# Patient Record
Sex: Male | Born: 1955
Health system: Southern US, Community
[De-identification: ages and names within clinical notes are randomized; demographics above are authoritative.]

## PROBLEM LIST (undated history)

## (undated) DIAGNOSIS — G4730 Sleep apnea, unspecified: Secondary | ICD-10-CM

## (undated) DIAGNOSIS — I517 Cardiomegaly: Secondary | ICD-10-CM

## (undated) DIAGNOSIS — K5730 Diverticulosis of large intestine without perforation or abscess without bleeding: Secondary | ICD-10-CM

## (undated) DIAGNOSIS — M109 Gout, unspecified: Secondary | ICD-10-CM

## (undated) DIAGNOSIS — I739 Peripheral vascular disease, unspecified: Secondary | ICD-10-CM

## (undated) DIAGNOSIS — I779 Disorder of arteries and arterioles, unspecified: Secondary | ICD-10-CM

## (undated) DIAGNOSIS — I10 Essential (primary) hypertension: Secondary | ICD-10-CM

## (undated) DIAGNOSIS — T7840XA Allergy, unspecified, initial encounter: Secondary | ICD-10-CM

## (undated) DIAGNOSIS — J449 Chronic obstructive pulmonary disease, unspecified: Secondary | ICD-10-CM

## (undated) DIAGNOSIS — R9430 Abnormal result of cardiovascular function study, unspecified: Secondary | ICD-10-CM

## (undated) DIAGNOSIS — I77810 Thoracic aortic ectasia: Secondary | ICD-10-CM

## (undated) DIAGNOSIS — I2510 Atherosclerotic heart disease of native coronary artery without angina pectoris: Secondary | ICD-10-CM

## (undated) DIAGNOSIS — M1990 Unspecified osteoarthritis, unspecified site: Secondary | ICD-10-CM

## (undated) DIAGNOSIS — N419 Inflammatory disease of prostate, unspecified: Secondary | ICD-10-CM

## (undated) DIAGNOSIS — E785 Hyperlipidemia, unspecified: Secondary | ICD-10-CM

## (undated) DIAGNOSIS — K219 Gastro-esophageal reflux disease without esophagitis: Secondary | ICD-10-CM

## (undated) DIAGNOSIS — J439 Emphysema, unspecified: Secondary | ICD-10-CM

## (undated) DIAGNOSIS — I5189 Other ill-defined heart diseases: Secondary | ICD-10-CM

## (undated) DIAGNOSIS — I77819 Aortic ectasia, unspecified site: Secondary | ICD-10-CM

## (undated) DIAGNOSIS — F419 Anxiety disorder, unspecified: Secondary | ICD-10-CM

## (undated) HISTORY — DX: Unspecified osteoarthritis, unspecified site: M19.90

## (undated) HISTORY — DX: Emphysema, unspecified: J43.9

## (undated) HISTORY — DX: Allergy, unspecified, initial encounter: T78.40XA

## (undated) HISTORY — PX: JOINT REPLACEMENT: SHX530

## (undated) HISTORY — DX: Hyperlipidemia, unspecified: E78.5

## (undated) HISTORY — DX: Gastro-esophageal reflux disease without esophagitis: K21.9

## (undated) HISTORY — DX: Other ill-defined heart diseases: I51.89

## (undated) HISTORY — PX: KNEE ARTHROSCOPY: SUR90

## (undated) HISTORY — DX: Inflammatory disease of prostate, unspecified: N41.9

## (undated) HISTORY — DX: Sleep apnea, unspecified: G47.30

## (undated) HISTORY — DX: Atherosclerotic heart disease of native coronary artery without angina pectoris: I25.10

## (undated) HISTORY — DX: Essential (primary) hypertension: I10

## (undated) HISTORY — DX: Diverticulosis of large intestine without perforation or abscess without bleeding: K57.30

## (undated) HISTORY — DX: Anxiety disorder, unspecified: F41.9

## (undated) HISTORY — PX: OTHER SURGICAL HISTORY: SHX169

## (undated) HISTORY — DX: Aortic ectasia, unspecified site: I77.819

## (undated) HISTORY — DX: Peripheral vascular disease, unspecified: I73.9

## (undated) HISTORY — DX: Cardiomegaly: I51.7

## (undated) HISTORY — DX: Disorder of arteries and arterioles, unspecified: I77.9

## (undated) HISTORY — DX: Chronic obstructive pulmonary disease, unspecified: J44.9

## (undated) HISTORY — DX: Abnormal result of cardiovascular function study, unspecified: R94.30

## (undated) HISTORY — DX: Thoracic aortic ectasia: I77.810

## (undated) HISTORY — PX: WISDOM TOOTH EXTRACTION: SHX21

---

## 2001-05-02 DIAGNOSIS — E785 Hyperlipidemia, unspecified: Secondary | ICD-10-CM | POA: Insufficient documentation

## 2001-05-02 DIAGNOSIS — I10 Essential (primary) hypertension: Secondary | ICD-10-CM | POA: Insufficient documentation

## 2001-05-29 ENCOUNTER — Ambulatory Visit (HOSPITAL_COMMUNITY): Admission: RE | Admit: 2001-05-29 | Discharge: 2001-05-29 | Payer: Self-pay | Admitting: Cardiology

## 2003-12-31 DIAGNOSIS — K449 Diaphragmatic hernia without obstruction or gangrene: Secondary | ICD-10-CM | POA: Insufficient documentation

## 2003-12-31 DIAGNOSIS — K219 Gastro-esophageal reflux disease without esophagitis: Secondary | ICD-10-CM | POA: Insufficient documentation

## 2003-12-31 DIAGNOSIS — D126 Benign neoplasm of colon, unspecified: Secondary | ICD-10-CM | POA: Insufficient documentation

## 2003-12-31 DIAGNOSIS — K5730 Diverticulosis of large intestine without perforation or abscess without bleeding: Secondary | ICD-10-CM | POA: Insufficient documentation

## 2003-12-31 HISTORY — PX: COLONOSCOPY: SHX174

## 2004-03-09 ENCOUNTER — Ambulatory Visit: Payer: Self-pay | Admitting: Internal Medicine

## 2004-03-20 ENCOUNTER — Ambulatory Visit: Payer: Self-pay | Admitting: Family Medicine

## 2004-07-05 ENCOUNTER — Ambulatory Visit: Payer: Self-pay | Admitting: Cardiology

## 2005-03-28 ENCOUNTER — Ambulatory Visit: Payer: Self-pay | Admitting: Family Medicine

## 2005-05-01 ENCOUNTER — Ambulatory Visit: Payer: Self-pay | Admitting: Internal Medicine

## 2005-05-01 LAB — CONVERTED CEMR LAB: PSA: 0.75 ng/mL

## 2005-07-02 ENCOUNTER — Ambulatory Visit: Payer: Self-pay | Admitting: Family Medicine

## 2005-07-15 ENCOUNTER — Ambulatory Visit: Payer: Self-pay | Admitting: Cardiology

## 2005-08-06 ENCOUNTER — Ambulatory Visit: Payer: Self-pay

## 2005-12-16 ENCOUNTER — Ambulatory Visit: Payer: Self-pay | Admitting: Family Medicine

## 2006-03-07 ENCOUNTER — Ambulatory Visit: Payer: Self-pay | Admitting: Family Medicine

## 2006-05-12 ENCOUNTER — Ambulatory Visit: Payer: Self-pay | Admitting: Family Medicine

## 2006-05-12 LAB — CONVERTED CEMR LAB
Cholesterol: 144 mg/dL (ref 0–200)
LDL Cholesterol: 79 mg/dL (ref 0–99)
Total CHOL/HDL Ratio: 4.2

## 2006-07-15 ENCOUNTER — Ambulatory Visit: Payer: Self-pay | Admitting: Cardiology

## 2006-08-15 ENCOUNTER — Encounter (INDEPENDENT_AMBULATORY_CARE_PROVIDER_SITE_OTHER): Payer: Self-pay | Admitting: Internal Medicine

## 2006-08-15 ENCOUNTER — Encounter: Admission: RE | Admit: 2006-08-15 | Discharge: 2006-08-15 | Payer: Self-pay | Admitting: Allergy and Immunology

## 2007-01-23 ENCOUNTER — Encounter (INDEPENDENT_AMBULATORY_CARE_PROVIDER_SITE_OTHER): Payer: Self-pay | Admitting: *Deleted

## 2007-01-26 ENCOUNTER — Ambulatory Visit: Payer: Self-pay | Admitting: Family Medicine

## 2007-01-26 DIAGNOSIS — T887XXA Unspecified adverse effect of drug or medicament, initial encounter: Secondary | ICD-10-CM | POA: Insufficient documentation

## 2007-02-17 ENCOUNTER — Telehealth (INDEPENDENT_AMBULATORY_CARE_PROVIDER_SITE_OTHER): Payer: Self-pay | Admitting: Internal Medicine

## 2007-03-12 ENCOUNTER — Encounter (INDEPENDENT_AMBULATORY_CARE_PROVIDER_SITE_OTHER): Payer: Self-pay | Admitting: Internal Medicine

## 2007-03-24 ENCOUNTER — Telehealth (INDEPENDENT_AMBULATORY_CARE_PROVIDER_SITE_OTHER): Payer: Self-pay | Admitting: Internal Medicine

## 2007-05-15 ENCOUNTER — Ambulatory Visit: Payer: Self-pay | Admitting: Family Medicine

## 2007-05-15 DIAGNOSIS — J441 Chronic obstructive pulmonary disease with (acute) exacerbation: Secondary | ICD-10-CM | POA: Insufficient documentation

## 2007-05-22 ENCOUNTER — Encounter (INDEPENDENT_AMBULATORY_CARE_PROVIDER_SITE_OTHER): Payer: Self-pay | Admitting: Internal Medicine

## 2007-05-22 DIAGNOSIS — R011 Cardiac murmur, unspecified: Secondary | ICD-10-CM | POA: Insufficient documentation

## 2007-05-22 DIAGNOSIS — Z87448 Personal history of other diseases of urinary system: Secondary | ICD-10-CM | POA: Insufficient documentation

## 2007-05-22 DIAGNOSIS — R1310 Dysphagia, unspecified: Secondary | ICD-10-CM | POA: Insufficient documentation

## 2007-08-06 ENCOUNTER — Encounter (INDEPENDENT_AMBULATORY_CARE_PROVIDER_SITE_OTHER): Payer: Self-pay | Admitting: Internal Medicine

## 2007-08-06 ENCOUNTER — Ambulatory Visit: Payer: Self-pay | Admitting: Family Medicine

## 2007-08-07 LAB — CONVERTED CEMR LAB
ALT: 33 units/L (ref 0–53)
AST: 26 units/L (ref 0–37)
CO2: 30 meq/L (ref 19–32)
Chloride: 106 meq/L (ref 96–112)
Cholesterol: 217 mg/dL (ref 0–200)
Creatinine, Ser: 1 mg/dL (ref 0.4–1.5)
Direct LDL: 139.1 mg/dL
PSA: 0.81 ng/mL (ref 0.10–4.00)
TSH: 1.91 microintl units/mL (ref 0.35–5.50)
Total CHOL/HDL Ratio: 5.8

## 2007-08-18 ENCOUNTER — Ambulatory Visit: Payer: Self-pay | Admitting: Emergency Medicine

## 2007-08-18 DIAGNOSIS — T783XXA Angioneurotic edema, initial encounter: Secondary | ICD-10-CM | POA: Insufficient documentation

## 2007-09-17 ENCOUNTER — Ambulatory Visit: Payer: Self-pay | Admitting: Emergency Medicine

## 2007-10-01 ENCOUNTER — Ambulatory Visit: Payer: Self-pay | Admitting: Cardiology

## 2007-11-03 ENCOUNTER — Ambulatory Visit: Payer: Self-pay | Admitting: Family Medicine

## 2007-11-03 ENCOUNTER — Encounter (INDEPENDENT_AMBULATORY_CARE_PROVIDER_SITE_OTHER): Payer: Self-pay | Admitting: Internal Medicine

## 2007-11-09 LAB — CONVERTED CEMR LAB
AST: 24 units/L (ref 0–37)
Alkaline Phosphatase: 44 units/L (ref 39–117)
Bilirubin, Direct: 0.1 mg/dL (ref 0.0–0.3)
Total Bilirubin: 0.9 mg/dL (ref 0.3–1.2)
Total CHOL/HDL Ratio: 3.7
Triglycerides: 183 mg/dL — ABNORMAL HIGH (ref 0–149)

## 2007-11-16 ENCOUNTER — Encounter: Payer: Self-pay | Admitting: Emergency Medicine

## 2007-11-16 ENCOUNTER — Ambulatory Visit: Payer: Self-pay

## 2007-11-26 ENCOUNTER — Ambulatory Visit: Payer: Self-pay | Admitting: Cardiology

## 2007-12-09 ENCOUNTER — Encounter (INDEPENDENT_AMBULATORY_CARE_PROVIDER_SITE_OTHER): Payer: Self-pay | Admitting: Internal Medicine

## 2008-01-28 ENCOUNTER — Ambulatory Visit: Payer: Self-pay | Admitting: Family Medicine

## 2008-02-15 ENCOUNTER — Ambulatory Visit: Payer: Self-pay | Admitting: Family Medicine

## 2008-03-11 ENCOUNTER — Telehealth (INDEPENDENT_AMBULATORY_CARE_PROVIDER_SITE_OTHER): Payer: Self-pay | Admitting: Internal Medicine

## 2008-11-10 ENCOUNTER — Ambulatory Visit: Payer: Self-pay | Admitting: Family Medicine

## 2008-11-10 DIAGNOSIS — F411 Generalized anxiety disorder: Secondary | ICD-10-CM | POA: Insufficient documentation

## 2008-11-11 LAB — CONVERTED CEMR LAB
AST: 22 units/L (ref 0–37)
BUN: 10 mg/dL (ref 6–23)
CO2: 30 meq/L (ref 19–32)
Calcium: 9.3 mg/dL (ref 8.4–10.5)
Creatinine, Ser: 1 mg/dL (ref 0.4–1.5)
Glucose, Bld: 86 mg/dL (ref 70–99)
LDL Cholesterol: 81 mg/dL (ref 0–99)
PSA: 0.82 ng/mL (ref 0.10–4.00)
Total CHOL/HDL Ratio: 4
VLDL: 36.8 mg/dL (ref 0.0–40.0)

## 2009-01-12 ENCOUNTER — Ambulatory Visit: Payer: Self-pay | Admitting: Family Medicine

## 2009-03-30 ENCOUNTER — Telehealth (INDEPENDENT_AMBULATORY_CARE_PROVIDER_SITE_OTHER): Payer: Self-pay | Admitting: Internal Medicine

## 2009-05-24 ENCOUNTER — Encounter (INDEPENDENT_AMBULATORY_CARE_PROVIDER_SITE_OTHER): Payer: Self-pay | Admitting: *Deleted

## 2009-07-19 ENCOUNTER — Ambulatory Visit: Payer: Self-pay | Admitting: Family Medicine

## 2010-04-17 ENCOUNTER — Encounter: Payer: Self-pay | Admitting: Cardiology

## 2010-04-18 ENCOUNTER — Encounter: Payer: Self-pay | Admitting: Cardiology

## 2010-04-18 ENCOUNTER — Ambulatory Visit
Admission: RE | Admit: 2010-04-18 | Discharge: 2010-04-18 | Payer: Self-pay | Source: Home / Self Care | Attending: Cardiology | Admitting: Cardiology

## 2010-05-01 NOTE — Assessment & Plan Note (Signed)
Summary: 30 MIN/BILLIE'S PT/COUGH X 3 WKS/CLE   Vital Signs:  Patient profile:   55 year old male Height:      68.75 inches Weight:      208.50 pounds BMI:     31.13 Temp:     99.3 degrees F oral Pulse rate:   80 / minute Pulse rhythm:   regular BP sitting:   140 / 72  (left arm) Cuff size:   regular  Vitals Entered By: Linde Gillis CMA Duncan Dull) (July 19, 2009 3:17 PM) CC: cough for three weeks   History of Present Illness: 55 yo with h/o COPD with cough x 3 weeks. Started with runny nose, chills, subjective fever, dry cough. Now has productive, hacking cough, wheezing. Using his albuterol inhaler multiple times a day over last week. Ran out of Spiriva.  No CP, SOB. No sinus pressure or ear pain.  Current Medications (verified): 1)  Metoprolol Tartrate 50 Mg Tabs (Metoprolol Tartrate) .... Take One Tablet By Mouth Twice A Day 2)  Zoloft 50 Mg  Tabs (Sertraline Hcl) .... Take One By Mouth Once A Day 3)  Nitroglycerin 0.4 Mg  Subl (Nitroglycerin) .... As Needed 4)  Zyrtec Allergy 10 Mg  Tabs (Cetirizine Hcl) .[name].. 1 At Bedtime 5)  Spiriva Handihaler 18 Mcg  Caps (Tiotropium Bromide Monohydrate) .[name].. 1 Puff Once Daily 6)  Ventolin Hfa 108 (90 Base) Mcg/act  Aers (Albuterol Sulfate) .... Inhale 2 Puffs Every 4 Hours As Needed 7)  Zocor 40 Mg  Tabs (Simvastatin) .... Take 1 Tablet By Mouth Once A Day 8)  Aspirin 81 Mg  Tabs (Aspirin) .... One Daily 9)  Azithromycin 250 Mg  Tabs (Azithromycin) .... 2 By  Mouth Today and Then 1 Daily For 4 Days 10)  Cheratussin Ac 100-10 Mg/74ml Syrp (Guaifenesin-Codeine) .... 5 Ml At Bedtime As Needed Cough  Allergies: 1)  ! Aspirin 2)  ! Septra (Sulfamethoxazole-Trimethoprim)  Review of Systems      See HPI General:  Complains of chills and fever. CV:  Denies chest pain or discomfort. Resp:  Complains of cough, sputum productive, and wheezing; denies shortness of breath.  Physical Exam  General:  alert, well-developed, well-nourished,  well-hydrated, and overweight-appearing.  NAD Head:  normocephalic and atraumatic Eyes:  pupils equal, pupils round, and no injection.   Ears:  R ear normal and L ear normal.   Nose:  boggy turbinates Mouth:  pharyngeal erythema.   Lungs:  normal respiratory effort, no intercostal retractions, no accessory muscle use,  moist harsh cough with scattered exp wheezes Heart:  normal rate, regular rhythm, and no murmur.   Extremities:  no edema either lower legs Psych:  normally interactive and good eye contact.     Impression & Recommendations:  Problem # 1:  COPD (ICD-496) Assessment Deteriorated Given change in cough, more productive sputum with wheezing and h/o COPD, will treat with Zpack. Refilled Spriva and Albuterol. I would like for him to follow up if no improvement of symptoms in next 5 days sooner if he spikes a temp. Discussed given his history, may need to go to something like Avelox.  His updated medication list for this problem includes:    Spiriva Handihaler 18 Mcg Caps (Tiotropium bromide monohydrate) .[name]... 1 puff once daily    Ventolin Hfa 108 (90 Base) Mcg/act Aers (Albuterol sulfate) ..... Inhale 2 puffs every 4 hours as needed  Complete Medication List: 1)  Metoprolol Tartrate 50 Mg Tabs (Metoprolol tartrate) .... Take one tablet by  mouth twice a day 2)  Zoloft 50 Mg Tabs (Sertraline hcl) .... Take one by mouth once a day 3)  Nitroglycerin 0.4 Mg Subl (Nitroglycerin) .... As needed 4)  Zyrtec Allergy 10 Mg Tabs (Cetirizine hcl) .[name].. 1 at bedtime 5)  Spiriva Handihaler 18 Mcg Caps (Tiotropium bromide monohydrate) .[name].. 1 puff once daily 6)  Ventolin Hfa 108 (90 Base) Mcg/act Aers (Albuterol sulfate) .... Inhale 2 puffs every 4 hours as needed 7)  Zocor 40 Mg Tabs (Simvastatin) .... Take 1 tablet by mouth once a day 8)  Aspirin 81 Mg Tabs (Aspirin) .... One daily 9)  Azithromycin 250 Mg Tabs (Azithromycin) .... 2 by  mouth today and then 1 daily for 4 days 10)   Cheratussin Ac 100-10 Mg/12ml Syrp (Guaifenesin-codeine) .... 5 ml at bedtime as needed cough Prescriptions: CHERATUSSIN AC 100-10 MG/5ML SYRP (GUAIFENESIN-CODEINE) 5 ml at bedtime as needed cough  #4 ounces x 0   Entered and Authorized by:   Ruthe Mannan MD   Signed by:   Ruthe Mannan MD on 07/19/2009   Method used:   Print then Give to Patient   RxID:   [id number] AZITHROMYCIN 250 MG  TABS (AZITHROMYCIN) 2 by  mouth today and then 1 daily for 4 days  #6 x 0   Entered and Authorized by:   Ruthe Mannan MD   Signed by:   Ruthe Mannan MD on 07/19/2009   Method used:   Electronically to        [facility]* (retail)       [address]       [place], [place]  [address]       Ph: [phone]       Fax: [phone]   RxID:   [id number] VENTOLIN HFA 108 (90 BASE) MCG/ACT  AERS (ALBUTEROL SULFATE) inhale 2 puffs every 4 hours as needed  #1 x 11   Entered and Authorized by:   Ruthe Mannan MD   Signed by:   Ruthe Mannan MD on 07/19/2009   Method used:   Electronically to        [facility]* (retail)       [address]       [place], [place]  [address]       Ph: [phone]       Fax: [phone]   RxID:   [id number] SPIRIVA HANDIHALER 18 MCG  CAPS (TIOTROPIUM BROMIDE MONOHYDRATE) 1 puff once daily  #30 x 11   Entered and Authorized by:   Ruthe Mannan MD   Signed by:   Ruthe Mannan MD on 07/19/2009   Method used:   Electronically to        [facility]* (retail)       [address]       [place], [place]  [address]       Ph: [phone]       Fax: [phone]   RxID:   [id number]  ] Current Allergies (reviewed today): ! ASPIRIN ! SEPTRA (SULFAMETHOXAZOLE-TRIMETHOPRIM)  Last TD:  Td (04/21/2001 8:33:02 AM) TD Result Date:  06/21/2009 TD Result:  historical TD Next Due:  10 yr Flex Sig Next Due:  Not Indicated Hemoccult Next Due:  Not Indicated

## 2010-05-01 NOTE — Letter (Signed)
Summary: Appointment - Reminder 2  [facility], Main Office  [address]   [place], [place] [address]   Phone: [phone]  Fax: [phone]     May 24, 2009 MRN: [id number]   Hayden Deleon [address] [place], [place]  [address]   Dear [name],  Our records indicate that it is time to schedule a follow-up appointment with Dr. Myrtis Ser. It is very important that we reach you to schedule this appointment. We look forward to participating in your health care needs. Please contact us at the number listed above at your earliest convenience to schedule your appointment.  If you are unable to make an appointment at this time, give [place] a call so we can update our records.     Sincerely,   Glass blower/designer

## 2010-05-03 ENCOUNTER — Ambulatory Visit (INDEPENDENT_AMBULATORY_CARE_PROVIDER_SITE_OTHER): Payer: BC Managed Care – PPO | Admitting: Family Medicine

## 2010-05-03 ENCOUNTER — Other Ambulatory Visit (INDEPENDENT_AMBULATORY_CARE_PROVIDER_SITE_OTHER): Payer: BC Managed Care – PPO

## 2010-05-03 ENCOUNTER — Encounter (INDEPENDENT_AMBULATORY_CARE_PROVIDER_SITE_OTHER): Payer: BC Managed Care – PPO

## 2010-05-03 ENCOUNTER — Encounter: Payer: Self-pay | Admitting: Family Medicine

## 2010-05-03 ENCOUNTER — Ambulatory Visit: Admit: 2010-05-03 | Payer: Self-pay

## 2010-05-03 ENCOUNTER — Encounter: Payer: Self-pay | Admitting: Cardiology

## 2010-05-03 ENCOUNTER — Other Ambulatory Visit: Payer: Self-pay | Admitting: Cardiology

## 2010-05-03 DIAGNOSIS — H8110 Benign paroxysmal vertigo, unspecified ear: Secondary | ICD-10-CM

## 2010-05-03 DIAGNOSIS — Z0000 Encounter for general adult medical examination without abnormal findings: Secondary | ICD-10-CM

## 2010-05-03 DIAGNOSIS — E782 Mixed hyperlipidemia: Secondary | ICD-10-CM | POA: Insufficient documentation

## 2010-05-03 DIAGNOSIS — R0989 Other specified symptoms and signs involving the circulatory and respiratory systems: Secondary | ICD-10-CM

## 2010-05-03 DIAGNOSIS — I6529 Occlusion and stenosis of unspecified carotid artery: Secondary | ICD-10-CM

## 2010-05-03 LAB — LIPID PANEL
Cholesterol: 170 mg/dL (ref 0–200)
HDL: 41.6 mg/dL
LDL Cholesterol: 98 mg/dL (ref 0–99)
Total CHOL/HDL Ratio: 4
Triglycerides: 154 mg/dL — ABNORMAL HIGH (ref 0.0–149.0)
VLDL: 30.8 mg/dL (ref 0.0–40.0)

## 2010-05-03 LAB — PSA: PSA: 1.06 ng/mL (ref 0.10–4.00)

## 2010-05-03 NOTE — Miscellaneous (Signed)
  Clinical Lists Changes  Observations: Added new observation of PAST MED HX: GERD (05/30/2000) Hyperlipidemia (05/02/2001) Hypertension (05/02/2001) COPD  significant.... Dr. Delton Coombes  Diverticulosis, colon (12/31/2003) Prostatitis CAD   occlusion OM1, good collaterals.. catheterization... 2003  /   nuclear... August, 2009... low risk EF 60%   multiple nuclear scans (04/17/2010 17:38) Added new observation of PRIMARY MD: Hetty Ely (04/17/2010 17:38)       Past History:  Past Medical History: GERD (05/30/2000) Hyperlipidemia (05/02/2001) Hypertension (05/02/2001) COPD  significant.... Dr. Delton Coombes  Diverticulosis, colon (12/31/2003) Prostatitis CAD   occlusion OM1, good collaterals.. catheterization... 2003  /   nuclear... August, 2009... low risk EF 60%   multiple nuclear scans

## 2010-05-03 NOTE — Assessment & Plan Note (Signed)
Summary: follow up/mt  Medications Added METOPROLOL TARTRATE 50 MG TABS (METOPROLOL TARTRATE) Take one tablet by mouth once daily MULTIVITAMINS   TABS (MULTIPLE VITAMIN) once daily VITAMIN C 500 MG  TABS (ASCORBIC ACID) once daily      Allergies Added:   Visit Type:  Follow-up Primary Provider:  Hetty Ely  CC:  CAD.  History of Present Illness: The patient is seen for followup of coronary artery disease.  I saw him last in August, 2009.  He had occlusion of OM one with good collaterals in the catheterization in 2003.  He had an exercise test in 2009 was low risk.  His ejection fraction has been normal during his studies over time.  He has pulmonary disease with chronic shortness of breath.  This has been stable.  He is on cholesterol medications.  His labs have not been checked recently.  He is not having any significant chest pain.  Current Medications (verified): 1)  Metoprolol Tartrate 50 Mg Tabs (Metoprolol Tartrate) .... Take One Tablet By Mouth Once Daily 2)  Zoloft 50 Mg  Tabs (Sertraline Hcl) .... Take One By Mouth Once A Day 3)  Zyrtec Allergy 10 Mg  Tabs (Cetirizine Hcl) .[name].. 1 At Bedtime 4)  Spiriva Handihaler 18 Mcg  Caps (Tiotropium Bromide Monohydrate) .[name].. 1 Puff Once Daily 5)  Zocor 40 Mg  Tabs (Simvastatin) .... Take 1 Tablet By Mouth Once A Day 6)  Aspirin 81 Mg  Tabs (Aspirin) .... One Daily 7)  Multivitamins   Tabs (Multiple Vitamin) .... Once Daily 8)  Vitamin C 500 Mg  Tabs (Ascorbic Acid) .... Once Daily  Allergies (verified): 1)  ! Aspirin 2)  ! Septra  Past History:  Past Medical History: GERD (05/30/2000) Hyperlipidemia (05/02/2001) Hypertension (05/02/2001) COPD  significant.... Dr. Delton Coombes  Diverticulosis, colon (12/31/2003) Prostatitis CAD   occlusion OM1, good collaterals.. catheterization... 2003  /   nuclear... August, 2009... low risk EF 60%   multiple nuclear scans Question soft right carotid bruit  Review of Systems       Patient denies  fever, chills, headache, sweats, rash, change in vision, change in hearing, chest pain, cough, nausea vomiting, urinary symptoms.  All other systems are reviewed and are negative.  Vital Signs:  Patient profile:   55 year old male Height:      68.75 inches Weight:      230 pounds BMI:     34.34 Pulse rate:   71 / minute BP sitting:   126 / 68  (left arm) Cuff size:   regular  Vitals Entered By: Hardin Negus, RMA (April 18, 2010 4:26 PM)  Physical Exam  General:  patient is stable today. Head:  head is atraumatic. Eyes:  no xanthelasma. Neck:  no jugular venous distention.  Question soft right carotid bruit. Chest Wall:  no chest wall tenderness. Lungs:  lungs are clear.  Respiratory effort is nonlabored. Heart:  cardiac exam reveals S1-S2.  There is a 2/6 systolic murmur. Abdomen:  abdomen is soft. Msk:  no musculoskeletal deformities. Extremities:  no peripheral edema. Skin:  no skin rashes. Psych:  patient is oriented to person time and place.  Affect is normal.   Impression & Recommendations:  Problem # 1:  * QUESTION SOFT RIGHT CAROTID BRUIT There is question of a soft right carotid bruit.  The patient has known vascular disease.  He is never had a carotid Doppler.  This will be scheduled.  Problem # 2:  CAD (ICD-414.00)  The following  medications were removed from the medication list:    Nitroglycerin 0.4 Mg Subl (Nitroglycerin) .[name]... As needed His updated medication list for this problem includes:    Metoprolol Tartrate 50 Mg Tabs (Metoprolol tartrate) .[name]... Take one tablet by mouth once daily    Aspirin 81 Mg Tabs (Aspirin) ..... One daily  Orders: EKG w/ Interpretation (93000) Coronary disease appears to be stable at this time.  EKG is done today and reviewed by me.  It is normal.  His last exercise study was done in 2009.  He does not need a followup study at this time.  Problem # 3:  SYSTOLIC MURMUR (ZYS-063.2)  The following medications were removed  from the medication list:    Nitroglycerin 0.4 Mg Subl (Nitroglycerin) .[name]... As needed His updated medication list for this problem includes:    Metoprolol Tartrate 50 Mg Tabs (Metoprolol tartrate) .[name]... Take one tablet by mouth once daily There is a soft systolic murmur.  He does not need an echo.  Problem # 4:  HYPERTENSION (ICD-401.9)  His updated medication list for this problem includes:    Metoprolol Tartrate 50 Mg Tabs (Metoprolol tartrate) .[name]... Take one tablet by mouth once daily    Aspirin 81 Mg Tabs (Aspirin) ..... One daily Blood pressure is controlled.  No change in therapy.  Problem # 5:  HYPERLIPIDEMIA (ICD-272.4)  His updated medication list for this problem includes:    Zocor 40 Mg Tabs (Simvastatin) .[name]... Take 1 tablet by mouth once a day The patient needs a fasting lipid profile.  This will be arranged.  Was seen back in 2 years for cardiology followup will be in touch with him about his lipids and his carotid Doppler.  Other Orders: Carotid Duplex (Carotid Duplex)  Patient Instructions: 1)  Your physician recommends that you return for a FASTING lipid profile: 272.2 2)  Your physician has requested that you have a carotid duplex. This test is an ultrasound of the carotid arteries in your neck. It looks at blood flow through these arteries that supply the brain with blood. Allow one hour for this exam. There are no restrictions or special instructions. 3)  Your physician wants you to follow-up in: 2 years.  You will receive a reminder letter in the mail two months in advance. If you don't receive a letter, please call our office to schedule the follow-up appointment. Prescriptions: ZOCOR 40 MG  TABS (SIMVASTATIN) Take 1 tablet by mouth once a day  #30 x 12   Entered by:   Meredith Staggers, RN   Authorized by:   Talitha Givens, MD, [facility]   Signed by:   Meredith Staggers, RN on 04/18/2010   Method used:   Electronically to        [facility]* (retail)       [address]       [place], [place]  [address]       Ph: [phone]       Fax: [phone]   RxID:   [id number]

## 2010-05-09 NOTE — Assessment & Plan Note (Signed)
Summary: DIZZINESS / LFW   Vital Signs:  Patient profile:   55 year old male Height:      68.75 inches Weight:      227.50 pounds BMI:     33.96 Temp:     98.8 degrees F oral Pulse rate:   71 / minute Pulse rhythm:   regular BP sitting:   130 / 80  (right arm) Cuff size:   regular  Vitals Entered By: Linde Gillis CMA Duncan Dull) (May 03, 2010 12:22 PM) CC: dizziness   History of Present Illness: 55 yo here for several days of dizziness.  Only dizzy when he changes positions.  Particually bad when he is laying in bed and turns his head to one side. He can alleviate the dizziness if he looks forward and closes his eyes, usually resolves within minutes.  Never had anything like this before.  No nausea or vomiting. NO headaches, no CP, no SOB.    Had some URI  congestion last week.  Current Medications (verified): 1)  Metoprolol Tartrate 50 Mg Tabs (Metoprolol Tartrate) .... Take One Tablet By Mouth Once Daily 2)  Zoloft 50 Mg  Tabs (Sertraline Hcl) .... Take One By Mouth Once A Day 3)  Zyrtec Allergy 10 Mg  Tabs (Cetirizine Hcl) .[name].. 1 At Bedtime 4)  Spiriva Handihaler 18 Mcg  Caps (Tiotropium Bromide Monohydrate) .[name].. 1 Puff Once Daily 5)  Zocor 40 Mg  Tabs (Simvastatin) .... Take 1 Tablet By Mouth Once A Day 6)  Aspirin 81 Mg  Tabs (Aspirin) .... One Daily 7)  Multivitamins   Tabs (Multiple Vitamin) .... Once Daily 8)  Vitamin C 500 Mg  Tabs (Ascorbic Acid) .... Once Daily 9)  Meclizine Hcl 25 Mg Tabs (Meclizine Hcl) .[name].. 1 By Mouth Two Times A Day As Needed Vertigo  Allergies: 1)  ! Aspirin 2)  ! Septra  Past History:  Past Medical History: Last updated: 04/18/2010 GERD (05/30/2000) Hyperlipidemia (05/02/2001) Hypertension (05/02/2001) COPD  significant.... Dr. Delton Coombes  Diverticulosis, colon (12/31/2003) Prostatitis CAD   occlusion OM1, good collaterals.. catheterization... 2003  /   nuclear... August, 2009... low risk EF 60%   multiple nuclear scans Question  soft right carotid bruit  Past Surgical History: Last updated: 12/09/2007 persantine cardiolite wnl EF 63% 06/11/2001 CATH-- occlusion OM I good collaterals EF 63%-- 05/29/2001 Adenosine cardiolite --neg ischemia-- EF 68% -- 06/14/2002 Stress test --neg exercise ---08/06/2005 EGD 12/2003 Colonoscopy 10/05 Adenosine nuclear stresstest--11/16/2007---EF 68%--low risk  Family History: Last updated: 08/18/2007 mother-angina  Social History: Last updated: 08/18/2007 Patient states former smoker. Pt quit smoking in 2002.  Smoked x 32 years upto 2ppd. Pt is married, no children. Pt is a Production designer, theatre/television/film at [employer].  Risk Factors: Caffeine Use: 2 (05/15/2007) Exercise: no (05/15/2007)  Risk Factors: Smoking Status: quit (08/18/2007) Passive Smoke Exposure: no (05/15/2007)  Review of Systems      See HPI Eyes:  Denies blurring. GI:  Denies nausea and vomiting.  Physical Exam  General:  alert, well-developed, well-nourished, well-hydrated, and overweight-appearing.  NAD Eyes:  pos dix hallpike Ears:  External ear exam shows no significant lesions or deformities.  Otoscopic examination reveals clear canals, tympanic membranes are intact bilaterally without bulging, retraction, inflammation or discharge. Hearing is grossly normal bilaterally. Lungs:  normal respiratory effort, no intercostal retractions, no accessory muscle use,  moist harsh cough with scattered exp wheezes Heart:  normal rate, regular rhythm, and no murmur.   Neurologic:  No cranial nerve deficits noted. Station  and gait are normal. Plantar reflexes are down-going bilaterally. DTRs are symmetrical throughout. Sensory, motor and coordinative functions appear intact. Psych:  normally interactive and good eye contact.     Impression & Recommendations:  Problem # 1:  BENIGN POSITIONAL VERTIGO (ICD-386.11) Assessment New Discussed causes of vertigo and tx options with pt. Try meclizine, if no improvement  will need referral to ENT in order to rule out more serious causea of vertigo and for possible maneuvers. Pt to call next week with an update. His updated medication list for this problem includes:    Zyrtec Allergy 10 Mg Tabs (Cetirizine hcl) .[name]... 1 at bedtime    Meclizine Hcl 25 Mg Tabs (Meclizine hcl) .[name]... 1 by mouth two times a day as needed vertigo  Complete Medication List: 1)  Metoprolol Tartrate 50 Mg Tabs (Metoprolol tartrate) .... Take one tablet by mouth once daily 2)  Zoloft 50 Mg Tabs (Sertraline hcl) .... Take one by mouth once a day 3)  Zyrtec Allergy 10 Mg Tabs (Cetirizine hcl) .[name].. 1 at bedtime 4)  Spiriva Handihaler 18 Mcg Caps (Tiotropium bromide monohydrate) .[name].. 1 puff once daily 5)  Zocor 40 Mg Tabs (Simvastatin) .... Take 1 tablet by mouth once a day 6)  Aspirin 81 Mg Tabs (Aspirin) .... One daily 7)  Multivitamins Tabs (Multiple vitamin) .... Once daily 8)  Vitamin C 500 Mg Tabs (Ascorbic acid) .... Once daily 9)  Meclizine Hcl 25 Mg Tabs (Meclizine hcl) .[name].. 1 by mouth two times a day as needed vertigo Prescriptions: MECLIZINE HCL 25 MG TABS (MECLIZINE HCL) 1 by mouth two times a day as needed vertigo  #60 x 0   Entered and Authorized by:   Ruthe Mannan MD   Signed by:   Ruthe Mannan MD on 05/03/2010   Method used:   Electronically to        [facility]* (retail)       [address]       [place], [place]  [address]       Ph: [phone]       Fax: [phone]   RxID:   [id number]    Orders Added: 1)  Est. Patient Level IV [02725]    Current Allergies (reviewed today): ! ASPIRIN ! SEPTRA

## 2010-05-10 ENCOUNTER — Telehealth: Payer: Self-pay | Admitting: Cardiology

## 2010-05-17 NOTE — Progress Notes (Signed)
Summary: test results     Medications Added ATORVASTATIN CALCIUM 40 MG TABS (ATORVASTATIN CALCIUM) Take one by mouth daily       Phone Note Outgoing Call   Call placed by: Meredith Staggers, RN,  May 10, 2010 5:19 PM Summary of Call: attempted to call pt w/lab and carotid u/s results, home #busy Left message to call back on work VM  Follow-up for Phone Call        PT RETURNING CALL FOR TEST RESULTS. [phone] Hayden Deleon  May 11, 2010 9:05 AM Left message to call back Dossie Arbour, RN, BSN  May 11, 2010 9:22 AM Pt. given results of carotid.  Pt also given results of lab work. He is willing to start generic Lipitor 40 mg by mouth daily. He is aware to stop Simvastatin and to return week of June 25, 2010 for fasting lab work Follow-up by: Dossie Arbour, RN, BSN,  May 11, 2010 11:07 AM    New/Updated Medications: ATORVASTATIN CALCIUM 40 MG TABS (ATORVASTATIN CALCIUM) Take one by mouth daily Prescriptions: ATORVASTATIN CALCIUM 40 MG TABS (ATORVASTATIN CALCIUM) Take one by mouth daily  #30 x 6   Entered by:   Dossie Arbour, RN, BSN   Authorized by:   Talitha Givens, MD, [facility]   Signed by:   Dossie Arbour, RN, BSN on 05/11/2010   Method used:   Electronically to        [facility]* (retail)       [address]       [place], [place]  [address]       Ph: [phone]       Fax: [phone]   RxID:   [id number]

## 2010-06-25 ENCOUNTER — Other Ambulatory Visit: Payer: BC Managed Care – PPO | Admitting: *Deleted

## 2010-06-25 ENCOUNTER — Other Ambulatory Visit (INDEPENDENT_AMBULATORY_CARE_PROVIDER_SITE_OTHER): Payer: BC Managed Care – PPO

## 2010-06-25 DIAGNOSIS — E7800 Pure hypercholesterolemia, unspecified: Secondary | ICD-10-CM

## 2010-06-25 LAB — HEPATIC FUNCTION PANEL
ALT: 25 U/L (ref 0–53)
AST: 20 U/L (ref 0–37)
Albumin: 4 g/dL (ref 3.5–5.2)
Alkaline Phosphatase: 50 U/L (ref 39–117)
Bilirubin, Direct: 0.1 mg/dL (ref 0.0–0.3)
Total Bilirubin: 0.8 mg/dL (ref 0.3–1.2)
Total Protein: 6.4 g/dL (ref 6.0–8.3)

## 2010-06-25 LAB — LIPID PANEL
Cholesterol: 142 mg/dL (ref 0–200)
HDL: 41.1 mg/dL (ref >39.00)
VLDL: 23.6 mg/dL (ref 0.0–40.0)

## 2010-06-27 ENCOUNTER — Encounter: Payer: Self-pay | Admitting: *Deleted

## 2010-07-12 ENCOUNTER — Ambulatory Visit (INDEPENDENT_AMBULATORY_CARE_PROVIDER_SITE_OTHER): Payer: BC Managed Care – PPO | Admitting: Family Medicine

## 2010-07-12 ENCOUNTER — Encounter: Payer: Self-pay | Admitting: Family Medicine

## 2010-07-12 DIAGNOSIS — J40 Bronchitis, not specified as acute or chronic: Secondary | ICD-10-CM

## 2010-07-12 LAB — HM SIGMOIDOSCOPY

## 2010-07-12 LAB — HM COLONOSCOPY

## 2010-07-12 MED ORDER — MOXIFLOXACIN HCL 400 MG PO TABS: 400.0000 mg | ORAL_TABLET | Freq: Every day | ORAL | Status: AC

## 2010-07-12 NOTE — Progress Notes (Signed)
SUBJECTIVE:  Hayden Deleon is a 54 y.o. male who complains of congestion, sneezing, sore throat, swollen glands, nasal blockage, post nasal drip, night sweats, myalgias, fever and wheezing for 3 days. He denies a history of chest pain and shortness of breath and has a history of asthma. Patient denies smoke cigarettes.   OBJECTIVE: BP 150/80  Pulse 98  Temp(Src) 99.5 F (37.5 C) (Oral)  Ht 5\' 10"  (1.778 m)  Wt 232 lb 1.9 oz (105.289 kg)  BMI 33.31 kg/m2  SpO2 95%  He appears well, vital signs are as noted. Ears normal.  Throat and pharynx normal.  Neck supple. No adenopathy in the neck. Nose is congested. Sinuses non tender. Diffuse exp wheezes, no crackles- pt declines nebs in office.     The PMH, PSH, Social History, Family History, Medications, and allergies have been reviewed in [facility], and have been updated if relevant.  ASSESSMENT:  viral upper respiratory illness and bronchitis  PLAN: Symptomatic therapy suggested: push fluids, rest and return office visit prn if symptoms persist or worsen. Given h/o COPD, will treat with Avelox, albuterol sample given. Call or return to clinic prn if these symptoms worsen or fail to improve as anticipated.

## 2010-07-13 ENCOUNTER — Telehealth: Payer: Self-pay | Admitting: *Deleted

## 2010-07-13 MED ORDER — DOXYCYCLINE HYCLATE 100 MG PO CAPS: 100.0000 mg | ORAL_CAPSULE | Freq: Two times a day (BID) | ORAL | Status: AC

## 2010-07-13 NOTE — Telephone Encounter (Signed)
Doxy x 7 days sent in to [facility]  Any trouble breathing or rash or swelling of throat?  Benadryl for swelling of face, if worsening may need to seek urgent care over weekend.

## 2010-07-13 NOTE — Telephone Encounter (Signed)
Pt has been taking avelox for 2 days.  He states one side of his face has been swollen today.  Per Dr. Sharen Hones advised pt to stop taking avelox and doxycycline would be called in for him.  [facility].

## 2010-07-24 ENCOUNTER — Other Ambulatory Visit: Payer: Self-pay | Admitting: *Deleted

## 2010-07-24 NOTE — Telephone Encounter (Signed)
Patient saw you on 07/12/10 and was given Avelox which he had a reaction to  and it was changed to Doxycycline. Patient finished the antibiotic Sunday and is now feeling that he is getting sick again. Patient complains of chest congestion, chills, but no fever. Patient wants to know if you will send in another rx for the Doxycycline? [facility]  Patient is aware that Dr. Dayton Martes is out of the office this afternoon.

## 2010-07-25 MED ORDER — DOXYCYCLINE HYCLATE 100 MG PO CAPS: 100.0000 mg | ORAL_CAPSULE | Freq: Two times a day (BID) | ORAL | Status: AC

## 2010-07-25 NOTE — Telephone Encounter (Signed)
Patient advised as instructed via telephone. 

## 2010-07-25 NOTE — Telephone Encounter (Signed)
Will send in 5 more days of doxy.

## 2010-08-13 ENCOUNTER — Other Ambulatory Visit: Payer: Self-pay | Admitting: *Deleted

## 2010-08-13 MED ORDER — TIOTROPIUM BROMIDE MONOHYDRATE 18 MCG IN CAPS: 18.0000 ug | ORAL_CAPSULE | Freq: Every day | RESPIRATORY_TRACT | Status: DC

## 2010-08-14 NOTE — Assessment & Plan Note (Signed)
[facility]                            CARDIOLOGY OFFICE NOTE   Hayden Deleon, Hayden Deleon                    MRN:          [id number]  DATE:10/01/2007                            DOB:          October 05, 1955    [name] is here for cardiology followup.  He does note that when  climbing stairs after approximately two flights, he does get some  shortness of breath and he may have chest tightness.  He is not having  symptoms at any other time.  He has documented coronary disease.  He has  left-to-left collaterals with disease of first OM and 60%-70% right  coronary lesion.  His Myoview scan in 2007 showed good ejection fraction  and no ischemia.  When we tried to walk him on the treadmill in the  past, he did not get a good heart rate response and therefore adenosine  will be used again.   The patient has COPD and he is being followed by Dr. Levy Pupa.   Also, the patient has had swelling including tongue swelling and  ultimately he saw allergy.  There has been no documentation of any type  of definite allergen causing the problems.  Zyrtec seems to help him.  We had stopped his cholesterol med at that time.  He can be restarted on  Zocor.  He had labs recently through [employer] and we will obtain  these.   ALLERGIES:  1. The patient had upset stomach from ASPIRIN in the past, but he      tolerates low-dose aspirin.  2. Question of SULFA.   MEDICATIONS:  1. Lopressor 50.  2. Aspirin 81.  3. Zoloft 50.  4. Vistaril as needed.  5. Zyrtec at nighttime.   OTHER MEDICAL PROBLEMS:  See the list below.   REVIEW OF SYSTEMS:  He is feeling well and doing well.  As mentioned,  per the HPI, he has some shortness of breath.  Otherwise, his review of  systems is negative.   PHYSICAL EXAMINATION:  VITAL SIGNS:  Weight is 224 pounds.  This is up a  few pounds.  He knows that he needs to lose weight and we talked about  this.  Blood pressure is 104/64 with  a pulse of 54.  GENERAL:  The patient is oriented to person, time, and place.  Affect is  normal.  HEENT:  Reveals no xanthelasma.  He has normal extraocular motion.  NECK:  There are no carotid bruits.  There is no jugular venous  distention.  LUNGS:  Clear.  Respiratory effort is not labored.  CARDIAC:  Exam reveals S1 and S2.  There are no clicks or significant  murmurs.  ABDOMEN:  Obese but soft.  EXTREMITIES:  He has no peripheral edema.  MUSCULOSKELETAL:  There are no musculoskeletal deformities.   EKG reveals no significant change.   PROBLEMS:  1. Hypertension.  Blood pressure is good today.  2. History of prostatitis.  3. Gastroesophageal reflux disease.  4. Question of SULFA allergy.  5. Hyperlipidemia.  We definitely need to get him back on simvastatin,  which will be done and then he will have followup labs.  6. Question of a soft systolic murmur in the past.  I do not hear      significant murmurs today.  7. Coronary disease.  The patient is having some exertional shortness      of breath.  This may be anginal.  We need to proceed with an      adenosine Myoview and then see him back to discuss the findings      further.  We will not have him walk on the treadmill because he did      not increase his heart rate adequately by history.  I am not      changing his cardiac medications as of today.  8. History of significant chronic obstructive pulmonary disease      followed by Dr. Levy Pupa.  9. History of some type of allergic reaction with swelling in his      tongue.  Etiology is not clear.  There had been no proven antigens.      Some Zyrtec at night seems to help him.  I feel it is safe for [place]      to put him back on Zocor.     Luis Abed, MD, [facility]  Electronically Signed    JDK/MedQ  DD: 10/01/2007  DT: 10/02/2007  Job #: [id number]   cc:   Billie D. Bean, FNP  Leslye Peer, MD

## 2010-08-14 NOTE — Assessment & Plan Note (Signed)
[facility]                            CARDIOLOGY OFFICE NOTE   Hayden Deleon, Hayden Deleon                    MRN:          [id number]  DATE:11/26/2007                            DOB:          13-Dec-1955    Mr. Hayden Deleon is seen for followup.  See my note of October 01, 2007.  At that  time, he was having some exertional shortness of breath.  We decided to  proceed with a Myoview scan since he does have history of coronary  artery disease.  He had an adenosine Myoview scan.  It showed question  of an old scar at the base the inferior wall, but no definite ischemia.  This is stable for him.  I think it is most likely that his shortness is  pulmonary.  He is overweight and he knows it and as of today, he does  seem motivated to try to start bringing it down.   PAST MEDICAL HISTORY:   ALLERGIES:  He has had stomach upset from ASPIRIN in the past, but he  tolerates low-dose aspirin.  There is question of a SULFA allergy.   MEDICATIONS:  1. Simvastatin 40.  2. Lopressor 50.  3. Aspirin 81.  4. Zoloft 50.  5. Zyrtec 10.   OTHER MEDICAL PROBLEMS:  See the complete list on my note of October 01, 2007.   REVIEW OF SYSTEMS:  He feels fine today and his review of systems is  negative.   PHYSICAL EXAMINATION:  VITAL SIGNS:  Weight is 229 pounds.  Blood  pressure 114/62 with a pulse of 66.  GENERAL:  The patient is oriented to person, time, and place.  Affect is  normal.  HEENT:  No xanthelasma.  He has normal extraocular motion.  There are no  carotid bruits.  There is no jugular venous distention.  LUNGS:  Clear.  Respiratory effort is not labored.  CARDIAC:  S1 and S2.  There is no clicks or significant murmurs.  ABDOMEN:  Soft.  There is no peripheral edema.   No other labs were done today.  As outlined, he had a Myoview scan since  his last visit.   PROBLEMS:  Listed on my note of October 01, 2007:  #7.  Coronary disease.  This is stable.  His exertional  shortness of  breath is probably not ischemic in origin.  I will see him back in 1  year.    Luis Abed, MD, [facility]  Electronically Signed   JDK/MedQ  DD: 11/26/2007  DT: 11/27/2007  Job #: [id number]

## 2010-08-17 NOTE — Cardiovascular Report (Signed)
This transcription did not belong to this patient.  Please disregard.

## 2010-08-17 NOTE — Cardiovascular Report (Signed)
[place]  Patient:    Hayden Deleon, Hayden Deleon Visit Number: [id number] MRN: [id number]          Service Type: CAT Location: [facility] 2853 [id number] Attending Physician:  Rollene Rotunda Dictated by:   Rollene Rotunda, M.D. [facility] Proc. Date: 05/29/01 Admit Date:  05/29/2001   CC:         Laurita Quint, M.D. [facility]  Luis Abed, M.D. [facility]   Cardiac Catheterization  DATE OF BIRTH:  03-13-1956  PROCEDURE:  Left heart catheterization/coronary arteriography.  CARDIOLOGIST:  Rollene Rotunda, M.D. [facility]  INDICATION:  Evaluate patient with exertional angina.  PROCEDURAL NOTE:  Left heart catheterization was performed via the right femoral artery.  The artery was cannulated using anterior wall puncture.  A #6 French arterial sheath was inserted via the modified Seldinger technique. Preformed Judkins and a pigtail catheter were utilized.  The patient tolerated the procedure well and left the lab in stable condition.  RESULTS:  HEMODYNAMICS:  LV: 90/21, AO: 87/53.  CORONARIES:  The left main was normal.  Left anterior descending artery:  The LAD had a long proximal 25% stenosis involving a second diagonal.  The second diagonal was a moderate size vessel with ostial 40% stenosis.  The circumflex in the A-V groove had scattered 25% lesions and a 40% at the takeoff of a first marginal.  The first marginal was occluded and seen to fill via left-to-left collaterals.  The second marginal was large with luminal irregularities.  The right coronary artery was a large dominant vessel.  There was a 60 to 70% stenosis in the proximal segment followed by a 60% stenosis before the RV branch and long 40% stenosis after the RV branch.  There was long 60% stenosis before the PDA.  This vessel was looked at in multiple views before and after nitrates.  LEFT VENTRICULOGRAM:  Left ventriculogram was obtained in the RAO projection. The EF was 65% with normal wall  motion.  CONCLUSION: 1. Severe single-vessel coronary artery disease with apparent nonobstructive    disease elsewhere. 2. Normal left ventriculogram.  PLAN:  The patient will be managed aggressively with secondary risk factor modification.  He will need a perfusion study to further evaluate the hemodynamic significance of the RCA. Dictated by:   Rollene Rotunda, M.D. [facility] Attending Physician:  Rollene Rotunda DD:  05/29/01 TD:  05/29/01 Job: [id number]

## 2010-08-17 NOTE — Assessment & Plan Note (Signed)
[facility]                            CARDIOLOGY OFFICE NOTE   TILMAN, MCCLAREN                    MRN:          [id number]  DATE:07/15/2006                            DOB:          [birth year]    Hayden Deleon is seen for cardiology followup. He is not having any  significant chest pain. He is not having any significant shortness of  breath. It is of note, however, that he is having some type of allergic  reaction that is very concerning. At first, he started having areas of  swelling with itching on various places on his feet. More recently, he  has had several episodes of tongue swelling, both his entire tongue and  half of his tongue. He was given Vistaril by a primary physician. He has  had some shortness of breath with the tongue swelling.   He is on Altace and has been on Altace since 2007. However, he could  still be allergic to this and it is being stopped immediately.   PAST MEDICAL HISTORY:   ALLERGIES:  QUESTION OF ALLERGY TO SULFA AND NEW ALLERGIES THAT ARE  UNEXPLAINED.   MEDICATIONS:  1. Lopressor 50.  2. Aspirin 81.  3. Zoloft 50 (to be stopped today).  4. Zocor (to be stopped today).  5. P.r.n. Vistaril.   OTHER MEDICAL PROBLEMS:  See the list below.   REVIEW OF SYSTEMS:  He really is not having any other significant  symptoms. His review of systems otherwise is negative.   PHYSICAL EXAMINATION:  The patient weighs 216 pounds. Blood pressure is  105/76. His pulse is 58. The patient is oriented to person, time and  place. Affect is normal.  HEENT: Reveals no xanthelasma. He has normal extraocular motion. He has  no carotid bruits. There is no jugular venous distention.  LUNGS:  Are clear. Respiratory effort is not labored.  CARDIAC: Reveals an S1, with an S2. There are no clicks or significant  murmurs.  ABDOMEN: Soft. He has  no masses or bruits.  He has no significant peripheral edema.   EKG is normal with some  mild sinus bradycardia.   PROBLEMS:  1. Hypertension. His blood pressure is actually on the low side today.      I am very comfortable stopping his Altace and following him for a      period of time.  2. History of prostatitis.  3. Gastroesophageal reflux disease.  4. SULFA ALLERGY.  5. Hyperlipidemia.  6. History of a soft systolic murmur in the past.  7. Coronary artery disease. The patient has left-to-left collaterals      with disease of the first obtuse marginal. He has a 60% to 70%      right coronary lesion. He has a Myoview scan done in 2007, that      showed no significant abnormalities and a  good ejection fraction.  8. History of some lung disease and he is treated with Spiriva.  9. Recent rash in the feet and tongue swelling. This is very      concerning and I am pushing to get  him Allergy evaluation very soon      paralleling our stopping his Altace and Zocor.   I will see him back for followup.     Luis Abed, MD, [facility]  Electronically Signed    JDK/MedQ  DD: 07/15/2006  DT: 07/15/2006  Job #: [id number]   cc:   Willaim Sheng D. Bean, FNP  Clinton D. Maple Hudson, MD, FCCP, FACP

## 2010-10-05 ENCOUNTER — Encounter: Payer: Self-pay | Admitting: Cardiology

## 2010-10-17 ENCOUNTER — Encounter (INDEPENDENT_AMBULATORY_CARE_PROVIDER_SITE_OTHER): Payer: Self-pay | Admitting: General Surgery

## 2010-10-17 ENCOUNTER — Encounter (INDEPENDENT_AMBULATORY_CARE_PROVIDER_SITE_OTHER): Payer: Self-pay | Admitting: Surgery

## 2010-10-17 ENCOUNTER — Other Ambulatory Visit: Payer: Self-pay | Admitting: *Deleted

## 2010-10-17 ENCOUNTER — Ambulatory Visit (INDEPENDENT_AMBULATORY_CARE_PROVIDER_SITE_OTHER): Payer: BC Managed Care – PPO | Admitting: Surgery

## 2010-10-17 VITALS — BP 156/92 | HR 68 | Temp 98.0°F | Ht 70.0 in | Wt 235.0 lb

## 2010-10-17 DIAGNOSIS — K4090 Unilateral inguinal hernia, without obstruction or gangrene, not specified as recurrent: Secondary | ICD-10-CM

## 2010-10-17 MED ORDER — TIOTROPIUM BROMIDE MONOHYDRATE 18 MCG IN CAPS: 18.0000 ug | ORAL_CAPSULE | Freq: Every day | RESPIRATORY_TRACT | Status: DC

## 2010-10-17 NOTE — Progress Notes (Signed)
The patient comes in today because of a right inguinal hernia. He's had this for some time and he denies any symptoms. Occasionally will get a twinge of pain. He denies any symptoms of obstruction. He does have COPD and coughs quite a bit. He does not travel internationally.  On physical exam he has a prominent bulge in a moderately overweight lower abdominal pannus. This is easily reducible. There is no left inguinal hernia.  I gave him a hernia book and told him that if he decides to have this repaired and I would do an open right inguinal hernia repair with mesh. I gave him a booklet on this and showed him how we would do it. He will decide if he wants to have this done. He may want to schedule her for late this year. I told him to call the office and schedule this. I would schedule him as an outpatient [facility] surgery under general anesthesia.

## 2011-02-27 ENCOUNTER — Other Ambulatory Visit: Payer: Self-pay | Admitting: Cardiology

## 2011-02-27 MED ORDER — METOPROLOL TARTRATE 50 MG PO TABS: 50.0000 mg | ORAL_TABLET | Freq: Two times a day (BID) | ORAL | Status: DC

## 2011-05-06 ENCOUNTER — Other Ambulatory Visit: Payer: Self-pay | Admitting: *Deleted

## 2011-05-06 MED ORDER — SERTRALINE HCL 50 MG PO TABS: 50.0000 mg | ORAL_TABLET | Freq: Every day | ORAL | Status: DC

## 2011-06-14 ENCOUNTER — Ambulatory Visit (INDEPENDENT_AMBULATORY_CARE_PROVIDER_SITE_OTHER): Payer: BC Managed Care – PPO | Admitting: Family Medicine

## 2011-06-14 ENCOUNTER — Encounter: Payer: Self-pay | Admitting: Family Medicine

## 2011-06-14 VITALS — BP 140/80 | HR 87 | Temp 98.1°F | Ht 70.0 in | Wt 236.0 lb

## 2011-06-14 DIAGNOSIS — J449 Chronic obstructive pulmonary disease, unspecified: Secondary | ICD-10-CM

## 2011-06-14 MED ORDER — ALBUTEROL SULFATE HFA 108 (90 BASE) MCG/ACT IN AERS: 2.0000 | INHALATION_SPRAY | Freq: Four times a day (QID) | RESPIRATORY_TRACT | Status: DC | PRN

## 2011-06-14 MED ORDER — DOXYCYCLINE HYCLATE 100 MG PO TABS: 100.0000 mg | ORAL_TABLET | Freq: Two times a day (BID) | ORAL | Status: AC

## 2011-06-14 NOTE — Progress Notes (Signed)
SUBJECTIVE:  Hayden Deleon is a 56 y.o. male who complains of congestion, sneezing, sore throat, swollen glands, nasal blockage, post nasal drip, night sweats, myalgias, fever and wheezing for 3 days. He denies a history of chest pain and shortness of breath and has a history of asthma. Patient denies smoke cigarettes.  Had an allergic rx to avelox last time- facial swelling.   Patient Active Problem List  Diagnoses  . COLONIC POLYPS  . HYPERLIPIDEMIA  . ANXIETY  . HYPERTENSION  . CAD  . COPD  . GASTROESOPHAGEAL REFLUX DISEASE, CHRONIC  . HIATAL HERNIA  . DIVERTICULOSIS, COLON  . SYSTOLIC MURMUR  . DYSPHAGIA UNSPECIFIED  . ANGIOEDEMA  . ADVEF, DRUG/MEDICINAL/BIOLOGICAL SUBST NOS  . PROSTATITIS, HX OF  . MIXED HYPERLIPIDEMIA  . BENIGN POSITIONAL VERTIGO   Past Medical History  Diagnosis Date  . Hyperlipidemia   . Hypertension   . COPD (chronic obstructive pulmonary disease)   . Diverticulosis of colon   . Prostatitis   . Blockage of coronary artery of heart     70%   Past Surgical History  Procedure Date  . Knee arthroscopy     right   History  Substance Use Topics  . Smoking status: Former Games developer  . Smokeless tobacco: Former Neurosurgeon  . Alcohol Use: No   Family History  Problem Relation Age of Onset  . Angina Mother    Allergies  Allergen Reactions  . Aspirin     REACTION: Nausea; fine with 81mg   . Sulfamethoxazole W/Trimethoprim     REACTION: sore mouth   Current Outpatient Prescriptions on File Prior to Visit  Medication Sig Dispense Refill  . Ascorbic Acid (VITAMIN C) 500 MG tablet Take 500 mg by mouth daily.        [name] aspirin 81 MG tablet Take 81 mg by mouth daily.        [name] atorvastatin (LIPITOR) 40 MG tablet Take 40 mg by mouth daily.        . cetirizine (ZYRTEC) 10 MG tablet Take 10 mg by mouth at bedtime.        . metoprolol (LOPRESSOR) 50 MG tablet Take 1 tablet (50 mg total) by mouth 2 (two) times daily.  60 tablet  6  . Multiple Vitamin  (MULTIVITAMIN) capsule Take 1 capsule by mouth daily.        . sertraline (ZOLOFT) 50 MG tablet Take 1 tablet (50 mg total) by mouth daily.  30 tablet  6  . tiotropium (SPIRIVA) 18 MCG inhalation capsule Place 1 capsule (18 mcg total) into inhaler and inhale daily.  30 capsule  6  . meclizine (ANTIVERT) 25 MG tablet Take 25 mg by mouth 2 (two) times daily as needed.         The PMH, PSH, Social History, Family History, Medications, and allergies have been reviewed in [facility], and have been updated if relevant.  OBJECTIVE: BP 140/80  Pulse 87  Temp(Src) 98.1 F (36.7 C) (Oral)  Ht 5\' 10"  (1.778 m)  Wt 236 lb (107.049 kg)  BMI 33.86 kg/m2  SpO2 93%  He appears well, vital signs are as noted. Ears normal.  Throat and pharynx normal.  Neck supple. No adenopathy in the neck. Nose is congested. Sinuses non tender. Diffuse exp wheezes, no crackles-left>right,  pt declines nebs in office.     The PMH, PSH, Social History, Family History, Medications, and allergies have been reviewed in [facility], and have been updated if relevant.  ASSESSMENT:  viral  upper respiratory illness and bronchitis  PLAN: Symptomatic therapy suggested: push fluids, rest and return office visit prn if symptoms persist or worsen. Given h/o COPD, Doxycycline x 10 days, albuterol refilled. Call or return to clinic prn if these symptoms worsen or fail to improve as anticipated.

## 2011-08-05 ENCOUNTER — Other Ambulatory Visit: Payer: Self-pay | Admitting: *Deleted

## 2011-08-05 MED ORDER — ATORVASTATIN CALCIUM 40 MG PO TABS: 40.0000 mg | ORAL_TABLET | Freq: Every day | ORAL | Status: DC

## 2011-08-05 NOTE — Telephone Encounter (Signed)
Refilled atorvastatin

## 2011-09-12 ENCOUNTER — Other Ambulatory Visit: Payer: Self-pay | Admitting: Cardiology

## 2011-09-12 DIAGNOSIS — I6529 Occlusion and stenosis of unspecified carotid artery: Secondary | ICD-10-CM

## 2011-09-13 ENCOUNTER — Encounter: Payer: Self-pay | Admitting: Cardiology

## 2011-09-13 DIAGNOSIS — J449 Chronic obstructive pulmonary disease, unspecified: Secondary | ICD-10-CM | POA: Insufficient documentation

## 2011-09-13 DIAGNOSIS — K219 Gastro-esophageal reflux disease without esophagitis: Secondary | ICD-10-CM | POA: Insufficient documentation

## 2011-09-13 DIAGNOSIS — I779 Disorder of arteries and arterioles, unspecified: Secondary | ICD-10-CM | POA: Insufficient documentation

## 2011-09-13 DIAGNOSIS — I739 Peripheral vascular disease, unspecified: Secondary | ICD-10-CM

## 2011-09-13 DIAGNOSIS — IMO0002 Reserved for concepts with insufficient information to code with codable children: Secondary | ICD-10-CM | POA: Insufficient documentation

## 2011-09-13 DIAGNOSIS — R9430 Abnormal result of cardiovascular function study, unspecified: Secondary | ICD-10-CM | POA: Insufficient documentation

## 2011-09-13 DIAGNOSIS — I2510 Atherosclerotic heart disease of native coronary artery without angina pectoris: Secondary | ICD-10-CM | POA: Insufficient documentation

## 2011-09-16 ENCOUNTER — Ambulatory Visit (INDEPENDENT_AMBULATORY_CARE_PROVIDER_SITE_OTHER): Payer: BC Managed Care – PPO | Admitting: Cardiology

## 2011-09-16 ENCOUNTER — Encounter (INDEPENDENT_AMBULATORY_CARE_PROVIDER_SITE_OTHER): Payer: BC Managed Care – PPO

## 2011-09-16 ENCOUNTER — Encounter: Payer: Self-pay | Admitting: Cardiology

## 2011-09-16 VITALS — BP 142/70 | HR 72 | Resp 12 | Ht 70.0 in | Wt 235.4 lb

## 2011-09-16 DIAGNOSIS — I6529 Occlusion and stenosis of unspecified carotid artery: Secondary | ICD-10-CM

## 2011-09-16 DIAGNOSIS — I2510 Atherosclerotic heart disease of native coronary artery without angina pectoris: Secondary | ICD-10-CM

## 2011-09-16 DIAGNOSIS — R0989 Other specified symptoms and signs involving the circulatory and respiratory systems: Secondary | ICD-10-CM

## 2011-09-16 DIAGNOSIS — I10 Essential (primary) hypertension: Secondary | ICD-10-CM

## 2011-09-16 DIAGNOSIS — E785 Hyperlipidemia, unspecified: Secondary | ICD-10-CM

## 2011-09-16 DIAGNOSIS — I779 Disorder of arteries and arterioles, unspecified: Secondary | ICD-10-CM

## 2011-09-16 MED ORDER — METOPROLOL TARTRATE 50 MG PO TABS: ORAL_TABLET | ORAL | Status: DC

## 2011-09-16 NOTE — Assessment & Plan Note (Signed)
The patient has had his lipids checked at work but not in our office within the year. He will be sending [place] the labs from work. He says that his lipids were excellent. He is on Lipitor 40 daily. This is new since last year. At that time he was on simvastatin 40 and his LDL was 72. No change in therapy.

## 2011-09-16 NOTE — Assessment & Plan Note (Signed)
Blood pressure is controlled. No change in therapy. 

## 2011-09-16 NOTE — Assessment & Plan Note (Signed)
Carotid Dopplers were done today. There has been some progression since the past. Followup is planned. We are pushing harder on his secondary prevention. However the only remaining issue is that of his weight. He does not smoke. He is nondiabetic. We are treating his cholesterol. His blood pressures controlled. I plan to see him back in 6 months.

## 2011-09-16 NOTE — Progress Notes (Signed)
HPI   The patient is seen for cardiology followup. I saw him last January, 2012. As part of today's evaluation I have reviewed all of his past cardiac history. I have updated the new electronic medical record. The patient has known coronary disease. Catheterization in 2003 revealed occlusion of OM 1 with good collaterals. Nuclear scan in August, 2009 was low risk. Ejection fraction was 60%.  The patient is active. He does have some generalized fatigue. He does that with rapid walking upstairs he can get some chest tightness. He stops and this is gone. This has been stable. There has been no change in the pattern. He has no rest pain. He is taking his medications including his lipid meds.    Allergies  Allergen Reactions  . Aspirin     REACTION: Nausea; fine with 81mg   . Avelox (Moxifloxacin Hcl In Nacl)     Facial swelling  . Sulfamethoxazole W-Trimethoprim     REACTION: sore mouth    Current Outpatient Prescriptions  Medication Sig Dispense Refill  . albuterol (PROVENTIL HFA;VENTOLIN HFA) 108 (90 BASE) MCG/ACT inhaler Inhale 2 puffs into the lungs every 6 (six) hours as needed for wheezing.  1 Inhaler  0  . aspirin 81 MG tablet Take 81 mg by mouth daily.        [name] atorvastatin (LIPITOR) 40 MG tablet Take 1 tablet (40 mg total) by mouth daily.  30 tablet  1  . cetirizine (ZYRTEC) 10 MG tablet Take 10 mg by mouth at bedtime.        . metoprolol (LOPRESSOR) 50 MG tablet Take 1 tablet (50 mg total) by mouth 2 (two) times daily.  60 tablet  6  . Multiple Vitamin (MULTIVITAMIN) capsule Take 1 capsule by mouth daily.        . sertraline (ZOLOFT) 50 MG tablet Take 1 tablet (50 mg total) by mouth daily.  30 tablet  6  . tiotropium (SPIRIVA) 18 MCG inhalation capsule Place 1 capsule (18 mcg total) into inhaler and inhale daily.  30 capsule  6  . Ascorbic Acid (VITAMIN C) 500 MG tablet Take 500 mg by mouth daily.        . meclizine (ANTIVERT) 25 MG tablet Take 25 mg by mouth 2 (two) times daily as  needed.          History   Social History  . Marital Status: Married    Spouse Name: N/A    Number of Children: 0  . Years of Education: N/A   Occupational History  . 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 Smoking status: Former Games developer  . Smokeless tobacco: Former Neurosurgeon  . Alcohol Use: No  . Drug Use: No  . Sexually Active:    Other Topics Concern  . Not on file   Social History Narrative  . No narrative on file    Family History  Problem Relation Age of Onset  . Angina Mother     Past Medical History  Diagnosis Date  . Hyperlipidemia   . Hypertension   . COPD (chronic obstructive pulmonary disease)     Significant Dr. Delton Coombes  . Diverticulosis of colon   . Prostatitis   . CAD (coronary artery disease)     Catheterization, 2003, occluded OM1, good collaterals  //   nuclear, August, 2009, low risk, EF 60%  . GERD (gastroesophageal reflux disease)   . Carotid artery disease     Doppler, February, 2012,  0-39% R. ICA, 40-59% LICA,  plan followup 1 year  . Ejection fraction     EF 60%, nuclear, 2009    Past Surgical History  Procedure Date  . Knee arthroscopy     right    ROS  Patient denies fever, chills, headache, sweats, rash, change in vision, change in hearing, cough, nausea vomiting, urinary symptoms. All the systems are reviewed and are negative.  PHYSICAL EXAM Patient is overweight. He stable. He is oriented to person time and place. Affect is normal. There is no jugular venous distention. Lungs are clear. Respiratory effort is nonlabored. Cardiac exam reveals S1 and S2. There no clicks or significant murmurs. The abdomen is soft. There's no peripheral edema. There no musculoskeletal deformities. There are no skin rashes.  Filed Vitals:   09/16/11 1415  BP: 142/70  Pulse: 72  Resp: 12  Height: 5\' 10"  (1.778 m)  Weight: 235 lb 6.4 oz (106.777 kg)   EKG is done today and reviewed by me. There is normal sinus rhythm.  There is no significant abnormality in the EKG.  ASSESSMENT & PLAN

## 2011-09-16 NOTE — Assessment & Plan Note (Signed)
The patient's last nuclear scan was August, 2009. There was no marked ischemia. He does describe some exertional angina at this time. It is reproducible at a certain level going up stairs. It has not changed over time. I recommended to him that we increase his beta blocker dose in the morning. He will see how he feels with this. If this helps we'll keep the dose at a higher level.

## 2011-09-16 NOTE — Patient Instructions (Addendum)
Your physician has recommended you make the following change in your medication: increase your lopressor to 75mg  every morning and 50mg  every pm.  Your physician wants you to follow-up in: 6 months.  You will receive a reminder letter in the mail two months in advance. If you don't receive a letter, please call our office to schedule the follow-up appointment.

## 2011-10-22 ENCOUNTER — Other Ambulatory Visit: Payer: Self-pay | Admitting: *Deleted

## 2011-10-22 MED ORDER — TIOTROPIUM BROMIDE MONOHYDRATE 18 MCG IN CAPS: 18.0000 ug | ORAL_CAPSULE | Freq: Every day | RESPIRATORY_TRACT | Status: DC

## 2011-12-20 ENCOUNTER — Other Ambulatory Visit: Payer: Self-pay | Admitting: *Deleted

## 2011-12-20 MED ORDER — ATORVASTATIN CALCIUM 40 MG PO TABS: 40.0000 mg | ORAL_TABLET | Freq: Every day | ORAL | Status: DC

## 2012-03-09 ENCOUNTER — Encounter: Payer: Self-pay | Admitting: *Deleted

## 2012-03-09 ENCOUNTER — Encounter: Payer: Self-pay | Admitting: Family Medicine

## 2012-03-09 ENCOUNTER — Ambulatory Visit (INDEPENDENT_AMBULATORY_CARE_PROVIDER_SITE_OTHER): Payer: BC Managed Care – PPO | Admitting: Family Medicine

## 2012-03-09 VITALS — BP 144/80 | HR 60 | Temp 98.1°F | Wt 233.0 lb

## 2012-03-09 DIAGNOSIS — Z125 Encounter for screening for malignant neoplasm of prostate: Secondary | ICD-10-CM

## 2012-03-09 DIAGNOSIS — Z23 Encounter for immunization: Secondary | ICD-10-CM

## 2012-03-09 DIAGNOSIS — E785 Hyperlipidemia, unspecified: Secondary | ICD-10-CM

## 2012-03-09 DIAGNOSIS — R0609 Other forms of dyspnea: Secondary | ICD-10-CM

## 2012-03-09 DIAGNOSIS — I2510 Atherosclerotic heart disease of native coronary artery without angina pectoris: Secondary | ICD-10-CM

## 2012-03-09 DIAGNOSIS — R0683 Snoring: Secondary | ICD-10-CM

## 2012-03-09 LAB — LIPID PANEL
HDL: 36.6 mg/dL — ABNORMAL LOW (ref >39.00)
Total CHOL/HDL Ratio: 3
VLDL: 22.8 mg/dL (ref 0.0–40.0)

## 2012-03-09 LAB — COMPREHENSIVE METABOLIC PANEL
AST: 18 U/L (ref 0–37)
Albumin: 3.9 g/dL (ref 3.5–5.2)
Alkaline Phosphatase: 52 U/L (ref 39–117)
BUN: 10 mg/dL (ref 6–23)
Creatinine, Ser: 1 mg/dL (ref 0.4–1.5)
Potassium: 4.5 mEq/L (ref 3.5–5.1)

## 2012-03-09 NOTE — Progress Notes (Signed)
Very pleasant 56 yo male with h/o CAD (followed by Dr. Myrtis Ser), HLD on atorvastatin here to discuss:  HLD-  Lab Results  Component Value Date   CHOL 142 06/25/2010   HDL 41.10 06/25/2010   LDLCALC 77 06/25/2010   LDLDIRECT 139.1 08/06/2007   TRIG 118.0 06/25/2010   CHOLHDL 3 06/25/2010   CAD- followed by Dr. Myrtis Ser who he last saw this summer.  Non smoker, non diabetic and he is active.  Cholesterol has been treated aggressively.  ? Check PSA-  Having no symptoms of difficulty starting or stopping his stream.  No family h/o prostate CA.  Has not been checked in a awhile.  Lab Results  Component Value Date   PSA 1.06 05/03/2010   PSA 0.82 11/10/2008   PSA 0.81 08/06/2007   ? Sleep apnea- wife says he snores and sometimes stops breathing in his sleep.  Does have h/o COPD- on spiriva and as needed albuterol. No CP or worsening SOB.     Allergies  Allergen Reactions  . Aspirin     REACTION: Nausea; fine with 81mg   . Avelox (Moxifloxacin Hcl In Nacl)     Facial swelling  . Sulfamethoxazole W-Trimethoprim     REACTION: sore mouth    Current Outpatient Prescriptions  Medication Sig Dispense Refill  . albuterol (PROVENTIL HFA;VENTOLIN HFA) 108 (90 BASE) MCG/ACT inhaler Inhale 2 puffs into the lungs every 6 (six) hours as needed for wheezing.  1 Inhaler  0  . Ascorbic Acid (VITAMIN C) 500 MG tablet Take 500 mg by mouth daily.        [name] aspirin 81 MG tablet Take 81 mg by mouth daily.        [name] atorvastatin (LIPITOR) 40 MG tablet Take 1 tablet (40 mg total) by mouth daily.  90 tablet  2  . cetirizine (ZYRTEC) 10 MG tablet Take 10 mg by mouth at bedtime.        . meclizine (ANTIVERT) 25 MG tablet Take 25 mg by mouth 2 (two) times daily as needed.        . metoprolol (LOPRESSOR) 50 MG tablet Take 75mg  (1 1/2 tabs) in the am and 50mg  (1 tab) in the pm  75 tablet  6  . Multiple Vitamin (MULTIVITAMIN) capsule Take 1 capsule by mouth daily.        . sertraline (ZOLOFT) 50 MG tablet Take 1 tablet (50 mg  total) by mouth daily.  30 tablet  6  . tiotropium (SPIRIVA) 18 MCG inhalation capsule Place 1 capsule (18 mcg total) into inhaler and inhale daily.  30 capsule  6    History   Social History  . Marital Status: Married    Spouse Name: N/A    Number of Children: 0  . Years of Education: N/A   Occupational History  . 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 Smoking status: Former Games developer  . Smokeless tobacco: Former Neurosurgeon  . Alcohol Use: No  . Drug Use: No  . Sexually Active:    Other Topics Concern  . Not on file   Social History Narrative  . No narrative on file    Family History  Problem Relation Age of Onset  . Angina Mother     Past Medical History  Diagnosis Date  . Hyperlipidemia   . Hypertension   . COPD (chronic obstructive pulmonary disease)     Significant Dr. Delton Coombes  . Diverticulosis of colon   .  Prostatitis   . CAD (coronary artery disease)     Catheterization, 2003, occluded OM1, good collaterals  //   nuclear, August, 2009, low risk, EF 60%  . GERD (gastroesophageal reflux disease)   . Carotid artery disease     Doppler, February, 2012, 0-39% R. ICA, 40-59% LICA,  plan followup 1 year  . Ejection fraction     EF 60%, nuclear, 2009    Past Surgical History  Procedure Date  . Knee arthroscopy     right    ROS  Patient denies fever, chills, headache, sweats, rash, change in vision, change in hearing, cough, nausea vomiting, urinary symptoms. All the systems are reviewed and are negative.  PHYSICAL EXAM BP 144/80  Pulse 60  Temp 98.1 F (36.7 C)  Wt 233 lb (105.688 kg) Wt Readings from Last 3 Encounters:  03/09/12 233 lb (105.688 kg)  09/16/11 235 lb 6.4 oz (106.777 kg)  06/14/11 236 lb (107.049 kg)      Patient is overweight. He stable. He is oriented to person time and place. Affect is normal. There is no jugular venous distention. Lungs are clear. Respiratory effort is nonlabored. Cardiac exam reveals S1  and S2. There no clicks or significant murmurs. The abdomen is soft. There's no peripheral edema. There no musculoskeletal deformities. There are no skin rashes.   ASSESSMENT & PLAN 1. HYPERLIPIDEMIA  On Lipitor 40 mg daily. Recheck lipids today and CMET.  Lipid Panel, Comprehensive metabolic panel  2. CAD (coronary artery disease)  Quiet, followed by cards. On ASA, lipitor.    3. Screening for prostate cancer  PSA  4. Snoring  ?sleep apnea. Will refer to pulmonary for sleep study/evaluation. Ambulatory referral to Pulmonology  5. Need for prophylactic vaccination and inoculation against influenza  Flu vaccine greater than or equal to 3yo preservative free IM

## 2012-03-09 NOTE — Patient Instructions (Addendum)
Good to see you. Have a wonderful Christmas. Please stop by to see Hayden Deleon after you go to the lab to set up your pulmonologist referral.

## 2012-03-26 ENCOUNTER — Ambulatory Visit: Payer: BC Managed Care – PPO | Admitting: Cardiology

## 2012-04-02 ENCOUNTER — Institutional Professional Consult (permissible substitution): Payer: BC Managed Care – PPO | Admitting: Pulmonary Disease

## 2012-04-23 ENCOUNTER — Ambulatory Visit: Payer: BC Managed Care – PPO | Admitting: Cardiology

## 2012-05-01 ENCOUNTER — Ambulatory Visit (INDEPENDENT_AMBULATORY_CARE_PROVIDER_SITE_OTHER): Payer: BC Managed Care – PPO | Admitting: Pulmonary Disease

## 2012-05-01 ENCOUNTER — Encounter: Payer: Self-pay | Admitting: Pulmonary Disease

## 2012-05-01 VITALS — BP 142/68 | HR 66 | Temp 98.0°F | Ht 71.0 in | Wt 238.6 lb

## 2012-05-01 DIAGNOSIS — G4733 Obstructive sleep apnea (adult) (pediatric): Secondary | ICD-10-CM

## 2012-05-01 DIAGNOSIS — J449 Chronic obstructive pulmonary disease, unspecified: Secondary | ICD-10-CM

## 2012-05-01 NOTE — Assessment & Plan Note (Signed)
Obtain spirometry in future to quantitate lung function. Takes Spiriva in a.m. to avoid dryness of mouth.

## 2012-05-01 NOTE — Patient Instructions (Addendum)
Schedule sleep study

## 2012-05-01 NOTE — Progress Notes (Signed)
Subjective:    Patient ID: Hayden Deleon, male    DOB: 06-07-1955, 58 y.o.   MRN: [id number]  HPI 57 year old ex-smoker presents for evaluation of obstructive sleep apnea. He reports loud snoring which can be heard by his wife at the other end of the house. He reports occasional gasping episodes that wake him up from sleep. Epworth sleepiness score is 6/24. He sleeps in a recliner for the last 5 years at a 30 angle. Bedtime is 11 PM, sleep latency is 5 minutes, he is several awakenings but denies bathroom visits. He is out of bed by 6:30 AM feeling tired with occasional dryness of mouth but denies morning headaches. He reports excessive daytime fatigue during the day and around 3 PM sitting in front of his computer feels like he's going to crash. He drinks to 20 ounce soda daily and occasionally has hot green tea. He is maintained on Spiriva. He reports congestion at night and therefore started taking Spiriva at night. Allergy testing in the past has led him to avoid daily products after 4 PM.  There is no history suggestive of cataplexy, sleep paralysis or parasomnias   Past Medical History  Diagnosis Date  . Hyperlipidemia   . Hypertension   . COPD (chronic obstructive pulmonary disease)     Significant Dr. Delton Coombes  . Diverticulosis of colon   . Prostatitis   . CAD (coronary artery disease)     Catheterization, 2003, occluded OM1, good collaterals  //   nuclear, August, 2009, low risk, EF 60%  . GERD (gastroesophageal reflux disease)   . Carotid artery disease     Doppler, February, 2012, 0-39% R. ICA, 40-59% LICA,  plan followup 1 year  . Ejection fraction     EF 60%, nuclear, 2009    Past Surgical History  Procedure Date  . Knee arthroscopy     right    Allergies  Allergen Reactions  . Aspirin     REACTION: Nausea; fine with 81mg   . Avelox (Moxifloxacin Hcl In Nacl)     Facial swelling  . Sulfamethoxazole W-Trimethoprim     REACTION: sore mouth    History    Social History  . Marital Status: Married    Spouse Name: N/A    Number of Children: 0  . Years of Education: N/A   Occupational History  . 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 Smoking status: Former Smoker -- 2.0 packs/day for 32 years    Types: Cigarettes    Quit date: 08/31/1999  . Smokeless tobacco: Former Neurosurgeon  . Alcohol Use: No  . Drug Use: No  . Sexually Active: Not on file   Other Topics Concern  . Not on file   Social History Narrative  . No narrative on file    Review of Systems  Constitutional: Negative for appetite change and unexpected weight change.  HENT: Positive for trouble swallowing. Negative for ear pain, congestion, sore throat, sneezing and dental problem.   Respiratory: Positive for cough and shortness of breath.   Cardiovascular: Negative for chest pain, palpitations and leg swelling.  Gastrointestinal: Negative for abdominal pain.  Musculoskeletal: Positive for arthralgias. Negative for joint swelling.  Skin: Negative for rash.  Neurological: Negative for headaches.  Psychiatric/Behavioral: Negative for dysphoric mood. The patient is not nervous/anxious.        Objective:   Physical Exam  Gen. Pleasant, obese, in no distress, normal affect ENT - no lesions, no  post nasal drip, class 3 airway Neck: No JVD, no thyromegaly, no carotid bruits Lungs: no use of accessory muscles, no dullness to percussion, decreased without rales or rhonchi  Cardiovascular: Rhythm regular, heart sounds  normal, no murmurs or gallops, no peripheral edema Abdomen: soft and non-tender, no hepatosplenomegaly, BS normal. Musculoskeletal: No deformities, no cyanosis or clubbing Neuro:  alert, non focal, no tremors       Assessment & Plan:

## 2012-05-01 NOTE — Assessment & Plan Note (Signed)
Given excessive daytime somnolence, narrow pharyngeal exam, witnessed apneas & loud snoring, obstructive sleep apnea is very likely & an overnight polysomnogram will be scheduled as a split study. The pathophysiology of obstructive sleep apnea , it's cardiovascular consequences & modes of treatment including CPAP were discused with the patient in detail & they evidenced understanding.  

## 2012-05-18 ENCOUNTER — Ambulatory Visit: Payer: BC Managed Care – PPO | Admitting: Cardiology

## 2012-05-20 ENCOUNTER — Ambulatory Visit (HOSPITAL_BASED_OUTPATIENT_CLINIC_OR_DEPARTMENT_OTHER): Payer: BC Managed Care – PPO | Attending: Pulmonary Disease | Admitting: Radiology

## 2012-05-20 VITALS — Ht 71.0 in | Wt 241.0 lb

## 2012-05-20 DIAGNOSIS — G4733 Obstructive sleep apnea (adult) (pediatric): Secondary | ICD-10-CM | POA: Insufficient documentation

## 2012-05-22 ENCOUNTER — Ambulatory Visit (INDEPENDENT_AMBULATORY_CARE_PROVIDER_SITE_OTHER): Payer: BC Managed Care – PPO | Admitting: Cardiology

## 2012-05-22 ENCOUNTER — Encounter: Payer: Self-pay | Admitting: Cardiology

## 2012-05-22 VITALS — BP 120/70 | HR 53 | Ht 71.0 in | Wt 236.4 lb

## 2012-05-22 DIAGNOSIS — F411 Generalized anxiety disorder: Secondary | ICD-10-CM

## 2012-05-22 DIAGNOSIS — I2510 Atherosclerotic heart disease of native coronary artery without angina pectoris: Secondary | ICD-10-CM

## 2012-05-22 DIAGNOSIS — J449 Chronic obstructive pulmonary disease, unspecified: Secondary | ICD-10-CM

## 2012-05-22 DIAGNOSIS — G4733 Obstructive sleep apnea (adult) (pediatric): Secondary | ICD-10-CM

## 2012-05-22 DIAGNOSIS — I10 Essential (primary) hypertension: Secondary | ICD-10-CM

## 2012-05-22 DIAGNOSIS — E785 Hyperlipidemia, unspecified: Secondary | ICD-10-CM

## 2012-05-22 DIAGNOSIS — J4489 Other specified chronic obstructive pulmonary disease: Secondary | ICD-10-CM

## 2012-05-22 DIAGNOSIS — I779 Disorder of arteries and arterioles, unspecified: Secondary | ICD-10-CM

## 2012-05-22 MED ORDER — ATORVASTATIN CALCIUM 40 MG PO TABS: 40.0000 mg | ORAL_TABLET | Freq: Every day | ORAL | Status: DC

## 2012-05-22 MED ORDER — METOPROLOL TARTRATE 50 MG PO TABS: ORAL_TABLET | ORAL | Status: DC

## 2012-05-22 NOTE — Progress Notes (Signed)
Patient ID: Hayden Deleon, male   DOB: 03/16/1956, 57 y.o.   MRN: [id number]   HPI  Patient is seen today to followup coronary disease. I saw him last in June, 2013. He has known coronary disease. Catheterization in 2003 revealed occlusion of OM 1 with good collaterals. Nuclear scan August, 2009 revealed no significant ischemia. When I talk to him last in June, he thought that he was having some tightness when walking. At that time we decided to increase his beta blocker.  More recently the patient has been seen by Dr. Vassie Loll for assessment of the possibility of sleep apnea. The results of his studies are pending. In addition the patient has had some congestion and feels that he has some shortness of breath from his lung disease.  The patient does note that he's had some chest tightness. In particular this may happen when he is exercising after he has eaten.  The patient also mentions that he has had some anxiety. In the past he took some Zoloft that did help.  Allergies  Allergen Reactions  . Aspirin     REACTION: Nausea; fine with 81mg   . Avelox (Moxifloxacin Hcl In Nacl)     Facial swelling  . Sulfamethoxazole W-Trimethoprim     REACTION: sore mouth    Current Outpatient Prescriptions  Medication Sig Dispense Refill  . albuterol (PROVENTIL HFA;VENTOLIN HFA) 108 (90 BASE) MCG/ACT inhaler Inhale 2 puffs into the lungs every 6 (six) hours as needed for wheezing.  1 Inhaler  0  . Ascorbic Acid (VITAMIN C) 500 MG tablet Take 500 mg by mouth daily.        [name] aspirin 81 MG tablet Take 81 mg by mouth daily.        [name] atorvastatin (LIPITOR) 40 MG tablet Take 1 tablet (40 mg total) by mouth daily.  90 tablet  2  . cetirizine (ZYRTEC) 10 MG tablet Take 10 mg by mouth at bedtime.        . metoprolol (LOPRESSOR) 50 MG tablet Take 75mg  (1 1/2 tabs) in the am and 50mg  (1 tab) in the pm  75 tablet  6  . Multiple Vitamin (MULTIVITAMIN) capsule Take 1 capsule by mouth daily.        [name] tiotropium  (SPIRIVA) 18 MCG inhalation capsule Place 18 mcg into inhaler and inhale daily.       No current facility-administered medications for this visit.    History   Social History  . Marital Status: Married    Spouse Name: N/A    Number of Children: 0  . Years of Education: N/A   Occupational History  . 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 Smoking status: Former Smoker -- 2.00 packs/day for 32 years    Types: Cigarettes    Quit date: 08/31/1999  . Smokeless tobacco: Former Neurosurgeon  . Alcohol Use: No  . Drug Use: No  . Sexually Active: Not on file   Other Topics Concern  . Not on file   Social History Narrative  . No narrative on file    Family History  Problem Relation Age of Onset  . Angina Mother     Past Medical History  Diagnosis Date  . Hyperlipidemia   . Hypertension   . COPD (chronic obstructive pulmonary disease)     Significant Dr. Delton Coombes  . Diverticulosis of colon   . Prostatitis   . CAD (coronary artery disease)  Catheterization, 2003, occluded OM1, good collaterals  //   nuclear, August, 2009, low risk, EF 60%  . GERD (gastroesophageal reflux disease)   . Carotid artery disease     Doppler, February, 2012, 0-39% R. ICA, 40-59% LICA,  plan followup 1 year  . Ejection fraction     EF 60%, nuclear, 2009    Past Surgical History  Procedure Laterality Date  . Knee arthroscopy      right    Patient Active Problem List  Diagnosis  . COLONIC POLYPS  . HYPERLIPIDEMIA  . ANXIETY  . HYPERTENSION  . COPD  . GASTROESOPHAGEAL REFLUX DISEASE, CHRONIC  . HIATAL HERNIA  . DIVERTICULOSIS, COLON  . SYSTOLIC MURMUR  . DYSPHAGIA UNSPECIFIED  . ANGIOEDEMA  . ADVEF, DRUG/MEDICINAL/BIOLOGICAL SUBST NOS  . PROSTATITIS, HX OF  . MIXED HYPERLIPIDEMIA  . BENIGN POSITIONAL VERTIGO  . CAD (coronary artery disease)  . Carotid artery disease  . COPD (chronic obstructive pulmonary disease)  . GERD (gastroesophageal reflux disease)    . Ejection fraction  . OSA (obstructive sleep apnea)    ROS   Patient denies fever, chills, headache, sweats, rash, change in vision, change in hearing, nausea vomiting, urinary symptoms. All other systems are reviewed and are negative other than the history of present illness.  PHYSICAL EXAM   Patient is oriented to person time and place. Affect is normal. He is overweight. There is no jugulovenous distention. Lung exam does reveal scattered rhonchi with some decreased breath sounds. There is no respiratory distress. Cardiac exam reveals S1 and S2. There no clicks or significant murmurs. The abdomen is soft. There is no peripheral edema. There no musculoskeletal deformities. There no skin rashes.  Filed Vitals:   05/22/12 1022  BP: 120/70  Pulse: 53  Height: 5\' 11"  (1.803 m)  Weight: 236 lb 6.4 oz (107.23 kg)  SpO2: 93%   EKG is done today and reviewed by me. There is normal sinus rhythm. EKG is normal. There is no change from the past.  ASSESSMENT & PLAN

## 2012-05-22 NOTE — Assessment & Plan Note (Signed)
There is known coronary disease. His last stress test was 2009. It is been 5 years. In addition I am concerned that some of his symptoms now may be angina, specifically how he feels when he tries to exercise after eating. We will proceed with a Lexiscan Myoview. We know that he does not get adequate heart rate increase in the past when walking. I will be in touch with him with the information.

## 2012-05-22 NOTE — Assessment & Plan Note (Signed)
This is being fully evaluated at this time.  As part of today's evaluation I spent greater than 25 minutes with his overall care. More than half of this was spent with direct counseling with him. This included his cardiac symptoms and his shortness of breath and the issue of some anxiety.

## 2012-05-22 NOTE — Assessment & Plan Note (Signed)
The patient is having some problems with some anxiety. He mentioned that he receive Zoloft in the past and that this helped him. I've encouraged and to check with his primary physician about the possible resumption of his Zoloft. I do not think that anxiety is the basis of some of his shortness of breath and chest discomfort.

## 2012-05-22 NOTE — Assessment & Plan Note (Signed)
Blood pressure control today. No change in therapy.

## 2012-05-22 NOTE — Assessment & Plan Note (Signed)
The patient has known moderate carotid disease. He will be due for a followup Doppler in June, 2014.

## 2012-05-22 NOTE — Assessment & Plan Note (Signed)
The patient is receiving appropriate treatment for his lipids.

## 2012-05-22 NOTE — Assessment & Plan Note (Signed)
The patient does have COPD. I believe that this does play a role with some of his shortness of breath.

## 2012-05-22 NOTE — Patient Instructions (Addendum)
Your physician wants you to follow-up in: 6 months.   You will receive a reminder letter in the mail two months in advance. If you don't receive a letter, please call our office to schedule the follow-up appointment.  Your physician has requested that you have a lexiscan myoview. For further information please visit [email or web address] Please follow instruction sheet, as given.  Your physician recommends that you continue on your current medications as directed. Please refer to the Current Medication list given to you today.  Talk to your primary physician about resuming your zoloft

## 2012-05-29 ENCOUNTER — Other Ambulatory Visit: Payer: Self-pay

## 2012-05-29 ENCOUNTER — Other Ambulatory Visit: Payer: Self-pay | Admitting: *Deleted

## 2012-05-29 DIAGNOSIS — G4730 Sleep apnea, unspecified: Secondary | ICD-10-CM

## 2012-05-29 DIAGNOSIS — G4710 Hypersomnia, unspecified: Secondary | ICD-10-CM

## 2012-05-29 MED ORDER — SERTRALINE HCL 50 MG PO TABS: 50.0000 mg | ORAL_TABLET | Freq: Every day | ORAL | Status: DC

## 2012-05-29 NOTE — Telephone Encounter (Signed)
Pt request refill to [facility] for sertraline; pt said [facility] had requested x 3 electronically. Spoke with Rob gave verbal refill and Rob will change pt's name to [facility] instead of [facility] incase that did not let electronic request come thru.pt notified.

## 2012-05-30 NOTE — Procedures (Signed)
NAME:  Hayden Deleon, Hayden Deleon NO.:  [id number]  MEDICAL RECORD NO.:  [id number]          PATIENT TYPE:  OUT  LOCATION:  SLEEP CENTER                 FACILITY:  [facility]  PHYSICIAN:  Oretha Milch, MD      DATE OF BIRTH:  July 13, 1955  DATE OF STUDY:  05/20/2012                           NOCTURNAL POLYSOMNOGRAM  REFERRING PHYSICIAN:  Oretha Milch, MD  INDICATION FOR STUDY:  Excessive daytime somnolence and loud snoring in this 57 year old gentleman with hypertension.  At the time of this study, he weighed 241 pounds with a height of 5 feet and 11 inches, BMI of 34, neck size of 19 inches.  EPWORTH SLEEPINESS SCORE:  4.  MEDICATIONS:  None.  This nocturnal polysomnogram was performed with sleep technologist in attendance.  EEG, EOG, EMG, EKG, and respiratory parameters were recorded.  Sleep stages, arousals, limb movements, and respiratory data were scored according to criteria laid out by the American Academy of Sleep Medicine.  SLEEP ARCHITECTURE:  Lights out was at 10:10 p.m., lights on was at 5:49 a.m.  CPAP was initiated at 2 a.m.  During the diagnostic portion, total sleep time was 120 minutes with a sleep period time of 196 minutes and the sleep efficiency of 52%.  Sleep latency was 34 minutes, latency to REM sleep was 122 minutes, and wake after sleep onset was 76 minutes. Sleep stages of the percentage of total sleep time was N1 33%, N2 53%, N3 0%, and REM 14% (17 minutes).  Supine sleep accounted for 59 minute. During the titration portion, only 1.5 minutes of sleep was noted. Longest period of REM sleep was around 1 a.m.  RESPIRATORY DATA:  During the diagnostic portion, there were 7 obstructive apneas, 0 central apnea, 0 mixed apnea, and 50 hypopneas with an apnea-hypopnea index of 28 events per hour and a lowest desaturation of 84%.  Due to this degree of respiratory disturbance, CPAP was initiated at 5 cm and titrated to 7 cm.  However, not much sleep was  noted on either of these settings.  The patient was unable to go back to sleep.  AROUSAL DATA:  The arousal index was 47 events per hour.  Of the 94 arousals, 61 were spontaneous and the rest were associated with respiratory events.  OXYGEN DATA:  The lowest desaturation was 84%.  The desaturation index was 24 events per hour.  He spent 2.4 minutes with a saturation less than 88%.  CARDIAC DATA:  The low heart rate was 43 beats per minute.  The high heart rate was 82 beats per minute.  No arrhythmias were noted.  MOVEMENT-PARASOMNIA:  No significant limb movements were noted.  DISCUSSION:  He was desensitized with a large fullface mask.  The titration could not be performed since he was unable to go back to sleep.  Moderate snoring was noted.  IMPRESSIONS-RECOMMENDATIONS: 1. Moderate obstructive sleep apnea with predominant hypopneas causing     sleep fragmentation and oxygen desaturation. 2. No evidence of cardiac arrhythmias, limb movements, or behavioral     disturbance during sleep.  RECOMMEND: 1. The treatment options for this degree of sleep-disordered breathing     include weight  loss and CPAP therapy. 2. Auto CPAP can be trialed with a large fullface mask and humidity. 3. He should be asked to avoid medications and sedative side effects.     He should to be cautioned against driving when sleepy.     Oretha Milch, MD    RVA/MEDQ  D:  05/29/2012 12:04:03  T:  05/30/2012 02:06:10  Job:  [id number]

## 2012-05-31 ENCOUNTER — Telehealth: Payer: Self-pay | Admitting: Pulmonary Disease

## 2012-05-31 DIAGNOSIS — G4733 Obstructive sleep apnea (adult) (pediatric): Secondary | ICD-10-CM

## 2012-05-31 NOTE — Telephone Encounter (Signed)
He had quite a few events, stopped breathing 28 times/h He did not sleep much on CPAP - Is he willing to try this at home ? If so, start CPAP 7cm , pillows, humidity, download in 2 weeks

## 2012-06-01 NOTE — Telephone Encounter (Signed)
lmomtcb x1 

## 2012-06-01 NOTE — Telephone Encounter (Signed)
I spoke with patient about results and he verbalized understanding and had no questions. Pt would to be set up on CPAP. i have sent order.

## 2012-06-02 ENCOUNTER — Ambulatory Visit: Payer: BC Managed Care – PPO | Admitting: Pulmonary Disease

## 2012-06-02 ENCOUNTER — Other Ambulatory Visit: Payer: Self-pay | Admitting: *Deleted

## 2012-06-02 MED ORDER — SERTRALINE HCL 50 MG PO TABS: 50.0000 mg | ORAL_TABLET | Freq: Every day | ORAL | Status: DC

## 2012-06-04 ENCOUNTER — Telehealth: Payer: Self-pay | Admitting: Pulmonary Disease

## 2012-06-04 DIAGNOSIS — G4733 Obstructive sleep apnea (adult) (pediatric): Secondary | ICD-10-CM

## 2012-06-04 NOTE — Telephone Encounter (Signed)
Duplicate message. I have already sent in order.

## 2012-06-04 NOTE — Telephone Encounter (Signed)
Order has been sent to [facility].  

## 2012-06-09 ENCOUNTER — Telehealth: Payer: Self-pay | Admitting: Pulmonary Disease

## 2012-06-09 NOTE — Telephone Encounter (Signed)
Order refaxed to [facility] for heated humidity [employer] [employer]

## 2012-06-09 NOTE — Telephone Encounter (Signed)
We sent order 06/04/12 for this. I called millie and she stated they never received this. Pt states he will be holding until after April when he gets his taxes d/t his high deductible. Will forward to [facility] to refax this and RA so he is aware.

## 2012-06-17 ENCOUNTER — Ambulatory Visit (HOSPITAL_COMMUNITY): Payer: BC Managed Care – PPO | Attending: Cardiology | Admitting: Radiology

## 2012-06-17 VITALS — BP 134/73 | HR 43 | Ht 71.0 in | Wt 230.0 lb

## 2012-06-17 DIAGNOSIS — R0989 Other specified symptoms and signs involving the circulatory and respiratory systems: Secondary | ICD-10-CM | POA: Insufficient documentation

## 2012-06-17 DIAGNOSIS — R0602 Shortness of breath: Secondary | ICD-10-CM

## 2012-06-17 DIAGNOSIS — I2510 Atherosclerotic heart disease of native coronary artery without angina pectoris: Secondary | ICD-10-CM

## 2012-06-17 DIAGNOSIS — R079 Chest pain, unspecified: Secondary | ICD-10-CM

## 2012-06-17 DIAGNOSIS — R0609 Other forms of dyspnea: Secondary | ICD-10-CM | POA: Insufficient documentation

## 2012-06-17 DIAGNOSIS — I10 Essential (primary) hypertension: Secondary | ICD-10-CM | POA: Insufficient documentation

## 2012-06-17 DIAGNOSIS — R0789 Other chest pain: Secondary | ICD-10-CM | POA: Insufficient documentation

## 2012-06-17 MED ORDER — TECHNETIUM TC 99M SESTAMIBI GENERIC - CARDIOLITE
11.0000 | Freq: Once | INTRAVENOUS | Status: AC | PRN
  Administered 2012-06-17: 11 via INTRAVENOUS

## 2012-06-17 MED ORDER — REGADENOSON 0.4 MG/5ML IV SOLN
0.4000 mg | Freq: Once | INTRAVENOUS | Status: AC
  Administered 2012-06-17: 0.4 mg via INTRAVENOUS

## 2012-06-17 MED ORDER — TECHNETIUM TC 99M SESTAMIBI GENERIC - CARDIOLITE
33.0000 | Freq: Once | INTRAVENOUS | Status: AC | PRN
  Administered 2012-06-17: 33 via INTRAVENOUS

## 2012-06-17 NOTE — Progress Notes (Signed)
[facility] SITE 3 NUCLEAR MED [address] [place], [place] [address] [phone]    Cardiology Nuclear Med Study  Cap Massi Rehfeld is a 57 y.o. male     MRN : [id number]     DOB: [birth year]  Procedure Date: 06/17/2012  Nuclear Med Background Indication for Stress Test:  Evaluation for Ischemia History:  '03 Cath:occluded OM-1 with collaterals; '09 QQV:ZDGLOV, EF=60% Cardiac Risk Factors: Carotid Disease, History of Smoking, Hypertension, Lipids and Obesity  Symptoms:  Chest Tightness with Exertion (last episode of chest discomfort was Monday, 06/15/12.) and DOE   Nuclear Pre-Procedure Caffeine/Decaff Intake:  None > 12 hrs NPO After: 7:00pm   Lungs:  Clear. O2 Sat: 95% on room air. IV 0.9% NS with Angio Cath:  22g  IV Site: R Antecubital x 1, tolerated well IV Started by:  Irean Hong, RN  Chest Size (in):  48 Cup Size: n/a  Height: 5\' 11"  (1.803 m)  Weight:  230 lb (104.327 kg)  BMI:  Body mass index is 32.09 kg/(m^2). Tech Comments:  Lopressor held x 24 hours    Nuclear Med Study 1 or 2 day study: 1 day  Stress Test Type:  Treadmill/Lexiscan  Reading MD: Willa Rough, MD  Order Authorizing Provider:  Willa Rough, MD  Resting Radionuclide: Technetium 8m Sestamibi  Resting Radionuclide Dose: 11.0 mCi   Stress Radionuclide:  Technetium 11m Sestamibi  Stress Radionuclide Dose: 33.0 mCi           Stress Protocol Rest HR: 43 Stress HR: 73  Rest BP: 134/73 Stress BP: 139/62  Exercise Time (min): 2:00 METS: n/a   Predicted Max HR: 164 bpm % Max HR: 44.51 bpm Rate Pressure Product: 56433   Dose of Adenosine (mg):  n/a Dose of Lexiscan: 0.4 mg  Dose of Atropine (mg): n/a Dose of Dobutamine: n/a mcg/kg/min (at max HR)  Stress Test Technologist: Smiley Houseman, CMA-N  Nuclear Technologist:  Domenic Polite, CNMT     Rest Procedure:  Myocardial perfusion imaging was performed at rest 45 minutes following the intravenous administration of Technetium 49m  Sestamibi.  Rest ECG: Sinus bradycardia  Stress Procedure:  The patient received IV Lexiscan 0.4 mg over 15-seconds with concurrent low level exercise and then Technetium 76m Sestamibi was injected at 30-seconds while the patient continued walking one more minute.  Quantitative spect images were obtained after a 45-minute delay.  Stress ECG: No significant ST segment change suggestive of ischemia.  QPS Raw Data Images:  Patient motion noted; appropriate software correction applied. Stress Images:  There is a small area of moderate decrease photon activity in the base inferior and mid inferior segments. Rest Images:  There is a very small area of mild decreased photon activity in the mid inferior segment Subtraction (SDS):  There is mild reversibility in the areas mentioned Transient Ischemic Dilatation (Normal <1.22):  1.14 Lung/Heart Ratio (Normal <0.45):  0.30  Quantitative Gated Spect Images QGS EDV:  99 ml QGS ESV:  37 ml  Impression Exercise Capacity:  Lexiscan with low level exercise. BP Response:  Normal blood pressure response. Clinical Symptoms:  cough ECG Impression:  No significant ST segment change suggestive of ischemia. Comparison with Prior Nuclear Study: No images to compare  Overall Impression:  There is a small area of scar with mild peri-infarct ischemia at the base-inferior and mid-inferior segments. Wall motion analysis does show a mild wall motion abnormality in that area. This is a low risk scan.  LV Ejection Fraction: 63%.  LV Wall Motion:     Very mild hypokinesis at the base of the inferior wall.  Willa Rough, MD

## 2012-06-19 ENCOUNTER — Encounter: Payer: Self-pay | Admitting: Cardiology

## 2012-06-19 ENCOUNTER — Telehealth: Payer: Self-pay | Admitting: Cardiology

## 2012-06-19 NOTE — Telephone Encounter (Signed)
Myocardial perfusion scan results given to pt.

## 2012-06-19 NOTE — Telephone Encounter (Signed)
Patient returning nurse call, he can be reached at [phone]

## 2012-07-31 DIAGNOSIS — K4090 Unilateral inguinal hernia, without obstruction or gangrene, not specified as recurrent: Secondary | ICD-10-CM | POA: Insufficient documentation

## 2012-08-20 ENCOUNTER — Other Ambulatory Visit: Payer: Self-pay | Admitting: Family Medicine

## 2012-10-29 ENCOUNTER — Other Ambulatory Visit: Payer: Self-pay | Admitting: *Deleted

## 2012-10-29 MED ORDER — SERTRALINE HCL 50 MG PO TABS: 50.0000 mg | ORAL_TABLET | Freq: Every day | ORAL | Status: DC

## 2012-11-07 ENCOUNTER — Telehealth: Payer: Self-pay | Admitting: Pulmonary Disease

## 2012-11-07 NOTE — Telephone Encounter (Signed)
He needs post CPAP fu OV Download 10/28/12 on 7 cm  - AHI 5/h, good usage, no leak

## 2012-11-09 NOTE — Telephone Encounter (Signed)
I spoke with patient about results and he verbalized understanding and had no questions appt scheduled 

## 2012-11-09 NOTE — Telephone Encounter (Signed)
LMTCB x 1 

## 2012-12-28 ENCOUNTER — Ambulatory Visit (INDEPENDENT_AMBULATORY_CARE_PROVIDER_SITE_OTHER): Payer: BC Managed Care – PPO | Admitting: Pulmonary Disease

## 2012-12-28 ENCOUNTER — Encounter: Payer: Self-pay | Admitting: Pulmonary Disease

## 2012-12-28 VITALS — BP 122/56 | HR 64 | Ht 70.0 in | Wt 236.8 lb

## 2012-12-28 DIAGNOSIS — J4489 Other specified chronic obstructive pulmonary disease: Secondary | ICD-10-CM

## 2012-12-28 DIAGNOSIS — G4733 Obstructive sleep apnea (adult) (pediatric): Secondary | ICD-10-CM

## 2012-12-28 DIAGNOSIS — J449 Chronic obstructive pulmonary disease, unspecified: Secondary | ICD-10-CM

## 2012-12-28 MED ORDER — ALBUTEROL SULFATE HFA 108 (90 BASE) MCG/ACT IN AERS: 2.0000 | INHALATION_SPRAY | Freq: Four times a day (QID) | RESPIRATORY_TRACT | Status: DC | PRN

## 2012-12-28 NOTE — Assessment & Plan Note (Signed)
Your CPAP is set at 7 cm  Weight loss encouraged, compliance with goal of at least 4-6 hrs every night is the expectation. Advised against medications with sedative side effects Cautioned against driving when sleepy - understanding that sleepiness will vary on a day to day basis

## 2012-12-28 NOTE — Assessment & Plan Note (Signed)
Treat as acute bronchitis Breathing test next visit Refill on albuterol Take mucinex -DM for cough Call for antibiotic if phlegm changes color Call if wheezing or no better by end of week

## 2012-12-28 NOTE — Patient Instructions (Signed)
Your CPAP is set at 7 cm Breathing test next visit Refill on albuterol Take mucinex -DM for cough Call for antibiotic if phlegm changes color Call if wheezing or no better by end of week

## 2012-12-28 NOTE — Progress Notes (Signed)
  Subjective:    Patient ID: Hayden Deleon, male    DOB: [birth year], 57 y.o.   MRN: [id number]  HPI   57 year old ex-smoker presents for FU of obstructive sleep apnea & COPD.   He is maintained on Spiriva. He reports congestion at night and therefore started taking Spiriva at night. Allergy testing in the past has led him to avoid daily products after 4 PM.  12/28/2012 PSG showed -AHI 28 times/h  He did not sleep much on CPAP - start CPAP 7cm , pillows, humidity,  Download 10/28/12 on 7 cm - AHI 5/h, good usage, no leak  Pt reports he is wearing his CPAP everynight x 6 hrs a night. Denies any problems with machine. He stated he has a cold and having hard time breathing but the CPAP has helped.   C/o dry cough, no wheeze, using albuterol MDI twice daily, takes spiriva in am  Review of Systems neg for any significant sore throat, dysphagia, itching, sneezing, nasal congestion or excess/ purulent secretions, fever, chills, sweats, unintended wt loss, pleuritic or exertional cp, hempoptysis, orthopnea pnd or change in chronic leg swelling. Also denies presyncope, palpitations, heartburn, abdominal pain, nausea, vomiting, diarrhea or change in bowel or urinary habits, dysuria,hematuria, rash, arthralgias, visual complaints, headache, numbness weakness or ataxia.     Objective:   Physical Exam  Gen. Pleasant, obese, in no distress ENT - no lesions, no post nasal drip Neck: No JVD, no thyromegaly, no carotid bruits Lungs: no use of accessory muscles, no dullness to percussion, decreased without rales or rhonchi  Cardiovascular: Rhythm regular, heart sounds  normal, no murmurs or gallops, no peripheral edema Musculoskeletal: No deformities, no cyanosis or clubbing , no tremors       Assessment & Plan:

## 2012-12-28 NOTE — Addendum Note (Signed)
Addended by: Tommie Sams on: 12/28/2012 05:24 PM   Modules accepted: Orders

## 2013-01-07 ENCOUNTER — Telehealth: Payer: Self-pay | Admitting: Pulmonary Disease

## 2013-01-07 MED ORDER — AZITHROMYCIN 250 MG PO TABS: ORAL_TABLET | ORAL | Status: DC

## 2013-01-07 MED ORDER — PREDNISONE 10 MG PO TABS: ORAL_TABLET | ORAL | Status: DC

## 2013-01-07 NOTE — Telephone Encounter (Signed)
zpak Prednisone 10 mg tabs  Take 2 tabs daily with food x 5ds, then 1 tab daily with food x 5ds then STOP  

## 2013-01-07 NOTE — Telephone Encounter (Signed)
Spoke with the pt  He was last seen here by RA on 12/28/12 He c/o still having tightness and chest, and has started to notice wheezing the past couple days with increased SOB Cough is the same- non prod  Taking SABA about bid and has tried mucinex DM Please advise, thanks! Allergies  Allergen Reactions  . Aspirin     REACTION: Nausea; fine with 81mg   . Avelox [Moxifloxacin Hcl In Nacl]     Facial swelling  . Sulfamethoxazole-Trimethoprim     REACTION: sore mouth   [facility]

## 2013-01-07 NOTE — Telephone Encounter (Signed)
Pt aware of recs. RX's have been sent.

## 2013-02-08 ENCOUNTER — Ambulatory Visit (HOSPITAL_COMMUNITY): Payer: BC Managed Care – PPO | Attending: Family Medicine

## 2013-02-08 ENCOUNTER — Encounter: Payer: Self-pay | Admitting: Cardiology

## 2013-02-08 DIAGNOSIS — I658 Occlusion and stenosis of other precerebral arteries: Secondary | ICD-10-CM | POA: Insufficient documentation

## 2013-02-08 DIAGNOSIS — I10 Essential (primary) hypertension: Secondary | ICD-10-CM | POA: Insufficient documentation

## 2013-02-08 DIAGNOSIS — E785 Hyperlipidemia, unspecified: Secondary | ICD-10-CM | POA: Insufficient documentation

## 2013-02-08 DIAGNOSIS — J4489 Other specified chronic obstructive pulmonary disease: Secondary | ICD-10-CM | POA: Insufficient documentation

## 2013-02-08 DIAGNOSIS — J449 Chronic obstructive pulmonary disease, unspecified: Secondary | ICD-10-CM | POA: Insufficient documentation

## 2013-02-08 DIAGNOSIS — Z87891 Personal history of nicotine dependence: Secondary | ICD-10-CM | POA: Insufficient documentation

## 2013-02-08 DIAGNOSIS — I2510 Atherosclerotic heart disease of native coronary artery without angina pectoris: Secondary | ICD-10-CM | POA: Insufficient documentation

## 2013-02-08 DIAGNOSIS — I6529 Occlusion and stenosis of unspecified carotid artery: Secondary | ICD-10-CM

## 2013-02-08 DIAGNOSIS — I739 Peripheral vascular disease, unspecified: Secondary | ICD-10-CM | POA: Insufficient documentation

## 2013-02-10 ENCOUNTER — Encounter: Payer: Self-pay | Admitting: *Deleted

## 2013-02-18 ENCOUNTER — Other Ambulatory Visit: Payer: Self-pay | Admitting: Family Medicine

## 2013-02-18 MED ORDER — SERTRALINE HCL 50 MG PO TABS: 50.0000 mg | ORAL_TABLET | Freq: Every day | ORAL | Status: DC

## 2013-04-20 ENCOUNTER — Ambulatory Visit: Payer: BC Managed Care – PPO | Admitting: Family Medicine

## 2013-04-22 ENCOUNTER — Ambulatory Visit: Payer: BC Managed Care – PPO | Admitting: Family Medicine

## 2013-04-26 ENCOUNTER — Encounter: Payer: Self-pay | Admitting: Family Medicine

## 2013-04-26 ENCOUNTER — Ambulatory Visit (INDEPENDENT_AMBULATORY_CARE_PROVIDER_SITE_OTHER): Payer: BC Managed Care – PPO | Admitting: Family Medicine

## 2013-04-26 VITALS — BP 124/64 | HR 65 | Temp 98.2°F | Ht 69.0 in | Wt 224.0 lb

## 2013-04-26 DIAGNOSIS — F411 Generalized anxiety disorder: Secondary | ICD-10-CM

## 2013-04-26 DIAGNOSIS — I10 Essential (primary) hypertension: Secondary | ICD-10-CM

## 2013-04-26 DIAGNOSIS — Z23 Encounter for immunization: Secondary | ICD-10-CM

## 2013-04-26 DIAGNOSIS — Z125 Encounter for screening for malignant neoplasm of prostate: Secondary | ICD-10-CM

## 2013-04-26 DIAGNOSIS — E785 Hyperlipidemia, unspecified: Secondary | ICD-10-CM

## 2013-04-26 LAB — COMPREHENSIVE METABOLIC PANEL
ALK PHOS: 65 U/L (ref 39–117)
ALT: 21 U/L (ref 0–53)
AST: 21 U/L (ref 0–37)
Albumin: 4.1 g/dL (ref 3.5–5.2)
BUN: 11 mg/dL (ref 6–23)
CO2: 29 mEq/L (ref 19–32)
CREATININE: 0.9 mg/dL (ref 0.4–1.5)
Calcium: 9.6 mg/dL (ref 8.4–10.5)
Chloride: 105 mEq/L (ref 96–112)
GFR: 94.74 mL/min (ref >60.00)
Glucose, Bld: 94 mg/dL (ref 70–99)
Potassium: 4.4 mEq/L (ref 3.5–5.1)
Sodium: 142 mEq/L (ref 135–145)
Total Bilirubin: 0.9 mg/dL (ref 0.3–1.2)
Total Protein: 6.9 g/dL (ref 6.0–8.3)

## 2013-04-26 LAB — LIPID PANEL
CHOLESTEROL: 145 mg/dL (ref 0–200)
HDL: 42.8 mg/dL (ref >39.00)
LDL CALC: 78 mg/dL (ref 0–99)
TRIGLYCERIDES: 121 mg/dL (ref 0.0–149.0)
Total CHOL/HDL Ratio: 3
VLDL: 24.2 mg/dL (ref 0.0–40.0)

## 2013-04-26 LAB — PSA: PSA: 1.03 ng/mL (ref 0.10–4.00)

## 2013-04-26 NOTE — Assessment & Plan Note (Signed)
Stable on zoloft 50 mg daily. No changes.

## 2013-04-26 NOTE — Assessment & Plan Note (Signed)
No changes.  Well controlled on current rx.

## 2013-04-26 NOTE — Progress Notes (Signed)
Very pleasant 58 yo male with h/o CAD (followed by Dr. Ron Parker), HLD on atorvastatin here for rx refills/follow up.  HLD-  Lab Results  Component Value Date   CHOL 126 03/09/2012   HDL 36.60* 03/09/2012   LDLCALC 67 03/09/2012   LDLDIRECT 139.1 08/06/2007   TRIG 114.0 03/09/2012   CHOLHDL 3 03/09/2012   CAD- followed by Dr. Ron Parker.  Non smoker, non diabetic and he is active.  Cholesterol has been treated aggressively.  ? Check PSA-  Having no symptoms of difficulty starting or stopping his stream.  No family h/o prostate CA.  He is asking for this be checked.  Lab Results  Component Value Date   PSA 0.91 03/09/2012   PSA 1.06 05/03/2010   PSA 0.82 11/10/2008   OSA- has CPAP, followed by Dr. Elsworth Soho. No CP or worsening SOB.    Anxiety- symptoms well controlled on Zoloft.  He enjoys traveling with his job. Allergies  Allergen Reactions  . Aspirin     REACTION: Nausea; fine with 81mg   . Avelox [Moxifloxacin Hcl In Nacl]     Facial swelling  . Sulfamethoxazole-Trimethoprim     REACTION: sore mouth    Current Outpatient Prescriptions  Medication Sig Dispense Refill  . albuterol (VENTOLIN HFA) 108 (90 BASE) MCG/ACT inhaler Inhale 2 puffs into the lungs every 6 (six) hours as needed for wheezing.  1 Inhaler  0  . aspirin 81 MG tablet Take 81 mg by mouth daily.        [name] atorvastatin (LIPITOR) 40 MG tablet Take 1 tablet (40 mg total) by mouth daily.  90 tablet  3  . cetirizine (ZYRTEC) 10 MG tablet Take 10 mg by mouth at bedtime.        . metoprolol (LOPRESSOR) 50 MG tablet Take 75mg  (1 1/2 tabs) in the am and 50mg  (1 tab) in the pm  75 tablet  11  . Multiple Vitamin (MULTIVITAMIN) capsule Take 1 capsule by mouth daily.        . sertraline (ZOLOFT) 50 MG tablet Take 1 tablet (50 mg total) by mouth daily.  30 tablet  2  . SPIRIVA HANDIHALER 18 MCG inhalation capsule PLACE 1 CAPSULE INTO INHALER AND INHALE ONCE DAILY AS DIRECTED  30 capsule  3  . albuterol (PROVENTIL HFA;VENTOLIN HFA) 108 (90 BASE)  MCG/ACT inhaler Inhale 2 puffs into the lungs every 6 (six) hours as needed for wheezing.  1 Inhaler  0   No current facility-administered medications for this visit.    History   Social History  . Marital Status: Married    Spouse Name: N/A    Number of Children: 0  . 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 Smoking status: Former Smoker -- 2.00 packs/day for 32 years    Types: Cigarettes    Quit date: 08/31/1999  . Smokeless tobacco: Former Systems developer  . Alcohol Use: No  . Drug Use: No  . Sexual Activity: Not on file   Other Topics Concern  . Not on file   Social History Narrative  . No narrative on file    Family History  Problem Relation Age of Onset  . Angina Mother     Past Medical History  Diagnosis Date  . Hyperlipidemia   . Hypertension   . COPD (chronic obstructive pulmonary disease)     Significant Dr. Lamonte Sakai  . Diverticulosis of colon   .  Prostatitis   . CAD (coronary artery disease)     Catheterization, 2003, occluded OM1, good collaterals  //   nuclear, August, 2009, low risk, EF 60%  . GERD (gastroesophageal reflux disease)   . Carotid artery disease     Doppler, February, 2012, 5-57% R. ICA, 32-20% LICA,  plan followup 1 year  . Ejection fraction     EF 60%, nuclear, 2009    Past Surgical History  Procedure Laterality Date  . Knee arthroscopy      right    ROS  Patient denies fever, chills, headache, sweats, rash, change in vision, change in hearing, cough, nausea vomiting, urinary symptoms. All the systems are reviewed and are negative.  PHYSICAL EXAM BP 124/64  Pulse 65  Temp(Src) 98.2 F (36.8 C) (Oral)  Ht 5\' 9"  (1.753 m)  Wt 224 lb (101.606 kg)  BMI 33.06 kg/m2  SpO2 91% Wt Readings from Last 3 Encounters:  04/26/13 224 lb (101.606 kg)  12/28/12 236 lb 12.8 oz (107.412 kg)  06/17/12 230 lb (104.327 kg)   Gen:  Alert, pleasant, NAD Resp:  CTA  bilaterally CVS:  RRR Ext:  No edema  ASSESSMENT & PLAN

## 2013-04-26 NOTE — Patient Instructions (Signed)
Great to see you. I will call you with your lab results.   

## 2013-04-26 NOTE — Assessment & Plan Note (Signed)
Due for labs.  Recheck today. No changes to rx. On lipitor.

## 2013-04-26 NOTE — Progress Notes (Signed)
Pre-visit discussion using our clinic review tool. No additional management support is needed unless otherwise documented below in the visit note.  

## 2013-04-27 ENCOUNTER — Telehealth: Payer: Self-pay | Admitting: Family Medicine

## 2013-04-27 NOTE — Telephone Encounter (Signed)
Relevant patient education assigned to patient using Emmi. ° °

## 2013-04-28 ENCOUNTER — Encounter: Payer: Self-pay | Admitting: *Deleted

## 2013-05-05 ENCOUNTER — Other Ambulatory Visit: Payer: Self-pay | Admitting: Family Medicine

## 2013-06-17 ENCOUNTER — Other Ambulatory Visit: Payer: Self-pay

## 2013-06-17 ENCOUNTER — Other Ambulatory Visit: Payer: Self-pay | Admitting: Internal Medicine

## 2013-06-17 MED ORDER — METOPROLOL TARTRATE 50 MG PO TABS: ORAL_TABLET | ORAL | Status: DC

## 2013-06-17 NOTE — Telephone Encounter (Signed)
Last OV 04/26/2013--Last filled 02/18/2013 with 2 refills--please advise

## 2013-08-04 ENCOUNTER — Encounter: Payer: Self-pay | Admitting: Internal Medicine

## 2013-08-04 ENCOUNTER — Ambulatory Visit (INDEPENDENT_AMBULATORY_CARE_PROVIDER_SITE_OTHER): Payer: BC Managed Care – PPO | Admitting: Internal Medicine

## 2013-08-04 VITALS — BP 118/62 | HR 81 | Temp 98.4°F | Wt 222.5 lb

## 2013-08-04 DIAGNOSIS — J441 Chronic obstructive pulmonary disease with (acute) exacerbation: Secondary | ICD-10-CM

## 2013-08-04 MED ORDER — AZITHROMYCIN 250 MG PO TABS: ORAL_TABLET | ORAL | Status: DC

## 2013-08-04 MED ORDER — HYDROCODONE-HOMATROPINE 5-1.5 MG/5ML PO SYRP: 5.0000 mL | ORAL_SOLUTION | Freq: Three times a day (TID) | ORAL | Status: DC | PRN

## 2013-08-04 NOTE — Progress Notes (Signed)
Pre visit review using our clinic review tool, if applicable. No additional management support is needed unless otherwise documented below in the visit note. 

## 2013-08-04 NOTE — Progress Notes (Signed)
HPI  Pt presents to the clinic today with c/o cough, chest congestion and shortness of breath. He reports this started 2 weeks ago. The cough is productive of thick yellow mucous. He denies fever, chills or body aches. He has tried OTC nyquil without relief. He does have a history of COPD. He is on spriva and albuterol. He is not a current smoker. H has not had sick contacts that he is aware of.  Review of Systems      Past Medical History  Diagnosis Date  . Hyperlipidemia   . Hypertension   . COPD (chronic obstructive pulmonary disease)     Significant Dr. Lamonte Sakai  . Diverticulosis of colon   . Prostatitis   . CAD (coronary artery disease)     Catheterization, 2003, occluded OM1, good collaterals  //   nuclear, August, 2009, low risk, EF 60%  . GERD (gastroesophageal reflux disease)   . Carotid artery disease     Doppler, February, 2012, 7-12% R. ICA, 45-80% LICA,  plan followup 1 year  . Ejection fraction     EF 60%, nuclear, 2009    Family History  Problem Relation Age of Onset  . Angina Mother     History   Social History  . Marital Status: Married    Spouse Name: N/A    Number of Children: 0  . 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 Smoking status: Former Smoker -- 2.00 packs/day for 32 years    Types: Cigarettes    Quit date: 08/31/1999  . Smokeless tobacco: Former Systems developer  . Alcohol Use: No  . Drug Use: No  . Sexual Activity: Not on file   Other Topics Concern  . Not on file   Social History Narrative  . No narrative on file    Allergies  Allergen Reactions  . Aspirin     REACTION: Nausea; fine with 81mg   . Avelox [Moxifloxacin Hcl In Nacl]     Facial swelling  . Sulfamethoxazole-Trimethoprim     REACTION: sore mouth     Constitutional:  Denies headache, fatigue, fever or abrupt weight changes.  HEENT:  Positive sore throat. Denies eye redness, eye pain, pressure  behind the eyes, facial pain, nasal congestion, ear pain, ringing in the ears, wax buildup, runny nose or bloody nose. Respiratory: Positive cough. Denies difficulty breathing or shortness of breath.  Cardiovascular: Denies chest pain, chest tightness, palpitations or swelling in the hands or feet.   No other specific complaints in a complete review of systems (except as listed in HPI above).  Objective:   BP 118/62  Pulse 81  Temp(Src) 98.4 F (36.9 C) (Oral)  Wt 222 lb 8 oz (100.925 kg)  SpO2 93% Wt Readings from Last 3 Encounters:  08/04/13 222 lb 8 oz (100.925 kg)  04/26/13 224 lb (101.606 kg)  12/28/12 236 lb 12.8 oz (107.412 kg)     General: Appears his stated age, obese but well developed, well nourished in NAD. HEENT: Head: normal shape and size; Eyes: sclera white, no icterus, conjunctiva pink, PERRLA and EOMs intact; Ears: Tm's gray and intact, normal light reflex; Nose: mucosa pink and moist, septum midline; Throat/Mouth: + PND. Teeth present, mucosa erythematous and moist, no exudate noted, no lesions or ulcerations noted.  Neck: Mild cervical lymphadenopathy. Neck supple, trachea midline. No massses, lumps or thyromegaly present.  Cardiovascular: Normal rate and rhythm. S1,S2 noted.  Murmur noted. No rubs or gallops noted. No JVD or BLE edema. No carotid bruits noted. Pulmonary/Chest: Normal effort and scattered rhonchi and bilateral expiratory wheeze noted. No respiratory distress.      Assessment & Plan:   COPD exacerbation:  Get some rest and drink plenty of water Do salt water gargles for the sore throat eRx for Azithromax x 5 days Continue spiriva and albuterol RX for hycodan for cough  RTC as needed or if symptoms persist.

## 2013-08-04 NOTE — Patient Instructions (Addendum)
Chronic Obstructive Pulmonary Disease  Chronic obstructive pulmonary disease (COPD) is a common lung condition in which airflow from the lungs is limited. COPD is a general term that can be used to describe many different lung problems that limit airflow, including both chronic bronchitis and emphysema.  If you have COPD, your lung function will probably never return to normal, but there are measures you can take to improve lung function and make yourself feel better.   CAUSES   · Smoking (common).    · Exposure to secondhand smoke.    · Genetic problems.  · Chronic inflammatory lung diseases or recurrent infections.  SYMPTOMS   · Shortness of breath, especially with physical activity.    · Deep, persistent (chronic) cough with a large amount of thick mucus.    · Wheezing.    · Rapid breaths (tachypnea).    · Gray or bluish discoloration (cyanosis) of the skin, especially in fingers, toes, or lips.    · Fatigue.    · Weight loss.    · Frequent infections or episodes when breathing symptoms become much worse (exacerbations).    · Chest tightness.  DIAGNOSIS   Your healthcare provider will take a medical history and perform a physical examination to make the initial diagnosis.  Additional tests for COPD may include:   · Lung (pulmonary) function tests.  · Chest X-ray.  · CT scan.  · Blood tests.  TREATMENT   Treatment available to help you feel better when you have COPD include:   · Inhaler and nebulizer medicines. These help manage the symptoms of COPD and make your breathing more comfortable  · Supplemental oxygen. Supplemental oxygen is only helpful if you have a low oxygen level in your blood.    · Exercise and physical activity. These are beneficial for nearly all people with COPD. Some people may also benefit from a pulmonary rehabilitation program.  HOME CARE INSTRUCTIONS   · Take all medicines (inhaled or pills) as directed by your health care provider.  · Only take over-the-counter or prescription medicines  for pain, fever, or discomfort as directed by your health care provider.    · Avoid over-the-counter medicines or cough syrups that dry up your airway (such as antihistamines) and slow down the elimination of secretions unless instructed otherwise by your healthcare provider.    · If you are a smoker, the most important thing that you can do is stop smoking. Continuing to smoke will cause further lung damage and breathing trouble. Ask your health care provider for help with quitting smoking. He or she can direct you to community resources or hospitals that provide support.  · Avoid exposure to irritants such as smoke, chemicals, and fumes that aggravate your breathing.  · Use oxygen therapy and pulmonary rehabilitation if directed by your health care provider. If you require home oxygen therapy, ask your healthcare provider whether you should purchase a pulse oximeter to measure your oxygen level at home.    · Avoid contact with individuals who have a contagious illness.  · Avoid extreme temperature and humidity changes.  · Eat healthy foods. Eating smaller, more frequent meals and resting before meals may help you maintain your strength.  · Stay active, but balance activity with periods of rest. Exercise and physical activity will help you maintain your ability to do things you want to do.  · Preventing infection and hospitalization is very important when you have COPD. Make sure to receive all the vaccines your health care provider recommends, especially the pneumococcal and influenza vaccines. Ask your healthcare provider whether you   need a pneumonia vaccine.  · Learn and use relaxation techniques to manage stress.  · Learn and use controlled breathing techniques as directed by your health care provider. Controlled breathing techniques include:    · Pursed lip breathing. Start by breathing in (inhaling) through your nose for 1 second. Then, purse your lips as if you were going to whistle and breathe out (exhale)  through the pursed lips for 2 seconds.    · Diaphragmatic breathing. Start by putting one hand on your abdomen just above your waist. Inhale slowly through your nose. The hand on your abdomen should move out. Then purse your lips and exhale slowly. You should be able to feel the hand on your abdomen moving in as you exhale.    · Learn and use controlled coughing to clear mucus from your lungs. Controlled coughing is a series of short, progressive coughs. The steps of controlled coughing are:    1. Lean your head slightly forward.    2. Breathe in deeply using diaphragmatic breathing.    3. Try to hold your breath for 3 seconds.    4. Keep your mouth slightly open while coughing twice.    5. Spit any mucus out into a tissue.    6. Rest and repeat the steps once or twice as needed.  SEEK MEDICAL CARE IF:   · You are coughing up more mucus than usual.    · There is a change in the color or thickness of your mucus.    · Your breathing is more labored than usual.    · Your breathing is faster than usual.    SEEK IMMEDIATE MEDICAL CARE IF:   · You have shortness of breath while you are resting.    · You have shortness of breath that prevents you from:  · Being able to talk.    · Performing your usual physical activities.    · You have chest pain lasting longer than 5 minutes.    · Your skin color is more cyanotic than usual.  · You measure low oxygen saturations for longer than 5 minutes with a pulse oximeter.  MAKE SURE YOU:   · Understand these instructions.  · Will watch your condition.  · Will get help right away if you are not doing well or get worse.  Document Released: 12/26/2004 Document Revised: 01/06/2013 Document Reviewed: 11/12/2012  [employer]® Patient Information ©2014 [employer].

## 2013-08-10 ENCOUNTER — Other Ambulatory Visit: Payer: Self-pay

## 2013-08-10 MED ORDER — ATORVASTATIN CALCIUM 40 MG PO TABS: 40.0000 mg | ORAL_TABLET | Freq: Every day | ORAL | Status: DC

## 2013-11-15 ENCOUNTER — Other Ambulatory Visit: Payer: Self-pay | Admitting: Cardiology

## 2013-11-15 ENCOUNTER — Other Ambulatory Visit: Payer: Self-pay | Admitting: Family Medicine

## 2013-11-24 ENCOUNTER — Other Ambulatory Visit: Payer: Self-pay | Admitting: *Deleted

## 2013-11-24 MED ORDER — ATORVASTATIN CALCIUM 40 MG PO TABS: 40.0000 mg | ORAL_TABLET | Freq: Every day | ORAL | Status: DC

## 2013-12-21 ENCOUNTER — Ambulatory Visit (INDEPENDENT_AMBULATORY_CARE_PROVIDER_SITE_OTHER): Payer: BC Managed Care – PPO | Admitting: Family Medicine

## 2013-12-21 ENCOUNTER — Encounter: Payer: Self-pay | Admitting: Family Medicine

## 2013-12-21 VITALS — BP 116/66 | HR 73 | Temp 98.5°F | Wt 231.5 lb

## 2013-12-21 DIAGNOSIS — R5381 Other malaise: Secondary | ICD-10-CM

## 2013-12-21 DIAGNOSIS — R5383 Other fatigue: Secondary | ICD-10-CM

## 2013-12-21 DIAGNOSIS — J4489 Other specified chronic obstructive pulmonary disease: Secondary | ICD-10-CM

## 2013-12-21 DIAGNOSIS — J449 Chronic obstructive pulmonary disease, unspecified: Secondary | ICD-10-CM

## 2013-12-21 DIAGNOSIS — I10 Essential (primary) hypertension: Secondary | ICD-10-CM

## 2013-12-21 DIAGNOSIS — Z23 Encounter for immunization: Secondary | ICD-10-CM

## 2013-12-21 DIAGNOSIS — E782 Mixed hyperlipidemia: Secondary | ICD-10-CM

## 2013-12-21 LAB — CBC WITH DIFFERENTIAL/PLATELET
BASOS ABS: 0 10*3/uL (ref 0.0–0.1)
Basophils Relative: 0.2 % (ref 0.0–3.0)
EOS PCT: 4.3 % (ref 0.0–5.0)
Eosinophils Absolute: 0.3 10*3/uL (ref 0.0–0.7)
HCT: 45 % (ref 39.0–52.0)
Hemoglobin: 15.4 g/dL (ref 13.0–17.0)
Lymphocytes Relative: 30.1 % (ref 12.0–46.0)
Lymphs Abs: 2.1 10*3/uL (ref 0.7–4.0)
MCHC: 34.2 g/dL (ref 30.0–36.0)
MCV: 95.2 fl (ref 78.0–100.0)
MONO ABS: 0.7 10*3/uL (ref 0.1–1.0)
Monocytes Relative: 9.8 % (ref 3.0–12.0)
NEUTROS PCT: 55.6 % (ref 43.0–77.0)
Neutro Abs: 3.8 10*3/uL (ref 1.4–7.7)
PLATELETS: 164 10*3/uL (ref 150.0–400.0)
RBC: 4.73 Mil/uL (ref 4.22–5.81)
RDW: 13.4 % (ref 11.5–15.5)
WBC: 6.9 10*3/uL (ref 4.0–10.5)

## 2013-12-21 LAB — COMPREHENSIVE METABOLIC PANEL
ALBUMIN: 4.1 g/dL (ref 3.5–5.2)
ALT: 24 U/L (ref 0–53)
AST: 23 U/L (ref 0–37)
Alkaline Phosphatase: 55 U/L (ref 39–117)
BUN: 10 mg/dL (ref 6–23)
CO2: 26 mEq/L (ref 19–32)
Calcium: 9.1 mg/dL (ref 8.4–10.5)
Chloride: 106 mEq/L (ref 96–112)
Creatinine, Ser: 1 mg/dL (ref 0.4–1.5)
GFR: 77.95 mL/min (ref >60.00)
Glucose, Bld: 103 mg/dL — ABNORMAL HIGH (ref 70–99)
Potassium: 4 mEq/L (ref 3.5–5.1)
SODIUM: 139 meq/L (ref 135–145)
TOTAL PROTEIN: 7.1 g/dL (ref 6.0–8.3)
Total Bilirubin: 0.8 mg/dL (ref 0.2–1.2)

## 2013-12-21 LAB — LIPID PANEL
CHOL/HDL RATIO: 4
Cholesterol: 154 mg/dL (ref 0–200)
HDL: 39.2 mg/dL (ref >39.00)
LDL Cholesterol: 89 mg/dL (ref 0–99)
NONHDL: 114.8
Triglycerides: 127 mg/dL (ref 0.0–149.0)
VLDL: 25.4 mg/dL (ref 0.0–40.0)

## 2013-12-21 LAB — TESTOSTERONE: TESTOSTERONE: 216.02 ng/dL — AB (ref 300.00–890.00)

## 2013-12-21 LAB — VITAMIN D 25 HYDROXY (VIT D DEFICIENCY, FRACTURES): VITD: 38.41 ng/mL (ref 30.00–100.00)

## 2013-12-21 LAB — VITAMIN B12: Vitamin B-12: 290 pg/mL (ref 211–911)

## 2013-12-21 MED ORDER — ALBUTEROL SULFATE HFA 108 (90 BASE) MCG/ACT IN AERS
2.0000 | INHALATION_SPRAY | Freq: Four times a day (QID) | RESPIRATORY_TRACT | Status: DC | PRN
Start: 2013-12-21 — End: 2015-03-16

## 2013-12-21 NOTE — Assessment & Plan Note (Signed)
Followed by pulm. Continue current rxs.

## 2013-12-21 NOTE — Patient Instructions (Signed)
Great to see you.  We will call you with your lab results. 

## 2013-12-21 NOTE — Assessment & Plan Note (Signed)
With h/o CAD. Has been aggressively treated. Check cholesterol again today.

## 2013-12-21 NOTE — Progress Notes (Signed)
Pre visit review using our clinic review tool, if applicable. No additional management support is needed unless otherwise documented below in the visit note. 

## 2013-12-21 NOTE — Addendum Note (Signed)
Addended by: Modena Nunnery on: 12/21/2013 08:15 AM   Modules accepted: Orders

## 2013-12-21 NOTE — Assessment & Plan Note (Signed)
Well controlled on current rx. No changes made. 

## 2013-12-21 NOTE — Assessment & Plan Note (Signed)
New- upon questioning, he endorses these symptoms for over a year.  Likely multifactorial- traveling a lot and has baseline chronic medical conditions. Will check labs today to rule out other contributing factors. Orders Placed This Encounter  Procedures  . CBC with Differential  . Comprehensive metabolic panel  . Lipid panel  . Vitamin B12  . Testosterone  . Vitamin D, 25-hydroxy

## 2013-12-21 NOTE — Progress Notes (Signed)
Very pleasant 58 yo male with h/o CAD (followed by Dr. Ron Parker), HLD on atorvastatin here for rx refills/follow up.  Overall doing well.  Still traveling a lot for work. He has been more fatigued lately.  Sleeps well- wears his CPAP every night.   Always has some degree of CP or DOE- this is his baseline.  HLD-  Lab Results  Component Value Date   CHOL 145 04/26/2013   HDL 42.80 04/26/2013   LDLCALC 78 04/26/2013   LDLDIRECT 139.1 08/06/2007   TRIG 121.0 04/26/2013   CHOLHDL 3 04/26/2013   CAD- followed by Dr. Ron Parker.  Non smoker, non diabetic and he is active.  Cholesterol has been treated aggressively.  Has not seen Dr. Ron Parker since 05/22/12- note reviewed.   Lab Results  Component Value Date   PSA 1.03 04/26/2013   PSA 0.91 03/09/2012   PSA 1.06 05/03/2010   OSA- has CPAP, followed by Dr. Elsworth Soho. No CP or worsening SOB.    Last saw him on 12/28/12.  Note reviewed.  Treated with prednisone and zpack for COPD exacerbation.  Also saw Webb Silversmith in 07/2013 for COPD exacerbation.  Anxiety- symptoms well controlled on Zoloft.  He enjoys traveling with his job. Allergies  Allergen Reactions  . Aspirin     REACTION: Nausea; fine with 81mg   . Avelox [Moxifloxacin Hcl In Nacl]     Facial swelling  . Sulfamethoxazole-Trimethoprim     REACTION: sore mouth    Current Outpatient Prescriptions  Medication Sig Dispense Refill  . albuterol (VENTOLIN HFA) 108 (90 BASE) MCG/ACT inhaler Inhale 2 puffs into the lungs every 6 (six) hours as needed for wheezing.  1 Inhaler  0  . aspirin 81 MG tablet Take 81 mg by mouth daily.        [name] atorvastatin (LIPITOR) 40 MG tablet Take 1 tablet (40 mg total) by mouth daily.  90 tablet  1  . cetirizine (ZYRTEC) 10 MG tablet Take 10 mg by mouth at bedtime.        . metoprolol (LOPRESSOR) 50 MG tablet Take 75mg  (1 1/2 tabs) in the am and 50mg  (1 tab) in the pm  45 tablet  3  . Multiple Vitamin (MULTIVITAMIN) capsule Take 1 capsule by mouth daily.        . sertraline  (ZOLOFT) 50 MG tablet TAKE 1 TABLET BY MOUTH DAILY  30 tablet  0  . SPIRIVA HANDIHALER 18 MCG inhalation capsule PLACE 1 CAPSULE INTO INHALER AND INHALE ONCE DAILY AS DIRECTED  30 capsule  5   No current facility-administered medications for this visit.    History   Social History  . Marital Status: Married    Spouse Name: N/A    Number of Children: 0  . 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 Smoking status: Former Smoker -- 2.00 packs/day for 32 years    Types: Cigarettes    Quit date: 08/31/1999  . Smokeless tobacco: Former Systems developer  . Alcohol Use: No  . Drug Use: No  . Sexual Activity: Not on file   Other Topics Concern  . Not on file   Social History Narrative  . No narrative on file    Family History  Problem Relation Age of Onset  . Angina Mother     Past Medical History  Diagnosis Date  . Hyperlipidemia   . Hypertension   . COPD (chronic  obstructive pulmonary disease)     Significant Dr. Lamonte Sakai  . Diverticulosis of colon   . Prostatitis   . CAD (coronary artery disease)     Catheterization, 2003, occluded OM1, good collaterals  //   nuclear, August, 2009, low risk, EF 60%  . GERD (gastroesophageal reflux disease)   . Carotid artery disease     Doppler, February, 2012, 8-11% R. ICA, 57-26% LICA,  plan followup 1 year  . Ejection fraction     EF 60%, nuclear, 2009    Past Surgical History  Procedure Laterality Date  . Knee arthroscopy      right    ROS  Patient denies fever, chills, headache, sweats, rash, change in vision, change in hearing, cough, nausea vomiting, urinary symptoms. All the systems are reviewed and are negative.  PHYSICAL EXAM BP 116/66  Pulse 73  Temp(Src) 98.5 F (36.9 C) (Oral)  Wt 231 lb 8 oz (105.008 kg)  SpO2 92% Wt Readings from Last 3 Encounters:  12/21/13 231 lb 8 oz (105.008 kg)  08/04/13 222 lb 8 oz (100.925 kg)  04/26/13 224 lb  (101.606 kg)   Gen:  Alert, pleasant, NAD Resp:  CTA bilaterally CVS:  RRR Ext:  No edema Psych:  Good eye contact, not anxious or depressed appearing

## 2014-01-12 ENCOUNTER — Other Ambulatory Visit: Payer: Self-pay | Admitting: Family Medicine

## 2014-01-12 ENCOUNTER — Other Ambulatory Visit: Payer: Self-pay | Admitting: Cardiology

## 2014-03-16 ENCOUNTER — Other Ambulatory Visit: Payer: Self-pay | Admitting: Cardiology

## 2014-03-16 ENCOUNTER — Other Ambulatory Visit: Payer: Self-pay | Admitting: Family Medicine

## 2014-04-05 ENCOUNTER — Encounter: Payer: Self-pay | Admitting: Family Medicine

## 2014-04-05 ENCOUNTER — Ambulatory Visit (INDEPENDENT_AMBULATORY_CARE_PROVIDER_SITE_OTHER): Payer: BLUE CROSS/BLUE SHIELD | Admitting: Family Medicine

## 2014-04-05 VITALS — BP 136/72 | HR 86 | Temp 100.4°F | Wt 231.8 lb

## 2014-04-05 DIAGNOSIS — J441 Chronic obstructive pulmonary disease with (acute) exacerbation: Secondary | ICD-10-CM

## 2014-04-05 MED ORDER — HYDROCOD POLST-CHLORPHEN POLST 10-8 MG/5ML PO LQCR: 5.0000 mL | Freq: Every evening | ORAL | Status: DC | PRN

## 2014-04-05 MED ORDER — AZITHROMYCIN 250 MG PO TABS: ORAL_TABLET | ORAL | Status: DC

## 2014-04-05 NOTE — Progress Notes (Signed)
HPI  Pleasant 59 yo male with h/o COPD here for productive cough, chest congestion and shortness of breath x 4 days. H The cough is productive of thick yellow mucous. He has had a fever, chills and body aches. He has been taking his spriva and albuterol. He is not a current smoker. "everyone" at his place of employment has been sick.  Current Outpatient Prescriptions on File Prior to Visit  Medication Sig Dispense Refill  . albuterol (VENTOLIN HFA) 108 (90 BASE) MCG/ACT inhaler Inhale 2 puffs into the lungs every 6 (six) hours as needed for wheezing. 1 Inhaler 6  . aspirin 81 MG tablet Take 81 mg by mouth daily.      [name] atorvastatin (LIPITOR) 40 MG tablet Take 1 tablet (40 mg total) by mouth daily. 90 tablet 1  . cetirizine (ZYRTEC) 10 MG tablet Take 10 mg by mouth at bedtime.      . metoprolol (LOPRESSOR) 50 MG tablet TAKE 1 & 1/2 TABLETS EACH MORNING AND 1 TABLET EACH EVENING 75 tablet 0  . Multiple Vitamin (MULTIVITAMIN) capsule Take 1 capsule by mouth daily.      . sertraline (ZOLOFT) 50 MG tablet Take 1 tablet (50 mg total) by mouth daily. 30 tablet 1  . SPIRIVA HANDIHALER 18 MCG inhalation capsule PLACE 1 CAPSULE INTO INHALER AND INHALE ONCE DAILY AS DIRECTED 30 capsule 3   No current facility-administered medications on file prior to visit.    Allergies  Allergen Reactions  . Aspirin     REACTION: Nausea; fine with 81mg   . Avelox [Moxifloxacin Hcl In Nacl]     Facial swelling  . Sulfamethoxazole-Trimethoprim     REACTION: sore mouth    Past Medical History  Diagnosis Date  . Hyperlipidemia   . Hypertension   . COPD (chronic obstructive pulmonary disease)     Significant Dr. Lamonte Sakai  . Diverticulosis of colon   . Prostatitis   . CAD (coronary artery disease)     Catheterization, 2003, occluded OM1, good collaterals  //   nuclear, August, 2009, low risk, EF 60%  . GERD (gastroesophageal reflux disease)   . Carotid artery disease     Doppler, February, 2012, 0-39% R. ICA,  21-30% LICA,  plan followup 1 year  . Ejection fraction     EF 60%, nuclear, 2009    Past Surgical History  Procedure Laterality Date  . Knee arthroscopy      right    Family History  Problem Relation Age of Onset  . Angina Mother     History   Social History  . Marital Status: Married    Spouse Name: N/A    Number of Children: 0  . 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 Smoking status: Former Smoker -- 2.00 packs/day for 32 years    Types: Cigarettes    Quit date: 08/31/1999  . Smokeless tobacco: Former Systems developer  . Alcohol Use: No  . Drug Use: No  . Sexual Activity: Not on file   Other Topics Concern  . Not on file   Social History Narrative   The PMH, PSH, Social History, Family History, Medications, and allergies have been reviewed in [facility], and have been updated if relevant.   Review of System     Constitutional:  Denies headache, fatigue, fever or abrupt weight changes.  HEENT:  Positive sore throat. Denies eye redness, eye pain, pressure  behind the eyes, facial pain, nasal congestion, ear pain, ringing in the ears, wax buildup, runny nose or bloody nose. Respiratory: Positive cough. Denies difficulty breathing or shortness of breath.  Cardiovascular: Denies chest pain, chest tightness, palpitations or swelling in the hands or feet.   No other specific complaints in a complete review of systems (except as listed in HPI above).  Objective:   BP 136/72 mmHg  Pulse 86  Temp(Src) 100.4 F (38 C) (Oral)  Wt 231 lb 12 oz (105.121 kg)  SpO2 82% Wt Readings from Last 3 Encounters:  04/05/14 231 lb 12 oz (105.121 kg)  12/21/13 231 lb 8 oz (105.008 kg)  08/04/13 222 lb 8 oz (100.925 kg)     General: Appears his stated age, obese but well developed, well nourished in NAD. HEENT: Head: normal shape and size; Eyes: sclera white, no icterus, conjunctiva pink, PERRLA and EOMs intact;  Ears: Tm's gray and intact, normal light reflex; Nose: mucosa pink and moist, septum midline; Throat/Mouth: + PND. Teeth present, mucosa erythematous and moist, no exudate noted, no lesions or ulcerations noted.  Neck: Mild cervical lymphadenopathy. Neck supple, trachea midline. No massses, lumps or thyromegaly present.  Cardiovascular: Normal rate and rhythm. S1,S2 noted.  Murmur noted. No rubs or gallops noted. No JVD or BLE edema. No carotid bruits noted. Pulmonary/Chest: Normal effort and scattered rhonchi, left > right  and bilateral faint expiratory wheeze noted.  Psych:   Good eye contact.  Not anxious or depressed appearing      Patient ID: Hayden Deleon, male   DOB: 1955/11/11, 59 y.o.   MRN: [id number]

## 2014-04-05 NOTE — Assessment & Plan Note (Signed)
New- good air movement. Oral steroids not indicated at this time. Will treat with abx- zpack as directed, fluids and prn cough suppressant. eRx sent for zpack, rx given to pt for tussionex- he is aware of sedation potential.

## 2014-04-05 NOTE — Patient Instructions (Addendum)
Great to see you. Take Zpack as directed.  Try to get some rest, drink plenty of fluids.  Call me with an update later this week.

## 2014-04-05 NOTE — Progress Notes (Signed)
Pre visit review using our clinic review tool, if applicable. No additional management support is needed unless otherwise documented below in the visit note. 

## 2014-04-18 ENCOUNTER — Ambulatory Visit (INDEPENDENT_AMBULATORY_CARE_PROVIDER_SITE_OTHER): Payer: BLUE CROSS/BLUE SHIELD | Admitting: Family Medicine

## 2014-04-18 ENCOUNTER — Encounter: Payer: Self-pay | Admitting: Family Medicine

## 2014-04-18 VITALS — BP 118/70 | HR 56 | Temp 98.1°F | Wt 225.2 lb

## 2014-04-18 DIAGNOSIS — I10 Essential (primary) hypertension: Secondary | ICD-10-CM

## 2014-04-18 DIAGNOSIS — J441 Chronic obstructive pulmonary disease with (acute) exacerbation: Secondary | ICD-10-CM

## 2014-04-18 DIAGNOSIS — E785 Hyperlipidemia, unspecified: Secondary | ICD-10-CM

## 2014-04-18 MED ORDER — AZITHROMYCIN 250 MG PO TABS: ORAL_TABLET | ORAL | Status: DC

## 2014-04-18 NOTE — Progress Notes (Signed)
Very pleasant 59 yo male with h/o CAD (followed by Dr. Ron Parker), HLD on atorvastatin here for follow up.  Still traveling a lot for work. He has been more fatigued lately.  Sleeps well- wears his CPAP every night.   Always has some degree of CP or DOE- this is his baseline.  I saw him on 04/05/2014 for cough consistent with COPD exacerbation- treated with zpack, given rx for tussionex as well. Still not sleeping well.  Violent coughing spells improved.  Still productive.  Restarted cpap last night--was worsening his cough originally.  Appetite decreased. Wt Readings from Last 3 Encounters:  04/18/14 225 lb 4 oz (102.173 kg)  04/05/14 231 lb 12 oz (105.121 kg)  12/21/13 231 lb 8 oz (105.008 kg)     HLD-  Lab Results  Component Value Date   CHOL 154 12/21/2013   HDL 39.20 12/21/2013   LDLCALC 89 12/21/2013   LDLDIRECT 139.1 08/06/2007   TRIG 127.0 12/21/2013   CHOLHDL 4 12/21/2013   CAD- followed by Dr. Ron Parker.  Non smoker, non diabetic and he is active.  Cholesterol has been treated aggressively.  Has not seen Dr. Ron Parker since 05/22/12- note reviewed.   Lab Results  Component Value Date   PSA 1.03 04/26/2013   PSA 0.91 03/09/2012   PSA 1.06 05/03/2010   OSA- has CPAP, followed by Dr. Elsworth Soho. No CP or worsening SOB.    Last saw him on 12/28/12.  Note reviewed.  Treated with prednisone and zpack for COPD exacerbation.  Also saw Webb Silversmith in 07/2013 for COPD exacerbation.  Anxiety- symptoms well controlled on Zoloft.  He enjoys traveling with his job. Allergies  Allergen Reactions  . Aspirin     REACTION: Nausea; fine with 81mg   . Avelox [Moxifloxacin Hcl In Nacl]     Facial swelling  . Sulfamethoxazole-Trimethoprim     REACTION: sore mouth    Current Outpatient Prescriptions  Medication Sig Dispense Refill  . albuterol (VENTOLIN HFA) 108 (90 BASE) MCG/ACT inhaler Inhale 2 puffs into the lungs every 6 (six) hours as needed for wheezing. 1 Inhaler 6  . aspirin 81 MG tablet Take  81 mg by mouth daily.      [name] atorvastatin (LIPITOR) 40 MG tablet Take 1 tablet (40 mg total) by mouth daily. 90 tablet 1  . cetirizine (ZYRTEC) 10 MG tablet Take 10 mg by mouth at bedtime.      . metoprolol (LOPRESSOR) 50 MG tablet TAKE 1 & 1/2 TABLETS EACH MORNING AND 1 TABLET EACH EVENING 75 tablet 0  . Multiple Vitamin (MULTIVITAMIN) capsule Take 1 capsule by mouth daily.      . sertraline (ZOLOFT) 50 MG tablet Take 1 tablet (50 mg total) by mouth daily. 30 tablet 1  . SPIRIVA HANDIHALER 18 MCG inhalation capsule PLACE 1 CAPSULE INTO INHALER AND INHALE ONCE DAILY AS DIRECTED 30 capsule 3  . chlorpheniramine-HYDROcodone (TUSSIONEX PENNKINETIC ER) 10-8 MG/5ML LQCR Take 5 mLs by mouth at bedtime as needed. (Patient not taking: Reported on 04/18/2014) 140 mL 0   No current facility-administered medications for this visit.    History   Social History  . Marital Status: Married    Spouse Name: N/A    Number of Children: 0  . 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 Smoking status: Former Smoker -- 2.00 packs/day for 32 years    Types: Cigarettes  Quit date: 08/31/1999  . Smokeless tobacco: Former Systems developer  . Alcohol Use: No  . Drug Use: No  . Sexual Activity: Not on file   Other Topics Concern  . Not on file   Social History Narrative    Family History  Problem Relation Age of Onset  . Angina Mother     Past Medical History  Diagnosis Date  . Hyperlipidemia   . Hypertension   . COPD (chronic obstructive pulmonary disease)     Significant Dr. Lamonte Sakai  . Diverticulosis of colon   . Prostatitis   . CAD (coronary artery disease)     Catheterization, 2003, occluded OM1, good collaterals  //   nuclear, August, 2009, low risk, EF 60%  . GERD (gastroesophageal reflux disease)   . Carotid artery disease     Doppler, February, 2012, 4-69% R. ICA, 62-95% LICA,  plan followup 1 year  . Ejection fraction      EF 60%, nuclear, 2009    Past Surgical History  Procedure Laterality Date  . Knee arthroscopy      right    ROS  No fever +productive cough No CP + fatigue + decreased appetite +DOE No LE edema No rashes No HA   PHYSICAL EXAM BP 118/70 mmHg  Pulse 56  Temp(Src) 98.1 F (36.7 C) (Oral)  Wt 225 lb 4 oz (102.173 kg)  SpO2 93% Wt Readings from Last 3 Encounters:  04/18/14 225 lb 4 oz (102.173 kg)  04/05/14 231 lb 12 oz (105.121 kg)  12/21/13 231 lb 8 oz (105.008 kg)    General:  overweght male in NAD Eyes:  PERRL Ears:  External ear exam shows no significant lesions or deformities.  Otoscopic examination reveals clear canals, tympanic membranes are intact bilaterally without bulging, retraction, inflammation or discharge. Hearing is grossly normal bilaterally. Nose:  External nasal examination shows no deformity or inflammation. Nasal mucosa are pink and moist without lesions or exudates. Mouth:  Oral mucosa and oropharynx without lesions or exudates.  Teeth in good repair. Neck:  no carotid bruit or thyromegaly no cervical or supraclavicular lymphadenopathy  Lungs:  Normal respiratory effort, chest expands symmetrically. Lungs are clear to auscultation, no crackles or wheezes. Heart:  Normal rate  Abdomen:  Bowel sounds positive,abdomen soft and non-tender without masses, organomegaly or hernias noted. Pulses:  R and L posterior tibial pulses are full and equal bilaterally  Extremities:  no edema

## 2014-04-18 NOTE — Assessment & Plan Note (Signed)
Improved but still quite symptomatic. Allergic to quinolones.  Will treat with repeat zpack, continue supportive care. Call or return to clinic prn if these symptoms worsen or fail to improve as anticipated. The patient indicates understanding of these issues and agrees with the plan.

## 2014-04-18 NOTE — Progress Notes (Signed)
Pre visit review using our clinic review tool, if applicable. No additional management support is needed unless otherwise documented below in the visit note. 

## 2014-04-18 NOTE — Assessment & Plan Note (Signed)
Well controlled.  No changes made. 

## 2014-05-25 ENCOUNTER — Other Ambulatory Visit: Payer: Self-pay | Admitting: Family Medicine

## 2014-05-26 NOTE — Telephone Encounter (Signed)
Ok to refill? Zoloft 50 mg. Last refill on 03/17/14. Last seen on 04/18/14.

## 2014-05-26 NOTE — Telephone Encounter (Signed)
Called in rx to [facility].

## 2014-06-01 ENCOUNTER — Other Ambulatory Visit (HOSPITAL_COMMUNITY): Payer: Self-pay | Admitting: Cardiology

## 2014-06-01 DIAGNOSIS — I6523 Occlusion and stenosis of bilateral carotid arteries: Secondary | ICD-10-CM

## 2014-06-07 ENCOUNTER — Ambulatory Visit (HOSPITAL_COMMUNITY): Payer: BLUE CROSS/BLUE SHIELD | Attending: Cardiology | Admitting: Cardiology

## 2014-06-07 DIAGNOSIS — I6523 Occlusion and stenosis of bilateral carotid arteries: Secondary | ICD-10-CM

## 2014-06-07 NOTE — Progress Notes (Signed)
Carotid duplex performed 

## 2014-07-19 ENCOUNTER — Other Ambulatory Visit: Payer: Self-pay | Admitting: Family Medicine

## 2014-08-24 ENCOUNTER — Other Ambulatory Visit: Payer: Self-pay | Admitting: Cardiology

## 2014-08-25 NOTE — Telephone Encounter (Signed)
Please give the pt enough for 5 day supply and leave measage with pharmacy that the pt must contact this office for more refills. Thanks.

## 2014-08-25 NOTE — Telephone Encounter (Signed)
Patient has not had an ov with Dr Ron Parker in over two years and has had multiple warnings to call and schedule an appointment for further refills. Please advise. Thanks, MI

## 2014-10-07 ENCOUNTER — Encounter: Payer: Self-pay | Admitting: Cardiology

## 2014-10-07 ENCOUNTER — Ambulatory Visit (INDEPENDENT_AMBULATORY_CARE_PROVIDER_SITE_OTHER): Payer: BLUE CROSS/BLUE SHIELD | Admitting: Cardiology

## 2014-10-07 VITALS — BP 118/68 | HR 50 | Ht 70.0 in | Wt 239.0 lb

## 2014-10-07 DIAGNOSIS — I739 Peripheral vascular disease, unspecified: Secondary | ICD-10-CM

## 2014-10-07 DIAGNOSIS — I779 Disorder of arteries and arterioles, unspecified: Secondary | ICD-10-CM | POA: Diagnosis not present

## 2014-10-07 DIAGNOSIS — I2510 Atherosclerotic heart disease of native coronary artery without angina pectoris: Secondary | ICD-10-CM

## 2014-10-07 DIAGNOSIS — J449 Chronic obstructive pulmonary disease, unspecified: Secondary | ICD-10-CM

## 2014-10-07 DIAGNOSIS — J441 Chronic obstructive pulmonary disease with (acute) exacerbation: Secondary | ICD-10-CM | POA: Insufficient documentation

## 2014-10-07 MED ORDER — METOPROLOL TARTRATE 50 MG PO TABS: ORAL_TABLET | ORAL | Status: DC

## 2014-10-07 NOTE — Assessment & Plan Note (Signed)
Patient has significant COPD that is followed by pulmonary. No further workup.

## 2014-10-07 NOTE — Assessment & Plan Note (Addendum)
Catheterization in 2003 revealed occluded OM1 with good collaterals. Nuclear stress study in 2009 was low risk. Nuclear stress study in March, 2014 revealed evidence of a small scar with mild. Infarct ischemia. The EF was 63%. The study was low risk. He is not having any significant symptoms at this time. He is on appropriate medications. No further workup at this time.[name]

## 2014-10-07 NOTE — Progress Notes (Signed)
Cardiology Office Note   Date:  10/07/2014   ID:  Duriel, Deery [birth year], MRN [id number]  PCP:  Arnette Norris, MD  Cardiologist:  Dola Argyle, MD   Chief Complaint  Patient presents with  . Appointment    Follow-up coronary artery disease      History of Present Illness: Hayden Deleon is a 59 y.o. male who presents today to follow-up coronary artery disease. I saw him last February, 2014. After that time he had a nuclear stress study in March, 2014. There was no significant ischemia. He has good LV function. He has significant lung disease. During the day sometimes at rest he feels the question of palpitations immediately followed by cough. This does not sound like a significant arrhythmia. Overall he's stable. He has not had syncope or presyncope.    Past Medical History  Diagnosis Date  . Hyperlipidemia   . Hypertension   . COPD (chronic obstructive pulmonary disease)     Significant Dr. Lamonte Sakai  . Diverticulosis of colon   . Prostatitis   . CAD (coronary artery disease)     Catheterization, 2003, occluded OM1, good collaterals  //   nuclear, August, 2009, low risk, EF 60%  . GERD (gastroesophageal reflux disease)   . Carotid artery disease     Doppler, February, 2012, 1-74% R. ICA, 08-14% LICA,  plan followup 1 year  . Ejection fraction     EF 60%, nuclear, 2009    Past Surgical History  Procedure Laterality Date  . Knee arthroscopy      right    Patient Active Problem List   Diagnosis Date Noted  . Other malaise and fatigue 12/21/2013  . OSA (obstructive sleep apnea) 05/01/2012  . CAD (coronary artery disease)   . Carotid artery disease   . GERD (gastroesophageal reflux disease)   . Ejection fraction   . MIXED HYPERLIPIDEMIA 05/03/2010  . BENIGN POSITIONAL VERTIGO 05/03/2010  . ANXIETY 11/10/2008  . ANGIOEDEMA 08/18/2007  . SYSTOLIC MURMUR 48/18/5631  . DYSPHAGIA UNSPECIFIED 05/22/2007  . PROSTATITIS, HX OF 05/22/2007  . COPD exacerbation  05/15/2007  . ADVEF, DRUG/MEDICINAL/BIOLOGICAL SUBST NOS 01/26/2007  . COLONIC POLYPS 12/31/2003  . GASTROESOPHAGEAL REFLUX DISEASE, CHRONIC 12/31/2003  . HIATAL HERNIA 12/31/2003  . DIVERTICULOSIS, COLON 12/31/2003  . HLD (hyperlipidemia) 05/02/2001  . HTN (hypertension) 05/02/2001      Current Outpatient Prescriptions  Medication Sig Dispense Refill  . albuterol (VENTOLIN HFA) 108 (90 BASE) MCG/ACT inhaler Inhale 2 puffs into the lungs every 6 (six) hours as needed for wheezing. 1 Inhaler 6  . aspirin 81 MG tablet Take 81 mg by mouth daily.      [name] atorvastatin (LIPITOR) 40 MG tablet TAKE 1 TABLET BY MOUTH DAILY 30 tablet 0  . cetirizine (ZYRTEC) 10 MG tablet Take 10 mg by mouth at bedtime.      . chlorpheniramine-HYDROcodone (TUSSIONEX PENNKINETIC ER) 10-8 MG/5ML LQCR Take 5 mLs by mouth at bedtime as needed. 140 mL 0  . CLOMIPHENE CITRATE PO Take 30 mg by mouth daily.    . metoprolol (LOPRESSOR) 50 MG tablet TAKE 1 & 1/2 TABLETS BY MOUTH EACH MORNING AND 1 TABLET EACH EVENING 13 tablet 0  . Multiple Vitamin (MULTIVITAMIN) capsule Take 1 capsule by mouth daily.      . sertraline (ZOLOFT) 50 MG tablet TAKE 1 TABLET BY MOUTH DAILY 30 tablet 5  . SPIRIVA HANDIHALER 18 MCG inhalation capsule PLACE 1 CAPSULE INTO INHALER AND INHALE ONCE DAILY AS  DIRECTED 30 capsule 3   No current facility-administered medications for this visit.    Allergies:   Avelox; Sulfamethoxazole-trimethoprim; and Aspirin    Social History:  The patient  reports that he quit smoking about 15 years ago. His smoking use included Cigarettes. He has a 64 pack-year smoking history. He has quit using smokeless tobacco. He reports that he does not drink alcohol or use illicit drugs.   Family History:  The patient's family history includes Angina in his mother; Asthma in his father; Crohn's disease in his sister.    ROS:  Please see the history of present illness. Patient denies fever, chills, headache, sweats, rash,  change in vision, change in hearing, chest pain, nausea or vomiting, urinary symptoms. All other systems are reviewed and are negative.   PHYSICAL EXAM: VS:  BP 118/68 mmHg  Pulse 50  Ht 5\' 10"  (1.778 m)  Wt 239 lb (108.41 kg)  BMI 34.29 kg/m2 , The patient is oriented to person time and place. Affect is normal. He is overweight. Head is atraumatic. Sclera and conjunctiva are normal. There is no jugular venous distention. Lungs are clear. Respiratory effort is nonlabored. Cardiac exam reveals an S1 and S2. The abdomen is soft. There is no peripheral edema. There are no musculoskeletal deformities. There are no skin rashes. Neurologic is grossly intact.  EKG:   EKG is done today and reviewed by me. There is mild sinus bradycardia. There are no significant ST changes.  Recent Labs: 12/21/2013: ALT 24; BUN 10; Creatinine, Ser 1.0; Hemoglobin 15.4; Platelets 164.0; Potassium 4.0; Sodium 139    Lipid Panel    Component Value Date/Time   CHOL 154 12/21/2013 0805   TRIG 127.0 12/21/2013 0805   HDL 39.20 12/21/2013 0805   CHOLHDL 4 12/21/2013 0805   VLDL 25.4 12/21/2013 0805   LDLCALC 89 12/21/2013 0805   LDLDIRECT 139.1 08/06/2007 0928      Wt Readings from Last 3 Encounters:  10/07/14 239 lb (108.41 kg)  04/18/14 225 lb 4 oz (102.173 kg)  04/05/14 231 lb 12 oz (105.121 kg)      Current medicines are reviewed  The patient understands his medications.     ASSESSMENT AND PLAN:

## 2014-10-07 NOTE — Assessment & Plan Note (Signed)
Patient has carotid disease. Most recent Doppler in March, 2016 revealed stable disease. Follow-up is recommended in one year.

## 2014-10-07 NOTE — Patient Instructions (Signed)
**Note De-identified Rodolphe Edmonston Obfuscation** Medication Instructions:  Same-no change  Labwork: None  Testing/Procedures: None  Follow-Up: Your physician wants you to follow-up in: 1 year. You will receive a reminder letter in the mail two months in advance. If you don't receive a letter, please call our office to schedule the follow-up appointment.      

## 2014-12-09 ENCOUNTER — Other Ambulatory Visit: Payer: Self-pay | Admitting: Family Medicine

## 2015-02-20 ENCOUNTER — Other Ambulatory Visit: Payer: Self-pay | Admitting: Family Medicine

## 2015-02-22 NOTE — Telephone Encounter (Signed)
Last lipid 11/2013

## 2015-02-24 NOTE — Telephone Encounter (Signed)
Ok to refill one time only.  Needs labs for further refills. 

## 2015-03-13 ENCOUNTER — Ambulatory Visit: Payer: BLUE CROSS/BLUE SHIELD | Admitting: Family Medicine

## 2015-03-14 ENCOUNTER — Ambulatory Visit: Payer: BLUE CROSS/BLUE SHIELD | Admitting: Family Medicine

## 2015-03-16 ENCOUNTER — Ambulatory Visit (INDEPENDENT_AMBULATORY_CARE_PROVIDER_SITE_OTHER): Payer: BLUE CROSS/BLUE SHIELD | Admitting: Family Medicine

## 2015-03-16 ENCOUNTER — Encounter: Payer: Self-pay | Admitting: Family Medicine

## 2015-03-16 VITALS — BP 128/62 | HR 53 | Temp 98.2°F | Wt 230.5 lb

## 2015-03-16 DIAGNOSIS — E785 Hyperlipidemia, unspecified: Secondary | ICD-10-CM

## 2015-03-16 DIAGNOSIS — J441 Chronic obstructive pulmonary disease with (acute) exacerbation: Secondary | ICD-10-CM | POA: Diagnosis not present

## 2015-03-16 DIAGNOSIS — G4733 Obstructive sleep apnea (adult) (pediatric): Secondary | ICD-10-CM

## 2015-03-16 DIAGNOSIS — I10 Essential (primary) hypertension: Secondary | ICD-10-CM

## 2015-03-16 DIAGNOSIS — Z23 Encounter for immunization: Secondary | ICD-10-CM | POA: Diagnosis not present

## 2015-03-16 DIAGNOSIS — J449 Chronic obstructive pulmonary disease, unspecified: Secondary | ICD-10-CM

## 2015-03-16 DIAGNOSIS — F411 Generalized anxiety disorder: Secondary | ICD-10-CM

## 2015-03-16 DIAGNOSIS — Z125 Encounter for screening for malignant neoplasm of prostate: Secondary | ICD-10-CM | POA: Diagnosis not present

## 2015-03-16 LAB — COMPREHENSIVE METABOLIC PANEL
ALT: 18 U/L (ref 0–53)
AST: 17 U/L (ref 0–37)
Albumin: 4 g/dL (ref 3.5–5.2)
Alkaline Phosphatase: 39 U/L (ref 39–117)
BUN: 11 mg/dL (ref 6–23)
CO2: 32 mEq/L (ref 19–32)
Calcium: 9.1 mg/dL (ref 8.4–10.5)
Chloride: 106 mEq/L (ref 96–112)
Creatinine, Ser: 1.09 mg/dL (ref 0.40–1.50)
GFR: 73.52 mL/min (ref >60.00)
GLUCOSE: 89 mg/dL (ref 70–99)
POTASSIUM: 4.5 meq/L (ref 3.5–5.1)
Sodium: 144 mEq/L (ref 135–145)
Total Bilirubin: 0.6 mg/dL (ref 0.2–1.2)
Total Protein: 6.3 g/dL (ref 6.0–8.3)

## 2015-03-16 LAB — LIPID PANEL
Cholesterol: 119 mg/dL (ref 0–200)
HDL: 41.6 mg/dL (ref >39.00)
LDL Cholesterol: 57 mg/dL (ref 0–99)
NONHDL: 77.07
Total CHOL/HDL Ratio: 3
Triglycerides: 101 mg/dL (ref 0.0–149.0)
VLDL: 20.2 mg/dL (ref 0.0–40.0)

## 2015-03-16 LAB — PSA: PSA: 1.42 ng/mL (ref 0.10–4.00)

## 2015-03-16 MED ORDER — ALBUTEROL SULFATE HFA 108 (90 BASE) MCG/ACT IN AERS: 2.0000 | INHALATION_SPRAY | Freq: Four times a day (QID) | RESPIRATORY_TRACT | Status: DC | PRN

## 2015-03-16 MED ORDER — ATORVASTATIN CALCIUM 40 MG PO TABS: 40.0000 mg | ORAL_TABLET | Freq: Every day | ORAL | Status: DC

## 2015-03-16 NOTE — Assessment & Plan Note (Signed)
With no recent exacerbations. Pneumovax and influenza vaccines given today. No changes in rxs.

## 2015-03-16 NOTE — Assessment & Plan Note (Signed)
Well controlled.  No changes made. 

## 2015-03-16 NOTE — Addendum Note (Signed)
Addended by: Modena Nunnery on: 03/16/2015 08:40 AM   Modules accepted: Orders

## 2015-03-16 NOTE — Assessment & Plan Note (Signed)
Followed by pulmonary. Compliant with CPAP. 

## 2015-03-16 NOTE — Progress Notes (Signed)
Very pleasant 59 yo male with h/o CAD (followed by Dr. Ron Parker), HLD on atorvastatin here for rx refills/follow up.  Overall doing well.  Still traveling a lot for work. He has been more fatigued lately.  Sleeps well- wears his CPAP every night.   Always has some degree of CP or DOE- this is his baseline.  HLD-  Lab Results  Component Value Date   CHOL 154 12/21/2013   HDL 39.20 12/21/2013   LDLCALC 89 12/21/2013   LDLDIRECT 139.1 08/06/2007   TRIG 127.0 12/21/2013   CHOLHDL 4 12/21/2013   CAD- followed by Dr. Ron Parker.  Non smoker, non diabetic and he is active.    Lab Results  Component Value Date   PSA 1.03 04/26/2013   PSA 0.91 03/09/2012   PSA 1.06 05/03/2010   OSA and COPD- breathing worse this time of year, but no recent exacerbations.  Anxiety- symptoms well controlled on Zoloft.   Allergies  Allergen Reactions  . Avelox [Moxifloxacin Hcl In Nacl] Other (See Comments)    Facial swelling  . Sulfamethoxazole-Trimethoprim Other (See Comments)    REACTION: sore mouth  . Aspirin Other (See Comments)    REACTION: Nausea; fine with 81mg     Current Outpatient Prescriptions  Medication Sig Dispense Refill  . albuterol (VENTOLIN HFA) 108 (90 BASE) MCG/ACT inhaler Inhale 2 puffs into the lungs every 6 (six) hours as needed for wheezing. 1 Inhaler 6  . aspirin 81 MG tablet Take 81 mg by mouth daily.      [name] atorvastatin (LIPITOR) 40 MG tablet Take 1 tablet (40 mg total) by mouth daily. 30 tablet 5  . cetirizine (ZYRTEC) 10 MG tablet Take 10 mg by mouth at bedtime.      [name] CLOMIPHENE CITRATE PO Take 30 mg by mouth daily.    . metoprolol (LOPRESSOR) 50 MG tablet TAKE 1 & 1/2 TABLETS BY MOUTH EACH MORNING AND 1 TABLET EACH EVENING 75 tablet 11  . Multiple Vitamin (MULTIVITAMIN) capsule Take 1 capsule by mouth daily.      . sertraline (ZOLOFT) 50 MG tablet TAKE 1 TABLET BY MOUTH DAILY 30 tablet 5  . SPIRIVA HANDIHALER 18 MCG inhalation capsule PLACE 1 CAPSULE INTO INHALER AND INHALE  ONCE DAILY AS DIRECTED 30 capsule 11   No current facility-administered medications for this visit.    Social History   Social History  . Marital Status: Married    Spouse Name: N/A  . Number of Children: 0  . 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 Smoking status: Former Smoker -- 2.00 packs/day for 32 years    Types: Cigarettes    Quit date: 08/31/1999  . Smokeless tobacco: Former Systems developer  . Alcohol Use: No  . Drug Use: No  . Sexual Activity: Not on file   Other Topics Concern  . Not on file   Social History Narrative    Family History  Problem Relation Age of Onset  . Angina Mother     car accident  . Asthma Father   . Crohn's disease Sister     Past Medical History  Diagnosis Date  . Hyperlipidemia   . Hypertension   . COPD (chronic obstructive pulmonary disease) (HCC)     Significant Dr. Lamonte Sakai  . Diverticulosis of colon   . Prostatitis   . CAD (coronary artery disease)     Catheterization, 2003, occluded OM1, good collaterals  //  nuclear, August, 2009, low risk, EF 60%  . GERD (gastroesophageal reflux disease)   . Carotid artery disease ([place])     Doppler, February, 2012, [id number] R. ICA, [id number] LICA,  plan followup 1 year  . Ejection fraction     EF 60%, nuclear, 2009    Past Surgical History  Procedure Laterality Date  . Knee arthroscopy      right    Review of Systems  Constitutional: Negative.   HENT: Negative.   Respiratory: Positive for shortness of breath. Negative for choking, wheezing and stridor.   Gastrointestinal: Negative.   Musculoskeletal: Negative.   Skin: Negative.   Allergic/Immunologic: Negative.   Neurological: Negative.   Hematological: Negative.   Psychiatric/Behavioral: Negative.   All other systems reviewed and are negative.    PHYSICAL EXAM BP 128/62 mmHg  Pulse 53  Temp(Src) 98.2 F (36.8 C) (Oral)  Wt 230 lb 8 oz (104.554 kg)   SpO2 98% Wt Readings from Last 3 Encounters:  03/16/15 230 lb 8 oz (104.554 kg)  10/07/14 239 lb (108.41 kg)  04/18/14 225 lb 4 oz (102.173 kg)   Physical Exam  Constitutional: He is well-developed, well-nourished, and in no distress. No distress.  HENT:  Head: Normocephalic.  Eyes: Conjunctivae are normal.  Neck: Normal range of motion.  Cardiovascular: Normal rate and regular rhythm.   Pulmonary/Chest: Breath sounds normal.  Musculoskeletal: Normal range of motion.  Neurological: He is alert.  Skin: Skin is warm and dry. He is not diaphoretic.  Psychiatric: Mood, memory, affect and judgment normal.  Nursing note and vitals reviewed.

## 2015-03-16 NOTE — Patient Instructions (Signed)
Great to see you. Happy Holidays.  We will call you with your lab results. 

## 2015-03-16 NOTE — Assessment & Plan Note (Signed)
Due for lipid panel today. Lipitor rx refilled.

## 2015-03-16 NOTE — Progress Notes (Signed)
Pre visit review using our clinic review tool, if applicable. No additional management support is needed unless otherwise documented below in the visit note. 

## 2015-03-16 NOTE — Assessment & Plan Note (Signed)
Well controlled on current dose of zoloft. No changes made today. 

## 2015-03-17 ENCOUNTER — Encounter: Payer: Self-pay | Admitting: *Deleted

## 2015-04-19 ENCOUNTER — Encounter: Payer: Self-pay | Admitting: Family Medicine

## 2015-04-19 ENCOUNTER — Ambulatory Visit (INDEPENDENT_AMBULATORY_CARE_PROVIDER_SITE_OTHER): Payer: BLUE CROSS/BLUE SHIELD | Admitting: Family Medicine

## 2015-04-19 VITALS — BP 140/78 | HR 65 | Temp 99.3°F | Wt 227.5 lb

## 2015-04-19 DIAGNOSIS — J441 Chronic obstructive pulmonary disease with (acute) exacerbation: Secondary | ICD-10-CM | POA: Diagnosis not present

## 2015-04-19 MED ORDER — AZITHROMYCIN 250 MG PO TABS: ORAL_TABLET | ORAL | Status: DC

## 2015-04-19 MED ORDER — HYDROCOD POLST-CPM POLST ER 10-8 MG/5ML PO SUER: 5.0000 mL | Freq: Two times a day (BID) | ORAL | Status: DC | PRN

## 2015-04-19 NOTE — Progress Notes (Signed)
Pre visit review using our clinic review tool, if applicable. No additional management support is needed unless otherwise documented below in the visit note. 

## 2015-04-19 NOTE — Assessment & Plan Note (Signed)
Treat with zpack, continue inhalers as directed. tussionex give for severe cough at night. Call or return to clinic prn if these symptoms worsen or fail to improve as anticipated. The patient indicates understanding of these issues and agrees with the plan.

## 2015-04-19 NOTE — Progress Notes (Signed)
HPI  Pleasant 60 yo male with h/o COPD here for productive cough, chest congestion and shortness of breath x 8 days.  The cough is productive of thick yellow mucous. He has had a fever, chills and body aches. He has been taking his spriva and albuterol. He is not a current smoker.  Current Outpatient Prescriptions on File Prior to Visit  Medication Sig Dispense Refill  . albuterol (VENTOLIN HFA) 108 (90 BASE) MCG/ACT inhaler Inhale 2 puffs into the lungs every 6 (six) hours as needed for wheezing. 1 Inhaler 6  . aspirin 81 MG tablet Take 81 mg by mouth daily.      [name] atorvastatin (LIPITOR) 40 MG tablet Take 1 tablet (40 mg total) by mouth daily. 30 tablet 5  . cetirizine (ZYRTEC) 10 MG tablet Take 10 mg by mouth at bedtime.      [name] CLOMIPHENE CITRATE PO Take 30 mg by mouth daily.    . metoprolol (LOPRESSOR) 50 MG tablet TAKE 1 & 1/2 TABLETS BY MOUTH EACH MORNING AND 1 TABLET EACH EVENING 75 tablet 11  . Multiple Vitamin (MULTIVITAMIN) capsule Take 1 capsule by mouth daily.      . sertraline (ZOLOFT) 50 MG tablet TAKE 1 TABLET BY MOUTH DAILY 30 tablet 5  . SPIRIVA HANDIHALER 18 MCG inhalation capsule PLACE 1 CAPSULE INTO INHALER AND INHALE ONCE DAILY AS DIRECTED 30 capsule 11   No current facility-administered medications on file prior to visit.    Allergies  Allergen Reactions  . Avelox [Moxifloxacin Hcl In Nacl] Other (See Comments)    Facial swelling  . Sulfamethoxazole-Trimethoprim Other (See Comments)    REACTION: sore mouth  . Aspirin Other (See Comments)    REACTION: Nausea; fine with 81mg     Past Medical History  Diagnosis Date  . Hyperlipidemia   . Hypertension   . COPD (chronic obstructive pulmonary disease) (HCC)     Significant Dr. Lamonte Sakai  . Diverticulosis of colon   . Prostatitis   . CAD (coronary artery disease)     Catheterization, 2003, occluded OM1, good collaterals  //   nuclear, August, 2009, low risk, EF 60%  . GERD (gastroesophageal reflux disease)   . Carotid  artery disease ([place])     Doppler, February, 2012, [id number] R. ICA, [id number] LICA,  plan followup 1 year  . Ejection fraction     EF 60%, nuclear, 2009    Past Surgical History  Procedure Laterality Date  . Knee arthroscopy      right    Family History  Problem Relation Age of Onset  . Angina Mother     car accident  . Asthma Father   . Crohn's disease Sister     Social History   Social History  . Marital Status: Married    Spouse Name: N/A  . Number of Children: 0  . 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 Smoking status: Former Smoker -- 2.00 packs/day for 32 years    Types: Cigarettes    Quit date: 08/31/1999  . Smokeless tobacco: Former Systems developer  . Alcohol Use: No  . Drug Use: No  . Sexual Activity: Not on file   Other Topics Concern  . Not on file   Social History Narrative   The PMH, PSH, Social History, Family History, Medications, and allergies have been reviewed in [facility], and have been updated if relevant.   Review of  System     Constitutional:  Denies headache, fatigue, fever or abrupt weight changes.  HEENT:  Positive sore throat. Denies eye redness, eye pain, pressure behind the eyes, facial pain, nasal congestion, ear pain, ringing in the ears, wax buildup, runny nose or bloody nose. Respiratory: Positive cough. Denies difficulty breathing or shortness of breath.  Cardiovascular: Denies chest pain, chest tightness, palpitations or swelling in the hands or feet.   No other specific complaints in a complete review of systems (except as listed in HPI above).  Objective:   BP 140/78 mmHg  Pulse 65  Temp(Src) 99.3 F (37.4 C) (Oral)  Wt 227 lb 8 oz (103.193 kg)  SpO2 91% Wt Readings from Last 3 Encounters:  04/19/15 227 lb 8 oz (103.193 kg)  03/16/15 230 lb 8 oz (104.554 kg)  10/07/14 239 lb (108.41 kg)     General: Appears his stated age, obese but well developed, well  nourished in NAD. HEENT: Head: normal shape and size; Eyes: sclera white, no icterus, conjunctiva pink, PERRLA and EOMs intact; Ears: Tm's gray and intact, normal light reflex; Nose: mucosa pink and moist, septum midline; Throat/Mouth: + PND. Teeth present, mucosa erythematous and moist, no exudate noted, no lesions or ulcerations noted.  Neck: Mild cervical lymphadenopathy. Neck supple, trachea midline. No massses, lumps or thyromegaly present.  Cardiovascular: Normal rate and rhythm. S1,S2 noted.  Murmur noted. No rubs or gallops noted. No JVD or BLE edema. No carotid bruits noted. Pulmonary/Chest: Normal effort and scattered rhonchi, left > right  and bilateral faint expiratory wheeze noted.  Psych:   Good eye contact.  Not anxious or depressed appearing      Patient ID: Hayden Deleon, male   DOB: November 22, 1955, 60 y.o.   MRN: [id number]

## 2015-05-30 ENCOUNTER — Other Ambulatory Visit: Payer: Self-pay

## 2015-05-30 MED ORDER — PREDNISONE 10 MG PO TABS: ORAL_TABLET | ORAL | Status: DC

## 2015-05-30 NOTE — Telephone Encounter (Signed)
Noted.  eRx sent to pharmacy on file.  Please keep [place] updated.

## 2015-05-30 NOTE — Telephone Encounter (Signed)
Pt left v/m; pt was seen 04/19/15 and abx cleared up the head congestion but did not help with chest congestion. Pt was told if abx did not clear all symptoms would call in steroids; pt request steroids sent to [facility]. Pt request cb.

## 2015-06-13 NOTE — Telephone Encounter (Signed)
Pt left v/m; pt finished prednisone 06/09/15; prednisone worked well while taking med but now pt feels like lungs are getting worse again; if can be refilled pt request med to [facility]. Pt request cb.

## 2015-06-14 MED ORDER — PREDNISONE 10 MG PO TABS: ORAL_TABLET | ORAL | Status: DC

## 2015-07-11 DIAGNOSIS — H52223 Regular astigmatism, bilateral: Secondary | ICD-10-CM | POA: Diagnosis not present

## 2015-07-11 DIAGNOSIS — H0520 Unspecified exophthalmos: Secondary | ICD-10-CM | POA: Diagnosis not present

## 2015-07-11 DIAGNOSIS — H5203 Hypermetropia, bilateral: Secondary | ICD-10-CM | POA: Diagnosis not present

## 2015-07-13 DIAGNOSIS — M4806 Spinal stenosis, lumbar region: Secondary | ICD-10-CM | POA: Diagnosis not present

## 2015-07-13 DIAGNOSIS — M5416 Radiculopathy, lumbar region: Secondary | ICD-10-CM | POA: Diagnosis not present

## 2015-07-25 ENCOUNTER — Other Ambulatory Visit: Payer: Self-pay | Admitting: Orthopaedic Surgery

## 2015-07-25 DIAGNOSIS — M48061 Spinal stenosis, lumbar region without neurogenic claudication: Secondary | ICD-10-CM

## 2015-07-31 ENCOUNTER — Other Ambulatory Visit: Payer: Self-pay | Admitting: Orthopaedic Surgery

## 2015-07-31 ENCOUNTER — Ambulatory Visit
Admission: RE | Admit: 2015-07-31 | Discharge: 2015-07-31 | Disposition: A | Discharge status: Home / Self Care | Payer: BLUE CROSS/BLUE SHIELD | Source: Ambulatory Visit | Attending: Orthopaedic Surgery | Admitting: Orthopaedic Surgery

## 2015-07-31 DIAGNOSIS — M48061 Spinal stenosis, lumbar region without neurogenic claudication: Secondary | ICD-10-CM

## 2015-07-31 DIAGNOSIS — Z77018 Contact with and (suspected) exposure to other hazardous metals: Secondary | ICD-10-CM

## 2015-07-31 DIAGNOSIS — Z01818 Encounter for other preprocedural examination: Secondary | ICD-10-CM | POA: Diagnosis not present

## 2015-08-14 ENCOUNTER — Other Ambulatory Visit: Payer: Self-pay

## 2015-08-14 MED ORDER — SERTRALINE HCL 50 MG PO TABS: 50.0000 mg | ORAL_TABLET | Freq: Every day | ORAL | Status: DC

## 2015-08-14 NOTE — Telephone Encounter (Signed)
Pam with [facility] left v/m; [facility] received denial of [facility]raline to [facility] as pt not known to prescriber. Pt last seen 03/16/15;last refilled # 30 x 5 on 07/19/14. Sertraline given for anxiety state per 03/16/15 office note.Please advise.

## 2015-08-21 ENCOUNTER — Ambulatory Visit
Admission: RE | Admit: 2015-08-21 | Discharge: 2015-08-21 | Disposition: A | Discharge status: Home / Self Care | Payer: BLUE CROSS/BLUE SHIELD | Source: Ambulatory Visit | Attending: Orthopaedic Surgery | Admitting: Orthopaedic Surgery

## 2015-08-21 DIAGNOSIS — M4806 Spinal stenosis, lumbar region: Secondary | ICD-10-CM | POA: Diagnosis not present

## 2015-09-01 DIAGNOSIS — M5416 Radiculopathy, lumbar region: Secondary | ICD-10-CM | POA: Diagnosis not present

## 2015-09-01 DIAGNOSIS — M4806 Spinal stenosis, lumbar region: Secondary | ICD-10-CM | POA: Diagnosis not present

## 2015-09-27 DIAGNOSIS — M5416 Radiculopathy, lumbar region: Secondary | ICD-10-CM | POA: Diagnosis not present

## 2015-09-27 DIAGNOSIS — M47896 Other spondylosis, lumbar region: Secondary | ICD-10-CM | POA: Diagnosis not present

## 2015-10-13 ENCOUNTER — Other Ambulatory Visit: Payer: Self-pay | Admitting: *Deleted

## 2015-10-13 MED ORDER — METOPROLOL TARTRATE 50 MG PO TABS: ORAL_TABLET | ORAL | Status: DC

## 2015-10-18 DIAGNOSIS — M6281 Muscle weakness (generalized): Secondary | ICD-10-CM | POA: Diagnosis not present

## 2015-10-18 DIAGNOSIS — M545 Low back pain: Secondary | ICD-10-CM | POA: Diagnosis not present

## 2015-10-26 DIAGNOSIS — M4806 Spinal stenosis, lumbar region: Secondary | ICD-10-CM | POA: Diagnosis not present

## 2015-10-26 DIAGNOSIS — M5416 Radiculopathy, lumbar region: Secondary | ICD-10-CM | POA: Diagnosis not present

## 2015-11-01 DIAGNOSIS — M4806 Spinal stenosis, lumbar region: Secondary | ICD-10-CM | POA: Diagnosis not present

## 2015-11-02 ENCOUNTER — Telehealth: Payer: Self-pay

## 2015-11-02 NOTE — Telephone Encounter (Signed)
PLEASE NOTE: All timestamps contained within this report are represented as [place] Standard Time. CONFIDENTIALTY NOTICE: This fax transmission is intended only for the addressee. It contains information that is legally privileged, confidential or otherwise protected from use or disclosure. If you are not the intended recipient, you are strictly prohibited from reviewing, disclosing, copying using or disseminating any of this information or taking any action in reliance on or regarding this information. If you have received this fax in error, please notify us immediately by telephone so that we can arrange for its return to [place]. Phone: [phone], Toll-Free: [phone], Fax: [phone] Page: 1 of 1 Call Id: [id number] [facility] - Client Nonclinical Telephone Record [facility] - Client Client Site [place] - Day Physician Arnette Norris - MD Contact Type Call Who Is Calling Patient / Member / Family / Caregiver Caller Name Declined to provide Caller Phone Number Declined to provide Call Type Message Only Information Provided Reason for Call Request for General Office Information Initial Comment Caller states c/o intermittent chest pain. Caller declines triage and requests to make an appt only. Caller transferred. Additional Alwaleed Hodgen DOB [birth year] [phone] Call Closed By: Philis Kendall Transaction Date/Time: 11/02/2015 2:10:24 PM (ET)

## 2015-11-02 NOTE — Telephone Encounter (Signed)
I spoke with pt and he is not having pain now; pt had injection in back on 11/01/15 and thought that might have caused CP but pt spoke with doctor that did injection and was advised that would not cause CP. Pt had called office earlier to schedule appt with Dr Deborra Medina but Dr Deborra Medina not in office on 11/03/15 and pt could not schedule appt next week due to pts schedule. Pt said he only wants to see Dr Deborra Medina. Pt said he is not hurting now but if the pain in chest returns he will go to ED for eval.

## 2015-12-08 NOTE — Progress Notes (Deleted)
Cardiology Office Note   Date:  12/08/2015   ID:  Rayshaun, Alfieri 1956/03/22, MRN [id number]  PCP:  Arnette Norris, MD  Cardiologist:  Jenkins Rouge, MD   No chief complaint on file.     History of Present Illness: Hayden Deleon is a 60 y.o. male who presents today to follow-up coronary artery disease. Patient is new to me last seen by Dr Ron Parker in 2016  CAD cath in 2003 with occluded OM1 good collaterals  He had a nuclear stress study in March, 2014. There was no significant ischemia. He has good LV function. Previous dupex with 40-59% bilateral ICA stenosis 05/2014  . He has significant lung disease from previous smoking Sees Dr Lamonte Sakai . During the day sometimes at rest he feels the question of palpitations immediately followed by cough. This does not sound like a significant arrhythmia. Overall he's stable. He has not had syncope or presyncope.    Past Medical History:  Diagnosis Date  . CAD (coronary artery disease)    Catheterization, 2003, occluded OM1, good collaterals  //   nuclear, August, 2009, low risk, EF 60%  . Carotid artery disease ([place])    Doppler, February, 2012, [id number] R. ICA, [id number] LICA,  plan followup 1 year  . COPD (chronic obstructive pulmonary disease) (HCC)    Significant Dr. Lamonte Sakai  . Diverticulosis of colon   . Ejection fraction    EF 60%, nuclear, 2009  . GERD (gastroesophageal reflux disease)   . Hyperlipidemia   . Hypertension   . Prostatitis     Past Surgical History:  Procedure Laterality Date  . KNEE ARTHROSCOPY     right    Patient Active Problem List   Diagnosis Date Noted  . COPD (chronic obstructive pulmonary disease) ([place]) 10/07/2014  . OSA (obstructive sleep apnea) 05/01/2012  . CAD (coronary artery disease)   . Carotid artery disease ([place])   . GERD (gastroesophageal reflux disease)   . Ejection fraction   . MIXED HYPERLIPIDEMIA 05/03/2010  . BENIGN POSITIONAL VERTIGO 05/03/2010  . Anxiety state 11/10/2008  . ANGIOEDEMA  08/18/2007  . SYSTOLIC MURMUR 0000000  . DYSPHAGIA UNSPECIFIED 05/22/2007  . PROSTATITIS, HX OF 05/22/2007  . COPD exacerbation ([place]) 05/15/2007  . ADVEF, DRUG/MEDICINAL/BIOLOGICAL SUBST NOS 01/26/2007  . COLONIC POLYPS 12/31/2003  . GASTROESOPHAGEAL REFLUX DISEASE, CHRONIC 12/31/2003  . HIATAL HERNIA 12/31/2003  . DIVERTICULOSIS, COLON 12/31/2003  . HLD (hyperlipidemia) 05/02/2001  . HTN (hypertension) 05/02/2001      Current Outpatient Prescriptions  Medication Sig Dispense Refill  . albuterol (VENTOLIN HFA) 108 (90 BASE) MCG/ACT inhaler Inhale 2 puffs into the lungs every 6 (six) hours as needed for wheezing. 1 Inhaler 6  . aspirin 81 MG tablet Take 81 mg by mouth daily.      [name] atorvastatin (LIPITOR) 40 MG tablet Take 1 tablet (40 mg total) by mouth daily. 30 tablet 5  . azithromycin (ZITHROMAX) 250 MG tablet Take 2 tabs today, then 1 tab daily x 4 days 6 tablet 1  . cetirizine (ZYRTEC) 10 MG tablet Take 10 mg by mouth at bedtime.      . chlorpheniramine-HYDROcodone (TUSSIONEX PENNKINETIC ER) 10-8 MG/5ML SUER Take 5 mLs by mouth every 12 (twelve) hours as needed. 140 mL 0  . CLOMIPHENE CITRATE PO Take 30 mg by mouth daily.    . metoprolol (LOPRESSOR) 50 MG tablet TAKE 1 & 1/2 TABLETS BY MOUTH EACH MORNING AND 1 TABLET EACH EVENING 75 tablet 1  . Multiple  Vitamin (MULTIVITAMIN) capsule Take 1 capsule by mouth daily.      . predniSONE (DELTASONE) 10 MG tablet Take 2 tabs daily w/ food x 5 days, then 1 tab daily w/ food x 5 days and stop 15 tablet 0  . sertraline (ZOLOFT) 50 MG tablet Take 1 tablet (50 mg total) by mouth daily. 30 tablet 5  . SPIRIVA HANDIHALER 18 MCG inhalation capsule PLACE 1 CAPSULE INTO INHALER AND INHALE ONCE DAILY AS DIRECTED 30 capsule 11   No current facility-administered medications for this visit.     Allergies:   Avelox [moxifloxacin hcl in nacl]; Sulfamethoxazole-trimethoprim; and Aspirin    Social History:  The patient  reports that he quit smoking  about 16 years ago. His smoking use included Cigarettes. He has a 64.00 pack-year smoking history. He has quit using smokeless tobacco. He reports that he does not drink alcohol or use drugs.   Family History:  The patient's family history includes Angina in his mother; Asthma in his father; Crohn's disease in his sister.    ROS:  Please see the history of present illness. Patient denies fever, chills, headache, sweats, rash, change in vision, change in hearing, chest pain, nausea or vomiting, urinary symptoms. All other systems are reviewed and are negative.   PHYSICAL EXAM: VS:  There were no vitals taken for this visit. , The patient is oriented to person time and place. Affect is normal. He is overweight. Head is atraumatic. Sclera and conjunctiva are normal. There is no jugular venous distention. Lungs are clear. Respiratory effort is nonlabored. Cardiac exam reveals an S1 and S2. The abdomen is soft. There is no peripheral edema. There are no musculoskeletal deformities. There are no skin rashes. Neurologic is grossly intact.  EKG:   EKG is done today and reviewed by me. There is mild sinus bradycardia. There are no significant ST changes.  Recent Labs: 03/16/2015: ALT 18; BUN 11; Creatinine, Ser 1.09; Potassium 4.5; Sodium 144    Lipid Panel    Component Value Date/Time   CHOL 119 03/16/2015 0840   TRIG 101.0 03/16/2015 0840   HDL 41.60 03/16/2015 0840   CHOLHDL 3 03/16/2015 0840   VLDL 20.2 03/16/2015 0840   LDLCALC 57 03/16/2015 0840   LDLDIRECT 139.1 08/06/2007 0928      Wt Readings from Last 3 Encounters:  07/31/15 101.6 kg (224 lb)  04/19/15 103.2 kg (227 lb 8 oz)  03/16/15 104.6 kg (230 lb 8 oz)      Current medicines are reviewed  The patient understands his medications.     ASSESSMENT AND PLAN:  CAD: Distant cath with collateralized OM non ischemic myovue March 2014 Carotid f/u duplex known moderate bilateral ICA stenosis  Chol:  On statin  COPD:  F/u  [place] on inhalers stopped smoking

## 2015-12-11 ENCOUNTER — Ambulatory Visit: Payer: BLUE CROSS/BLUE SHIELD | Admitting: Cardiovascular Disease

## 2015-12-31 ENCOUNTER — Other Ambulatory Visit: Payer: Self-pay | Admitting: Family Medicine

## 2016-01-03 ENCOUNTER — Ambulatory Visit (INDEPENDENT_AMBULATORY_CARE_PROVIDER_SITE_OTHER): Payer: BLUE CROSS/BLUE SHIELD | Admitting: Family Medicine

## 2016-01-03 ENCOUNTER — Encounter: Payer: Self-pay | Admitting: Family Medicine

## 2016-01-03 VITALS — BP 136/70 | HR 58 | Temp 98.3°F | Wt 221.0 lb

## 2016-01-03 DIAGNOSIS — Z125 Encounter for screening for malignant neoplasm of prostate: Secondary | ICD-10-CM | POA: Diagnosis not present

## 2016-01-03 DIAGNOSIS — R42 Dizziness and giddiness: Secondary | ICD-10-CM | POA: Insufficient documentation

## 2016-01-03 DIAGNOSIS — R7989 Other specified abnormal findings of blood chemistry: Secondary | ICD-10-CM

## 2016-01-03 DIAGNOSIS — I2510 Atherosclerotic heart disease of native coronary artery without angina pectoris: Secondary | ICD-10-CM

## 2016-01-03 DIAGNOSIS — Z1211 Encounter for screening for malignant neoplasm of colon: Secondary | ICD-10-CM

## 2016-01-03 DIAGNOSIS — E782 Mixed hyperlipidemia: Secondary | ICD-10-CM

## 2016-01-03 LAB — CBC WITH DIFFERENTIAL/PLATELET
BASOS PCT: 0.4 % (ref 0.0–3.0)
Basophils Absolute: 0 10*3/uL (ref 0.0–0.1)
EOS ABS: 0.3 10*3/uL (ref 0.0–0.7)
Eosinophils Relative: 3.5 % (ref 0.0–5.0)
HEMATOCRIT: 45.7 % (ref 39.0–52.0)
Hemoglobin: 15.9 g/dL (ref 13.0–17.0)
LYMPHS ABS: 2.2 10*3/uL (ref 0.7–4.0)
LYMPHS PCT: 24.1 % (ref 12.0–46.0)
MCHC: 34.7 g/dL (ref 30.0–36.0)
MCV: 93 fl (ref 78.0–100.0)
MONOS PCT: 9.9 % (ref 3.0–12.0)
Monocytes Absolute: 0.9 10*3/uL (ref 0.1–1.0)
NEUTROS ABS: 5.6 10*3/uL (ref 1.4–7.7)
Neutrophils Relative %: 62.1 % (ref 43.0–77.0)
PLATELETS: 199 10*3/uL (ref 150.0–400.0)
RBC: 4.91 Mil/uL (ref 4.22–5.81)
RDW: 13.4 % (ref 11.5–15.5)
WBC: 9 10*3/uL (ref 4.0–10.5)

## 2016-01-03 LAB — LIPID PANEL
Cholesterol: 137 mg/dL (ref 0–200)
HDL: 45 mg/dL (ref >39.00)
NONHDL: 92.22
Total CHOL/HDL Ratio: 3
Triglycerides: 204 mg/dL — ABNORMAL HIGH (ref 0.0–149.0)
VLDL: 40.8 mg/dL — ABNORMAL HIGH (ref 0.0–40.0)

## 2016-01-03 LAB — COMPREHENSIVE METABOLIC PANEL
ALT: 12 U/L (ref 0–53)
AST: 14 U/L (ref 0–37)
Albumin: 4 g/dL (ref 3.5–5.2)
Alkaline Phosphatase: 56 U/L (ref 39–117)
BILIRUBIN TOTAL: 0.6 mg/dL (ref 0.2–1.2)
BUN: 11 mg/dL (ref 6–23)
CO2: 32 mEq/L (ref 19–32)
CREATININE: 0.96 mg/dL (ref 0.40–1.50)
Calcium: 9.3 mg/dL (ref 8.4–10.5)
Chloride: 103 mEq/L (ref 96–112)
GFR: 84.9 mL/min (ref >60.00)
GLUCOSE: 78 mg/dL (ref 70–99)
Potassium: 4.5 mEq/L (ref 3.5–5.1)
Sodium: 142 mEq/L (ref 135–145)
Total Protein: 6.6 g/dL (ref 6.0–8.3)

## 2016-01-03 LAB — LDL CHOLESTEROL, DIRECT: Direct LDL: 71 mg/dL

## 2016-01-03 LAB — TSH: TSH: 1.54 u[IU]/mL (ref 0.35–4.50)

## 2016-01-03 LAB — PSA: PSA: 1.27 ng/mL (ref 0.10–4.00)

## 2016-01-03 NOTE — Patient Instructions (Signed)
Great to see you. Our referrals coordinator will call you.

## 2016-01-03 NOTE — Assessment & Plan Note (Signed)
Check lipid panel today 

## 2016-01-03 NOTE — Progress Notes (Signed)
Subjective:   Patient ID: Hayden Deleon, male    DOB: 08-01-1955, 60 y.o.   MRN: [id number]  Nasri Casucci is a pleasant 60 y.o. year old male who presents to clinic today with Dizziness  on 01/03/2016  HPI:  Intermittent dizziness for months.  Does not seem positional.  Seems worse when he has been starting at the computer screen.  His cardiologist has retired, would like a referral to another cardiologist.  No CP.  Has persistent SOB with his COPD.  This is better since he restarted spiriva.   Current Outpatient Prescriptions on File Prior to Visit  Medication Sig Dispense Refill  . albuterol (VENTOLIN HFA) 108 (90 BASE) MCG/ACT inhaler Inhale 2 puffs into the lungs every 6 (six) hours as needed for wheezing. 1 Inhaler 6  . aspirin 81 MG tablet Take 81 mg by mouth daily.      [name] atorvastatin (LIPITOR) 40 MG tablet Take 1 tablet (40 mg total) by mouth daily. 30 tablet 5  . cetirizine (ZYRTEC) 10 MG tablet Take 10 mg by mouth at bedtime.      [name] CLOMIPHENE CITRATE PO Take 30 mg by mouth daily.    . metoprolol (LOPRESSOR) 50 MG tablet TAKE 1 & 1/2 TABLETS BY MOUTH EACH MORNING AND 1 TABLET EACH EVENING 75 tablet 1  . Multiple Vitamin (MULTIVITAMIN) capsule Take 1 capsule by mouth daily.      . sertraline (ZOLOFT) 50 MG tablet Take 1 tablet (50 mg total) by mouth daily. 30 tablet 5  . SPIRIVA HANDIHALER 18 MCG inhalation capsule PLACE 1 CAPSULE INTO INHALER AND INHALE ONCE DAILY AS DIRECTED 90 capsule 1   No current facility-administered medications on file prior to visit.     Allergies  Allergen Reactions  . Avelox [Moxifloxacin Hcl In Nacl] Other (See Comments)    Facial swelling  . Sulfamethoxazole-Trimethoprim Other (See Comments)    REACTION: sore mouth  . Aspirin Other (See Comments)    REACTION: Nausea; fine with 81mg     Past Medical History:  Diagnosis Date  . CAD (coronary artery disease)    Catheterization, 2003, occluded OM1, good collaterals  //    nuclear, August, 2009, low risk, EF 60%  . Carotid artery disease ([place])    Doppler, February, 2012, [id number] R. ICA, [id number] LICA,  plan followup 1 year  . COPD (chronic obstructive pulmonary disease) ([facility])    Significant Dr. Lamonte Sakai  . Diverticulosis of colon   . Ejection fraction    EF 60%, nuclear, 2009  . GERD (gastroesophageal reflux disease)   . Hyperlipidemia   . Hypertension   . Prostatitis     Past Surgical History:  Procedure Laterality Date  . KNEE ARTHROSCOPY     right    Family History  Problem Relation Age of Onset  . Angina Mother     car accident  . Asthma Father   . Crohn's disease Sister     Social History   Social History  . Marital status: Married    Spouse name: N/A  . Number of children: 0  . Years of education: N/A   Occupational History  . 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 Smoking status: Former Smoker    Packs/day: 2.00    Years: 32.00    Types: Cigarettes    Quit date: 08/31/1999  . Smokeless tobacco: Former Systems developer  . Alcohol use No  . Drug use: No  .  Sexual activity: Not on file   Other Topics Concern  . Not on file   Social History Narrative  . No narrative on file   The PMH, PSH, Social History, Family History, Medications, and allergies have been reviewed in [facility], and have been updated if relevant.   Review of Systems  Constitutional: Negative for fatigue and fever.  Respiratory: Negative.   Cardiovascular: Negative.   Gastrointestinal: Negative.   Genitourinary: Negative.   Musculoskeletal: Negative.   Neurological: Positive for dizziness. Negative for tremors, seizures, syncope, facial asymmetry, speech difficulty, weakness, light-headedness, numbness and headaches.  Hematological: Negative.   Psychiatric/Behavioral: Negative.   All other systems reviewed and are negative.      Objective:    BP 136/70   Pulse (!) 58   Temp 98.3 F (36.8 C) (Oral)   Wt 221 lb (100.2 kg)    SpO2 94%   BMI 31.71 kg/m    Physical Exam  Constitutional: He is oriented to person, place, and time. He appears well-developed and well-nourished. No distress.  HENT:  Head: Normocephalic.  Eyes: Conjunctivae are normal.  Cardiovascular: Normal rate and regular rhythm.   Pulmonary/Chest: Effort normal and breath sounds normal. No respiratory distress. He has no wheezes.  Musculoskeletal: Normal range of motion. He exhibits no edema.  Neurological: He is alert and oriented to person, place, and time. No cranial nerve deficit.  Skin: Skin is warm and dry. He is not diaphoretic.  Psychiatric: He has a normal mood and affect. His behavior is normal. Judgment and thought content normal.  Nursing note and vitals reviewed.         Assessment & Plan:   Coronary artery disease involving native coronary artery of native heart without angina pectoris - Plan: Ambulatory referral to Cardiology, Lipid panel, Comprehensive metabolic panel  Dizziness and giddiness - Plan: CBC with Differential/Platelet, TSH  Special screening for malignant neoplasms, colon - Plan: Ambulatory referral to Gastroenterology  Screening for malignant neoplasm of prostate - Plan: PSA No Follow-up on file.

## 2016-01-03 NOTE — Progress Notes (Signed)
Pre visit review using our clinic review tool, if applicable. No additional management support is needed unless otherwise documented below in the visit note. 

## 2016-01-03 NOTE — Assessment & Plan Note (Signed)
Refer to cardiology 

## 2016-01-03 NOTE — Assessment & Plan Note (Signed)
Exam reassuring. Overdue for eye exam. Advised eye exam, labs today. Call or return to clinic prn if these symptoms worsen or fail to improve as anticipated. The patient indicates understanding of these issues and agrees with the plan.

## 2016-01-04 ENCOUNTER — Encounter: Payer: Self-pay | Admitting: Gastroenterology

## 2016-01-12 ENCOUNTER — Ambulatory Visit: Payer: BLUE CROSS/BLUE SHIELD | Admitting: *Deleted

## 2016-01-12 VITALS — Ht 70.0 in | Wt 221.2 lb

## 2016-01-12 DIAGNOSIS — Z86010 Personal history of colonic polyps: Secondary | ICD-10-CM

## 2016-01-12 MED ORDER — SUPREP BOWEL PREP KIT 17.5-3.13-1.6 GM/177ML PO SOLN: 1.0000 | Freq: Once | ORAL | 0 refills | Status: AC

## 2016-01-12 NOTE — Progress Notes (Signed)
Patient denies any allergies to egg or soy products. Patient denies complications with anesthesia/sedation.  Patient denies oxygen use at home and denies diet medications. Emmi instructions for colonoscopy explained but patient denied.   Patient informed that he will need cardiac clearance prior to 10/30 colonoscopy procedure.  Patient has appt. With Dr Yvone Neu on 01/17/16.  Patient was given phone [phone] to call back with results after appointment with cardiologist.  Patient verbalized understanding and will call us with results.

## 2016-01-15 ENCOUNTER — Encounter: Payer: Self-pay | Admitting: Gastroenterology

## 2016-01-17 ENCOUNTER — Ambulatory Visit (INDEPENDENT_AMBULATORY_CARE_PROVIDER_SITE_OTHER): Payer: BLUE CROSS/BLUE SHIELD | Admitting: Cardiology

## 2016-01-17 ENCOUNTER — Encounter: Payer: Self-pay | Admitting: Cardiology

## 2016-01-17 VITALS — BP 130/62 | HR 58 | Ht 70.0 in | Wt 219.8 lb

## 2016-01-17 DIAGNOSIS — E785 Hyperlipidemia, unspecified: Secondary | ICD-10-CM

## 2016-01-17 DIAGNOSIS — I2510 Atherosclerotic heart disease of native coronary artery without angina pectoris: Secondary | ICD-10-CM | POA: Diagnosis not present

## 2016-01-17 DIAGNOSIS — R001 Bradycardia, unspecified: Secondary | ICD-10-CM | POA: Diagnosis not present

## 2016-01-17 DIAGNOSIS — I10 Essential (primary) hypertension: Secondary | ICD-10-CM

## 2016-01-17 DIAGNOSIS — I779 Disorder of arteries and arterioles, unspecified: Secondary | ICD-10-CM | POA: Diagnosis not present

## 2016-01-17 DIAGNOSIS — I739 Peripheral vascular disease, unspecified: Principal | ICD-10-CM

## 2016-01-17 MED ORDER — METOPROLOL TARTRATE 50 MG PO TABS: 50.0000 mg | ORAL_TABLET | Freq: Two times a day (BID) | ORAL | 6 refills | Status: DC

## 2016-01-17 NOTE — Patient Instructions (Signed)
Medication Instructions:  Your physician has recommended you make the following change in your medication:  1. START Metoprolol 50 mg twice daily   Testing/Procedures: Your physician has requested that you have an echocardiogram. Echocardiography is a painless test that uses sound waves to create images of your heart. It provides your doctor with information about the size and shape of your heart and how well your heart's chambers and valves are working. This procedure takes approximately one hour. There are no restrictions for this procedure.  Your physician has requested that you have a carotid duplex. This test is an ultrasound of the carotid arteries in your neck. It looks at blood flow through these arteries that supply the brain with blood. Allow one hour for this exam. There are no restrictions or special instructions.  [place]  Your caregiver has ordered a Stress Test with nuclear imaging. The purpose of this test is to evaluate the blood supply to your heart muscle. This procedure is referred to as a "Non-Invasive Stress Test." This is because other than having an IV started in your vein, nothing is inserted or "invades" your body. Cardiac stress tests are done to find areas of poor blood flow to the heart by determining the extent of coronary artery disease (CAD). Some patients exercise on a treadmill, which naturally increases the blood flow to your heart, while others who are  unable to walk on a treadmill due to physical limitations have a pharmacologic/chemical stress agent called Lexiscan . This medicine will mimic walking on a treadmill by temporarily increasing your coronary blood flow.   Please note: these test may take anywhere between 2-4 hours to complete  PLEASE REPORT TO [facility] AT THE FIRST DESK WILL DIRECT YOU WHERE TO GO  Date of Procedure:_Thursday January 25, 2016 at 07:30AM___  Arrival Time for Procedure:__Arrive at 07:15AM to  register___  Instructions regarding medication:   __X__:  Hold metoprolol the night before procedure and morning of procedure    PLEASE NOTIFY THE OFFICE AT LEAST 24 HOURS IN ADVANCE IF YOU ARE UNABLE TO KEEP YOUR APPOINTMENT.  [phone] AND  PLEASE NOTIFY NUCLEAR MEDICINE AT [facility] AT LEAST 24 HOURS IN ADVANCE IF YOU ARE UNABLE TO KEEP YOUR APPOINTMENT. [phone]  How to prepare for your Myoview test:  1. Do not eat or drink after midnight 2. No caffeine for 24 hours prior to test 3. No smoking 24 hours prior to test. 4. Your medication may be taken with water.  If your doctor stopped a medication because of this test, do not take that medication. 5. Ladies, please do not wear dresses.  Skirts or pants are appropriate. Please wear a short sleeve shirt. 6. No perfume, cologne or lotion. 7. Wear comfortable walking shoes. No heels!      Follow-Up: Your physician wants you to follow-up in: 6 months with Dr. Yvone Neu. You will receive a reminder letter in the mail two months in advance. If you don't receive a letter, please call our office to schedule the follow-up appointment.  We will call you with results and if needed schedule appointment sooner.   It was a pleasure seeing you today here in the office. Please do not hesitate to give [place] a call back if you have any further questions. [place], BSN    Pharmacologic Stress Electrocardiogram A pharmacologic stress electrocardiogram is a heart (cardiac) test that uses nuclear imaging to evaluate the blood supply to your  heart. This test may also be called a pharmacologic stress electrocardiography. Pharmacologic means that a medicine is used to increase your heart rate and blood pressure.  This stress test is done to find areas of poor blood flow to the heart by determining the extent of coronary artery disease (CAD). Some people exercise on a treadmill, which naturally increases the blood flow to the heart. For  those people unable to exercise on a treadmill, a medicine is used. This medicine stimulates your heart and will cause your heart to beat harder and more quickly, as if you were exercising.  Pharmacologic stress tests can help determine:  The adequacy of blood flow to your heart during increased levels of activity in order to clear you for discharge home.  The extent of coronary artery blockage caused by CAD.  Your prognosis if you have suffered a heart attack.  The effectiveness of cardiac procedures done, such as an angioplasty, which can increase the circulation in your coronary arteries.  Causes of chest pain or pressure. LET [facility] CARE PROVIDER KNOW ABOUT:  Any allergies you have.  All medicines you are taking, including vitamins, herbs, eye drops, creams, and over-the-counter medicines.  Previous problems you or members of your family have had with the use of anesthetics.  Any blood disorders you have.  Previous surgeries you have had.  Medical conditions you have.  Possibility of pregnancy, if this applies.  If you are currently breastfeeding. RISKS AND COMPLICATIONS Generally, this is a safe procedure. However, as with any procedure, complications can occur. Possible complications include:  You develop pain or pressure in the following areas:  Chest.  Jaw or neck.  Between your shoulder blades.  Radiating down your left arm.  Headache.  Dizziness or light-headedness.  Shortness of breath.  Increased or irregular heartbeat.  Low blood pressure.  Nausea or vomiting.  Flushing.  Redness going up the arm and slight pain during injection of medicine.  Heart attack (rare). BEFORE THE PROCEDURE   Avoid all forms of caffeine for 24 hours before your test or as directed by your health care provider. This includes coffee, tea (even decaffeinated tea), caffeinated sodas, chocolate, cocoa, and certain pain medicines.  Follow your health care provider's  instructions regarding eating and drinking before the test.  Take your medicines as directed at regular times with water unless instructed otherwise. Exceptions may include:  If you have diabetes, ask how you are to take your insulin or pills. It is common to adjust insulin dosing the morning of the test.  If you are taking beta-blocker medicines, it is important to talk to your health care provider about these medicines well before the date of your test. Taking beta-blocker medicines may interfere with the test. In some cases, these medicines need to be changed or stopped 24 hours or more before the test.  If you wear a nitroglycerin patch, it may need to be removed prior to the test. Ask your health care provider if the patch should be removed before the test.  If you use an inhaler for any breathing condition, bring it with you to the test.  If you are an outpatient, bring a snack so you can eat right after the stress phase of the test.  Do not smoke for 4 hours prior to the test or as directed by your health care provider.  Do not apply lotions, powders, creams, or oils on your chest prior to the test.  Wear comfortable shoes and clothing.  Let your health care provider know if you were unable to complete or follow the preparations for your test. PROCEDURE   Multiple patches (electrodes) will be put on your chest. If needed, small areas of your chest may be shaved to get better contact with the electrodes. Once the electrodes are attached to your body, multiple wires will be attached to the electrodes, and your heart rate will be monitored.  An IV access will be started. A nuclear trace (isotope) is given. The isotope may be given intravenously, or it may be swallowed. Nuclear refers to several types of radioactive isotopes, and the nuclear isotope lights up the arteries so that the nuclear images are clear. The isotope is absorbed by your body. This results in low radiation exposure.  A  resting nuclear image is taken to show how your heart functions at rest.  A medicine is given through the IV access.  A second scan is done about 1 hour after the medicine injection and determines how your heart functions under stress.  During this stress phase, you will be connected to an electrocardiogram machine. Your blood pressure and oxygen levels will be monitored. AFTER THE PROCEDURE   Your heart rate and blood pressure will be monitored after the test.  You may return to your normal schedule, including diet,activities, and medicines, unless your health care provider tells you otherwise.   This information is not intended to replace advice given to you by your health care provider. Make sure you discuss any questions you have with your health care provider.   Document Released: 08/04/2008 Document Revised: 03/23/2013 Document Reviewed: 11/23/2012 Elsevier Interactive Patient Education 2016 [employer]. Echocardiogram An echocardiogram, or echocardiography, uses sound waves (ultrasound) to produce an image of your heart. The echocardiogram is simple, painless, obtained within a short period of time, and offers valuable information to your health care provider. The images from an echocardiogram can provide information such as:  Evidence of coronary artery disease (CAD).  Heart size.  Heart muscle function.  Heart valve function.  Aneurysm detection.  Evidence of a past heart attack.  Fluid buildup around the heart.  Heart muscle thickening.  Assess heart valve function. LET [facility] CARE PROVIDER KNOW ABOUT:  Any allergies you have.  All medicines you are taking, including vitamins, herbs, eye drops, creams, and over-the-counter medicines.  Previous problems you or members of your family have had with the use of anesthetics.  Any blood disorders you have.  Previous surgeries you have had.  Medical conditions you have.  Possibility of pregnancy, if this  applies. BEFORE THE PROCEDURE  No special preparation is needed. Eat and drink normally.  PROCEDURE   In order to produce an image of your heart, gel will be applied to your chest and a wand-like tool (transducer) will be moved over your chest. The gel will help transmit the sound waves from the transducer. The sound waves will harmlessly bounce off your heart to allow the heart images to be captured in real-time motion. These images will then be recorded.  You may need an IV to receive a medicine that improves the quality of the pictures. AFTER THE PROCEDURE You may return to your normal schedule including diet, activities, and medicines, unless your health care provider tells you otherwise.   This information is not intended to replace advice given to you by your health care provider. Make sure you discuss any questions you have with your health care provider.   Document Released: 03/15/2000  Document Revised: 04/08/2014 Document Reviewed: 11/23/2012 Elsevier Interactive Patient Education [employer].

## 2016-01-17 NOTE — Progress Notes (Signed)
Cardiology Office Note   Date:  01/18/2016   ID:  [name], DOB 30-Jun-1955, MRN [id number]  Referring Doctor:  Arnette Norris, MD   Cardiologist:   Wende Bushy, MD   Reason for consultation:  Chief Complaint  Patient presents with  . Bilateral carotid artery disease ([place])  . Coronary artery disease involving native coronary artery of       History of Present Illness: Hayden Deleon is a 60 y.o. male who presents for Establishing cardiac care. Patient has a history of CAD.  Review of medical records reveal:  Catheterization in 2003 revealed occluded OM1 with good collaterals. Nuclear stress study in 2009 was low risk. Nuclear stress study in March, 2014 revealed evidence of a small scar with mild. Infarct ischemia. The EF was 63%. The study was low risk.  Since his last visit with Dr.Katz, patient feels that he has remained stable. He has a chronic history of mild chest pain that usually occurs with exertion right after a meal. Otherwise, he does not develop any chest pain with any level of exertion. Chest pain as an center of the chest, moderate in intensity, lasting minutes at a time, nonradiating.  Patient has chronic shortness of breath which he believes is his COPD it has not progressed recently.  Patient denies palpitations, lightheadedness, syncope, PND, orthopnea, edema.  ROS:  Please see the history of present illness. Aside from mentioned under HPI, all other systems are reviewed and negative.     Past Medical History:  Diagnosis Date  . Allergy   . Anxiety   . Arthritis    right shoulder  . CAD (coronary artery disease)    Catheterization, 2003, occluded OM1, good collaterals  //   nuclear, August, 2009, low risk, EF 60%  . Carotid artery disease ([place])    Doppler, February, 2012, [id number] R. ICA, [id number] LICA,  plan followup 1 year  . COPD (chronic obstructive pulmonary disease) ([facility])    Significant Dr. Lamonte Sakai  . Diverticulosis of colon   . Ejection  fraction    EF 60%, nuclear, 2009  . GERD (gastroesophageal reflux disease)   . Hyperlipidemia   . Hypertension   . Prostatitis   . Sleep apnea    does not use CPAP    Past Surgical History:  Procedure Laterality Date  . COLONOSCOPY  12/2003   [facility] - polyp  . deviated septum    . KNEE ARTHROSCOPY     right  . WISDOM TOOTH EXTRACTION       reports that he quit smoking about 16 years ago. His smoking use included Cigarettes. He has a 64.00 pack-year smoking history. He has quit using smokeless tobacco. He reports that he does not drink alcohol or use drugs.   family history includes Angina in his mother; Asthma in his father; Crohn's disease in his sister.   Outpatient Medications Prior to Visit  Medication Sig Dispense Refill  . albuterol (VENTOLIN HFA) 108 (90 BASE) MCG/ACT inhaler Inhale 2 puffs into the lungs every 6 (six) hours as needed for wheezing. 1 Inhaler 6  . aspirin 81 MG tablet Take 81 mg by mouth daily.      [name] atorvastatin (LIPITOR) 40 MG tablet Take 1 tablet (40 mg total) by mouth daily. 30 tablet 5  . cetirizine (ZYRTEC) 10 MG tablet Take 10 mg by mouth at bedtime.      . Multiple Vitamin (MULTIVITAMIN) capsule Take 1 capsule by mouth daily.      [name]  sertraline (ZOLOFT) 50 MG tablet Take 1 tablet (50 mg total) by mouth daily. 30 tablet 5  . SPIRIVA HANDIHALER 18 MCG inhalation capsule PLACE 1 CAPSULE INTO INHALER AND INHALE ONCE DAILY AS DIRECTED 90 capsule 1  . metoprolol (LOPRESSOR) 50 MG tablet TAKE 1 & 1/2 TABLETS BY MOUTH EACH MORNING AND 1 TABLET EACH EVENING 75 tablet 1   No facility-administered medications prior to visit.      Allergies: Avelox [moxifloxacin hcl in nacl]; Sulfamethoxazole-trimethoprim; and Aspirin    PHYSICAL EXAM: VS:  BP 130/62 (BP Location: Right Arm, Patient Position: Sitting, Cuff Size: Normal)   Pulse (!) 58   Ht 5\' 10"  (1.778 m)   Wt 219 lb 12.8 oz (99.7 kg)   SpO2 90%   BMI 31.54 kg/m  , Body mass index is 31.54  kg/m. Wt Readings from Last 3 Encounters:  01/17/16 219 lb 12.8 oz (99.7 kg)  01/12/16 221 lb 3.2 oz (100.3 kg)  01/03/16 221 lb (100.2 kg)    GENERAL:  well developed, well nourished, obese, not in acute distress HEENT: normocephalic, pink conjunctivae, anicteric sclerae, no xanthelasma, normal dentition, oropharynx clear NECK:  no neck vein engorgement, JVP normal, no hepatojugular reflux, carotid upstroke brisk and symmetric, no bruit, no thyromegaly, no lymphadenopathy LUNGS:  good respiratory effort, clear to auscultation bilaterally CV:  PMI not displaced, no thrills, no lifts, S1 and S2 within normal limits, no palpable S3 or S4, no murmurs, no rubs, no gallops ABD:  Soft, nontender, nondistended, normoactive bowel sounds, no abdominal aortic bruit, no hepatomegaly, no splenomegaly MS: nontender back, no kyphosis, no scoliosis, no joint deformities EXT:  2+ DP/PT pulses, no edema, no varicosities, no cyanosis, no clubbing SKIN: warm, nondiaphoretic, normal turgor, no ulcers NEUROPSYCH: alert, oriented to person, place, and time, sensory/motor grossly intact, normal mood, appropriate affect  Recent Labs: 01/03/2016: ALT 12; BUN 11; Creatinine, Ser 0.96; Hemoglobin 15.9; Platelets 199.0; Potassium 4.5; Sodium 142; TSH 1.54   Lipid Panel    Component Value Date/Time   CHOL 137 01/03/2016 1201   TRIG 204.0 (H) 01/03/2016 1201   HDL 45.00 01/03/2016 1201   CHOLHDL 3 01/03/2016 1201   VLDL 40.8 (H) 01/03/2016 1201   LDLCALC 57 03/16/2015 0840   LDLDIRECT 71.0 01/03/2016 1201     Other studies Reviewed:  EKG:  The ekg from 01/17/2016 was personally reviewed by me and it revealed sinus rhythm, 50 BPM. Rightward axis.  Additional studies/ records that were reviewed personally reviewed by me today include: None available   ASSESSMENT AND PLAN:  CAD Catheterization in 2003 revealed occluded OM1 with good collaterals. This reports of chest pain with exertion in the postprandial  setting has been a ongoing issue. He does not feel that it has progressed recently. However it is persistent. Recommend further evaluation with stress testing. Recommend echocardiogram. Continue medical therapy with aspirin 81 mg by mouth daily, statin therapy, metoprolol.  Sinus bradycardia He reports blood pressure sometimes in the 40s at home with symptoms of fatigue. Recommend trial of reducing metoprolol to 50 mg twice a day.  Hypertension BP is well controlled. Continue monitoring BP. Continue current medical therapy and lifestyle changes.  Carotid artery disease He is due for carotid ultrasound.  Hyperlipidemia PCP following labs. LDL goal is less than 70.  If his stress testing is negative for ischemia, his risk for planned colonoscopy would likely be intermediate cardiac risk.  Current medicines are reviewed at length with the patient today.  The patient does  not have concerns regarding medicines.  Labs/ tests ordered today include:  Orders Placed This Encounter  Procedures  . NM Myocar Multi W/Spect W/Wall Motion / EF  . EKG 12-Lead  . ECHOCARDIOGRAM COMPLETE    I had a lengthy and detailed discussion with the patient regarding diagnoses, prognosis, diagnostic options, treatment options , and side effects of medications.   I counseled the patient on importance of lifestyle modification including heart healthy diet, regular physical activity    Disposition:   FU with undersigned In 6 months   Signed, Wende Bushy, MD  01/18/2016 9:49 AM    [place]  This note was generated in part with voice recognition software and I apologize for any typographical errors that were not detected and corrected.

## 2016-01-24 ENCOUNTER — Telehealth: Payer: Self-pay | Admitting: Cardiology

## 2016-01-24 NOTE — Telephone Encounter (Signed)
Left detailed message with instructions, time, and location for stress test scheduled tomorrow.

## 2016-01-25 ENCOUNTER — Encounter
Admission: RE | Admit: 2016-01-25 | Discharge: 2016-01-25 | Disposition: A | Discharge status: Home / Self Care | Payer: BLUE CROSS/BLUE SHIELD | Source: Ambulatory Visit | Attending: Cardiology | Admitting: Cardiology

## 2016-01-25 DIAGNOSIS — I2510 Atherosclerotic heart disease of native coronary artery without angina pectoris: Secondary | ICD-10-CM | POA: Diagnosis not present

## 2016-01-25 DIAGNOSIS — I779 Disorder of arteries and arterioles, unspecified: Secondary | ICD-10-CM

## 2016-01-25 DIAGNOSIS — I739 Peripheral vascular disease, unspecified: Secondary | ICD-10-CM

## 2016-01-25 LAB — NM MYOCAR MULTI W/SPECT W/WALL MOTION / EF
CHL CUP NUCLEAR SSS: 3
CHL CUP RESTING HR STRESS: 59 {beats}/min
LV sys vol: 36 mL
LVDIAVOL: 75 mL (ref 62–150)
NUC STRESS TID: 1.22
Peak HR: 81 {beats}/min
Percent HR: 50 %
SDS: 3
SRS: 2

## 2016-01-25 MED ORDER — TECHNETIUM TC 99M TETROFOSMIN IV KIT
12.6020 | PACK | Freq: Once | INTRAVENOUS | Status: AC | PRN
  Administered 2016-01-25: 12.602 via INTRAVENOUS

## 2016-01-25 MED ORDER — REGADENOSON 0.4 MG/5ML IV SOLN
0.4000 mg | Freq: Once | INTRAVENOUS | Status: AC
  Administered 2016-01-25: 0.4 mg via INTRAVENOUS

## 2016-01-25 MED ORDER — TECHNETIUM TC 99M TETROFOSMIN IV KIT
30.0900 | PACK | Freq: Once | INTRAVENOUS | Status: AC | PRN
  Administered 2016-01-25: 30.09 via INTRAVENOUS

## 2016-01-26 ENCOUNTER — Telehealth: Payer: Self-pay | Admitting: Gastroenterology

## 2016-01-26 NOTE — Telephone Encounter (Signed)
Patient has seen his cardiologist. Her note says "If his stress testing is negative for ischemia, his risk for planned colonoscopy would likely be intermediate cardiac risk."  He had his stress test yesterday, but it is not interpreted yet. I will call her office if you would like. Please advise

## 2016-01-26 NOTE — Telephone Encounter (Signed)
No call or note from cardiology. I have moved his procedure to 01/31/16. Patient is aware of the change.

## 2016-01-26 NOTE — Telephone Encounter (Signed)
Call to cardiology. Stress test has not been interpreted yet. Dr Yvone Neu is out of the office until Tuesday of next week. Staff message to Dr Rockey Situ requesting clearance.

## 2016-01-26 NOTE — Telephone Encounter (Signed)
Ok thanks 

## 2016-01-26 NOTE — Telephone Encounter (Signed)
No ischemia on stress test, please verify with Cardiology office if ok to proceed with colonoscopy.

## 2016-01-26 NOTE — Telephone Encounter (Signed)
[phone] 

## 2016-01-29 ENCOUNTER — Encounter: Payer: BLUE CROSS/BLUE SHIELD | Admitting: Gastroenterology

## 2016-01-30 NOTE — Telephone Encounter (Signed)
I have sent a message to Dr Yvone Neu. Dr Rockey Situ responded he had forwarded my message to the correct cariologist.

## 2016-01-30 NOTE — Telephone Encounter (Signed)
Information forwarded to Dr Silverio Decamp.

## 2016-01-30 NOTE — Telephone Encounter (Signed)
-----   Message from Wende Bushy, MD sent at 01/30/2016  9:27 AM EDT -----  His stress testing 01/25/2016 is negative for ischemia, his risk for planned colonoscopy is intermediate cardiac risk. No indication for further cardiac testing at this point.   ----- Message ----- From: Greggory Keen, LPN Sent: [id number]   8:54 AM To: Wende Bushy, MD  Good morning,  Hayden Deleon's colonoscopy was moved to 04/02/15. Is he cleared for this procedure in your opinion?  Thank you  Virgina Evener, LPN for Dr Harl Bowie, MD  GI

## 2016-01-31 ENCOUNTER — Ambulatory Visit (AMBULATORY_SURGERY_CENTER): Payer: BLUE CROSS/BLUE SHIELD | Admitting: Gastroenterology

## 2016-01-31 ENCOUNTER — Encounter: Payer: Self-pay | Admitting: Gastroenterology

## 2016-01-31 VITALS — BP 117/72 | HR 59 | Temp 97.5°F | Resp 12 | Ht 70.0 in | Wt 221.0 lb

## 2016-01-31 DIAGNOSIS — D122 Benign neoplasm of ascending colon: Secondary | ICD-10-CM | POA: Diagnosis not present

## 2016-01-31 DIAGNOSIS — D123 Benign neoplasm of transverse colon: Secondary | ICD-10-CM

## 2016-01-31 DIAGNOSIS — D124 Benign neoplasm of descending colon: Secondary | ICD-10-CM

## 2016-01-31 DIAGNOSIS — Z1211 Encounter for screening for malignant neoplasm of colon: Secondary | ICD-10-CM | POA: Diagnosis not present

## 2016-01-31 DIAGNOSIS — Z86010 Personal history of colonic polyps: Secondary | ICD-10-CM | POA: Diagnosis present

## 2016-01-31 MED ORDER — SODIUM CHLORIDE 0.9 % IV SOLN: 500.0000 mL | INTRAVENOUS | Status: DC

## 2016-01-31 NOTE — Progress Notes (Signed)
A and O x3. Report to RN. Tolerated MAC anesthesia well. 

## 2016-01-31 NOTE — Progress Notes (Signed)
Called to room to assist during endoscopic procedure.  Patient ID and intended procedure confirmed with present staff. Received instructions for my participation in the procedure from the performing physician.  

## 2016-01-31 NOTE — Op Note (Signed)
[place] Patient Name: Hayden Deleon Procedure Date: 01/31/2016 9:20 AM MRN: [id number] Endoscopist: [name] Pole , MD Age: 60 Referring MD:  Date of Birth: 12/03/1955 Gender: Male Account #: [id number] Procedure:                Colonoscopy Indications:              Surveillance: Personal history of adenomatous                            polyps on last colonoscopy > 5 years ago, Last                            colonoscopy: 2005 Medicines:                Monitored Anesthesia Care Procedure:                Pre-Anesthesia Assessment:                           - Prior to the procedure, a History and Physical                            was performed, and patient medications and                            allergies were reviewed. The patient's tolerance of                            previous anesthesia was also reviewed. The risks                            and benefits of the procedure and the sedation                            options and risks were discussed with the patient.                            All questions were answered, and informed consent                            was obtained. Prior Anticoagulants: The patient has                            taken no previous anticoagulant or antiplatelet                            agents. ASA Grade Assessment: II - A patient with                            mild systemic disease. After reviewing the risks                            and benefits, the patient was deemed in  satisfactory condition to undergo the procedure.                           After obtaining informed consent, the colonoscope                            was passed under direct vision. Throughout the                            procedure, the patient's blood pressure, pulse, and                            oxygen saturations were monitored continuously. The                            Model CF-HQ190L [id number]) scope was  introduced                            through the anus and advanced to the the terminal                            ileum, with identification of the appendiceal                            orifice and IC valve. The colonoscopy was performed                            without difficulty. The patient tolerated the                            procedure well. The quality of the bowel                            preparation was good. The terminal ileum, ileocecal                            valve, appendiceal orifice, and rectum were                            photographed. Scope In: 9:31:31 AM Scope Out: 9:53:36 AM Scope Withdrawal Time: 0 hours 19 minutes 12 seconds  Total Procedure Duration: 0 hours 22 minutes 5 seconds  Findings:                 The perianal and digital rectal examinations were                            normal.                           The terminal ileum appeared normal.                           A 12 mm polyp was found in the ascending colon. The  polyp was granular lateral spreading. The polyp was                            removed with a hot snare. Resection and retrieval                            were complete.                           A 1 mm polyp was found in the transverse colon. The                            polyp was sessile. The polyp was removed with a                            cold biopsy forceps. Resection and retrieval were                            complete.                           Two sessile polyps were found in the descending                            colon. The polyps were 2 to 3 mm in size. These                            polyps were removed with a cold snare. Resection                            and retrieval were complete.                           Multiple small-mouthed diverticula were found in                            the sigmoid colon and descending colon.                           Non-bleeding internal  hemorrhoids were found during                            retroflexion. The hemorrhoids were small. Complications:            No immediate complications. Estimated Blood Loss:     Estimated blood loss was minimal. Impression:               - The examined portion of the ileum was normal.                           - One 12 mm polyp in the ascending colon, removed                            with a hot snare. Resected and retrieved.                           -  One 1 mm polyp in the transverse colon, removed                            with a cold biopsy forceps. Resected and retrieved.                           - Two 2 to 3 mm polyps in the descending colon,                            removed with a cold snare. Resected and retrieved.                           - Diverticulosis in the sigmoid colon and in the                            descending colon.                           - Non-bleeding internal hemorrhoids. Recommendation:           - Patient has a contact number available for                            emergencies. The signs and symptoms of potential                            delayed complications were discussed with the                            patient. Return to normal activities tomorrow.                            Written discharge instructions were provided to the                            patient.                           - Resume previous diet.                           - Continue present medications.                           - Await pathology results.                           - Repeat colonoscopy in 3 years for surveillance                            based on pathology results. [name] Pole, MD 01/31/2016 10:01:16 AM This report has been signed electronically.

## 2016-01-31 NOTE — Patient Instructions (Signed)
YOU HAD AN ENDOSCOPIC PROCEDURE TODAY AT THE [facility]:   Refer to the procedure report that was given to you for any specific questions about what was found during the examination.  If the procedure report does not answer your questions, please call your gastroenterologist to clarify.  If you requested that your care partner not be given the details of your procedure findings, then the procedure report has been included in a sealed envelope for you to review at your convenience later.  YOU SHOULD EXPECT: Some feelings of bloating in the abdomen. Passage of more gas than usual.  Walking can help get rid of the air that was put into your GI tract during the procedure and reduce the bloating. If you had a lower endoscopy (such as a colonoscopy or flexible sigmoidoscopy) you may notice spotting of blood in your stool or on the toilet paper. If you underwent a bowel prep for your procedure, you may not have a normal bowel movement for a few days.  Please Note:  You might notice some irritation and congestion in your nose or some drainage.  This is from the oxygen used during your procedure.  There is no need for concern and it should clear up in a day or so.  SYMPTOMS TO REPORT IMMEDIATELY:   Following lower endoscopy (colonoscopy or flexible sigmoidoscopy):  Excessive amounts of blood in the stool  Significant tenderness or worsening of abdominal pains  Swelling of the abdomen that is new, acute  Fever of 100F or higher   For urgent or emergent issues, a gastroenterologist can be reached at any hour by calling [phone].   DIET:  We do recommend a small meal at first, but then you may proceed to your regular diet.  Drink plenty of fluids but you should avoid alcoholic beverages for 24 hours. Try to increase the fiber in your diet, and drink plenty of water.  ACTIVITY:  You should plan to take it easy for the rest of today and you should NOT DRIVE or use heavy machinery until  tomorrow (because of the sedation medicines used during the test).    FOLLOW UP: Our staff will call the number listed on your records the next business day following your procedure to check on you and address any questions or concerns that you may have regarding the information given to you following your procedure. If we do not reach you, we will leave a message.  However, if you are feeling well and you are not experiencing any problems, there is no need to return our call.  We will assume that you have returned to your regular daily activities without incident.  If any biopsies were taken you will be contacted by phone or by letter within the next 1-3 weeks.  Please call us at [phone] if you have not heard about the biopsies in 3 weeks.    SIGNATURES/CONFIDENTIALITY: You and/or your care partner have signed paperwork which will be entered into your electronic medical record.  These signatures attest to the fact that that the information above on your After Visit Summary has been reviewed and is understood.  Full responsibility of the confidentiality of this discharge information lies with you and/or your care-partner.  Read all of the handouts given to you by your recovery room nurse.  Thank-you for choosing us for your healthcare needs today. 

## 2016-02-01 ENCOUNTER — Telehealth: Payer: Self-pay

## 2016-02-01 NOTE — Telephone Encounter (Signed)
  Follow up Call-  Call back number 01/31/2016  Post procedure Call Back phone  # [phone]  Permission to leave phone message Yes  Some recent data might be hidden     Patient questions:  Do you have a fever, pain , or abdominal swelling? No. Pain Score  0 *  Have you tolerated food without any problems? Yes.    Have you been able to return to your normal activities? Yes.    Do you have any questions about your discharge instructions: Diet   No. Medications  No. Follow up visit  No.  Do you have questions or concerns about your Care? No.  Actions: * If pain score is 4 or above: No action needed, pain <4.  No problems per pt. maw

## 2016-02-08 ENCOUNTER — Ambulatory Visit (INDEPENDENT_AMBULATORY_CARE_PROVIDER_SITE_OTHER): Payer: BLUE CROSS/BLUE SHIELD

## 2016-02-08 ENCOUNTER — Other Ambulatory Visit: Payer: Self-pay

## 2016-02-08 ENCOUNTER — Ambulatory Visit: Payer: BLUE CROSS/BLUE SHIELD

## 2016-02-08 DIAGNOSIS — I2510 Atherosclerotic heart disease of native coronary artery without angina pectoris: Secondary | ICD-10-CM

## 2016-02-08 DIAGNOSIS — I6523 Occlusion and stenosis of bilateral carotid arteries: Secondary | ICD-10-CM | POA: Diagnosis not present

## 2016-02-08 DIAGNOSIS — I779 Disorder of arteries and arterioles, unspecified: Secondary | ICD-10-CM

## 2016-02-08 DIAGNOSIS — I739 Peripheral vascular disease, unspecified: Principal | ICD-10-CM

## 2016-02-09 ENCOUNTER — Other Ambulatory Visit: Payer: Self-pay | Admitting: Family Medicine

## 2016-02-14 ENCOUNTER — Encounter: Payer: Self-pay | Admitting: Gastroenterology

## 2016-02-16 ENCOUNTER — Other Ambulatory Visit: Payer: Self-pay | Admitting: *Deleted

## 2016-02-16 DIAGNOSIS — I739 Peripheral vascular disease, unspecified: Principal | ICD-10-CM

## 2016-02-16 DIAGNOSIS — I779 Disorder of arteries and arterioles, unspecified: Secondary | ICD-10-CM

## 2016-03-13 ENCOUNTER — Ambulatory Visit: Payer: BLUE CROSS/BLUE SHIELD | Admitting: Cardiovascular Disease

## 2016-04-07 ENCOUNTER — Other Ambulatory Visit: Payer: Self-pay | Admitting: Family Medicine

## 2016-07-24 ENCOUNTER — Other Ambulatory Visit: Payer: Self-pay | Admitting: Family Medicine

## 2016-07-25 ENCOUNTER — Other Ambulatory Visit: Payer: Self-pay | Admitting: Family Medicine

## 2016-08-20 ENCOUNTER — Other Ambulatory Visit: Payer: Self-pay | Admitting: Family Medicine

## 2016-08-20 ENCOUNTER — Encounter: Payer: Self-pay | Admitting: Family Medicine

## 2016-08-20 ENCOUNTER — Ambulatory Visit (INDEPENDENT_AMBULATORY_CARE_PROVIDER_SITE_OTHER): Payer: Self-pay | Admitting: Family Medicine

## 2016-08-20 ENCOUNTER — Ambulatory Visit (INDEPENDENT_AMBULATORY_CARE_PROVIDER_SITE_OTHER)
Admission: RE | Admit: 2016-08-20 | Discharge: 2016-08-20 | Disposition: A | Discharge status: Home / Self Care | Payer: Self-pay | Source: Ambulatory Visit | Attending: Family Medicine | Admitting: Family Medicine

## 2016-08-20 VITALS — BP 140/82 | HR 67 | Wt 210.0 lb

## 2016-08-20 DIAGNOSIS — E785 Hyperlipidemia, unspecified: Secondary | ICD-10-CM

## 2016-08-20 DIAGNOSIS — I10 Essential (primary) hypertension: Secondary | ICD-10-CM

## 2016-08-20 DIAGNOSIS — F411 Generalized anxiety disorder: Secondary | ICD-10-CM

## 2016-08-20 DIAGNOSIS — R0602 Shortness of breath: Secondary | ICD-10-CM

## 2016-08-20 DIAGNOSIS — R9389 Abnormal findings on diagnostic imaging of other specified body structures: Secondary | ICD-10-CM

## 2016-08-20 DIAGNOSIS — J441 Chronic obstructive pulmonary disease with (acute) exacerbation: Secondary | ICD-10-CM

## 2016-08-20 DIAGNOSIS — I779 Disorder of arteries and arterioles, unspecified: Secondary | ICD-10-CM

## 2016-08-20 DIAGNOSIS — I2510 Atherosclerotic heart disease of native coronary artery without angina pectoris: Secondary | ICD-10-CM

## 2016-08-20 DIAGNOSIS — J449 Chronic obstructive pulmonary disease, unspecified: Secondary | ICD-10-CM

## 2016-08-20 DIAGNOSIS — I739 Peripheral vascular disease, unspecified: Secondary | ICD-10-CM

## 2016-08-20 MED ORDER — ATORVASTATIN CALCIUM 40 MG PO TABS: 40.0000 mg | ORAL_TABLET | Freq: Every day | ORAL | 1 refills | Status: DC

## 2016-08-20 MED ORDER — METOPROLOL TARTRATE 50 MG PO TABS: 50.0000 mg | ORAL_TABLET | Freq: Two times a day (BID) | ORAL | 6 refills | Status: DC

## 2016-08-20 MED ORDER — SERTRALINE HCL 50 MG PO TABS: 50.0000 mg | ORAL_TABLET | Freq: Every day | ORAL | 4 refills | Status: DC

## 2016-08-20 MED ORDER — AZITHROMYCIN 250 MG PO TABS: ORAL_TABLET | ORAL | 0 refills | Status: DC

## 2016-08-20 NOTE — Progress Notes (Signed)
Very pleasant 61 yo male with h/o CAD (followed by cardiology-Dr. Yvone Neu), HLD on atorvastatin here for rx refills/follow up.   Sleeps well- wears his CPAP every night.   Always has some degree of CP or DOE- this is his baseline.  HLD- complaint with lipitor. Lab Results  Component Value Date   CHOL 137 01/03/2016   HDL 45.00 01/03/2016   LDLCALC 57 03/16/2015   LDLDIRECT 71.0 01/03/2016   TRIG 204.0 (H) 01/03/2016   CHOLHDL 3 01/03/2016   CAD- followed by cardiology.  Non smoker, non diabetic and he is active.    Lab Results  Component Value Date   PSA 1.27 01/03/2016   PSA 1.42 03/16/2015   PSA 1.03 04/26/2013   OSA and COPD- breathing worse this time of year, but no recent exacerbations. Feels spriva is not working as well as it used to.  Advair was previously ineffective. Has had some intermittent left sided back pain with deep inspirations over the past few weeks.  No increased mucous production or fevers.  Anxiety- symptoms well controlled on Zoloft.   Denies any symptoms of anxiety or depression. Allergies  Allergen Reactions  . Avelox [Moxifloxacin Hcl In Nacl] Other (See Comments)    Facial swelling  . Sulfamethoxazole-Trimethoprim Other (See Comments)    REACTION: sore mouth  . Aspirin Other (See Comments)    REACTION: Nausea; fine with 81mg     Current Outpatient Prescriptions  Medication Sig Dispense Refill  . aspirin 81 MG tablet Take 81 mg by mouth daily.      [name] atorvastatin (LIPITOR) 40 MG tablet TAKE 1 TABLET BY MOUTH DAILY 90 tablet 1  . cetirizine (ZYRTEC) 10 MG tablet Take 10 mg by mouth at bedtime.      . Multiple Vitamin (MULTIVITAMIN) capsule Take 1 capsule by mouth daily.      . sertraline (ZOLOFT) 50 MG tablet TAKE 1 TABLET BY MOUTH DAILY 30 tablet 4  . SPIRIVA HANDIHALER 18 MCG inhalation capsule PLACE 1 CAPSULE INTO INHALER AND INHALE ONCE DAILY AS DIRECTED 90 capsule 1  . VENTOLIN HFA 108 (90 Base) MCG/ACT inhaler USE 2 PUFFS EVERY 6 HOURS AS  NEEDED FOR WHEEZING 18 g 0  . metoprolol (LOPRESSOR) 50 MG tablet Take 1 tablet (50 mg total) by mouth 2 (two) times daily. 60 tablet 6   Current Facility-Administered Medications  Medication Dose Route Frequency Provider Last Rate Last Dose  . 0.9 %  sodium chloride infusion  500 mL Intravenous Continuous Nandigam, Venia Minks, MD        Social History   Social History  . Marital status: Married    Spouse name: N/A  . Number of children: 0  . Years of education: N/A   Occupational History  . 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 Smoking status: Former Smoker    Packs/day: 2.00    Years: 32.00    Types: Cigarettes    Quit date: 08/31/1999  . Smokeless tobacco: Former Systems developer  . Alcohol use No  . Drug use: No  . Sexual activity: Not on file   Other Topics Concern  . Not on file   Social History Narrative  . No narrative on file    Family History  Problem Relation Age of Onset  . Angina Mother        car accident  . Asthma Father   . Crohn's disease Sister   . Colon cancer Neg Hx   .  Esophageal cancer Neg Hx   . Rectal cancer Neg Hx   . Stomach cancer Neg Hx     Past Medical History:  Diagnosis Date  . Allergy   . Anxiety   . Arthritis    right shoulder  . CAD (coronary artery disease)    Catheterization, 2003, occluded OM1, good collaterals  //   nuclear, August, 2009, low risk, EF 60%  . Carotid artery disease ([place])    Doppler, February, 2012, 1-91% R. ICA, 47-82% LICA,  plan followup 1 year  . COPD (chronic obstructive pulmonary disease) (HCC)    Significant Dr. Lamonte Sakai  . Diverticulosis of colon   . Ejection fraction    EF 60%, nuclear, 2009  . GERD (gastroesophageal reflux disease)   . Hyperlipidemia   . Hypertension   . Prostatitis   . Sleep apnea    does not use CPAP    Past Surgical History:  Procedure Laterality Date  . COLONOSCOPY  12/2003   patterson - polyp  . deviated septum    . KNEE  ARTHROSCOPY     right  . WISDOM TOOTH EXTRACTION      Review of Systems  Constitutional: Negative.   HENT: Negative.   Respiratory: Positive for shortness of breath. Negative for choking, wheezing and stridor.   Gastrointestinal: Negative.   Musculoskeletal: Negative.   Skin: Negative.   Allergic/Immunologic: Negative.   Neurological: Negative.   Hematological: Negative.   Psychiatric/Behavioral: Negative.   All other systems reviewed and are negative.    Physical exam:  BP 140/82   Pulse 67   Wt 210 lb (95.3 kg)   SpO2 96%   BMI 30.13 kg/m  Wt Readings from Last 3 Encounters:  08/20/16 210 lb (95.3 kg)  01/31/16 221 lb (100.2 kg)  01/17/16 219 lb 12.8 oz (99.7 kg)   Physical Exam  Constitutional: He is well-developed, well-nourished, and in no distress. No distress.  HENT:  Head: Normocephalic.  Eyes: Conjunctivae are normal.  Neck: Normal range of motion.  Cardiovascular: Normal rate and regular rhythm.   Pulmonary/Chest: Breath sounds normal.  Musculoskeletal: Normal range of motion.  Neurological: He is alert.  Skin: Skin is warm and dry. He is not diaphoretic.  Psychiatric: Mood, memory, affect and judgment normal.  Nursing note and vitals reviewed.

## 2016-08-20 NOTE — Assessment & Plan Note (Signed)
Continue statin.  Lipid panel ordered. 

## 2016-08-20 NOTE — Addendum Note (Signed)
Addended by: Daralene Milch C on: 08/20/2016 11:30 AM   Modules accepted: Orders

## 2016-08-20 NOTE — Assessment & Plan Note (Signed)
Symptoms deteriorated but this is typical of this time of year- allergens, high humidity. Will check CXR though given pleuritic type of pain he is describing. The patient indicates understanding of these issues and agrees with the plan.

## 2016-08-20 NOTE — Assessment & Plan Note (Signed)
Reasonable control. No changes made. 

## 2016-08-20 NOTE — Assessment & Plan Note (Signed)
Well controlled on current dose of zoloft. No changes made. 

## 2016-08-20 NOTE — Progress Notes (Signed)
Pre visit review using our clinic review tool, if applicable. No additional management support is needed unless otherwise documented below in the visit note. 

## 2016-08-22 ENCOUNTER — Other Ambulatory Visit (INDEPENDENT_AMBULATORY_CARE_PROVIDER_SITE_OTHER): Payer: Self-pay

## 2016-08-22 DIAGNOSIS — I10 Essential (primary) hypertension: Secondary | ICD-10-CM

## 2016-08-22 DIAGNOSIS — E785 Hyperlipidemia, unspecified: Secondary | ICD-10-CM

## 2016-08-22 DIAGNOSIS — R0602 Shortness of breath: Secondary | ICD-10-CM

## 2016-08-22 LAB — CBC WITH DIFFERENTIAL/PLATELET
Basophils Absolute: 0.1 10*3/uL (ref 0.0–0.1)
Basophils Relative: 0.7 % (ref 0.0–3.0)
Eosinophils Absolute: 0.6 10*3/uL (ref 0.0–0.7)
Eosinophils Relative: 6.9 % — ABNORMAL HIGH (ref 0.0–5.0)
HCT: 43.7 % (ref 39.0–52.0)
Hemoglobin: 15.1 g/dL (ref 13.0–17.0)
Lymphocytes Relative: 24.9 % (ref 12.0–46.0)
Lymphs Abs: 2.3 10*3/uL (ref 0.7–4.0)
MCHC: 34.6 g/dL (ref 30.0–36.0)
MCV: 94.4 fl (ref 78.0–100.0)
Monocytes Absolute: 1 10*3/uL (ref 0.1–1.0)
Monocytes Relative: 10.9 % (ref 3.0–12.0)
Neutro Abs: 5.1 10*3/uL (ref 1.4–7.7)
Neutrophils Relative %: 56.6 % (ref 43.0–77.0)
Platelets: 236 10*3/uL (ref 150.0–400.0)
RBC: 4.63 Mil/uL (ref 4.22–5.81)
RDW: 13 % (ref 11.5–15.5)
WBC: 9.1 10*3/uL (ref 4.0–10.5)

## 2016-08-22 LAB — LIPID PANEL
Cholesterol: 131 mg/dL (ref 0–200)
HDL: 40.5 mg/dL (ref >39.00)
LDL Cholesterol: 71 mg/dL (ref 0–99)
NonHDL: 90.3
Total CHOL/HDL Ratio: 3
Triglycerides: 97 mg/dL (ref 0.0–149.0)
VLDL: 19.4 mg/dL (ref 0.0–40.0)

## 2016-08-22 LAB — VITAMIN B12: VITAMIN B 12: 292 pg/mL (ref 211–911)

## 2016-08-22 LAB — COMPREHENSIVE METABOLIC PANEL
ALT: 14 U/L (ref 0–53)
AST: 15 U/L (ref 0–37)
Albumin: 4 g/dL (ref 3.5–5.2)
Alkaline Phosphatase: 58 U/L (ref 39–117)
BUN: 13 mg/dL (ref 6–23)
CALCIUM: 9.6 mg/dL (ref 8.4–10.5)
CHLORIDE: 104 meq/L (ref 96–112)
CO2: 30 meq/L (ref 19–32)
Creatinine, Ser: 1.05 mg/dL (ref 0.40–1.50)
GFR: 76.39 mL/min (ref >60.00)
GLUCOSE: 93 mg/dL (ref 70–99)
Potassium: 3.8 mEq/L (ref 3.5–5.1)
Sodium: 140 mEq/L (ref 135–145)
Total Bilirubin: 0.8 mg/dL (ref 0.2–1.2)
Total Protein: 6.8 g/dL (ref 6.0–8.3)

## 2016-08-27 ENCOUNTER — Telehealth: Payer: Self-pay

## 2016-08-27 NOTE — Telephone Encounter (Signed)
I would suggest a course of prednisone at this point if he is comfortable with that plan.  Will route to [place].  Please route response to Webb Silversmith, NP as I am out of the office until Thursday.  Thank you.

## 2016-08-27 NOTE — Telephone Encounter (Signed)
Pt left v/m; pt seen 08/20/16; pt has finished zpak; pt feeling better but still difficulty moving air. Pt wants to next step. Pt request cb. [place].

## 2016-08-28 MED ORDER — PREDNISONE 10 MG PO TABS: ORAL_TABLET | ORAL | 0 refills | Status: DC

## 2016-08-28 NOTE — Telephone Encounter (Signed)
Prednisone sent to pharmacy

## 2016-08-28 NOTE — Telephone Encounter (Signed)
Mr. Gabrielle notified as instructed by telephone.  He is agreeable to doing a course of prednisone.  Will forward to Webb Silversmith to send in prescription to [facility].

## 2016-08-28 NOTE — Addendum Note (Signed)
Addended by: Jearld Fenton on: 08/28/2016 11:39 AM   Modules accepted: Orders

## 2016-09-06 ENCOUNTER — Ambulatory Visit (INDEPENDENT_AMBULATORY_CARE_PROVIDER_SITE_OTHER)
Admission: RE | Admit: 2016-09-06 | Discharge: 2016-09-06 | Disposition: A | Discharge status: Home / Self Care | Payer: BLUE CROSS/BLUE SHIELD | Source: Ambulatory Visit | Attending: Family Medicine | Admitting: Family Medicine

## 2016-09-06 DIAGNOSIS — R9389 Abnormal findings on diagnostic imaging of other specified body structures: Secondary | ICD-10-CM

## 2016-09-06 DIAGNOSIS — R938 Abnormal findings on diagnostic imaging of other specified body structures: Secondary | ICD-10-CM | POA: Diagnosis not present

## 2016-09-06 DIAGNOSIS — J90 Pleural effusion, not elsewhere classified: Secondary | ICD-10-CM | POA: Diagnosis not present

## 2016-09-06 DIAGNOSIS — J9811 Atelectasis: Secondary | ICD-10-CM | POA: Diagnosis not present

## 2016-09-06 MED ORDER — IOPAMIDOL (ISOVUE-300) INJECTION 61%: 75.0000 mL | Freq: Once | INTRAVENOUS | Status: DC | PRN

## 2016-09-09 ENCOUNTER — Telehealth: Payer: Self-pay

## 2016-09-09 DIAGNOSIS — J449 Chronic obstructive pulmonary disease, unspecified: Secondary | ICD-10-CM

## 2016-09-09 NOTE — Telephone Encounter (Signed)
Referral placed.

## 2016-09-09 NOTE — Telephone Encounter (Signed)
Pt was notified.  

## 2016-09-09 NOTE — Telephone Encounter (Signed)
Pt left v/m; pt received v/m about CT scan; pt would prefer not to see Dr Elsworth Soho again and request Dr Deborra Medina to refer to different pulmonologist. Pt request cb.

## 2016-09-24 NOTE — Progress Notes (Signed)
[facility] Consultation      Assessment and Plan:  The patient is a 61 year old male with a history of COPD and obstructive sleep apnea.  COPD with chronic dyspnea on exertion and yearly exacerbations. Elevated eosiniphil count of 300, up to 600 with exacerbations.  -Discussed management of COPD with patient. Will change from Spiriva to Trelegy inhaler -Encouraged to increase or maintain physical activity level. -Continue rescue inhaler as needed. -We'll send for full PFT. Will check alpha-1 antitrypsin screen  Sleep apnea.  -Encouraged patient to restart CPAP  Chronic left basal pleural scarring, with mild left mediastinal shift, as seen on CT chest. These changes appear chronic. -Changes appear chronic, no routine follow-up imaging needed.   Date: 09/24/2016  MRN# [id number] Hayden Deleon 1955-08-20    Hayden Deleon is a 61 y.o. old male seen in consultation for chief complaint of:    Chief Complaint  Patient presents with  . Advice Only    SOB at all times: prod cough at times; clears throat frequently:    HPI:   61 year old ex-smoker presents for FU of obstructive sleep apnea & COPD in 2014,  previously been seen by Dr. Elsworth Soho. He was maintained on Spiriva at that time. Recently he was seen by his primary care doctor, he was noted to be feeling increasingly congested, it felt like his allergies were acting up. He was treated with a course of Z-Pak and prednisone without resolution, was therefore sent for CT of the chest.  He went to see Dr. Deborra Medina because he was having some trouble breathing, he got a Z pack and a CXR which was abnormal. He then had a CT chest which was abnormal. He has trouble singing at church due to coughing. The cough is present in the morning and then improves during the day. He is on albuterol every morning due to chest tightness. He has been on spiriva in the past, but it ran out recently.  He has a flare up or bronchitis about  once per year.  He notes that he has dyspnea with walking and keeping up with people his own age. He works at [employer], he used to work in  Dispensing optician and notices that he is breathing better since he left.   He had recently lost about 10 pounds, he stopped using the CPAP about a month ago after the weight loss. Last smoke was 2001.   CBC 08/22/16; Eos=600 during an episode of AECOPD. At baseline he is at 300.   I personally reviewed, images; CT chest 09/06/16 shows severe diffuse panlobular emphysema, with some left basilar scarring, left pleural thickening, small effusion with leftward mediastinal shift..  12/28/2012 PSG showed -AHI 28 times/h  He did not sleep much on CPAP - start CPAP 7cm , pillows, humidity,  Download 10/28/12 on 7 cm - AHI 5/h, good usage, no leak     PMHX:   Past Medical History:  Diagnosis Date  . Allergy   . Anxiety   . Arthritis    right shoulder  . CAD (coronary artery disease)    Catheterization, 2003, occluded OM1, good collaterals  //   nuclear, August, 2009, low risk, EF 60%  . Carotid artery disease ([place])    Doppler, February, 2012, 6-78% R. ICA, 93-81% LICA,  plan followup 1 year  . COPD (chronic obstructive pulmonary disease) (HCC)    Significant Dr. Lamonte Sakai  . Diverticulosis of colon   . Ejection fraction  EF 60%, nuclear, 2009  . GERD (gastroesophageal reflux disease)   . Hyperlipidemia   . Hypertension   . Prostatitis   . Sleep apnea    does not use CPAP   Surgical Hx:  Past Surgical History:  Procedure Laterality Date  . COLONOSCOPY  12/2003   patterson - polyp  . deviated septum    . KNEE ARTHROSCOPY     right  . WISDOM TOOTH EXTRACTION     Family Hx:  Family History  Problem Relation Age of Onset  . Angina Mother        car accident  . Asthma Father   . Crohn's disease Sister   . Colon cancer Neg Hx   . Esophageal cancer Neg Hx   . Rectal cancer Neg Hx   . Stomach cancer Neg Hx    Social Hx:     Social History  Substance Use Topics  . Smoking status: Former Smoker    Packs/day: 2.00    Years: 32.00    Types: Cigarettes    Quit date: 08/31/1999  . Smokeless tobacco: Former Systems developer  . Alcohol use No   Medication:    Current Outpatient Prescriptions:  .  aspirin 81 MG tablet, Take 81 mg by mouth daily.  , Disp: , Rfl:  .  atorvastatin (LIPITOR) 40 MG tablet, Take 1 tablet (40 mg total) by mouth daily., Disp: 90 tablet, Rfl: 1 .  azithromycin (ZITHROMAX) 250 MG tablet, Take 2 tabs today, then 1 tab daily x 4 days, Disp: 6 tablet, Rfl: 0 .  cetirizine (ZYRTEC) 10 MG tablet, Take 10 mg by mouth at bedtime.  , Disp: , Rfl:  .  metoprolol tartrate (LOPRESSOR) 50 MG tablet, Take 1 tablet (50 mg total) by mouth 2 (two) times daily., Disp: 60 tablet, Rfl: 6 .  Multiple Vitamin (MULTIVITAMIN) capsule, Take 1 capsule by mouth daily.  , Disp: , Rfl:  .  predniSONE (DELTASONE) 10 MG tablet, Take 6 tabs day 1, 5 tabs day 2, 4 tabs day 3, 3 tabs day 4, 2 tabs day 5, 1 tab day 6, Disp: 21 tablet, Rfl: 0 .  sertraline (ZOLOFT) 50 MG tablet, Take 1 tablet (50 mg total) by mouth daily., Disp: 30 tablet, Rfl: 4 .  SPIRIVA HANDIHALER 18 MCG inhalation capsule, PLACE 1 CAPSULE INTO INHALER AND INHALE ONCE DAILY AS DIRECTED, Disp: 90 capsule, Rfl: 1 .  VENTOLIN HFA 108 (90 Base) MCG/ACT inhaler, INHALE TWO PUFFS EVERY 6 HOURS AS NEEDEDFOR WHEEZING, Disp: 18 g, Rfl: 1  Current Facility-Administered Medications:  .  0.9 %  sodium chloride infusion, 500 mL, Intravenous, Continuous, Nandigam, Venia Minks, MD   Allergies:  Avelox [moxifloxacin hcl in nacl]; Sulfamethoxazole-trimethoprim; and Aspirin  Review of Systems: Gen:  Denies  fever, sweats, chills HEENT: Denies blurred vision, double vision. bleeds, sore throat Cvc:  No dizziness, chest pain. Resp:   Denies cough or sputum production, shortness of breath Gi: Denies swallowing difficulty, stomach pain. Gu:  Denies bladder incontinence, burning  urine Ext:   No Joint pain, stiffness. Skin: No skin rash,  hives  Endoc:  No polyuria, polydipsia. Psych: No depression, insomnia. Other:  All other systems were reviewed with the patient and were negative other that what is mentioned in the HPI.   Physical Examination:   VS: BP 128/72 (BP Location: Left Arm, Cuff Size: Normal)   Pulse 61   Ht 5\' 10"  (1.778 m)   Wt 212 lb (96.2 kg)   SpO2  92%   BMI 30.42 kg/m   General Appearance: No distress  Neuro:without focal findings,  speech normal,  HEENT: PERRLA, EOM intact.   Pulmonary: normal breath sounds, No wheezing, decreased air entry bilaterally. CardiovascularNormal S1,S2.  No m/r/g.   Abdomen: Benign, Soft, non-tender. Renal:  No costovertebral tenderness  GU:  No performed at this time. Endoc: No evident thyromegaly, no signs of acromegaly. Skin:   warm, no rashes, no ecchymosis  Extremities: normal, no cyanosis, clubbing.  Other findings:    LABORATORY PANEL:   CBC No results for input(s): WBC, HGB, HCT, PLT in the last 168 hours. ------------------------------------------------------------------------------------------------------------------  Chemistries  No results for input(s): NA, K, CL, CO2, GLUCOSE, BUN, CREATININE, CALCIUM, MG, AST, ALT, ALKPHOS, BILITOT in the last 168 hours.  Invalid input(s): GFRCGP ------------------------------------------------------------------------------------------------------------------  Cardiac Enzymes No results for input(s): TROPONINI in the last 168 hours. ------------------------------------------------------------  RADIOLOGY:  No results found.     Thank  you for the consultation and for allowing [facility] to assist in the care of your patient. Our recommendations are noted above.  Please contact us if we can be of further service.   Marda Stalker, MD.  Board Certified in Internal Medicine, Pulmonary Medicine, [place], and Sleep Medicine.  [facility] Office Number: [phone]  Patricia Pesa, M.D.  Merton Border, M.D  09/24/2016

## 2016-09-30 ENCOUNTER — Encounter: Payer: Self-pay | Admitting: Internal Medicine

## 2016-09-30 ENCOUNTER — Ambulatory Visit (INDEPENDENT_AMBULATORY_CARE_PROVIDER_SITE_OTHER): Payer: BLUE CROSS/BLUE SHIELD | Admitting: Internal Medicine

## 2016-09-30 VITALS — BP 128/72 | HR 61 | Ht 70.0 in | Wt 212.0 lb

## 2016-09-30 DIAGNOSIS — J449 Chronic obstructive pulmonary disease, unspecified: Secondary | ICD-10-CM | POA: Diagnosis not present

## 2016-09-30 DIAGNOSIS — G4733 Obstructive sleep apnea (adult) (pediatric): Secondary | ICD-10-CM

## 2016-09-30 MED ORDER — FLUTICASONE-UMECLIDIN-VILANT 100-62.5-25 MCG/INH IN AEPB: 1.0000 | INHALATION_SPRAY | Freq: Once | RESPIRATORY_TRACT | 11 refills | Status: AC

## 2016-09-30 NOTE — Addendum Note (Signed)
Addended by: Oscar La R on: 09/30/2016 09:20 AM   Modules accepted: Orders

## 2016-09-30 NOTE — Patient Instructions (Addendum)
--  Keep up with physical activity.   --Restart CPAP.

## 2016-11-06 DIAGNOSIS — M5416 Radiculopathy, lumbar region: Secondary | ICD-10-CM | POA: Diagnosis not present

## 2016-12-10 ENCOUNTER — Ambulatory Visit: Payer: BLUE CROSS/BLUE SHIELD | Admitting: Cardiovascular Disease

## 2016-12-12 IMAGING — MR MR LUMBAR SPINE W/O CM
4 of 5 series · 25 of 48 positions shown · non-contrast
Comparison: None.

CLINICAL DATA: Low back pain. LEFT greater than RIGHT leg numbness
and pain for 1 year.

EXAM:
MRI LUMBAR SPINE WITHOUT CONTRAST
TECHNIQUE: Multiplanar, multisequence MR imaging of the lumbar spine was
performed. No intravenous contrast was administered.

[Series 5: T1 · sagittal · 4.0mm · 0.55mm/px · 7 of 15 slices shown (1 of 2)]
[im 1/15]
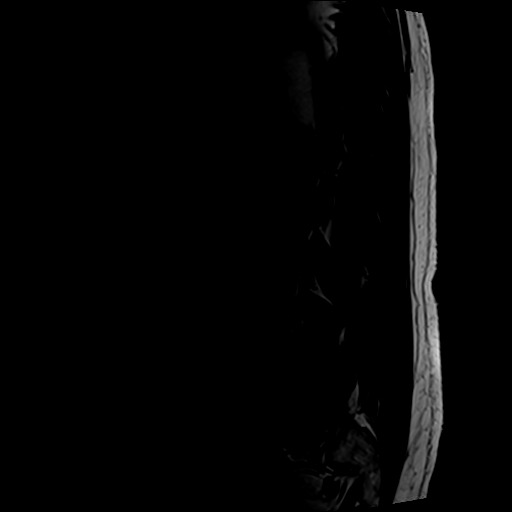
[im 3/15]
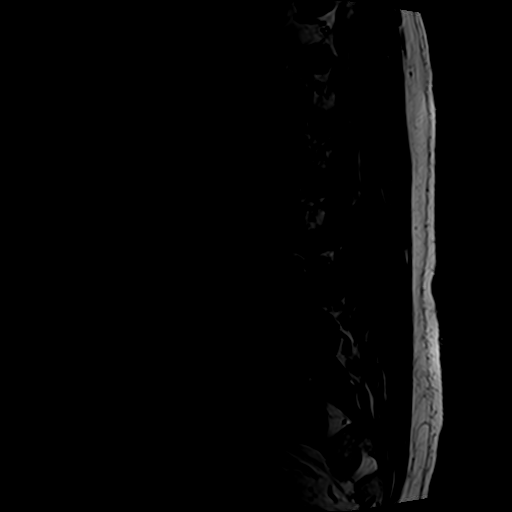
[im 5/15]
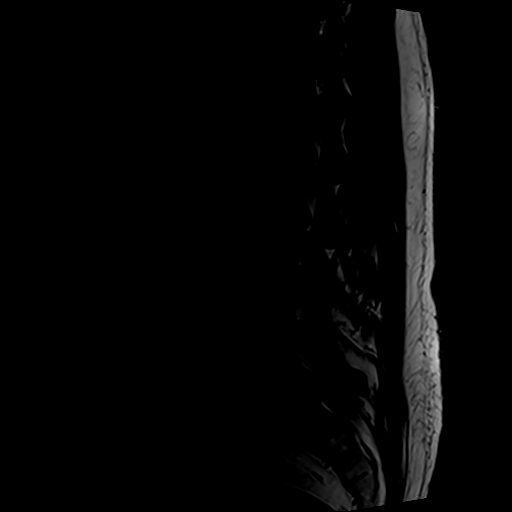
[im 8/15]
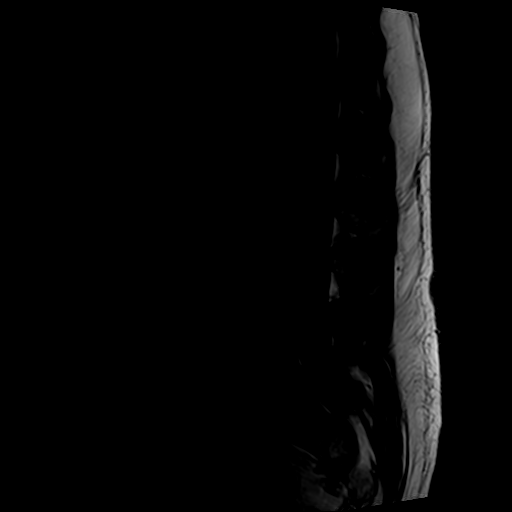
[im 10/15]
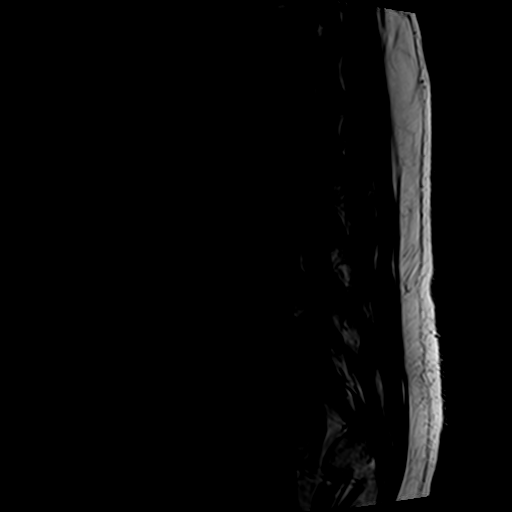
[im 12/15]
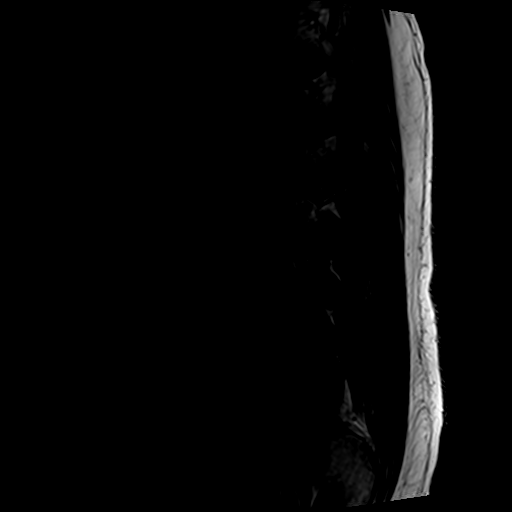
[im 15/15]
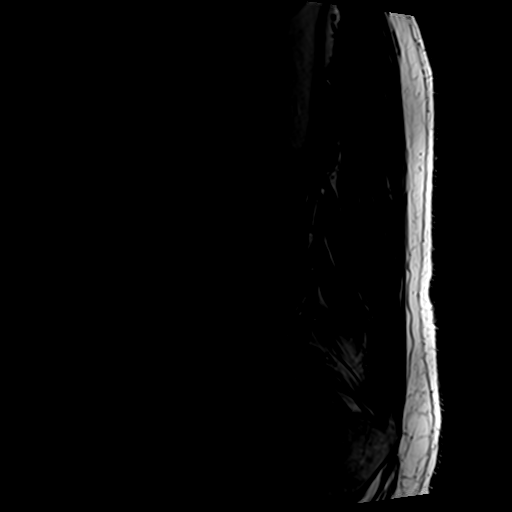

[Series 6: T2 · sagittal · 4.0mm · 0.55mm/px · 7 of 15 slices shown (1 of 2)]
[im 1/15]
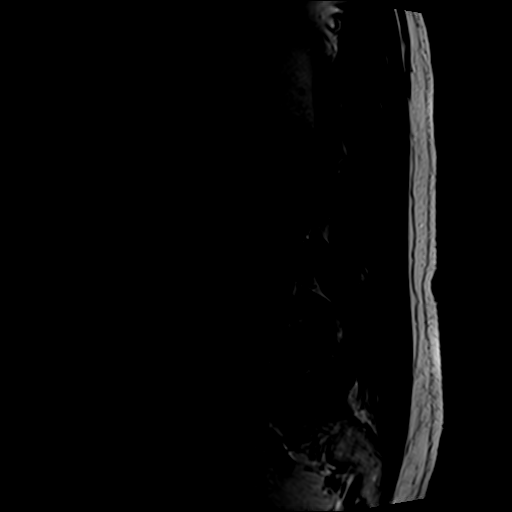
[im 3/15]
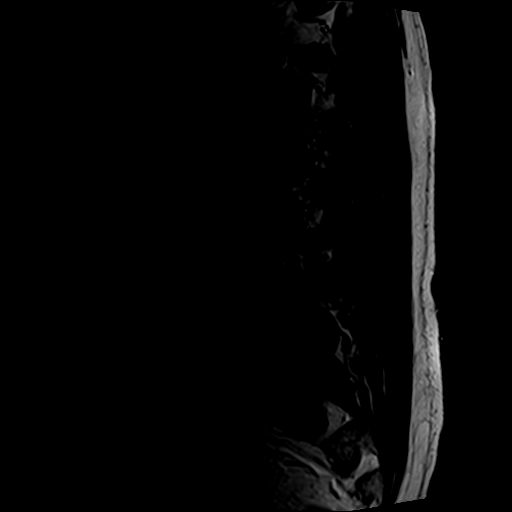
[im 5/15]
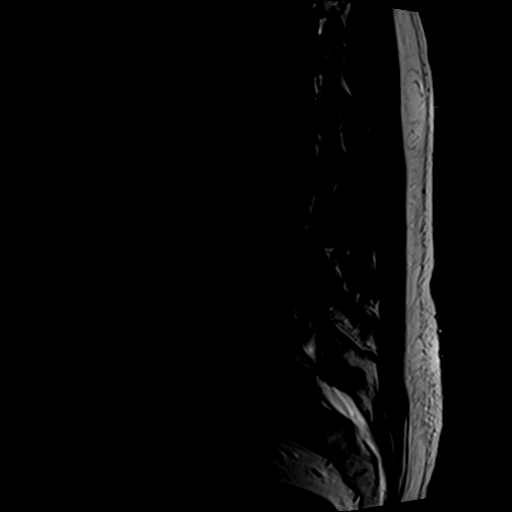
[im 8/15]
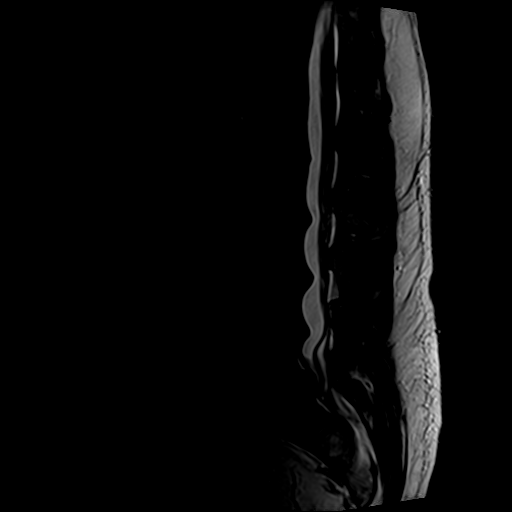
[im 10/15]
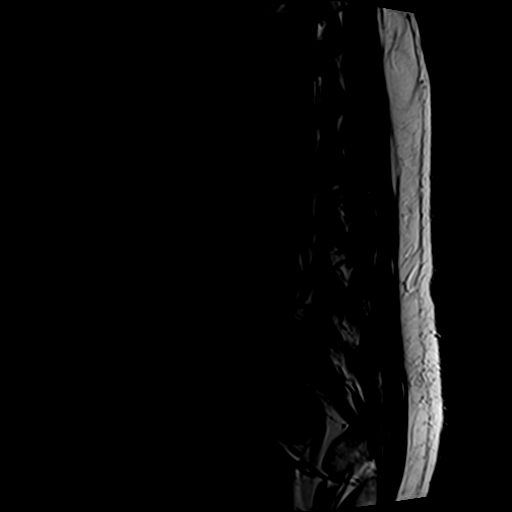
[im 12/15]
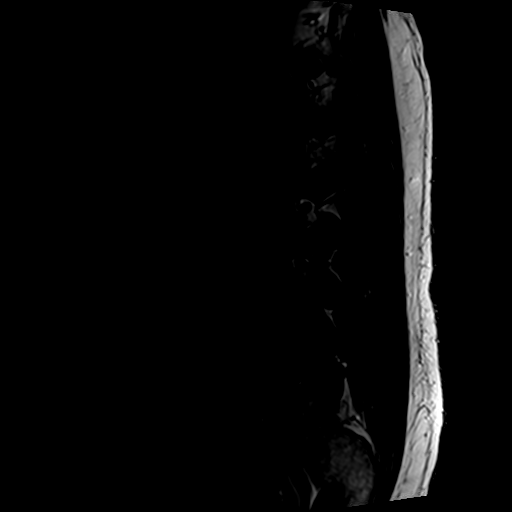
[im 15/15]
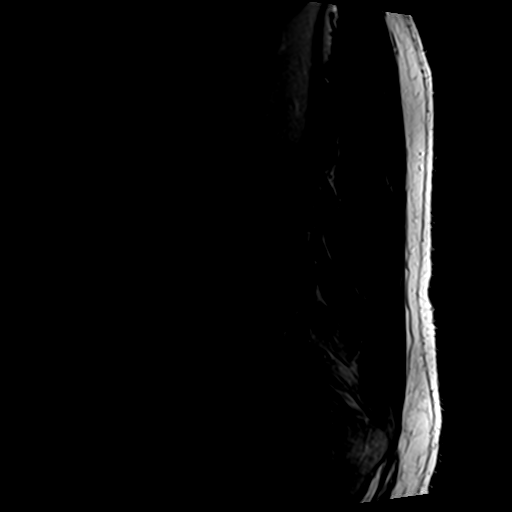

[Series 7: T2 · axial · 4.0mm · 0.70mm/px · z∈[-58,+101]mm · 8 of 29 slices shown (2 of 2)]
[im 1/29]
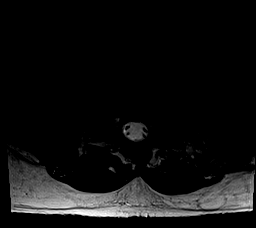
[im 5/29]
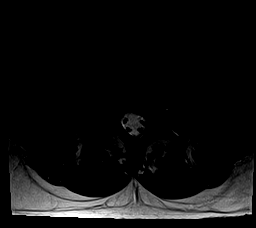
[im 9/29]
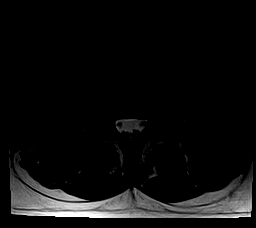
[im 13/29]
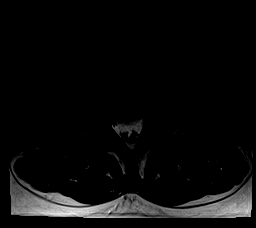
[im 16/29]
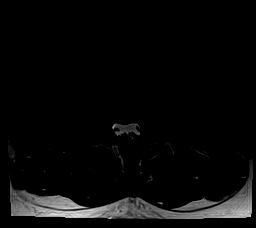
[im 20/29]
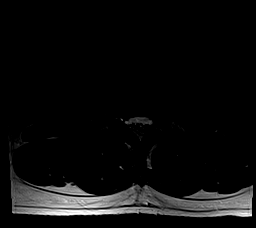
[im 24/29]
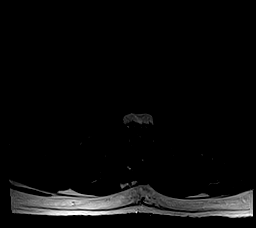
[im 29/29]
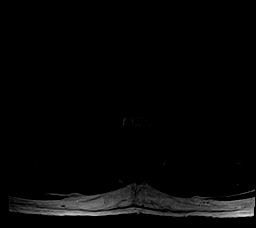

[Series 8: T1 · axial · 4.0mm · 0.35mm/px · z∈[-39,+75]mm · 3 of 29 slices shown (2 of 2)]
[im 5/29]
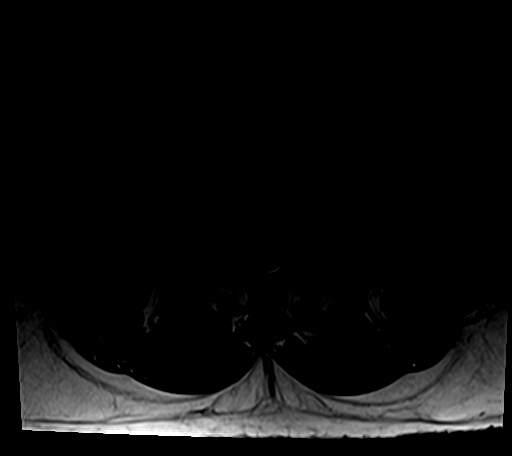
[im 16/29]
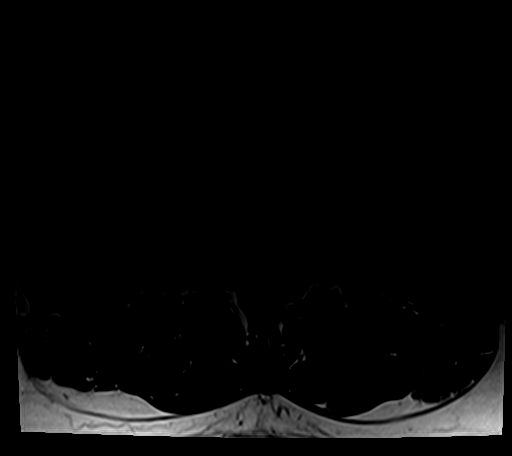
[im 24/29]
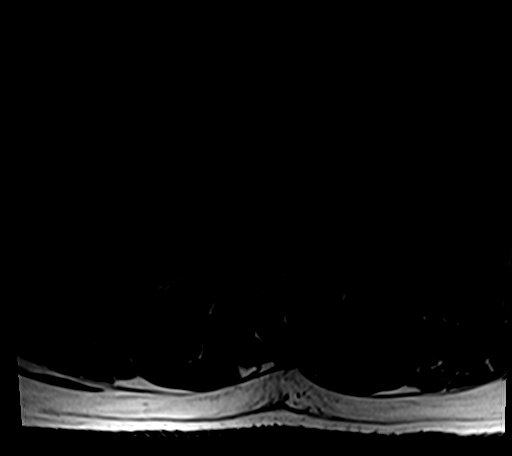

[25 of 48 positions shown; findings below may reference images not displayed]

FINDINGS: The patient was unable to remain motionless for the exam. Small or
subtle lesions could be overlooked.

Segmentation:  Normal

Alignment:  Normal

Vertebrae:  No worrisome osseous lesion

Conus medullaris: Extends to the L1 level and appears normal.

Paraspinal and other soft tissues: Unremarkable

Disc levels:

L1-L2: Mild disc space narrowing. Annular bulging. No impingement.

L2-L3: Mild Disc space narrowing. Annular bulging. Mild facet
arthropathy. No impingement.

L3-L4: Severe disc space narrowing. Central and leftward protrusion.
Posterior element hypertrophy, also worse on the LEFT. Mild
stenosis. LEFT subarticular zone and foraminal zone narrowing
affecting the L3 and L4 nerve roots.

L4-L5: Severe disc space narrowing. Central protrusion. Posterior
element hypertrophy. Mild stenosis. Mild subarticular zone and
foraminal zone narrowing could affect the L4 and L5 nerve roots.

L5-S1: Moderate disc space narrowing. Central protrusion. Posterior
element hypertrophy. No subarticular zone narrowing. Severe LEFT
greater than RIGHT BILATERAL foraminal narrowing due to disc
material, bony overgrowth, and loss of interspace height compress
both L5 nerve roots.
IMPRESSION: The dominant abnormality is at L5-S1 where severe BILATERAL
foraminal narrowing, LEFT greater than RIGHT, multifactorial,
compress both L5 nerve roots.

Similar less severe changes at L3-4 and L4-5.  See discussion above.

## 2016-12-24 ENCOUNTER — Ambulatory Visit: Payer: BLUE CROSS/BLUE SHIELD | Attending: Internal Medicine

## 2016-12-24 DIAGNOSIS — J449 Chronic obstructive pulmonary disease, unspecified: Secondary | ICD-10-CM | POA: Insufficient documentation

## 2016-12-24 MED ORDER — ALBUTEROL SULFATE (2.5 MG/3ML) 0.083% IN NEBU
2.5000 mg | INHALATION_SOLUTION | Freq: Once | RESPIRATORY_TRACT | Status: AC
  Administered 2016-12-24: 2.5 mg via RESPIRATORY_TRACT
  Filled 2016-12-24: qty 3

## 2016-12-30 ENCOUNTER — Ambulatory Visit (INDEPENDENT_AMBULATORY_CARE_PROVIDER_SITE_OTHER): Payer: BLUE CROSS/BLUE SHIELD | Admitting: Internal Medicine

## 2016-12-30 ENCOUNTER — Encounter: Payer: Self-pay | Admitting: Internal Medicine

## 2016-12-30 VITALS — BP 136/82 | HR 64 | Resp 16 | Ht 70.0 in | Wt 222.0 lb

## 2016-12-30 DIAGNOSIS — J449 Chronic obstructive pulmonary disease, unspecified: Secondary | ICD-10-CM | POA: Diagnosis not present

## 2016-12-30 DIAGNOSIS — G4733 Obstructive sleep apnea (adult) (pediatric): Secondary | ICD-10-CM

## 2016-12-30 NOTE — Progress Notes (Addendum)
[facility] Consultation      Assessment and Plan:  The patient is a 61 year old male with a severe COPD and obstructive sleep apnea.  COPD with chronic dyspnea on exertion and yearly exacerbations. Elevated eosiniphil count of 300, up to 600 with exacerbations.  -Discussed management of COPD with patient.  -Encouraged to increase or maintain physical activity level. -Continue Trelegy inhaler  --Pneumovax administered 2016; can administer Prevnar when available.  --Discussed that he needs a new flu vaccine.    Sleep apnea.  -Encouraged patient to restart CPAP  Chronic left basal pleural scarring, with mild left mediastinal shift, as seen on CT chest. These changes appear chronic. -Changes appear chronic, no routine follow-up imaging needed.   Date: 12/30/2016  MRN# [id number] Hayden Deleon [birth year]    Hayden Deleon is a 61 y.o. old male seen in consultation for chief complaint of:    Chief Complaint  Patient presents with  . COPD    Follow up with PFT/Alpha 1  . Shortness of Breath    with exhertion but better on Trelegy  . Cough    mostly non productive  . Wheezing    slight but not really    HPI:   61 year old ex-smoker presents for FU of obstructive sleep apnea & COPD. last visit, it was noted that the patient had some dyspnea, and eosinophils were elevated. Therefore, he was switched from Spiriva to Trelegy inhaler. He was also sent for a PFT, alpha-1 screening. He was also encouraged to restart using his CPAP.  He notes that he is doing better with Trelegy, he has also started using the CPAP every night for the whole night. He has only used his rescue inhaler once in the past few months.    **Pneumovax administered 12/30/16  **PFT 12/24/16; Spirometry: FVC was 62% of predicted, FEV1 was 33% of predicted, ratio is 43%. Flow volume loop is consistent with obstruction. There is significant improvement with bronchodilator therapy. Lung  volumes: TLC is 78% of predicted, RV to TLC ratio was normal. DLCO is 49% of predicted. -Overall this test is consistent with severe COPD with reversibility, there may also be an element of mild restrictive lung disease.  **CBC 08/22/16; Eos=600 during an episode of AECOPD. At baseline he is at 300.   **CT chest 09/06/16 shows severe diffuse panlobular emphysema, with some left basilar scarring, left pleural thickening, small effusion with leftward mediastinal shift..  12/28/2012 PSG showed -AHI 28 times/h  He did not sleep much on CPAP - start CPAP 7cm , pillows, humidity,  Download 10/28/12 on 7 cm - AHI 5/h, good usage, no leak    Allergies:  Avelox [moxifloxacin hcl in nacl]; Sulfamethoxazole-trimethoprim; and Aspirin  Review of Systems: Gen:  Denies  fever, sweats, chills HEENT: Denies blurred vision, double vision. bleeds, sore throat Cvc:  No dizziness, chest pain. Resp:   Denies cough or sputum production, shortness of breath Gi: Denies swallowing difficulty, stomach pain. Gu:  Denies bladder incontinence, burning urine Ext:   No Joint pain, stiffness. Skin: No skin rash,  hives  Endoc:  No polyuria, polydipsia. Psych: No depression, insomnia. Other:  All other systems were reviewed with the patient and were negative other that what is mentioned in the HPI.   Physical Examination:   VS: BP 136/82 (BP Location: Left Arm, Cuff Size: Normal)   Pulse 64   Resp 16   Ht 5\' 10"  (1.778 m)   Wt 222 lb (100.7 kg)  SpO2 93%   BMI 31.85 kg/m   General Appearance: No distress  Neuro:without focal findings,  speech normal,  HEENT: PERRLA, EOM intact.   Pulmonary: normal breath sounds, No wheezing, decreased air entry bilaterally. CardiovascularNormal S1,S2.  No m/r/g.   Abdomen: Benign, Soft, non-tender. Renal:  No costovertebral tenderness  GU:  No performed at this time. Endoc: No evident thyromegaly, no signs of acromegaly. Skin:   warm, no rashes, no ecchymosis    Extremities: normal, no cyanosis, clubbing.  Other findings:    LABORATORY PANEL:   CBC No results for input(s): WBC, HGB, HCT, PLT in the last 168 hours. ------------------------------------------------------------------------------------------------------------------  Chemistries  No results for input(s): NA, K, CL, CO2, GLUCOSE, BUN, CREATININE, CALCIUM, MG, AST, ALT, ALKPHOS, BILITOT in the last 168 hours.  Invalid input(s): GFRCGP ------------------------------------------------------------------------------------------------------------------  Cardiac Enzymes No results for input(s): TROPONINI in the last 168 hours. ------------------------------------------------------------  RADIOLOGY:  No results found.     Thank  you for the consultation and for allowing [facility] to assist in the care of your patient. Our recommendations are noted above.  Please contact us if we can be of further service.   Marda Stalker, MD.  Board Certified in Internal Medicine, Pulmonary Medicine, [place], and Sleep Medicine.  [facility] Office Number: [phone]  Patricia Pesa, M.D.  Merton Border, M.D  12/30/2016

## 2016-12-30 NOTE — Patient Instructions (Signed)
Continue to be as active as possible.  Continue to use CPAP every night.  Continue on trelegy.

## 2017-01-24 ENCOUNTER — Encounter: Payer: Self-pay | Admitting: Internal Medicine

## 2017-02-17 ENCOUNTER — Encounter: Payer: Self-pay | Admitting: Cardiovascular Disease

## 2017-02-17 ENCOUNTER — Ambulatory Visit (INDEPENDENT_AMBULATORY_CARE_PROVIDER_SITE_OTHER): Payer: BLUE CROSS/BLUE SHIELD | Admitting: Cardiovascular Disease

## 2017-02-17 VITALS — BP 136/62 | HR 64 | Ht 70.0 in | Wt 225.8 lb

## 2017-02-17 DIAGNOSIS — I10 Essential (primary) hypertension: Secondary | ICD-10-CM

## 2017-02-17 DIAGNOSIS — E785 Hyperlipidemia, unspecified: Secondary | ICD-10-CM | POA: Diagnosis not present

## 2017-02-17 DIAGNOSIS — I25118 Atherosclerotic heart disease of native coronary artery with other forms of angina pectoris: Secondary | ICD-10-CM

## 2017-02-17 DIAGNOSIS — Z7689 Persons encountering health services in other specified circumstances: Secondary | ICD-10-CM | POA: Diagnosis not present

## 2017-02-17 MED ORDER — ISOSORBIDE MONONITRATE ER 30 MG PO TB24: 30.0000 mg | ORAL_TABLET | Freq: Every day | ORAL | 3 refills | Status: DC

## 2017-02-17 NOTE — Patient Instructions (Signed)
Medication Instructions:  Your physician has recommended you make the following change in your medication:  START taking imdur 30mg once daily    Labwork: none  Testing/Procedures: none  Follow-Up: Your physician wants you to follow-up in: 6 months with Dr. Arida.  You will receive a reminder letter in the mail two months in advance. If you don't receive a letter, please call our office to schedule the follow-up appointment.   Any Other Special Instructions Will Be Listed Below (If Applicable).     If you need a refill on your cardiac medications before your next appointment, please call your pharmacy.   

## 2017-02-17 NOTE — Progress Notes (Signed)
Cardiology Office Note   Date:  02/17/2017   ID:  Hayden Deleon 11/12/1955, MRN [id number]  PCP:  Lucille Passy, MD  Cardiologist:   [name], MD   Chief Complaint  Patient presents with  . other    Former [facility] pt 6 month f/u c/o chest tightness worse after eating. Meds reviewed verbally with pt.      History of Present Illness: Hayden Deleon is a 61 y.o. male who presents for to establish cardiovascular care with me.  He was seen by Dr. Yvone Neu last year.  He has known history of coronary artery disease.  Previous cardiac catheterization in 2003 showed occluded OM1 with collaterals with 60-70% proximal RCA stenosis.  Ejection fraction was normal.  The patient has been managed medically since then.  He has known history of COPD and quit smoking in 2005.  He has hyperlipidemia and hypertension. He reports prolonged symptoms of angina described as substernal chest tightness with physical activity such as going up stairs.  His symptoms are usually worse after he eats.  He reports that this pattern has been stable for years. He had a pharmacologic nuclear stress test done in October 2017 which was normal.  Echocardiogram in November 2017 was also normal.   Past Medical History:  Diagnosis Date  . Allergy   . Anxiety   . Arthritis    right shoulder  . CAD (coronary artery disease)    Catheterization, 2003, occluded OM1, good collaterals  //   nuclear, August, 2009, low risk, EF 60%  . Carotid artery disease ([place])    Doppler, February, 2012, 5-70% R. ICA, 17-79% LICA,  plan followup 1 year  . COPD (chronic obstructive pulmonary disease) (HCC)    Significant Dr. Lamonte Sakai  . Diverticulosis of colon   . Ejection fraction    EF 60%, nuclear, 2009  . GERD (gastroesophageal reflux disease)   . Hyperlipidemia   . Hypertension   . Prostatitis   . Sleep apnea    does not use CPAP    Past Surgical History:  Procedure Laterality Date  . COLONOSCOPY  12/2003   patterson - polyp  . deviated septum    . KNEE ARTHROSCOPY     right  . WISDOM TOOTH EXTRACTION       Current Outpatient Medications  Medication Sig Dispense Refill  . aspirin 81 MG tablet Take 81 mg by mouth daily.      [name] atorvastatin (LIPITOR) 40 MG tablet Take 1 tablet (40 mg total) by mouth daily. 90 tablet 1  . metoprolol tartrate (LOPRESSOR) 50 MG tablet Take 1 tablet (50 mg total) by mouth 2 (two) times daily. 60 tablet 6  . Multiple Vitamin (MULTIVITAMIN) capsule Take 1 capsule by mouth daily.      . sertraline (ZOLOFT) 50 MG tablet Take 1 tablet (50 mg total) by mouth daily. 30 tablet 4  . TRELEGY ELLIPTA 100-62.5-25 MCG/INH AEPB USE 1 PUFF DAILY AS DIRECTED  10  . VENTOLIN HFA 108 (90 Base) MCG/ACT inhaler INHALE TWO PUFFS EVERY 6 HOURS AS NEEDEDFOR WHEEZING 18 g 1   Current Facility-Administered Medications  Medication Dose Route Frequency Provider Last Rate Last Dose  . 0.9 %  sodium chloride infusion  500 mL Intravenous Continuous Nandigam, Venia Minks, MD        Allergies:   Avelox [moxifloxacin hcl in nacl]; Sulfamethoxazole-trimethoprim; and Aspirin    Social History:  The patient  reports that he quit smoking about  17 years ago. His smoking use included cigarettes. He has a 64.00 pack-year smoking history. He has quit using smokeless tobacco. He reports that he does not drink alcohol or use drugs.   Family History:  The patient's family history includes Angina in his mother; Asthma in his father; Crohn's disease in his sister.    ROS:  Please see the history of present illness.   Otherwise, review of systems are positive for none.   All other systems are reviewed and negative.    PHYSICAL EXAM: VS:  BP 136/62 (BP Location: Left Arm, Patient Position: Sitting, Cuff Size: Normal)   Pulse 64   Ht 5\' 10"  (1.778 m)   Wt 225 lb 12 oz (102.4 kg)   BMI 32.39 kg/m  , BMI Body mass index is 32.39 kg/m. GEN: Well nourished, well developed, in no acute distress  HEENT:  normal  Neck: no JVD, carotid bruits, or masses Cardiac: RRR; no murmurs, rubs, or gallops,no edema  Respiratory:  clear to auscultation bilaterally, normal work of breathing GI: soft, nontender, nondistended, + BS MS: no deformity or atrophy  Skin: warm and dry, no rash Neuro:  Strength and sensation are intact Psych: euthymic mood, full affect   EKG:  EKG is ordered today. The ekg ordered today demonstrates normal sinus rhythm with no significant ST or T wave changes.   Recent Labs: 08/22/2016: ALT 14; BUN 13; Creatinine, Ser 1.05; Hemoglobin 15.1; Platelets 236.0; Potassium 3.8; Sodium 140    Lipid Panel    Component Value Date/Time   CHOL 131 08/22/2016 0754   TRIG 97.0 08/22/2016 0754   HDL 40.50 08/22/2016 0754   CHOLHDL 3 08/22/2016 0754   VLDL 19.4 08/22/2016 0754   LDLCALC 71 08/22/2016 0754   LDLDIRECT 71.0 01/03/2016 1201      Wt Readings from Last 3 Encounters:  02/17/17 225 lb 12 oz (102.4 kg)  12/30/16 222 lb (100.7 kg)  09/30/16 212 lb (96.2 kg)       PAD Screen 01/17/2016  Previous PAD dx? No  Previous surgical procedure? No  Pain with walking? Yes  Subsides with rest? Yes  Feet/toe relief with dangling? No  Painful, non-healing ulcers? No  Extremities discolored? No      ASSESSMENT AND PLAN:  1.  Coronary artery disease involving native coronary arteries with stable angina: The patient's symptoms have been stable overall.  He had a nuclear stress test last year which was normal.  His EKG today is normal.  I elected to add Imdur 30 mg once daily for symptomatic relief.  2.  Hyperlipidemia: Continue treatment with atorvastatin.  I reviewed most recent lipid profile in May which showed an LDL of 71 which is close to target.  3.  Essential hypertension: Blood pressure is controlled on metoprolol.    Disposition:   FU with me in 6 months  Signed,  [name], MD  02/17/2017 3:39 PM    [place]

## 2017-04-02 ENCOUNTER — Encounter: Payer: Self-pay | Admitting: Family Medicine

## 2017-04-02 ENCOUNTER — Ambulatory Visit (INDEPENDENT_AMBULATORY_CARE_PROVIDER_SITE_OTHER): Payer: BLUE CROSS/BLUE SHIELD | Admitting: Family Medicine

## 2017-04-02 VITALS — BP 140/78 | HR 57 | Temp 98.5°F | Ht 70.0 in | Wt 216.6 lb

## 2017-04-02 DIAGNOSIS — E538 Deficiency of other specified B group vitamins: Secondary | ICD-10-CM | POA: Insufficient documentation

## 2017-04-02 DIAGNOSIS — Z23 Encounter for immunization: Secondary | ICD-10-CM | POA: Diagnosis not present

## 2017-04-02 DIAGNOSIS — Z125 Encounter for screening for malignant neoplasm of prostate: Secondary | ICD-10-CM

## 2017-04-02 DIAGNOSIS — E785 Hyperlipidemia, unspecified: Secondary | ICD-10-CM | POA: Diagnosis not present

## 2017-04-02 DIAGNOSIS — J449 Chronic obstructive pulmonary disease, unspecified: Secondary | ICD-10-CM | POA: Diagnosis not present

## 2017-04-02 LAB — COMPREHENSIVE METABOLIC PANEL
ALBUMIN: 4.4 g/dL (ref 3.5–5.2)
ALK PHOS: 56 U/L (ref 39–117)
ALT: 16 U/L (ref 0–53)
AST: 15 U/L (ref 0–37)
BILIRUBIN TOTAL: 0.7 mg/dL (ref 0.2–1.2)
BUN: 11 mg/dL (ref 6–23)
CO2: 30 mEq/L (ref 19–32)
CREATININE: 0.98 mg/dL (ref 0.40–1.50)
Calcium: 9.2 mg/dL (ref 8.4–10.5)
Chloride: 102 mEq/L (ref 96–112)
GFR: 82.55 mL/min (ref >60.00)
Glucose, Bld: 87 mg/dL (ref 70–99)
Potassium: 4.4 mEq/L (ref 3.5–5.1)
SODIUM: 142 meq/L (ref 135–145)
TOTAL PROTEIN: 6.7 g/dL (ref 6.0–8.3)

## 2017-04-02 LAB — CBC
HEMATOCRIT: 47.7 % (ref 39.0–52.0)
Hemoglobin: 16.1 g/dL (ref 13.0–17.0)
MCHC: 33.8 g/dL (ref 30.0–36.0)
MCV: 98.3 fl (ref 78.0–100.0)
Platelets: 178 10*3/uL (ref 150.0–400.0)
RBC: 4.86 Mil/uL (ref 4.22–5.81)
RDW: 13.3 % (ref 11.5–15.5)
WBC: 7.5 10*3/uL (ref 4.0–10.5)

## 2017-04-02 LAB — LIPID PANEL
Cholesterol: 142 mg/dL (ref 0–200)
HDL: 40.5 mg/dL (ref >39.00)
LDL Cholesterol: 70 mg/dL (ref 0–99)
NonHDL: 101.38
Total CHOL/HDL Ratio: 4
Triglycerides: 158 mg/dL — ABNORMAL HIGH (ref 0.0–149.0)
VLDL: 31.6 mg/dL (ref 0.0–40.0)

## 2017-04-02 LAB — VITAMIN B12: Vitamin B-12: 399 pg/mL (ref 211–911)

## 2017-04-02 LAB — PSA: PSA: 1.37 ng/mL (ref 0.10–4.00)

## 2017-04-02 NOTE — Assessment & Plan Note (Signed)
Improved with current rxs. No changes made today. 

## 2017-04-02 NOTE — Assessment & Plan Note (Signed)
Has been taking OTC B12 supplements. Check Vit B12 level today.

## 2017-04-02 NOTE — Patient Instructions (Signed)
Great to see you.  Happy New Year.  We will call you with your lab results and you can view them online.

## 2017-04-02 NOTE — Progress Notes (Signed)
Subjective:   Patient ID: Hayden Deleon, male    DOB: [birth year], 62 y.o.   MRN: [id number]  Dywane Peruski is a pleasant 62 y.o. year old male who presents to clinic today with Follow-up (Patient is here today to F/U with last labs.  He states that he has been taking OTC Vit-B12 1k 1qd since then.  Is requesting to repeat CBC, CMP, Lipid, PSA, and B-12. He is currently fasting.  Also states that he had a CT done and the Contrast that was given caused BLE Edema to the point that he was unable to walk.)  on 04/02/2017  HPI:  Breathing has been much better on current COPD meds.  Feels less tired on Vit B12- would like Vit B12 levels and other lab work checked today.  Still gets chest pain- Dr. Fletcher Anon has placed him on Imdur but he has not yet started this.  Current Outpatient Medications on File Prior to Visit  Medication Sig Dispense Refill  . aspirin 81 MG tablet Take 81 mg by mouth daily.      [name] atorvastatin (LIPITOR) 40 MG tablet Take 1 tablet (40 mg total) by mouth daily. 90 tablet 1  . Multiple Vitamin (MULTIVITAMIN) capsule Take 1 capsule by mouth daily.      . sertraline (ZOLOFT) 50 MG tablet Take 1 tablet (50 mg total) by mouth daily. 30 tablet 4  . TRELEGY ELLIPTA 100-62.5-25 MCG/INH AEPB USE 1 PUFF DAILY AS DIRECTED  10  . VENTOLIN HFA 108 (90 Base) MCG/ACT inhaler INHALE TWO PUFFS EVERY 6 HOURS AS NEEDEDFOR WHEEZING 18 g 1  . vitamin B-12 (CYANOCOBALAMIN) 1000 MCG tablet Take 1,000 mcg by mouth daily.    . isosorbide mononitrate (IMDUR) 30 MG 24 hr tablet Take 1 tablet (30 mg total) daily by mouth. (Patient not taking: Reported on 04/02/2017) 90 tablet 3  . metoprolol tartrate (LOPRESSOR) 50 MG tablet Take 1 tablet (50 mg total) by mouth 2 (two) times daily. 60 tablet 6   No current facility-administered medications on file prior to visit.     Allergies  Allergen Reactions  . Avelox [Moxifloxacin Hcl In Nacl] Other (See Comments)    Facial swelling  .  Sulfamethoxazole-Trimethoprim Other (See Comments)    REACTION: sore mouth  . Aspirin Other (See Comments)    REACTION: Nausea; fine with 81mg     Past Medical History:  Diagnosis Date  . Allergy   . Anxiety   . Arthritis    right shoulder  . CAD (coronary artery disease)    Catheterization, 2003, occluded OM1, good collaterals  //   nuclear, August, 2009, low risk, EF 60%  . Carotid artery disease ([place])    Doppler, February, 2012, 2-63% R. ICA, 78-58% LICA,  plan followup 1 year  . COPD (chronic obstructive pulmonary disease) (HCC)    Significant Dr. Lamonte Sakai  . Diverticulosis of colon   . Ejection fraction    EF 60%, nuclear, 2009  . GERD (gastroesophageal reflux disease)   . Hyperlipidemia   . Hypertension   . Prostatitis   . Sleep apnea    does not use CPAP    Past Surgical History:  Procedure Laterality Date  . COLONOSCOPY  12/2003   patterson - polyp  . deviated septum    . KNEE ARTHROSCOPY     right  . WISDOM TOOTH EXTRACTION      Family History  Problem Relation Age of Onset  . Angina Mother  car accident  . Asthma Father   . Crohn's disease Sister   . Colon cancer Neg Hx   . Esophageal cancer Neg Hx   . Rectal cancer Neg Hx   . Stomach cancer Neg Hx     Social History   Socioeconomic History  . Marital status: Married    Spouse name: Not on file  . Number of children: 0  . Years of education: Not on file  . Highest education level: Not on file  Social Needs  . Financial resource strain: Not on file  . Food insecurity - worry: Not on file  . Food insecurity - inability: Not on file  . Transportation needs - medical: Not on file  . Transportation needs - non-medical: Not on file  Occupational History  . Occupation: Freight forwarder at [employer]    Employer: [employer]  Tobacco Use  . Smoking status: Former Smoker    Packs/day: 2.00    Years: 32.00    Pack years: 64.00    Types: Cigarettes    Last attempt to quit: 08/31/1999      Years since quitting: 17.6  . Smokeless tobacco: Former Network engineer and Sexual Activity  . Alcohol use: No    Alcohol/week: 0.0 oz  . Drug use: No  . Sexual activity: Not on file  Other Topics Concern  . Not on file  Social History Narrative  . Not on file   The PMH, PSH, Social History, Family History, Medications, and allergies have been reviewed in [facility], and have been updated if relevant.   Review of Systems  Constitutional: Negative.   Eyes: Negative.   Respiratory: Negative.   Cardiovascular: Positive for chest pain. Negative for palpitations and leg swelling.  Gastrointestinal: Negative.   Endocrine: Negative.   Genitourinary: Negative.   Musculoskeletal: Negative.   Allergic/Immunologic: Negative.   Neurological: Negative.   Hematological: Negative.   Psychiatric/Behavioral: Negative.   All other systems reviewed and are negative.      Objective:    BP 140/78 (BP Location: Left Arm, Patient Position: Sitting, Cuff Size: Normal)   Pulse (!) 57   Temp 98.5 F (36.9 C) (Oral)   Ht 5\' 10"  (1.778 m)   Wt 216 lb 9.6 oz (98.2 kg)   SpO2 95%   BMI 31.08 kg/m    Physical Exam  Constitutional: He is oriented to person, place, and time. He appears well-developed and well-nourished. No distress.  HENT:  Head: Normocephalic and atraumatic.  Eyes: Conjunctivae are normal.  Cardiovascular: Normal rate and regular rhythm.  Pulmonary/Chest: Effort normal and breath sounds normal.  Musculoskeletal: Normal range of motion.  Neurological: He is alert and oriented to person, place, and time. No cranial nerve deficit.  Skin: Skin is warm and dry. He is not diaphoretic.  Psychiatric: He has a normal mood and affect. His behavior is normal. Judgment and thought content normal.  Nursing note and vitals reviewed.         Assessment & Plan:   Hyperlipidemia, unspecified hyperlipidemia type - Plan: Comprehensive metabolic panel, Lipid panel  Vitamin B12 deficiency  - Plan: B12, CBC  Screening for malignant neoplasm of prostate - Plan: PSA  Need for 23-polyvalent pneumococcal polysaccharide vaccine - Plan: Pneumococcal polysaccharide vaccine 23-valent greater than or equal to 2yo subcutaneous/IM No Follow-up on file.

## 2017-04-02 NOTE — Assessment & Plan Note (Signed)
Continue statin.  Lipid panel today. 

## 2017-04-09 ENCOUNTER — Other Ambulatory Visit: Payer: Self-pay | Admitting: Family Medicine

## 2017-04-18 ENCOUNTER — Other Ambulatory Visit: Payer: Self-pay | Admitting: *Deleted

## 2017-04-18 ENCOUNTER — Telehealth: Payer: Self-pay

## 2017-04-18 DIAGNOSIS — I6523 Occlusion and stenosis of bilateral carotid arteries: Secondary | ICD-10-CM

## 2017-04-18 NOTE — Telephone Encounter (Signed)
-----   Message from Hayden Filbert, RN sent at 04/18/2017  8:22 AM EST ----- Patient due now for carotid duplex- please call to schedule next available

## 2017-04-18 NOTE — Telephone Encounter (Signed)
l mom to schedule carotid

## 2017-04-30 ENCOUNTER — Encounter: Payer: Self-pay | Admitting: Cardiovascular Disease

## 2017-05-20 NOTE — Telephone Encounter (Signed)
Scheduled 2/28

## 2017-06-05 ENCOUNTER — Ambulatory Visit: Payer: Self-pay | Admitting: *Deleted

## 2017-06-05 NOTE — Telephone Encounter (Signed)
Pt   Has   Symptoms  Of  Runny  Nose   Several  Days  Ago   -  Pt had  Some  Shortness  Of breath  This  Am  Which  Was  relieved  By  Inhaler .  Pt has a  Non productine  Cough   History  Of  COPD  In  Past . No availability  With Dr   Deborra Medina   appt made  For tommorow  With  Clearance Coots   Reason for Disposition . [1] Patient also has allergy symptoms (e.g., itchy eyes, clear nasal discharge, postnasal drip) AND [2] they are acting up    Pt has  Copd    Had   Shortness  Of  Breath  This  Am  relieved  By  Inhaler  Answer Assessment - Initial Assessment Questions 1. ONSET: "When did the cough begin?"      Yest    2. SEVERITY: "How bad is the cough today?"        Mild    3. RESPIRATORY DISTRESS: "Describe your breathing."        Was  Labored   This  Am   Better  After  Inhaler     Symptoms  Mild  At this  Time   4. FEVER: "Do you have a fever?" If so, ask: "What is your temperature, how was it measured, and when did it start?"       No 5. HEMOPTYSIS: "Are you coughing up any blood?" If so ask: "How much?" (flecks, streaks, tablespoons, etc.)      no 6. TREATMENT: "What have you done so far to treat the cough?" (e.g., meds, fluids, humidifier)     Inhaler   Fluids  Humidifier    7. CARDIAC HISTORY: "Do you have any history of heart disease?" (e.g., heart attack, congestive heart failure)        CAD   8. LUNG HISTORY: "Do you have any history of lung disease?"  (e.g., pulmonary embolus, asthma, emphysema)       COPD   9. PE RISK FACTORS: "Do you have a history of blood clots?" (or: recent major surgery, recent prolonged travel, bedridden )        NO 10. OTHER SYMPTOMS: "Do you have any other symptoms? (e.g., runny nose, wheezing, chest pain)       Two days  Ago  Had  Runny  Nose    11. PREGNANCY: "Is there any chance you are pregnant?" "When was your last menstrual period?"       N/A 12. TRAVEL: "Have you traveled out of the country in the last month?" (e.g., travel history, exposures)        No  Protocols used: COUGH - ACUTE NON-PRODUCTIVE-A-AH

## 2017-06-06 ENCOUNTER — Ambulatory Visit (INDEPENDENT_AMBULATORY_CARE_PROVIDER_SITE_OTHER): Payer: BLUE CROSS/BLUE SHIELD | Admitting: Family Medicine

## 2017-06-06 ENCOUNTER — Other Ambulatory Visit: Payer: Self-pay | Admitting: Family Medicine

## 2017-06-06 ENCOUNTER — Encounter: Payer: Self-pay | Admitting: Family Medicine

## 2017-06-06 ENCOUNTER — Ambulatory Visit (INDEPENDENT_AMBULATORY_CARE_PROVIDER_SITE_OTHER): Payer: BLUE CROSS/BLUE SHIELD

## 2017-06-06 VITALS — BP 136/64 | HR 86 | Temp 98.6°F | Ht 70.0 in | Wt 227.0 lb

## 2017-06-06 DIAGNOSIS — I6523 Occlusion and stenosis of bilateral carotid arteries: Secondary | ICD-10-CM

## 2017-06-06 DIAGNOSIS — R059 Cough, unspecified: Secondary | ICD-10-CM | POA: Insufficient documentation

## 2017-06-06 DIAGNOSIS — R05 Cough: Secondary | ICD-10-CM

## 2017-06-06 MED ORDER — PREDNISONE 50 MG PO TABS: 50.0000 mg | ORAL_TABLET | Freq: Every day | ORAL | 0 refills | Status: DC

## 2017-06-06 NOTE — Assessment & Plan Note (Signed)
Has mild wheezing on exam. Doesn't appear to be a full blown COPD exacerbation  - prednisone for 5 days  - advised to call back if no improvement on Monday and can try a z pack and/or chest xray

## 2017-06-06 NOTE — Patient Instructions (Signed)
Please let me know if you haven't had any improvement on Monday and we may need to try a course of antibiotics.  Vick's, Honey and Delsym can help with cough

## 2017-06-06 NOTE — Progress Notes (Signed)
Hayden Deleon - 62 y.o. male MRN [id number]  Date of birth: [birth year]  SUBJECTIVE:  Including CC & ROS.  Chief Complaint  Patient presents with  . Cough    Hayden Deleon is a 62 y.o. male that is presenting with a cough. Ongoing for two day. Denies fevers, chills or body aches. He has not taken anything for his symptoms. Denies mucous production. He has been around coworkers with similar symptoms. Denies shortness of breath.   Review of his CT chest from 2018 shows extensive changes of COPD with a lower lobe predominance.  Depression: well controlled with zoloft. Needs a refill. Denies any SI or HI   HTN: needs a refill of metoprolol.    Review of Systems  Constitutional: Negative for fever.  Respiratory: Positive for cough.   Cardiovascular: Negative for chest pain.  Gastrointestinal: Negative for abdominal pain.  Neurological: Negative for weakness.  Hematological: Negative for adenopathy.  Psychiatric/Behavioral: Negative for agitation.    HISTORY: Past Medical, Surgical, Social, and Family History Reviewed & Updated per EMR.   Pertinent Historical Findings include:  Past Medical History:  Diagnosis Date  . Allergy   . Anxiety   . Arthritis    right shoulder  . CAD (coronary artery disease)    Catheterization, 2003, occluded OM1, good collaterals  //   nuclear, August, 2009, low risk, EF 60%  . Carotid artery disease ([place])    Doppler, February, 2012, 5-36% R. ICA, 14-43% LICA,  plan followup 1 year  . COPD (chronic obstructive pulmonary disease) (HCC)    Significant Dr. Lamonte Sakai  . Diverticulosis of colon   . Ejection fraction    EF 60%, nuclear, 2009  . GERD (gastroesophageal reflux disease)   . Hyperlipidemia   . Hypertension   . Prostatitis   . Sleep apnea    does not use CPAP    Past Surgical History:  Procedure Laterality Date  . COLONOSCOPY  12/2003   patterson - polyp  . deviated septum    . KNEE ARTHROSCOPY     right  . WISDOM TOOTH  EXTRACTION      Allergies  Allergen Reactions  . Avelox [Moxifloxacin Hcl In Nacl] Other (See Comments)    Facial swelling  . Sulfamethoxazole-Trimethoprim Other (See Comments)    REACTION: sore mouth  . Aspirin Other (See Comments)    REACTION: Nausea; fine with 81mg     Family History  Problem Relation Age of Onset  . Angina Mother        car accident  . Asthma Father   . Crohn's disease Sister   . Colon cancer Neg Hx   . Esophageal cancer Neg Hx   . Rectal cancer Neg Hx   . Stomach cancer Neg Hx      Social History   Socioeconomic History  . Marital status: Married    Spouse name: Not on file  . Number of children: 0  . Years of education: Not on file  . Highest education level: Not on file  Social Needs  . Financial resource strain: Not on file  . Food insecurity - worry: Not on file  . Food insecurity - inability: Not on file  . Transportation needs - medical: Not on file  . Transportation needs - non-medical: Not on file  Occupational History  . Occupation: Freight forwarder at [employer]    Employer: [employer]  Tobacco Use  . Smoking status: Former Smoker    Packs/day: 2.00  Years: 32.00    Pack years: 64.00    Types: Cigarettes    Last attempt to quit: 08/31/1999    Years since quitting: 17.7  . Smokeless tobacco: Former Network engineer and Sexual Activity  . Alcohol use: No    Alcohol/week: 0.0 oz  . Drug use: No  . Sexual activity: Not on file  Other Topics Concern  . Not on file  Social History Narrative  . Not on file     PHYSICAL EXAM:  VS: BP 136/64 (BP Location: Left Arm, Patient Position: Sitting, Cuff Size: Normal)   Pulse 86   Temp 98.6 F (37 C) (Oral)   Ht 5\' 10"  (1.778 m)   Wt 227 lb (103 kg)   SpO2 93%   BMI 32.57 kg/m  Physical Exam Gen: NAD, alert, cooperative with exam, well-appearing ENT: normal lips, normal nasal mucosa,  Eye: normal EOM, normal conjunctiva and lids CV:  no edema, +2 pedal pulses, RRR    Resp: no accessory muscle use, non-labored, slight end expiratory wheezing  Skin: no rashes, no areas of induration  Neuro: normal tone, normal sensation to touch Psych:  normal insight, alert and oriented MSK: normal gait, normal strength      ASSESSMENT & PLAN:   Cough Has mild wheezing on exam. Doesn't appear to be a full blown COPD exacerbation  - prednisone for 5 days  - advised to call back if no improvement on Monday and can try a z pack and/or chest xray

## 2017-06-09 ENCOUNTER — Other Ambulatory Visit: Payer: Self-pay | Admitting: *Deleted

## 2017-06-09 DIAGNOSIS — I6529 Occlusion and stenosis of unspecified carotid artery: Secondary | ICD-10-CM

## 2017-07-09 ENCOUNTER — Ambulatory Visit: Payer: Self-pay | Admitting: *Deleted

## 2017-07-09 NOTE — Telephone Encounter (Signed)
Pt  Reports the symptoms of dizziness he has had for 3 weeks. He is  Awake  And  Alert  Denies any  Headache or neuro deficit .Appt  Made for tomorrow  Dr Deborra Medina   Precautions  Given to  Patient   Reason for Disposition . [1] MILD dizziness (e.g., vertigo; walking normally) AND [2] has NOT been evaluated by physician for this  Answer Assessment - Initial Assessment Questions 1. DESCRIPTION: "Describe your dizziness."       After  Sitting and  Standing  Gets   Swimmy  Headed  approx  30 secs   2. VERTIGO: "Do you feel like either you or the room is spinning or tilting?"          No 3. LIGHTHEADED: "Do you feel lightheaded?" (e.g., somewhat faint, woozy, weak upon standing)       Swimmy headed   4. SEVERITY: "How bad is it?"  "Can you walk?"   - MILD - Feels unsteady but walking normally.   - MODERATE - Feels very unsteady when walking, but not falling; interferes with normal activities (e.g., school, work) .   - SEVERE - Unable to walk without falling (requires assistance).     Mild - Moderate      5. ONSET:  "When did the dizziness begin?"       3   Weeks   6. AGGRAVATING FACTORS: "Does anything make it worse?" (e.g., standing, change in head position)     Changes  In  Head position           7. CAUSE: "What do you think is causing the dizziness?"        No  Clue   8. RECURRENT SYMPTOM: "Have you had dizziness before?" If so, ask: "When was the last time?" "What happened that time?"      No 9. OTHER SYMPTOMS: "Do you have any other symptoms?" (e.g., headache, weakness, numbness, vomiting, earache)      No 10. PREGNANCY: "Is there any chance you are pregnant?" "When was your last menstrual period?"        N/a  Protocols used: DIZZINESS - VERTIGO-A-AH

## 2017-07-10 ENCOUNTER — Encounter: Payer: Self-pay | Admitting: Family Medicine

## 2017-07-10 ENCOUNTER — Ambulatory Visit (INDEPENDENT_AMBULATORY_CARE_PROVIDER_SITE_OTHER): Payer: BLUE CROSS/BLUE SHIELD | Admitting: Family Medicine

## 2017-07-10 VITALS — BP 126/74 | HR 53 | Temp 98.7°F | Ht 70.0 in | Wt 228.4 lb

## 2017-07-10 DIAGNOSIS — H8112 Benign paroxysmal vertigo, left ear: Secondary | ICD-10-CM | POA: Diagnosis not present

## 2017-07-10 DIAGNOSIS — R42 Dizziness and giddiness: Secondary | ICD-10-CM | POA: Insufficient documentation

## 2017-07-10 MED ORDER — MECLIZINE HCL 12.5 MG PO TABS: 12.5000 mg | ORAL_TABLET | Freq: Three times a day (TID) | ORAL | 0 refills | Status: DC | PRN

## 2017-07-10 NOTE — Progress Notes (Signed)
Subjective:   Patient ID: Hayden Deleon, male    DOB: 02/06/1956, 62 y.o.   MRN: [id number]  Hayden Deleon is a pleasant 62 y.o. year old male who presents to clinic today with Dizziness (Patient is here today C/O dizziness. Dizziness started 2.5-3weeks ago.  Seeming to get better but not gone.  The room spins when he lays down and turns his head to the right.  When he has been sitting a while then stands he feels slightly dizzy.  When he turns his head too fast he gets dizzy.  Does not have an ENT.)  on 07/10/2017  HPI:  Dizziness- started 2.5 - 3 weeks ago. Seems to be getting better but has not completely resolved.  Room spins when he lays down and turns his head to the right.  He also feels dizzy when he stands for a  Long time or turns his head too fast.  PMH significant for COPD and CAD. No recent URI. NO headache or other neurological symptoms.  No nausea or vomiting.  Current Outpatient Medications on File Prior to Visit  Medication Sig Dispense Refill  . aspirin 81 MG tablet Take 81 mg by mouth daily.      [name] atorvastatin (LIPITOR) 40 MG tablet Take 1 tablet (40 mg total) by mouth daily. 90 tablet 1  . Multiple Vitamin (MULTIVITAMIN) capsule Take 1 capsule by mouth daily.      . sertraline (ZOLOFT) 50 MG tablet TAKE 1 TABLET BY MOUTH DAILY 30 tablet 2  . TRELEGY ELLIPTA 100-62.5-25 MCG/INH AEPB USE 1 PUFF DAILY AS DIRECTED  10  . VENTOLIN HFA 108 (90 Base) MCG/ACT inhaler INHALE 2 PUFFS INTO THE LUNGS EVERY 6 HOURS AS NEEDED FOR WHEEZING 18 g 1  . vitamin B-12 (CYANOCOBALAMIN) 1000 MCG tablet Take 1,000 mcg by mouth daily.    . metoprolol tartrate (LOPRESSOR) 50 MG tablet Take 1 tablet (50 mg total) by mouth 2 (two) times daily. 60 tablet 6   No current facility-administered medications on file prior to visit.     Allergies  Allergen Reactions  . Avelox [Moxifloxacin Hcl In Nacl] Other (See Comments)    Facial swelling  . Sulfamethoxazole-Trimethoprim Other (See  Comments)    REACTION: sore mouth  . Aspirin Other (See Comments)    REACTION: Nausea; fine with 81mg     Past Medical History:  Diagnosis Date  . Allergy   . Anxiety   . Arthritis    right shoulder  . CAD (coronary artery disease)    Catheterization, 2003, occluded OM1, good collaterals  //   nuclear, August, 2009, low risk, EF 60%  . Carotid artery disease ([place])    Doppler, February, 2012, 1-66% R. ICA, 06-30% LICA,  plan followup 1 year  . COPD (chronic obstructive pulmonary disease) (HCC)    Significant Dr. Lamonte Sakai  . Diverticulosis of colon   . Ejection fraction    EF 60%, nuclear, 2009  . GERD (gastroesophageal reflux disease)   . Hyperlipidemia   . Hypertension   . Prostatitis   . Sleep apnea    does not use CPAP    Past Surgical History:  Procedure Laterality Date  . COLONOSCOPY  12/2003   patterson - polyp  . deviated septum    . KNEE ARTHROSCOPY     right  . WISDOM TOOTH EXTRACTION      Family History  Problem Relation Age of Onset  . Angina Mother  car accident  . Asthma Father   . Crohn's disease Sister   . Colon cancer Neg Hx   . Esophageal cancer Neg Hx   . Rectal cancer Neg Hx   . Stomach cancer Neg Hx     Social History   Socioeconomic History  . Marital status: Married    Spouse name: Not on file  . Number of children: 0  . Years of education: Not on file  . Highest education level: Not on file  Occupational History  . Occupation: Freight forwarder at [employer]    Employer: [employer]  Social Needs  . Financial resource strain: Not on file  . Food insecurity:    Worry: Not on file    Inability: Not on file  . Transportation needs:    Medical: Not on file    Non-medical: Not on file  Tobacco Use  . Smoking status: Former Smoker    Packs/day: 2.00    Years: 32.00    Pack years: 64.00    Types: Cigarettes    Last attempt to quit: 08/31/1999    Years since quitting: 17.8  . Smokeless tobacco: Former Network engineer  and Sexual Activity  . Alcohol use: No    Alcohol/week: 0.0 oz  . Drug use: No  . Sexual activity: Not on file  Lifestyle  . Physical activity:    Days per week: Not on file    Minutes per session: Not on file  . Stress: Not on file  Relationships  . Social connections:    Talks on phone: Not on file    Gets together: Not on file    Attends religious service: Not on file    Active member of club or organization: Not on file    Attends meetings of clubs or organizations: Not on file    Relationship status: Not on file  . Intimate partner violence:    Fear of current or ex partner: Not on file    Emotionally abused: Not on file    Physically abused: Not on file    Forced sexual activity: Not on file  Other Topics Concern  . Not on file  Social History Narrative  . Not on file   The PMH, PSH, Social History, Family History, Medications, and allergies have been reviewed in [facility], and have been updated if relevant.   Review of Systems  Eyes: Negative.   Respiratory: Negative.   Cardiovascular: Negative.   Gastrointestinal: Negative.   Neurological: Positive for dizziness. Negative for tremors, seizures, syncope, facial asymmetry, speech difficulty, weakness, light-headedness, numbness and headaches.  All other systems reviewed and are negative.      Objective:    BP 126/74 (BP Location: Left Arm, Patient Position: Sitting, Cuff Size: Normal)   Pulse (!) 53   Temp 98.7 F (37.1 C) (Oral)   Ht 5\' 10"  (1.778 m)   Wt 228 lb 6.4 oz (103.6 kg)   SpO2 94%   BMI 32.77 kg/m    Physical Exam  Constitutional: He is oriented to person, place, and time. He appears well-developed and well-nourished. No distress.  HENT:  Head: Normocephalic.  Eyes:  +horizontal nystagmus left  Cardiovascular: Normal rate.  Pulmonary/Chest: Effort normal.  Neurological: He is alert and oriented to person, place, and time. No cranial nerve deficit or sensory deficit. Coordination normal.  +dix  hallpike  Skin: Skin is warm and dry. He is not diaphoretic.  Psychiatric: He has a normal mood and affect. His  behavior is normal. Judgment and thought content normal.  Nursing note and vitals reviewed.         Assessment & Plan:   BPV (benign positional vertigo), left No follow-ups on file.

## 2017-07-10 NOTE — Patient Instructions (Signed)

## 2017-07-10 NOTE — Assessment & Plan Note (Signed)
New- Discussed supportive care- as per AVS. eRx sent for meclizine. Discussed sedation precautions. Call or return to clinic prn if these symptoms worsen or fail to improve as anticipated. The patient indicates understanding of these issues and agrees with the plan.

## 2017-09-05 ENCOUNTER — Other Ambulatory Visit: Payer: Self-pay | Admitting: Family Medicine

## 2017-09-06 ENCOUNTER — Other Ambulatory Visit: Payer: Self-pay | Admitting: Family Medicine

## 2017-09-06 DIAGNOSIS — I779 Disorder of arteries and arterioles, unspecified: Secondary | ICD-10-CM

## 2017-09-06 DIAGNOSIS — I739 Peripheral vascular disease, unspecified: Principal | ICD-10-CM

## 2017-09-06 DIAGNOSIS — I2510 Atherosclerotic heart disease of native coronary artery without angina pectoris: Secondary | ICD-10-CM

## 2017-09-16 ENCOUNTER — Ambulatory Visit (INDEPENDENT_AMBULATORY_CARE_PROVIDER_SITE_OTHER): Payer: BLUE CROSS/BLUE SHIELD | Admitting: Orthopaedic Surgery

## 2017-09-16 ENCOUNTER — Encounter (INDEPENDENT_AMBULATORY_CARE_PROVIDER_SITE_OTHER): Payer: Self-pay | Admitting: Orthopaedic Surgery

## 2017-09-16 ENCOUNTER — Ambulatory Visit (INDEPENDENT_AMBULATORY_CARE_PROVIDER_SITE_OTHER): Payer: BLUE CROSS/BLUE SHIELD

## 2017-09-16 VITALS — Ht 70.0 in | Wt 231.0 lb

## 2017-09-16 DIAGNOSIS — G8929 Other chronic pain: Secondary | ICD-10-CM | POA: Diagnosis not present

## 2017-09-16 DIAGNOSIS — M25511 Pain in right shoulder: Secondary | ICD-10-CM

## 2017-09-16 NOTE — Progress Notes (Signed)
Office Visit Note   Patient: Hayden Deleon           Date of Birth: 13-Jul-1955           MRN: [id number] Visit Date: 09/16/2017              Requested by: Lucille Passy, MD [place], [place] [id number] PCP: Lucille Passy, MD   Assessment & Plan: Visit Diagnoses:  1. Chronic right shoulder pain     Plan: We will refer him to Dr. Marlou Sa for definitive treatment of his right shoulder.  He would like to undergo a right shoulder replacement sometime in the fall or winter.  Shoulder is affecting his activities of daily living and causes him daily pain.  Is been living with this for years therefore due to the severity on radiographs feel that he be a perfect candidate for right shoulder replacement.  Follow-Up Instructions: Return for [facility] up with Dr. Marlou Sa .   Orders:  Orders Placed This Encounter  Procedures  . XR Shoulder Right   No orders of the defined types were placed in this encounter.     Procedures: No procedures performed   Clinical Data: No additional findings.   Subjective: Chief Complaint  Patient presents with  . Right Shoulder - Pain    HPI Hayden Deleon is a 62 year old male comes in today with a right shoulder pain.  He states his had right shoulder pain for many years.  He was told by Dr. Ninfa Linden years ago that he eventually need a replacement shoulder.  He is looking to have a shoulder replacement sometime later this year fall or winter.  He has had no new injury.  States his pain is getting worse today range of motion is decreasing.  He sleeps in a recliner which helps alleviate the pain in his shoulder.  He also notes that he was sleeping in a recliner for breathing purposes prior to getting CPAP and is just continue to sleep in a recliner.  Right-hand-dominant.  Takes no medication for pain.  Shoulder pain does awaken him at night.   Review of Systems Positive for chronic angina and shortness of breath with exertion.  He denies any  fevers chills nausea or vomiting.  No orthopnea.  Objective: Vital Signs: Ht 5\' 10"  (1.778 m)   Wt 231 lb (104.8 kg)   BMI 33.15 kg/m   Physical Exam  Constitutional: He is oriented to person, place, and time. He appears well-developed and well-nourished. No distress.  Pulmonary/Chest: Effort normal.  Neurological: He is alert and oriented to person, place, and time.  Skin: He is not diaphoretic.  Psychiatric: He has a normal mood and affect.    Ortho Exam Right shoulder significant crepitus with any attempts of range of motion.  He has limited forward flexion to 90 degrees actively passively and bring to around 110 degrees.  Abduction active and passive 90 degrees.  Good cuff strength with external and internal rotation against resistance bilaterally.  Empty can test negative bilaterally. Specialty Comments:  No specialty comments available.  Imaging: Xr Shoulder Right  Result Date: 09/16/2017 CLINICAL DATA:  Chronic RIGHT shoulder pain EXAM: RIGHT SHOULDER - 2+ VIEW COMPARISON:  None FINDINGS: Osseous demineralization. AC joint alignment normal. Advanced degenerative changes of the RIGHT glenohumeral joint with joint space narrowing, glenoid remodeling, and bulky spur formation inferiorly. Visualized ribs intact. No acute fracture, dislocation, or bone destruction. IMPRESSION: Osseous demineralization with advanced RIGHT glenohumeral degenerative  changes. Electronically Signed   By: Lavonia Dana M.D.   On: 09/16/2017 08:34     PMFS History: Patient Active Problem List   Diagnosis Date Noted  . BPV (benign positional vertigo), left 07/10/2017  . Vitamin B12 deficiency 04/02/2017  . COPD (chronic obstructive pulmonary disease) ([place]) 10/07/2014  . OSA (obstructive sleep apnea) 05/01/2012  . CAD (coronary artery disease)   . Carotid artery disease ([place])   . GERD (gastroesophageal reflux disease)   . Ejection fraction   . MIXED HYPERLIPIDEMIA 05/03/2010  . BENIGN POSITIONAL  VERTIGO 05/03/2010  . Anxiety state 11/10/2008  . ANGIOEDEMA 08/18/2007  . SYSTOLIC MURMUR 09/22/7626  . DYSPHAGIA UNSPECIFIED 05/22/2007  . PROSTATITIS, HX OF 05/22/2007  . ADVEF, DRUG/MEDICINAL/BIOLOGICAL SUBST NOS 01/26/2007  . COLONIC POLYPS 12/31/2003  . GASTROESOPHAGEAL REFLUX DISEASE, CHRONIC 12/31/2003  . HIATAL HERNIA 12/31/2003  . DIVERTICULOSIS, COLON 12/31/2003  . HLD (hyperlipidemia) 05/02/2001  . HTN (hypertension) 05/02/2001   Past Medical History:  Diagnosis Date  . Allergy   . Anxiety   . Arthritis    right shoulder  . CAD (coronary artery disease)    Catheterization, 2003, occluded OM1, good collaterals  //   nuclear, August, 2009, low risk, EF 60%  . Carotid artery disease ([place])    Doppler, February, 2012, 3-15% R. ICA, 17-61% LICA,  plan followup 1 year  . COPD (chronic obstructive pulmonary disease) (HCC)    Significant Dr. Lamonte Sakai  . Diverticulosis of colon   . Ejection fraction    EF 60%, nuclear, 2009  . GERD (gastroesophageal reflux disease)   . Hyperlipidemia   . Hypertension   . Prostatitis   . Sleep apnea    does not use CPAP    Family History  Problem Relation Age of Onset  . Angina Mother        car accident  . Asthma Father   . Crohn's disease Sister   . Colon cancer Neg Hx   . Esophageal cancer Neg Hx   . Rectal cancer Neg Hx   . Stomach cancer Neg Hx     Past Surgical History:  Procedure Laterality Date  . COLONOSCOPY  12/2003   patterson - polyp  . deviated septum    . KNEE ARTHROSCOPY     right  . WISDOM TOOTH EXTRACTION     Social History   Occupational History  . Occupation: Freight forwarder at [employer]    Employer: [employer]  Tobacco Use  . Smoking status: Former Smoker    Packs/day: 2.00    Years: 32.00    Pack years: 64.00    Types: Cigarettes    Last attempt to quit: 08/31/1999    Years since quitting: 18.0  . Smokeless tobacco: Former Network engineer and Sexual Activity  . Alcohol use: No     Alcohol/week: 0.0 oz  . Drug use: No  . Sexual activity: Not on file

## 2017-09-24 NOTE — Progress Notes (Signed)
[facility] Consultation      Assessment and Plan:  The patient is a 62 year old male with severe COPD and obstructive sleep apnea.  COPD with chronic dyspnea on exertion and yearly exacerbations. Elevated eosiniphil count of 300, up to 600 with exacerbations.  -Discussed management of COPD with patient.  -Encouraged to increase or maintain physical activity level. -Continue Trelegy inhaler  --Pneumovax administered 2016; can administer Prevnar when age 47.  --Alpha-1 normal.    Sleep apnea.  -Continue using CPAP.   Chronic left basal pleural scarring, with mild left mediastinal shift, as seen on CT chest. These changes appear chronic. -Changes appear chronic, no routine follow-up imaging needed.   Date: 09/24/2017  MRN# [id number] Hayden Deleon January 11, 1956    Hayden Deleon is a 62 y.o. old male seen in consultation for chief complaint of:    Chief Complaint  Patient presents with  . COPD    controlled on Trelegy  . Sleep Apnea    pt states therapy going well. He does not have a SD card in machine.    HPI:   62 year old ex-smoker presents for FU of obstructive sleep apnea & COPD. last visit, it was noted that the patient had some dyspnea, and eosinophils were elevated. Therefore, he was switched from Spiriva to Trelegy inhaler.   He is doing well with trelegy inhaler. He has dyspnea on moderate exertion, but generally feel limited. If he works for long periods in the yard, he has to stop and rest.  He is using CPAP every night and feels that he is doing well with it. He sleeps in a recliner and uses his cpap there.   **Pneumovax administered 12/30/16  **PFT 12/24/16; Spirometry: FVC was 62% of predicted, FEV1 was 33% of predicted, ratio is 43%. Flow volume loop is consistent with obstruction. There is significant improvement with bronchodilator therapy. Lung volumes: TLC is 78% of predicted, RV to TLC ratio was normal. DLCO is 49% of  predicted. -Overall this test is consistent with severe COPD with reversibility, there may also be an element of mild restrictive lung disease.  **CBC 08/22/16; Eos=600 during an episode of AECOPD. At baseline he is at 300.  **Alpha-1 09/30/16>> normal.  **CT chest 09/06/16 shows severe diffuse panlobular emphysema, with some left basilar scarring, left pleural thickening, small effusion with leftward mediastinal shift.. **Sleep study 05/30/12>>AHI 35  12/28/2012 PSG showed -AHI 28 times/h  He did not sleep much on CPAP - start CPAP 7cm , pillows, humidity,  Download 10/28/12 on 7 cm - AHI 5/h, good usage, no leak    Allergies:  Avelox [moxifloxacin hcl in nacl]; Sulfamethoxazole-trimethoprim; and Aspirin  Review of Systems: Gen:  Denies  fever, sweats, chills HEENT: Denies blurred vision, double vision. bleeds, sore throat Cvc:  No dizziness, chest pain. Resp:   Denies cough or sputum production, shortness of breath Gi: Denies swallowing difficulty, stomach pain. Gu:  Denies bladder incontinence, burning urine Ext:   No Joint pain, stiffness. Skin: No skin rash,  hives  Endoc:  No polyuria, polydipsia. Psych: No depression, insomnia. Other:  All other systems were reviewed with the patient and were negative other that what is mentioned in the HPI.   Physical Examination:   VS: BP 100/64 (BP Location: Left Arm, Cuff Size: Large)   Pulse (!) 50   Resp 16   Ht 5\' 10"  (1.778 m)   Wt 227 lb (103 kg)   SpO2 93%   BMI 32.57 kg/m  General Appearance: No distress  Neuro:without focal findings,  speech normal,  HEENT: PERRLA, EOM intact.   Pulmonary: normal breath sounds, No wheezing, decreased air entry bilaterally. CardiovascularNormal S1,S2.  No m/r/g.   Abdomen: Benign, Soft, non-tender. Renal:  No costovertebral tenderness  GU:  No performed at this time. Endoc: No evident thyromegaly, no signs of acromegaly. Skin:   warm, no rashes, no ecchymosis  Extremities: normal, no  cyanosis, clubbing.  Other findings:    LABORATORY PANEL:   CBC No results for input(s): WBC, HGB, HCT, PLT in the last 168 hours. ------------------------------------------------------------------------------------------------------------------  Chemistries  No results for input(s): NA, K, CL, CO2, GLUCOSE, BUN, CREATININE, CALCIUM, MG, AST, ALT, ALKPHOS, BILITOT in the last 168 hours.  Invalid input(s): GFRCGP ------------------------------------------------------------------------------------------------------------------  Cardiac Enzymes No results for input(s): TROPONINI in the last 168 hours. ------------------------------------------------------------  RADIOLOGY:  No results found.     Thank  you for the consultation and for allowing [facility] to assist in the care of your patient. Our recommendations are noted above.  Please contact us if we can be of further service.   Marda Stalker, MD.  Board Certified in Internal Medicine, Pulmonary Medicine, [place], and Sleep Medicine.  [facility] Office Number: [phone]  Patricia Pesa, M.D.  Merton Border, M.D  09/24/2017

## 2017-09-26 ENCOUNTER — Ambulatory Visit (INDEPENDENT_AMBULATORY_CARE_PROVIDER_SITE_OTHER): Payer: BLUE CROSS/BLUE SHIELD | Admitting: Internal Medicine

## 2017-09-26 ENCOUNTER — Encounter: Payer: Self-pay | Admitting: Internal Medicine

## 2017-09-26 VITALS — BP 100/64 | HR 50 | Resp 16 | Ht 70.0 in | Wt 227.0 lb

## 2017-09-26 DIAGNOSIS — G4733 Obstructive sleep apnea (adult) (pediatric): Secondary | ICD-10-CM | POA: Diagnosis not present

## 2017-09-26 DIAGNOSIS — J449 Chronic obstructive pulmonary disease, unspecified: Secondary | ICD-10-CM | POA: Diagnosis not present

## 2017-09-26 NOTE — Patient Instructions (Signed)
Continue using your inhaler and your cpap every night.

## 2017-10-02 ENCOUNTER — Other Ambulatory Visit: Payer: Self-pay | Admitting: Internal Medicine

## 2017-10-27 ENCOUNTER — Encounter: Payer: Self-pay | Admitting: Family Medicine

## 2017-10-27 ENCOUNTER — Ambulatory Visit (INDEPENDENT_AMBULATORY_CARE_PROVIDER_SITE_OTHER): Payer: BLUE CROSS/BLUE SHIELD | Admitting: Family Medicine

## 2017-10-27 ENCOUNTER — Other Ambulatory Visit: Payer: Self-pay

## 2017-10-27 VITALS — BP 160/82 | HR 62 | Temp 98.5°F | Ht 70.0 in | Wt 232.4 lb

## 2017-10-27 DIAGNOSIS — R21 Rash and other nonspecific skin eruption: Secondary | ICD-10-CM | POA: Diagnosis not present

## 2017-10-27 MED ORDER — ALBUTEROL SULFATE HFA 108 (90 BASE) MCG/ACT IN AERS: INHALATION_SPRAY | RESPIRATORY_TRACT | 2 refills | Status: DC

## 2017-10-27 MED ORDER — TRIAMCINOLONE ACETONIDE 0.1 % EX CREA: 1.0000 "application " | TOPICAL_CREAM | Freq: Two times a day (BID) | CUTANEOUS | 0 refills | Status: DC

## 2017-10-27 MED ORDER — PREDNISONE 10 MG PO TABS: ORAL_TABLET | ORAL | 0 refills | Status: DC

## 2017-10-27 NOTE — Progress Notes (Signed)
Subjective:   Patient ID: Hayden Deleon, male    DOB: 08-Apr-1955, 62 y.o.   MRN: [id number]  Hayden Deleon is a pleasant 62 y.o. year old male who presents to clinic today with Rash (Patient is here today C/O a rash.  He states that it presented 62-month-ago on his right side of ribs.  He thought it was a bug bite at first and is now spreading.  It itches and burns when he scratches it.  He has not Tx with any OTC meds.)  on 10/27/2017  HPI:  Rash- First noticed it on the right side of his ribcage approximately one month ago.  He thought it was a bug bite at first but now it's spreading.  Itches and burns when he scratches.  Has not tried any OTC rx for it.  No new soaps, detergents that he is aware of.  No changes in rx. Current Outpatient Medications on File Prior to Visit  Medication Sig Dispense Refill  . aspirin 81 MG tablet Take 81 mg by mouth daily.      [name] atorvastatin (LIPITOR) 40 MG tablet Take 1 tablet (40 mg total) by mouth daily. 90 tablet 1  . metoprolol tartrate (LOPRESSOR) 50 MG tablet TAKE 1 TABLET BY MOUTH TWICE A DAY 60 tablet 5  . Multiple Vitamin (MULTIVITAMIN) capsule Take 1 capsule by mouth daily.      . sertraline (ZOLOFT) 50 MG tablet TAKE 1 TABLET BY MOUTH EVERY DAY 30 tablet 2  . TRELEGY ELLIPTA 100-62.5-25 MCG/INH AEPB USE 1 PUFF DAILY AS DIRECTED 60 each 5  . vitamin B-12 (CYANOCOBALAMIN) 1000 MCG tablet Take 1,000 mcg by mouth daily.     No current facility-administered medications on file prior to visit.     Allergies  Allergen Reactions  . Avelox [Moxifloxacin Hcl In Nacl] Other (See Comments)    Facial swelling  . Sulfamethoxazole-Trimethoprim Other (See Comments)    REACTION: sore mouth  . Aspirin Other (See Comments)    REACTION: Nausea; fine with 81mg     Past Medical History:  Diagnosis Date  . Allergy   . Anxiety   . Arthritis    right shoulder  . CAD (coronary artery disease)    Catheterization, 2003, occluded OM1, good  collaterals  //   nuclear, August, 2009, low risk, EF 60%  . Carotid artery disease ([place])    Doppler, February, 2012, 3-15% R. ICA, 17-61% LICA,  plan followup 1 year  . COPD (chronic obstructive pulmonary disease) ([facility])    Significant Dr. Lamonte Sakai  . Diverticulosis of colon   . Ejection fraction    EF 60%, nuclear, 2009  . GERD (gastroesophageal reflux disease)   . Hyperlipidemia   . Hypertension   . Prostatitis   . Sleep apnea    does not use CPAP    Past Surgical History:  Procedure Laterality Date  . COLONOSCOPY  12/2003   patterson - polyp  . deviated septum    . KNEE ARTHROSCOPY     right  . WISDOM TOOTH EXTRACTION      Family History  Problem Relation Age of Onset  . Angina Mother        car accident  . Asthma Father   . Crohn's disease Sister   . Colon cancer Neg Hx   . Esophageal cancer Neg Hx   . Rectal cancer Neg Hx   . Stomach cancer Neg Hx     Social History   Socioeconomic History  .  Marital status: Married    Spouse name: Not on file  . Number of children: 0  . Years of education: Not on file  . Highest education level: Not on file  Occupational History  . Occupation: Freight forwarder at [employer]    Employer: [employer]  Social Needs  . Financial resource strain: Not on file  . Food insecurity:    Worry: Not on file    Inability: Not on file  . Transportation needs:    Medical: Not on file    Non-medical: Not on file  Tobacco Use  . Smoking status: Former Smoker    Packs/day: 2.00    Years: 32.00    Pack years: 64.00    Types: Cigarettes    Last attempt to quit: 08/31/1999    Years since quitting: 18.1  . Smokeless tobacco: Former Network engineer and Sexual Activity  . Alcohol use: No    Alcohol/week: 0.0 oz  . Drug use: No  . Sexual activity: Not on file  Lifestyle  . Physical activity:    Days per week: Not on file    Minutes per session: Not on file  . Stress: Not on file  Relationships  . Social connections:     Talks on phone: Not on file    Gets together: Not on file    Attends religious service: Not on file    Active member of club or organization: Not on file    Attends meetings of clubs or organizations: Not on file    Relationship status: Not on file  . Intimate partner violence:    Fear of current or ex partner: Not on file    Emotionally abused: Not on file    Physically abused: Not on file    Forced sexual activity: Not on file  Other Topics Concern  . Not on file  Social History Narrative  . Not on file   The PMH, PSH, Social History, Family History, Medications, and allergies have been reviewed in [facility], and have been updated if relevant.   Review of Systems  Constitutional: Negative.   Respiratory: Negative.   Cardiovascular: Negative.   Musculoskeletal: Negative.   Skin: Positive for rash.  Neurological: Negative.   Hematological: Negative.   All other systems reviewed and are negative.      Objective:    BP (!) 160/86 (BP Location: Left Arm, Patient Position: Sitting, Cuff Size: Normal)   Pulse 62   Temp 98.5 F (36.9 C) (Oral)   Ht 5\' 10"  (1.778 m)   Wt 232 lb 6.4 oz (105.4 kg)   SpO2 95%   BMI 33.35 kg/m    Physical Exam  Constitutional: He is oriented to person, place, and time. He appears well-developed and well-nourished. No distress.  HENT:  Head: Normocephalic and atraumatic.  Cardiovascular: Normal rate.  Pulmonary/Chest: Effort normal.  Musculoskeletal: Normal range of motion.  Neurological: He is alert and oriented to person, place, and time. No cranial nerve deficit.  Skin: Rash noted. Rash is urticarial. He is not diaphoretic.  Psychiatric: He has a normal mood and affect. His behavior is normal. Judgment and thought content normal.  Nursing note and vitals reviewed.         Assessment & Plan:   No diagnosis found. No follow-ups on file.

## 2017-10-27 NOTE — Patient Instructions (Signed)
Great to see you.  Take prednisone as directed, apply topical triamcinolone.  Change to dye free soaps and detergents.

## 2017-10-27 NOTE — Assessment & Plan Note (Signed)
Consistent with contact/allergic dermatitis but unclear what he is allergic to. Advised to change to dye free/perfume free soaps and detergents. Treat with prednisone dose pack and topical triamcinolone. Call or return to clinic prn if these symptoms worsen or fail to improve as anticipated. The patient indicates understanding of these issues and agrees with the plan.

## 2017-11-12 ENCOUNTER — Ambulatory Visit (INDEPENDENT_AMBULATORY_CARE_PROVIDER_SITE_OTHER): Payer: BLUE CROSS/BLUE SHIELD | Admitting: Orthopedic Surgery

## 2017-11-12 ENCOUNTER — Encounter (INDEPENDENT_AMBULATORY_CARE_PROVIDER_SITE_OTHER): Payer: Self-pay | Admitting: Orthopedic Surgery

## 2017-11-12 DIAGNOSIS — M25511 Pain in right shoulder: Secondary | ICD-10-CM | POA: Diagnosis not present

## 2017-11-12 DIAGNOSIS — M19011 Primary osteoarthritis, right shoulder: Secondary | ICD-10-CM

## 2017-11-12 DIAGNOSIS — G8929 Other chronic pain: Secondary | ICD-10-CM

## 2017-11-12 NOTE — Progress Notes (Signed)
Office Visit Note   Patient: Hayden Deleon           Date of Birth: 1955/10/30           MRN: [id number] Visit Date: 11/12/2017 Requested by: Hayden Passy, MD [place], [place] [address] PCP: Hayden Passy, MD  Subjective: Chief Complaint  Patient presents with  . Right Shoulder - Pain    HPI: Hayden Deleon is a patient with right shoulder pain.'s been bothering him for a few years.  The pain is not severe but it is aggravating.  He states at times and in general the pain is tolerable.  Does not wake him from sleep.  Takes occasional Tylenol.  His health is otherwise okay.  Denies any history of injury but did do a lot of physical work when he was younger.  [place]  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  He is putting in a bridge over a stream has a project for next weekend.  He also enjoys woodworking.              ROS: All systems reviewed are negative as they relate to the chief complaint within the history of present illness.  Patient denies  fevers or chills.   Assessment & Plan: Visit Diagnoses:  1. Chronic right shoulder pain   2. Arthritis of right shoulder region     Plan: Impression is end-stage severe right shoulder glenohumeral arthritis with the patient he was doing fairly high demand physical activities outside of work.  Plan a CT scan thin cut as preoperative templating tool to evaluate really how much glenoid vault he has left.  On the axillary views today which are reviewed he is definitely a B2 glenoid.  I will see him back after that CT scan.  Follow-Up Instructions: No follow-ups on file.   Orders:  Orders Placed This Encounter  Procedures  . CT SHOULDER RIGHT WO CONTRAST   No orders of the defined types were placed in this encounter.     Procedures: No procedures performed   Clinical Data: No additional findings.  Objective: Vital Signs: There were no vitals taken for this  visit.  Physical Exam:   Constitutional: Patient appears well-developed HEENT:  Head: Normocephalic Eyes:EOM are normal Neck: Normal range of motion Cardiovascular: Normal rate Pulmonary/chest: Effort normal Neurologic: Patient is alert Skin: Skin is warm Psychiatric: Patient has normal mood and affect    Ortho Exam: Ortho exam demonstrates full active and passive range of motion of that left shoulder.  Cuff strength intact on the left-hand side.  Cuff strength also looks good on the right-hand side but the patient has essentially external rotation only to neutral.  Forward flexion and abduction is around 75 to 80 degrees.  Deltoid is functional and axillary nerve fires.  Motor or sensory function to the hand is intact.  There is some coarseness and grinding with active and passive range of motion of that right shoulder.  Specialty Comments:  No specialty comments available.  Imaging: No results found.   PMFS History: Patient Active Problem List   Diagnosis Date Noted  . Rash and nonspecific skin eruption 10/27/2017  . BPV (benign positional vertigo), left 07/10/2017  . Vitamin B12 deficiency 04/02/2017  . COPD (chronic obstructive pulmonary disease) ([place]) 10/07/2014  . OSA (obstructive sleep apnea) 05/01/2012  . CAD (coronary artery disease)   . Carotid artery  disease ([place])   . GERD (gastroesophageal reflux disease)   . Ejection fraction   . MIXED HYPERLIPIDEMIA 05/03/2010  . BENIGN POSITIONAL VERTIGO 05/03/2010  . Anxiety state 11/10/2008  . ANGIOEDEMA 08/18/2007  . SYSTOLIC MURMUR 01/74/9449  . DYSPHAGIA UNSPECIFIED 05/22/2007  . PROSTATITIS, HX OF 05/22/2007  . ADVEF, DRUG/MEDICINAL/BIOLOGICAL SUBST NOS 01/26/2007  . COLONIC POLYPS 12/31/2003  . GASTROESOPHAGEAL REFLUX DISEASE, CHRONIC 12/31/2003  . HIATAL HERNIA 12/31/2003  . DIVERTICULOSIS, COLON 12/31/2003  . HLD (hyperlipidemia) 05/02/2001  . HTN (hypertension) 05/02/2001   Past Medical History:    Diagnosis Date  . Allergy   . Anxiety   . Arthritis    right shoulder  . CAD (coronary artery disease)    Catheterization, 2003, occluded OM1, good collaterals  //   nuclear, August, 2009, low risk, EF 60%  . Carotid artery disease ([place])    Doppler, February, 2012, 6-75% R. ICA, 91-63% LICA,  plan followup 1 year  . COPD (chronic obstructive pulmonary disease) ([facility])    Significant Dr. Lamonte Deleon  . Diverticulosis of colon   . Ejection fraction    EF 60%, nuclear, 2009  . GERD (gastroesophageal reflux disease)   . Hyperlipidemia   . Hypertension   . Prostatitis   . Sleep apnea    does not use CPAP    Family History  Problem Relation Age of Onset  . Angina Mother        car accident  . Asthma Father   . Crohn's disease Sister   . Colon cancer Neg Hx   . Esophageal cancer Neg Hx   . Rectal cancer Neg Hx   . Stomach cancer Neg Hx     Past Surgical History:  Procedure Laterality Date  . COLONOSCOPY  12/2003   patterson - polyp  . deviated septum    . KNEE ARTHROSCOPY     right  . WISDOM TOOTH EXTRACTION     Social History   Occupational History  . Occupation: Freight forwarder at [employer]    Employer: [employer]  Tobacco Use  . Smoking status: Former Smoker    Packs/day: 2.00    Years: 32.00    Pack years: 64.00    Types: Cigarettes    Last attempt to quit: 08/31/1999    Years since quitting: 18.2  . Smokeless tobacco: Former Network engineer and Sexual Activity  . Alcohol use: No    Alcohol/week: 0.0 standard drinks  . Drug use: No  . Sexual activity: Not on file

## 2017-11-28 ENCOUNTER — Ambulatory Visit
Admission: RE | Admit: 2017-11-28 | Discharge: 2017-11-28 | Disposition: A | Discharge status: Home / Self Care | Payer: BLUE CROSS/BLUE SHIELD | Source: Ambulatory Visit | Attending: Orthopedic Surgery | Admitting: Orthopedic Surgery

## 2017-11-28 DIAGNOSIS — M19011 Primary osteoarthritis, right shoulder: Secondary | ICD-10-CM | POA: Diagnosis not present

## 2017-11-28 DIAGNOSIS — M25511 Pain in right shoulder: Principal | ICD-10-CM

## 2017-11-28 DIAGNOSIS — G8929 Other chronic pain: Secondary | ICD-10-CM

## 2017-12-01 ENCOUNTER — Other Ambulatory Visit: Payer: Self-pay | Admitting: Family Medicine

## 2017-12-03 ENCOUNTER — Other Ambulatory Visit: Payer: Self-pay | Admitting: Family Medicine

## 2017-12-10 ENCOUNTER — Encounter (INDEPENDENT_AMBULATORY_CARE_PROVIDER_SITE_OTHER): Payer: Self-pay | Admitting: Orthopedic Surgery

## 2017-12-10 ENCOUNTER — Ambulatory Visit (INDEPENDENT_AMBULATORY_CARE_PROVIDER_SITE_OTHER): Payer: BLUE CROSS/BLUE SHIELD | Admitting: Orthopedic Surgery

## 2017-12-10 DIAGNOSIS — M19011 Primary osteoarthritis, right shoulder: Secondary | ICD-10-CM

## 2017-12-10 NOTE — Progress Notes (Signed)
Office Visit Note   Patient: Hayden Deleon           Date of Birth: [birth year]           MRN: [id number] Visit Date: 12/10/2017 Requested by: Lucille Passy, MD [place], [place] [address] PCP: Lucille Passy, MD  Subjective: Chief Complaint  Patient presents with  . Right Shoulder - Follow-up    HPI: Hayden Deleon is a patient with right shoulder arthritis.  Since of seen him he had a CT scan.  He does have B2 glenoid and very large osteophyte off the inferior humeral head.  He also has a history of COPD and emphysema as well as angina.  He will need cardiac clearance and risk stratification prior to surgery.  He would like to have his shoulder replaced first week in November.  He does report night pain and rest pain and pain which generally interferes with his activities of daily living.              ROS: All systems reviewed are negative as they relate to the chief complaint within the history of present illness.  Patient denies  fevers or chills.   Assessment & Plan: Visit Diagnoses:  1. Arthritis of right shoulder region     Plan: Impression is right shoulder arthritis with some glenoid wear and significant bone spurring.  The spurs would have to be carefully removed at the time of surgery as they do extend into the axillary region.  Risks and benefits of surgery are discussed including but not limited to infection nerve vessel damage shoulder instability as well as the potential for more surgery.  Patient understands the risk and benefits.  All questions answered.  At this time I would plan tentatively for total shoulder replacement with modular glenoid based on patient specific instrumentation.  Follow-Up Instructions: No follow-ups on file.   Orders:  No orders of the defined types were placed in this encounter.  No orders of the defined types were placed in this encounter.     Procedures: No procedures performed   Clinical Data: No additional  findings.  Objective: Vital Signs: There were no vitals taken for this visit.  Physical Exam:   Constitutional: Patient appears well-developed HEENT:  Head: Normocephalic Eyes:EOM are normal Neck: Normal range of motion Cardiovascular: Normal rate Pulmonary/chest: Effort normal Neurologic: Patient is alert Skin: Skin is warm Psychiatric: Patient has normal mood and affect  Patient is not on home O2  Ortho Exam: Ortho exam demonstrates external rotation to about 10 degrees.  Isolated forward flexion and abduction both below 90.  Deltoid is functional.  Rotator cuff strength intact infraspinatus supraspinatus and subscap muscle testing.  Specialty Comments:  No specialty comments available.  Imaging: No results found.   PMFS History: Patient Active Problem List   Diagnosis Date Noted  . Rash and nonspecific skin eruption 10/27/2017  . BPV (benign positional vertigo), left 07/10/2017  . Vitamin B12 deficiency 04/02/2017  . COPD (chronic obstructive pulmonary disease) ([place]) 10/07/2014  . OSA (obstructive sleep apnea) 05/01/2012  . CAD (coronary artery disease)   . Carotid artery disease ([place])   . GERD (gastroesophageal reflux disease)   . Ejection fraction   . MIXED HYPERLIPIDEMIA 05/03/2010  . BENIGN POSITIONAL VERTIGO 05/03/2010  . Anxiety state 11/10/2008  . ANGIOEDEMA 08/18/2007  . SYSTOLIC MURMUR 62/70/3500  . DYSPHAGIA UNSPECIFIED 05/22/2007  . PROSTATITIS, HX OF 05/22/2007  . ADVEF, DRUG/MEDICINAL/BIOLOGICAL SUBST NOS 01/26/2007  .  COLONIC POLYPS 12/31/2003  . GASTROESOPHAGEAL REFLUX DISEASE, CHRONIC 12/31/2003  . HIATAL HERNIA 12/31/2003  . DIVERTICULOSIS, COLON 12/31/2003  . HLD (hyperlipidemia) 05/02/2001  . HTN (hypertension) 05/02/2001   Past Medical History:  Diagnosis Date  . Allergy   . Anxiety   . Arthritis    right shoulder  . CAD (coronary artery disease)    Catheterization, 2003, occluded OM1, good collaterals  //   nuclear, August, 2009,  low risk, EF 60%  . Carotid artery disease ([place])    Doppler, February, 2012, 3-53% R. ICA, 29-92% LICA,  plan followup 1 year  . COPD (chronic obstructive pulmonary disease) ([facility])    Significant Dr. Lamonte Sakai  . Diverticulosis of colon   . Ejection fraction    EF 60%, nuclear, 2009  . GERD (gastroesophageal reflux disease)   . Hyperlipidemia   . Hypertension   . Prostatitis   . Sleep apnea    does not use CPAP    Family History  Problem Relation Age of Onset  . Angina Mother        car accident  . Asthma Father   . Crohn's disease Sister   . Colon cancer Neg Hx   . Esophageal cancer Neg Hx   . Rectal cancer Neg Hx   . Stomach cancer Neg Hx     Past Surgical History:  Procedure Laterality Date  . COLONOSCOPY  12/2003   patterson - polyp  . deviated septum    . KNEE ARTHROSCOPY     right  . WISDOM TOOTH EXTRACTION     Social History   Occupational History  . Occupation: Freight forwarder at [employer]    Employer: [employer]  Tobacco Use  . Smoking status: Former Smoker    Packs/day: 2.00    Years: 32.00    Pack years: 64.00    Types: Cigarettes    Last attempt to quit: 08/31/1999    Years since quitting: 18.2  . Smokeless tobacco: Former Network engineer and Sexual Activity  . Alcohol use: No    Alcohol/week: 0.0 standard drinks  . Drug use: No  . Sexual activity: Not on file

## 2017-12-15 ENCOUNTER — Other Ambulatory Visit: Payer: Self-pay

## 2017-12-15 MED ORDER — ATORVASTATIN CALCIUM 40 MG PO TABS: 40.0000 mg | ORAL_TABLET | Freq: Every day | ORAL | 1 refills | Status: DC

## 2017-12-21 ENCOUNTER — Encounter: Payer: Self-pay | Admitting: Family Medicine

## 2017-12-22 ENCOUNTER — Ambulatory Visit: Payer: BLUE CROSS/BLUE SHIELD | Admitting: Family Medicine

## 2017-12-22 ENCOUNTER — Telehealth: Payer: BLUE CROSS/BLUE SHIELD | Admitting: Family

## 2017-12-22 DIAGNOSIS — R059 Cough, unspecified: Secondary | ICD-10-CM

## 2017-12-22 DIAGNOSIS — R05 Cough: Secondary | ICD-10-CM

## 2017-12-22 MED ORDER — BENZONATATE 100 MG PO CAPS: 100.0000 mg | ORAL_CAPSULE | Freq: Three times a day (TID) | ORAL | 0 refills | Status: DC | PRN

## 2017-12-22 NOTE — Progress Notes (Signed)
We are sorry that you are not feeling well.  Here is how we plan to help!  Based on your presentation I believe you most likely have A cough due to a virus.  This is called viral bronchitis and is best treated by rest, plenty of fluids and control of the cough.  You may use Ibuprofen or Tylenol as directed to help your symptoms.     In addition you may use A non-prescription cough medication called Robitussin DAC. Take 2 teaspoons every 8 hours or Delsym: take 2 teaspoons every 12 hours., A non-prescription cough medication called Mucinex DM: take 2 tablets every 12 hours. and A prescription cough medication called Tessalon Perles 100mg . You may take 1-2 capsules every 8 hours as needed for your cough.  Providers prescribe antibiotics to treat infections caused by bacteria. Antibiotics are very powerful in treating bacterial infections when they are used properly. To maintain their effectiveness, they should be used only when necessary. Overuse of antibiotics has resulted in the development of superbugs that are resistant to treatment!    After careful review of your answers, I would not recommend an antibiotic for your condition.  Antibiotics are not effective against viruses and therefore should not be used to treat them. Common examples of infections caused by viruses include colds and flu   From your responses in the eVisit questionnaire you describe inflammation in the upper respiratory tract which is causing a significant cough.  This is commonly called Bronchitis and has four common causes:    Allergies  Viral Infections  Acid Reflux  Bacterial Infection Allergies, viruses and acid reflux are treated by controlling symptoms or eliminating the cause. An example might be a cough caused by taking certain blood pressure medications. You stop the cough by changing the medication. Another example might be a cough caused by acid reflux. Controlling the reflux helps control the cough.  USE OF  BRONCHODILATOR ("RESCUE") INHALERS: There is a risk from using your bronchodilator too frequently.  The risk is that over-reliance on a medication which only relaxes the muscles surrounding the breathing tubes can reduce the effectiveness of medications prescribed to reduce swelling and congestion of the tubes themselves.  Although you feel brief relief from the bronchodilator inhaler, your asthma may actually be worsening with the tubes becoming more swollen and filled with mucus.  This can delay other crucial treatments, such as oral steroid medications. If you need to use a bronchodilator inhaler daily, several times per day, you should discuss this with your provider.  There are probably better treatments that could be used to keep your asthma under control.     HOME CARE . Only take medications as instructed by your medical team. . Complete the entire course of an antibiotic. . Drink plenty of fluids and get plenty of rest. . Avoid close contacts especially the very young and the elderly . Cover your mouth if you cough or cough into your sleeve. . Always remember to wash your hands . A steam or ultrasonic humidifier can help congestion.   GET HELP RIGHT AWAY IF: . You develop worsening fever. . You become short of breath . You cough up blood. . Your symptoms persist after you have completed your treatment plan MAKE SURE YOU   Understand these instructions.  Will watch your condition.  Will get help right away if you are not doing well or get worse.  Your e-visit answers were reviewed by a board certified advanced clinical practitioner to complete  your personal care plan.  Depending on the condition, your plan could have included both over the counter or prescription medications. If there is a problem please reply  once you have received a response from your provider. Your safety is important to [place].  If you have drug allergies check your prescription carefully.    You can use MyChart  to ask questions about today's visit, request a non-urgent call back, or ask for a work or school excuse for 24 hours related to this e-Visit. If it has been greater than 24 hours you will need to follow up with your provider, or enter a new e-Visit to address those concerns. You will get an e-mail in the next two days asking about your experience.  I hope that your e-visit has been valuable and will speed your recovery. Thank you for using e-visits.

## 2018-01-21 ENCOUNTER — Ambulatory Visit (INDEPENDENT_AMBULATORY_CARE_PROVIDER_SITE_OTHER): Payer: BLUE CROSS/BLUE SHIELD | Admitting: Family Medicine

## 2018-01-21 ENCOUNTER — Encounter: Payer: Self-pay | Admitting: Family Medicine

## 2018-01-21 VITALS — BP 132/68 | HR 62 | Temp 98.8°F | Ht 70.0 in | Wt 230.2 lb

## 2018-01-21 DIAGNOSIS — M109 Gout, unspecified: Secondary | ICD-10-CM | POA: Insufficient documentation

## 2018-01-21 DIAGNOSIS — I779 Disorder of arteries and arterioles, unspecified: Secondary | ICD-10-CM

## 2018-01-21 DIAGNOSIS — Z125 Encounter for screening for malignant neoplasm of prostate: Secondary | ICD-10-CM

## 2018-01-21 DIAGNOSIS — R7989 Other specified abnormal findings of blood chemistry: Secondary | ICD-10-CM | POA: Diagnosis not present

## 2018-01-21 DIAGNOSIS — I739 Peripheral vascular disease, unspecified: Secondary | ICD-10-CM | POA: Diagnosis not present

## 2018-01-21 LAB — COMPREHENSIVE METABOLIC PANEL
ALK PHOS: 56 U/L (ref 39–117)
ALT: 49 U/L (ref 0–53)
AST: 26 U/L (ref 0–37)
Albumin: 4.2 g/dL (ref 3.5–5.2)
BILIRUBIN TOTAL: 0.7 mg/dL (ref 0.2–1.2)
BUN: 14 mg/dL (ref 6–23)
CALCIUM: 9.5 mg/dL (ref 8.4–10.5)
CO2: 28 meq/L (ref 19–32)
Chloride: 106 mEq/L (ref 96–112)
Creatinine, Ser: 1.06 mg/dL (ref 0.40–1.50)
GFR: 75.21 mL/min (ref >60.00)
GLUCOSE: 107 mg/dL — AB (ref 70–99)
POTASSIUM: 4.4 meq/L (ref 3.5–5.1)
Sodium: 143 mEq/L (ref 135–145)
TOTAL PROTEIN: 6.6 g/dL (ref 6.0–8.3)

## 2018-01-21 LAB — CBC
HEMATOCRIT: 45.1 % (ref 39.0–52.0)
Hemoglobin: 15.5 g/dL (ref 13.0–17.0)
MCHC: 34.4 g/dL (ref 30.0–36.0)
MCV: 97 fl (ref 78.0–100.0)
PLATELETS: 177 10*3/uL (ref 150.0–400.0)
RBC: 4.65 Mil/uL (ref 4.22–5.81)
RDW: 13.3 % (ref 11.5–15.5)
WBC: 7.8 10*3/uL (ref 4.0–10.5)

## 2018-01-21 LAB — PSA: PSA: 1.43 ng/mL (ref 0.10–4.00)

## 2018-01-21 LAB — URIC ACID: Uric Acid, Serum: 8.5 mg/dL — ABNORMAL HIGH (ref 4.0–7.8)

## 2018-01-21 LAB — TESTOSTERONE: TESTOSTERONE: 291.08 ng/dL — AB (ref 300.00–890.00)

## 2018-01-21 MED ORDER — PREDNISONE 20 MG PO TABS: ORAL_TABLET | ORAL | 0 refills | Status: DC

## 2018-01-21 NOTE — Patient Instructions (Signed)

## 2018-01-21 NOTE — Progress Notes (Signed)
Subjective:   Patient ID: Hayden Deleon, male    DOB: 22-May-1955, 62 y.o.   MRN: [id number]  Hayden Deleon is a pleasant 62 y.o. year old male who presents to clinic today with Toe Pain (Patient is here today C/O pain at the base of right 1st toe.  Flare-up started 4-weeks-ago.  Swelling is gone today but yesterday could hardly get a shoe on.  Has a H/O gout and it feels the same.  FYI: having right shoulder replacement on 11.5.19.) and low testosterone  on 01/21/2018  HPI: ?gout flare- Having pain and swelling in right great toe for approximately 4 weeks.  Swelling has improved but yesterday it was so painful and swollen that he could hardly get his shoe on.  This feels very similar to previous gout flares.  Scheduled to have a right shoulder replacement in less than 2 weeks (02/03/18) by Dr. Marlou Sa.  He was diagnosed with low testosterone years ago, was follow by urology and stopped going.  He wants to know if he has low T still.  He is fatigued, no new sexual issues.  Current Outpatient Medications on File Prior to Visit  Medication Sig Dispense Refill  . albuterol (VENTOLIN HFA) 108 (90 Base) MCG/ACT inhaler INHALE 2 PUFFS INTO THE LUNGS EVERY 6 HOURS AS NEEDED FOR WHEEZING 18 g 2  . aspirin 81 MG tablet Take 81 mg by mouth daily.      [name] atorvastatin (LIPITOR) 40 MG tablet Take 1 tablet (40 mg total) by mouth daily. 90 tablet 1  . metoprolol tartrate (LOPRESSOR) 50 MG tablet TAKE 1 TABLET BY MOUTH TWICE A DAY (Patient taking differently: Take 50 mg by mouth daily. ) 60 tablet 5  . Multiple Vitamin (MULTIVITAMIN) capsule Take 1 capsule by mouth daily.      . sertraline (ZOLOFT) 50 MG tablet TAKE 1 TABLET BY MOUTH EVERY DAY 30 tablet 2  . TRELEGY ELLIPTA 100-62.5-25 MCG/INH AEPB USE 1 PUFF DAILY AS DIRECTED (Patient taking differently: Inhale 1 puff into the lungs daily. ) 60 each 5  . vitamin B-12 (CYANOCOBALAMIN) 1000 MCG tablet Take 1,000 mcg by mouth daily.     No current  facility-administered medications on file prior to visit.     Allergies  Allergen Reactions  . Avelox [Moxifloxacin Hcl In Nacl] Other (See Comments)    Facial swelling  . Sulfamethoxazole-Trimethoprim Other (See Comments)    REACTION: sore mouth  . Aspirin Other (See Comments)    REACTION: Nausea; fine with 81mg     Past Medical History:  Diagnosis Date  . Allergy   . Anxiety   . Arthritis    right shoulder  . CAD (coronary artery disease)    Catheterization, 2003, occluded OM1, good collaterals  //   nuclear, August, 2009, low risk, EF 60%  . Carotid artery disease ([place])    Doppler, February, 2012, 3-84% R. ICA, 66-59% LICA,  plan followup 1 year  . COPD (chronic obstructive pulmonary disease) ([facility])    Significant Dr. Lamonte Sakai  . Diverticulosis of colon   . Ejection fraction    EF 60%, nuclear, 2009  . GERD (gastroesophageal reflux disease)   . Hyperlipidemia   . Hypertension   . Prostatitis   . Sleep apnea    does not use CPAP    Past Surgical History:  Procedure Laterality Date  . COLONOSCOPY  12/2003   patterson - polyp  . deviated septum    . KNEE ARTHROSCOPY  right  . WISDOM TOOTH EXTRACTION      Family History  Problem Relation Age of Onset  . Angina Mother        car accident  . Asthma Father   . Crohn's disease Sister   . Colon cancer Neg Hx   . Esophageal cancer Neg Hx   . Rectal cancer Neg Hx   . Stomach cancer Neg Hx     Social History   Socioeconomic History  . Marital status: Married    Spouse name: Not on file  . Number of children: 0  . Years of education: Not on file  . Highest education level: Not on file  Occupational History  . Occupation: Freight forwarder at [employer]    Employer: [employer]  Social Needs  . Financial resource strain: Not on file  . Food insecurity:    Worry: Not on file    Inability: Not on file  . Transportation needs:    Medical: Not on file    Non-medical: Not on file  Tobacco Use  .  Smoking status: Former Smoker    Packs/day: 2.00    Years: 32.00    Pack years: 64.00    Types: Cigarettes    Last attempt to quit: 08/31/1999    Years since quitting: 18.4  . Smokeless tobacco: Former Network engineer and Sexual Activity  . Alcohol use: No    Alcohol/week: 0.0 standard drinks  . Drug use: No  . Sexual activity: Not on file  Lifestyle  . Physical activity:    Days per week: Not on file    Minutes per session: Not on file  . Stress: Not on file  Relationships  . Social connections:    Talks on phone: Not on file    Gets together: Not on file    Attends religious service: Not on file    Active member of club or organization: Not on file    Attends meetings of clubs or organizations: Not on file    Relationship status: Not on file  . Intimate partner violence:    Fear of current or ex partner: Not on file    Emotionally abused: Not on file    Physically abused: Not on file    Forced sexual activity: Not on file  Other Topics Concern  . Not on file  Social History Narrative  . Not on file   The PMH, PSH, Social History, Family History, Medications, and allergies have been reviewed in [facility], and have been updated if relevant.   Review of Systems  Constitutional: Positive for fatigue.  Respiratory: Negative.   Cardiovascular: Negative.   Gastrointestinal: Negative.   Musculoskeletal: Positive for arthralgias.  Skin: Positive for color change.  All other systems reviewed and are negative.      Objective:    BP 132/68 (BP Location: Left Arm, Patient Position: Sitting, Cuff Size: Normal)   Pulse 62   Temp 98.8 F (37.1 C) (Oral)   Ht 5\' 10"  (1.778 m)   Wt 230 lb 3.2 oz (104.4 kg)   SpO2 93%   BMI 33.03 kg/m    Physical Exam  Constitutional: He is oriented to person, place, and time. He appears well-developed and well-nourished. No distress.  HENT:  Head: Normocephalic and atraumatic.  Eyes: EOM are normal.  Neck: Normal range of motion.    Cardiovascular: Normal rate.  Pulmonary/Chest: Effort normal.  Musculoskeletal:       Feet:  Neurological: He is alert  and oriented to person, place, and time. No cranial nerve deficit.  Skin: Skin is warm and dry. He is not diaphoretic.  Psychiatric: He has a normal mood and affect. His behavior is normal. Judgment and thought content normal.  Nursing note and vitals reviewed.         Assessment & Plan:   Acute gout involving toe of right foot, unspecified cause - Plan: Uric acid  Low testosterone - Plan: Testosterone, Testosterone, free, Sex hormone binding globulin  Prostate cancer screening - Plan: PSA  Bilateral carotid artery disease, unspecified type ([place]) - Plan: CBC, Comprehensive metabolic panel No follow-ups on file.

## 2018-01-21 NOTE — Assessment & Plan Note (Signed)
Acute on chronic. Will treat with prednisone taper.  He does seem to be improving- finally after 4 weeks. Will check a uric acid today along with his kidney function.

## 2018-01-21 NOTE — Assessment & Plan Note (Signed)
?   Deteriorated. He would like testosterone checked and treatment initiated if it is in fact low.  Will check PSA and CBC along with testosterone since they are monitoring parameters with testosterone supplementation. The patient indicates understanding of these issues and agrees with the plan.

## 2018-01-23 ENCOUNTER — Other Ambulatory Visit (INDEPENDENT_AMBULATORY_CARE_PROVIDER_SITE_OTHER): Payer: Self-pay | Admitting: Orthopedic Surgery

## 2018-01-23 DIAGNOSIS — M19011 Primary osteoarthritis, right shoulder: Secondary | ICD-10-CM

## 2018-01-23 NOTE — Pre-Procedure Instructions (Signed)
Hayden Deleon  01/23/2018      [facility] - [place], [place] - [address], [address], [address] [place] [address] Phone: [phone] Fax: [phone]  [id number] - [place], [place] [address] Phone: [phone] Fax: [phone]  [facility] [place], [place] [address] Phone: [phone] Fax: [phone]    Your procedure is scheduled on November5th.  Report to [facility] Admitting at 1000 A.M.  Call this number if you have problems the morning of surgery:  [phone]   Remember:  Do not eat or drink after midnight    Take these medicines the morning of surgery with A SIP OF WATER   albuterol (VENTOLIN HFA) (if needed) Bring Inhaler with you  atorvastatin (LIPITOR)  metoprolol tartrate (LOPRESSOR)  sertraline (ZOLOFT)  TRELEGY ELLIPTA  7 days prior to surgery STOP taking any Aspirin(unless otherwise instructed by your surgeon), Aleve, Naproxen, Ibuprofen, Motrin, Advil, Goody's, BC's, all herbal medications, fish oil, and all vitamins    Do not wear jewelry.  Do not wear lotions, powders, or colognes, or deodorant.  Men may shave face and neck.  Do not bring valuables to the hospital.  [facility] is not responsible for any belongings or valuables.  Contacts, dentures or bridgework may not be worn into surgery.  Leave your suitcase in the car.  After surgery it may be brought to your room.  For patients admitted to the hospital, discharge time will be determined by your treatment team.  Patients discharged the day of surgery will not be allowed to drive home.    [place]- Preparing For Surgery  Before surgery, you can play an important role. Because skin is not sterile, your skin needs to be as free of germs as possible. You can reduce the number of germs on your skin by  washing with CHG (chlorahexidine gluconate) Soap before surgery.  CHG is an antiseptic cleaner which kills germs and bonds with the skin to continue killing germs even after washing.    Oral Hygiene is also important to reduce your risk of infection.  Remember - BRUSH YOUR TEETH THE MORNING OF SURGERY WITH YOUR REGULAR TOOTHPASTE  Please do not use if you have an allergy to CHG or antibacterial soaps. If your skin becomes reddened/irritated stop using the CHG.  Do not shave (including legs and underarms) for at least 48 hours prior to first CHG shower. It is OK to shave your face.  Please follow these instructions carefully.   1. Shower the NIGHT BEFORE SURGERY and the MORNING OF SURGERY with CHG.   2. If you chose to wash your hair, wash your hair first as usual with your normal shampoo.  3. After you shampoo, rinse your hair and body thoroughly to remove the shampoo.  4. Use CHG as you would any other liquid soap. You can apply CHG directly to the skin and wash gently with a scrungie or a clean washcloth.   5. Apply the CHG Soap to your body ONLY FROM THE NECK DOWN.  Do not use on open wounds or open sores. Avoid contact with your eyes, ears, mouth and genitals (private parts). Wash Face and genitals (private parts)  with your normal soap.  6. Wash thoroughly, paying special attention to the area where your surgery will be performed.  7. Thoroughly rinse your body with warm water from the neck down.  8. DO NOT shower/wash with your normal soap after using and rinsing off the CHG Soap.  9. Pat yourself dry with a CLEAN TOWEL.  10. Wear CLEAN PAJAMAS to bed the night before surgery, wear comfortable clothes the morning of surgery  11. Place CLEAN SHEETS on your bed the night of your first shower and DO NOT SLEEP WITH PETS.    Day of Surgery:  Do not apply any deodorants/lotions.  Please wear clean clothes to the hospital/surgery center.   Remember to brush your teeth WITH YOUR  REGULAR TOOTHPASTE.    Please read over the following fact sheets that you were given.

## 2018-01-24 LAB — SEX HORMONE BINDING GLOBULIN: SEX HORMONE BINDING: 44 nmol/L (ref 22–77)

## 2018-01-24 LAB — TESTOSTERONE, FREE: TESTOSTERONE FREE: 37.3 pg/mL — ABNORMAL LOW (ref 46.0–224.0)

## 2018-01-26 ENCOUNTER — Encounter (HOSPITAL_COMMUNITY): Payer: Self-pay

## 2018-01-26 ENCOUNTER — Encounter (HOSPITAL_COMMUNITY)
Admission: RE | Admit: 2018-01-26 | Discharge: 2018-01-26 | Disposition: A | Discharge status: Home / Self Care | Payer: BLUE CROSS/BLUE SHIELD | Source: Ambulatory Visit | Attending: Orthopedic Surgery | Admitting: Orthopedic Surgery

## 2018-01-26 DIAGNOSIS — Z01812 Encounter for preprocedural laboratory examination: Secondary | ICD-10-CM | POA: Insufficient documentation

## 2018-01-26 DIAGNOSIS — M19011 Primary osteoarthritis, right shoulder: Secondary | ICD-10-CM | POA: Insufficient documentation

## 2018-01-26 HISTORY — DX: Gout, unspecified: M10.9

## 2018-01-26 LAB — URINALYSIS, ROUTINE W REFLEX MICROSCOPIC
Bilirubin Urine: NEGATIVE
Glucose, UA: NEGATIVE mg/dL
Hgb urine dipstick: NEGATIVE
KETONES UR: NEGATIVE mg/dL
Leukocytes, UA: NEGATIVE
NITRITE: NEGATIVE
PH: 5 (ref 5.0–8.0)
Protein, ur: NEGATIVE mg/dL
Specific Gravity, Urine: 1.027 (ref 1.005–1.030)

## 2018-01-26 LAB — SURGICAL PCR SCREEN
MRSA, PCR: NEGATIVE
Staphylococcus aureus: POSITIVE — AB

## 2018-01-26 LAB — CBC
HCT: 47.9 % (ref 39.0–52.0)
Hemoglobin: 15.5 g/dL (ref 13.0–17.0)
MCH: 32 pg (ref 26.0–34.0)
MCHC: 32.4 g/dL (ref 30.0–36.0)
MCV: 99 fL (ref 80.0–100.0)
Platelets: 223 10*3/uL (ref 150–400)
RBC: 4.84 MIL/uL (ref 4.22–5.81)
RDW: 12.9 % (ref 11.5–15.5)
WBC: 14 10*3/uL — ABNORMAL HIGH (ref 4.0–10.5)
nRBC: 0 % (ref 0.0–0.2)

## 2018-01-26 LAB — BASIC METABOLIC PANEL
Anion gap: 5 (ref 5–15)
BUN: 15 mg/dL (ref 8–23)
CO2: 29 mmol/L (ref 22–32)
Calcium: 9.4 mg/dL (ref 8.9–10.3)
Chloride: 109 mmol/L (ref 98–111)
Creatinine, Ser: 1 mg/dL (ref 0.61–1.24)
GFR calc Af Amer: >60 mL/min (ref >60)
GFR calc non Af Amer: >60 mL/min (ref >60)
Glucose, Bld: 75 mg/dL (ref 70–99)
Potassium: 3.6 mmol/L (ref 3.5–5.1)
Sodium: 143 mmol/L (ref 135–145)

## 2018-01-28 ENCOUNTER — Other Ambulatory Visit: Payer: Self-pay | Admitting: Family Medicine

## 2018-01-28 ENCOUNTER — Telehealth: Payer: Self-pay | Admitting: Cardiovascular Disease

## 2018-01-28 LAB — URINE CULTURE: Culture: NO GROWTH

## 2018-01-28 NOTE — Telephone Encounter (Signed)
° °  [facility] Pre-operative Risk Assessment    Request for surgical clearance:  1. What type of surgery is being performed? R Shoulder Replacement   2. When is this surgery scheduled? 02/03/18  3. What type of clearance is required (medical clearance vs. Pharmacy clearance to hold med vs. Both)? Medical   4. Are there any medications that need to be held prior to surgery and how long? Not noted   5. Practice name and name of physician performing surgery? [facility]   6. What is your office phone number [phone]   7.   What is your office fax number [phone]  8.   Anesthesia type (None, local, MAC, general) ? Not noted    Clarisse Gouge 01/28/2018, 3:53 PM  _________________________________________________________________   (provider comments below)

## 2018-01-28 NOTE — Telephone Encounter (Signed)
Patient has not been seen in office since 01/2017. He is due for follow up prior to being able to obtain clearance.  Called and patient not home. Spoke with wife and she gave me patient's work cell number to try to get in touch with him.  Message sent to Dr Fletcher Anon who said it was ok to add patient on to 01/30/18 at 3:20pm.  Could not get in touch with patient. Left message to call us to verify if he can make it to see Dr Fletcher Anon on 01/30/18 at 3:20pm.  Called wife back and she will pass on the message that Dr Fletcher Anon can see patient on 01/30/18 at 3:20pm. Advised her or patient to call back if patient is not able to come at that time and date.  She was very Patent attorney.

## 2018-01-29 ENCOUNTER — Other Ambulatory Visit: Payer: Self-pay | Admitting: Family Medicine

## 2018-01-30 ENCOUNTER — Encounter: Payer: Self-pay | Admitting: Cardiovascular Disease

## 2018-01-30 ENCOUNTER — Ambulatory Visit (INDEPENDENT_AMBULATORY_CARE_PROVIDER_SITE_OTHER): Payer: BLUE CROSS/BLUE SHIELD | Admitting: Cardiovascular Disease

## 2018-01-30 VITALS — BP 108/60 | HR 64 | Ht 71.0 in | Wt 233.8 lb

## 2018-01-30 DIAGNOSIS — R011 Cardiac murmur, unspecified: Secondary | ICD-10-CM

## 2018-01-30 DIAGNOSIS — E785 Hyperlipidemia, unspecified: Secondary | ICD-10-CM

## 2018-01-30 DIAGNOSIS — I10 Essential (primary) hypertension: Secondary | ICD-10-CM

## 2018-01-30 DIAGNOSIS — I25118 Atherosclerotic heart disease of native coronary artery with other forms of angina pectoris: Secondary | ICD-10-CM | POA: Diagnosis not present

## 2018-01-30 DIAGNOSIS — Z01810 Encounter for preprocedural cardiovascular examination: Secondary | ICD-10-CM

## 2018-01-30 NOTE — Telephone Encounter (Signed)
Clearance faxed to [place] at [phone]

## 2018-01-30 NOTE — Patient Instructions (Signed)
Medication Instructions:  No changes  If you need a refill on your cardiac medications before your next appointment, please call your pharmacy.   Lab work: None ordered  Testing/Procedures: Your physician has requested that you have an echocardiogram in one month. Echocardiography is a painless test that uses sound waves to create images of your heart. It provides your doctor with information about the size and shape of your heart and how well your heart's chambers and valves are working. You may receive an ultrasound enhancing agent through an IV if needed to better visualize your heart during the echo.This procedure takes approximately one hour. There are no restrictions for this procedure. This will take place at the [facility] clinic.    Follow-Up: At [facility], you and your health needs are our priority.  As part of our continuing mission to provide you with exceptional heart care, we have created designated Provider Care Teams.  These Care Teams include your primary Cardiologist (physician) and Advanced Practice Providers (APPs -  Physician Assistants and Nurse Practitioners) who all work together to provide you with the care you need, when you need it. You will need a follow up appointment in 12 months.  Please call our office 2 months in advance to schedule this appointment.  You may see Dr. Fletcher Anon or one of the following Advanced Practice Providers on your designated Care Team:   Murray Hodgkins, NP Christell Faith, PA-C . Marrianne Mood, PA-C

## 2018-01-30 NOTE — Anesthesia Preprocedure Evaluation (Addendum)
Anesthesia Evaluation  Patient identified by MRN, date of birth, ID band Patient awake    Reviewed: Allergy & Precautions, NPO status , Patient's Chart, lab work & pertinent test results  Airway Mallampati: II  TM Distance: >3 FB Neck ROM: Full    Dental no notable dental hx. (+) Poor Dentition, Dental Advisory Given   Pulmonary sleep apnea and Continuous Positive Airway Pressure Ventilation , COPD, former smoker,    Pulmonary exam normal breath sounds clear to auscultation       Cardiovascular hypertension, Pt. on medications + CAD  Normal cardiovascular exam Rhythm:Regular Rate:Normal     Neuro/Psych negative neurological ROS  negative psych ROS   GI/Hepatic negative GI ROS, Neg liver ROS,   Endo/Other  negative endocrine ROS  Renal/GU negative Renal ROS  negative genitourinary   Musculoskeletal negative musculoskeletal ROS (+)   Abdominal   Peds negative pediatric ROS (+)  Hematology negative hematology ROS (+)   Anesthesia Other Findings   Reproductive/Obstetrics negative OB ROS                           Anesthesia Physical Anesthesia Plan  ASA: III  Anesthesia Plan: General   Post-op Pain Management:    Induction: Intravenous  PONV Risk Score and Plan: 2 and Ondansetron, Dexamethasone and Treatment may vary due to age or medical condition  Airway Management Planned: Oral ETT  Additional Equipment:   Intra-op Plan:   Post-operative Plan: Extubation in OR  Informed Consent: I have reviewed the patients History and Physical, chart, labs and discussed the procedure including the risks, benefits and alternatives for the proposed anesthesia with the patient or authorized representative who has indicated his/her understanding and acceptance.   Dental advisory given  Plan Discussed with: CRNA  Anesthesia Plan Comments: (Cardiac clearance 01/30/2018 by Dr. Fletcher Anon. "Preoperative  cardiovascular evaluation for shoulder surgery: The patient cardiac symptoms have been stable for many years and his EKG is normal.  The patient is he is considered at low risk for cardiovascular complications.  No need for stress test before surgery.")       Anesthesia Quick Evaluation

## 2018-01-30 NOTE — Progress Notes (Signed)
Cardiology Office Note   Date:  01/30/2018   ID:  [name], [place] February 07, 1956, MRN [id number]  PCP:  Lucille Passy, MD  Cardiologist:   [name], MD   Chief Complaint  Patient presents with  . other    Cardiac clearance RT shoulder replacement c/o occassional chest tightness after eating/steps . Meds reviewed verbally with pt.      History of Present Illness: Hayden Deleon is a 62 y.o. male who is here today for follow-up visit regarding coronary artery disease and stable angina.Previous cardiac catheterization in 2003 showed occluded OM1 with collaterals with 60-70% proximal RCA stenosis.  Ejection fraction was normal.  The patient has been managed medically since then.  He has known history of COPD and quit smoking in 2005.  He has hyperlipidemia and hypertension. He has prolonged history of stable angina with mild chest pain happening with overexertion.  The pattern has been stable for many years. He had a pharmacologic nuclear stress test done in October 2017 which was normal.  Echocardiogram in November 2017 was also normal. I added Imdur 30 mg once daily during last visit but he did not tolerate the medication due to headache. The patient is scheduled for shoulder surgery next week.   Past Medical History:  Diagnosis Date  . Allergy   . Anxiety   . Arthritis    right shoulder  . CAD (coronary artery disease)    Catheterization, 2003, occluded OM1, good collaterals  //   nuclear, August, 2009, low risk, EF 60%  . Carotid artery disease ([place])    Doppler, February, 2012, 9-67% R. ICA, 89-38% LICA,  plan followup 1 year  . COPD (chronic obstructive pulmonary disease) (HCC)    Significant Dr. Lamonte Sakai  . Diverticulosis of colon   . Ejection fraction    EF 60%, nuclear, 2009  . GERD (gastroesophageal reflux disease)   . Gout   . Hyperlipidemia   . Hypertension   . Prostatitis   . Sleep apnea    uses cpap    Past Surgical History:  Procedure Laterality  Date  . COLONOSCOPY  12/2003   patterson - polyp  . deviated septum    . KNEE ARTHROSCOPY     right  . WISDOM TOOTH EXTRACTION       Current Outpatient Medications  Medication Sig Dispense Refill  . albuterol (PROVENTIL HFA;VENTOLIN HFA) 108 (90 Base) MCG/ACT inhaler TAKE 2 PUFFS BY MOUTH EVERY 6 HOURS AS NEEDED FOR WHEEZE 18 Inhaler 1  . aspirin 81 MG tablet Take 81 mg by mouth daily.      [name] atorvastatin (LIPITOR) 40 MG tablet Take 1 tablet (40 mg total) by mouth daily. 90 tablet 1  . metoprolol tartrate (LOPRESSOR) 50 MG tablet TAKE 1 TABLET BY MOUTH TWICE A DAY (Patient taking differently: Take 50 mg by mouth daily. ) 60 tablet 5  . Multiple Vitamin (MULTIVITAMIN) capsule Take 1 capsule by mouth daily.      . predniSONE (DELTASONE) 20 MG tablet 2 tabs by mouth x 3 days, 1 tab by mouth x 3 days, 1/2 tab by mouth x 3 days 12 tablet 0  . sertraline (ZOLOFT) 50 MG tablet TAKE 1 TABLET BY MOUTH EVERY DAY 90 tablet 1  . TRELEGY ELLIPTA 100-62.5-25 MCG/INH AEPB USE 1 PUFF DAILY AS DIRECTED (Patient taking differently: Inhale 1 puff into the lungs daily. ) 60 each 5  . vitamin B-12 (CYANOCOBALAMIN) 1000 MCG tablet Take 1,000 mcg by  mouth daily.     No current facility-administered medications for this visit.     Allergies:   Avelox [moxifloxacin hcl in nacl]; Sulfamethoxazole-trimethoprim; and Aspirin    Social History:  The patient  reports that he quit smoking about 18 years ago. His smoking use included cigarettes. He has a 64.00 pack-year smoking history. He has quit using smokeless tobacco. He reports that he does not drink alcohol or use drugs.   Family History:  The patient's family history includes Angina in his mother; Asthma in his father; Crohn's disease in his sister.    ROS:  Please see the history of present illness.   Otherwise, review of systems are positive for none.   All other systems are reviewed and negative.    PHYSICAL EXAM: VS:  BP 108/60 (BP Location: Left  Arm, Patient Position: Sitting, Cuff Size: Normal)   Pulse 64   Ht 5\' 11"  (1.803 m)   Wt 233 lb 12 oz (106 kg)   BMI 32.60 kg/m  , BMI Body mass index is 32.6 kg/m. GEN: Well nourished, well developed, in no acute distress  HEENT: normal  Neck: no JVD, carotid bruits, or masses Cardiac: RRR; no  rubs, or gallops,no edema .  2 out of 6 holosystolic murmur at the apex. Respiratory:  clear to auscultation bilaterally, normal work of breathing GI: soft, nontender, nondistended, + BS MS: no deformity or atrophy  Skin: warm and dry, no rash Neuro:  Strength and sensation are intact Psych: euthymic mood, full affect   EKG:  EKG is ordered today. The ekg ordered today demonstrates normal sinus rhythm with no significant ST or T wave changes.   Recent Labs: 01/21/2018: ALT 49 01/26/2018: BUN 15; Creatinine, Ser 1.00; Hemoglobin 15.5; Platelets 223; Potassium 3.6; Sodium 143    Lipid Panel    Component Value Date/Time   CHOL 142 04/02/2017 0938   TRIG 158.0 (H) 04/02/2017 0938   HDL 40.50 04/02/2017 0938   CHOLHDL 4 04/02/2017 0938   VLDL 31.6 04/02/2017 0938   LDLCALC 70 04/02/2017 0938   LDLDIRECT 71.0 01/03/2016 1201      Wt Readings from Last 3 Encounters:  01/30/18 233 lb 12 oz (106 kg)  01/26/18 228 lb 1.6 oz (103.5 kg)  01/21/18 230 lb 3.2 oz (104.4 kg)       PAD Screen 01/17/2016  Previous PAD dx? No  Previous surgical procedure? No  Pain with walking? Yes  Subsides with rest? Yes  Feet/toe relief with dangling? No  Painful, non-healing ulcers? No  Extremities discolored? No      ASSESSMENT AND PLAN:  1.  Coronary artery disease involving native coronary arteries with stable class II angina: The patient's symptoms have been stable overall.  He had a nuclear stress test in 2017 which was normal.  His EKG today is normal.  Continue medical therapy.  2.  Hyperlipidemia: Continue treatment with atorvastatin.  I reviewed most recent lipid profile in May which  showed an LDL of 70 which is at target.  3.  Essential hypertension: Blood pressure is controlled on metoprolol.  4.  Preoperative cardiovascular evaluation for shoulder surgery: The patient cardiac symptoms have been stable for many years and his EKG is normal.  The patient is he is considered at low risk for cardiovascular complications.  No need for stress test before surgery.  5.  Cardiac murmur: This was not described before.  I will obtain an echocardiogram which can be done after his shoulder surgery.  Disposition:   FU with me in 12 months  Signed,   [name], MD  01/30/2018 3:32 PM    [facility]

## 2018-02-02 NOTE — H&P (Signed)
Hayden Deleon is an 62 y.o. male.   Chief Complaint: Right shoulder pain HPI: Hayden Deleon is a 61 year old patient with right shoulder pain.  He describes significant pain with activities of daily living.  Is been worse over the past several months.  Injection and night pain and rest pain along with restriction of motion.  He presents now for operative management after explanation of risks and benefits  Past Medical History:  Diagnosis Date  . Allergy   . Anxiety   . Arthritis    right shoulder  . CAD (coronary artery disease)    Catheterization, 2003, occluded OM1, good collaterals  //   nuclear, August, 2009, low risk, EF 60%  . Carotid artery disease ([place])    Doppler, February, 2012, 4-78% R. ICA, 29-56% LICA,  plan followup 1 year  . COPD (chronic obstructive pulmonary disease) (HCC)    Significant Dr. Lamonte Sakai  . Diverticulosis of colon   . Ejection fraction    EF 60%, nuclear, 2009  . GERD (gastroesophageal reflux disease)   . Gout   . Hyperlipidemia   . Hypertension   . Prostatitis   . Sleep apnea    uses cpap    Past Surgical History:  Procedure Laterality Date  . COLONOSCOPY  12/2003   patterson - polyp  . deviated septum    . KNEE ARTHROSCOPY     right  . WISDOM TOOTH EXTRACTION      Family History  Problem Relation Age of Onset  . Angina Mother        car accident  . Asthma Father   . Crohn's disease Sister   . Colon cancer Neg Hx   . Esophageal cancer Neg Hx   . Rectal cancer Neg Hx   . Stomach cancer Neg Hx    Social History:  reports that he quit smoking about 18 years ago. His smoking use included cigarettes. He has a 64.00 pack-year smoking history. He has quit using smokeless tobacco. He reports that he does not drink alcohol or use drugs.  Allergies:  Allergies  Allergen Reactions  . Avelox [Moxifloxacin Hcl In Nacl] Other (See Comments)    Facial swelling  . Sulfamethoxazole-Trimethoprim Other (See Comments)    REACTION: sore mouth  . Aspirin  Other (See Comments)    REACTION: Nausea; fine with 81mg     No medications prior to admission.    No results found for this or any previous visit (from the past 48 hour(s)). No results found.  Review of Systems  Musculoskeletal: Positive for joint pain.  All other systems reviewed and are negative.   There were no vitals taken for this visit. Physical Exam  Constitutional: He appears well-developed.  HENT:  Head: Normocephalic.  Eyes: Pupils are equal, round, and reactive to light.  Neck: Normal range of motion.  Cardiovascular: Normal rate.  Respiratory: Effort normal.  Neurological: He is alert.  Skin: Skin is warm.  Psychiatric: He has a normal mood and affect.  Examination of the right shoulder demonstrates restricted external rotation at 15 degrees of abduction to about 15 to 20 degrees.  Forward flexion isolated glenohumeral abduction also less than or equal to 90 degrees.  Cuff strength seems to be intact infraspinatus supraspinatus muscle testing slightly weaker to subscap testing.  Radial pulses intact.  Deltoid is functional  Assessment/Plan Impression is right shoulder glenohumeral arthritis with large inferior humeral head osteophyte and multiple loose bodies within the joint.  Plan is shoulder replacement either anatomic total  shoulder replacement versus reverse shoulder replacement.  He is a little bit on the young side for reverse shoulder replacement but he has medical comorbidities.  Will wait to assess the rotator cuff before deciding on reverse replacement versus total shoulder replacement.  Both options will be available.  The risk and benefits of surgery are discussed with the patient including but not limited to infection nerve vessel damage dislocation as well as potential need for revision.  All questions answered.  Anderson Malta, MD 02/02/2018, 11:10 PM

## 2018-02-03 ENCOUNTER — Encounter (HOSPITAL_COMMUNITY)
Admission: RE | Disposition: A | Discharge status: Home / Self Care | Payer: Self-pay | Source: Home / Self Care | Attending: Orthopedic Surgery

## 2018-02-03 ENCOUNTER — Inpatient Hospital Stay (HOSPITAL_COMMUNITY): Payer: BLUE CROSS/BLUE SHIELD | Admitting: Physician Assistant

## 2018-02-03 ENCOUNTER — Encounter (HOSPITAL_COMMUNITY): Payer: Self-pay | Admitting: Urology

## 2018-02-03 ENCOUNTER — Inpatient Hospital Stay (HOSPITAL_COMMUNITY): Payer: BLUE CROSS/BLUE SHIELD | Admitting: Anesthesiology

## 2018-02-03 ENCOUNTER — Inpatient Hospital Stay (HOSPITAL_COMMUNITY)
Admission: RE | Admit: 2018-02-03 | Discharge: 2018-02-04 | DRG: 483 | Disposition: A | Discharge status: Home / Self Care | Payer: BLUE CROSS/BLUE SHIELD | Attending: Orthopedic Surgery | Admitting: Orthopedic Surgery

## 2018-02-03 ENCOUNTER — Other Ambulatory Visit: Payer: Self-pay

## 2018-02-03 ENCOUNTER — Inpatient Hospital Stay (HOSPITAL_COMMUNITY): Payer: BLUE CROSS/BLUE SHIELD

## 2018-02-03 DIAGNOSIS — K089 Disorder of teeth and supporting structures, unspecified: Secondary | ICD-10-CM | POA: Diagnosis not present

## 2018-02-03 DIAGNOSIS — G4730 Sleep apnea, unspecified: Secondary | ICD-10-CM | POA: Diagnosis not present

## 2018-02-03 DIAGNOSIS — E785 Hyperlipidemia, unspecified: Secondary | ICD-10-CM | POA: Diagnosis present

## 2018-02-03 DIAGNOSIS — Z888 Allergy status to other drugs, medicaments and biological substances status: Secondary | ICD-10-CM

## 2018-02-03 DIAGNOSIS — K5730 Diverticulosis of large intestine without perforation or abscess without bleeding: Secondary | ICD-10-CM | POA: Diagnosis present

## 2018-02-03 DIAGNOSIS — I2510 Atherosclerotic heart disease of native coronary artery without angina pectoris: Secondary | ICD-10-CM | POA: Diagnosis present

## 2018-02-03 DIAGNOSIS — M19011 Primary osteoarthritis, right shoulder: Secondary | ICD-10-CM | POA: Diagnosis not present

## 2018-02-03 DIAGNOSIS — Z882 Allergy status to sulfonamides status: Secondary | ICD-10-CM | POA: Diagnosis not present

## 2018-02-03 DIAGNOSIS — I10 Essential (primary) hypertension: Secondary | ICD-10-CM | POA: Diagnosis not present

## 2018-02-03 DIAGNOSIS — J449 Chronic obstructive pulmonary disease, unspecified: Secondary | ICD-10-CM | POA: Diagnosis present

## 2018-02-03 DIAGNOSIS — Z87891 Personal history of nicotine dependence: Secondary | ICD-10-CM | POA: Diagnosis not present

## 2018-02-03 DIAGNOSIS — Z886 Allergy status to analgesic agent status: Secondary | ICD-10-CM | POA: Diagnosis not present

## 2018-02-03 DIAGNOSIS — M25511 Pain in right shoulder: Secondary | ICD-10-CM | POA: Diagnosis not present

## 2018-02-03 DIAGNOSIS — M19019 Primary osteoarthritis, unspecified shoulder: Secondary | ICD-10-CM | POA: Diagnosis present

## 2018-02-03 DIAGNOSIS — K219 Gastro-esophageal reflux disease without esophagitis: Secondary | ICD-10-CM | POA: Diagnosis present

## 2018-02-03 DIAGNOSIS — G8918 Other acute postprocedural pain: Secondary | ICD-10-CM | POA: Diagnosis not present

## 2018-02-03 DIAGNOSIS — Z8379 Family history of other diseases of the digestive system: Secondary | ICD-10-CM | POA: Diagnosis not present

## 2018-02-03 DIAGNOSIS — Z825 Family history of asthma and other chronic lower respiratory diseases: Secondary | ICD-10-CM | POA: Diagnosis not present

## 2018-02-03 DIAGNOSIS — Z471 Aftercare following joint replacement surgery: Secondary | ICD-10-CM | POA: Diagnosis not present

## 2018-02-03 DIAGNOSIS — Z96611 Presence of right artificial shoulder joint: Secondary | ICD-10-CM | POA: Diagnosis not present

## 2018-02-03 HISTORY — PX: TOTAL SHOULDER ARTHROPLASTY: SHX126

## 2018-02-03 SURGERY — ARTHROPLASTY, SHOULDER, TOTAL
Anesthesia: General | Site: Shoulder | Laterality: Right

## 2018-02-03 MED ORDER — MENTHOL 3 MG MT LOZG: 1.0000 | LOZENGE | OROMUCOSAL | Status: DC | PRN

## 2018-02-03 MED ORDER — LACTATED RINGERS IV SOLN
INTRAVENOUS | Status: DC
  Administered 2018-02-03: 11:00:00 via INTRAVENOUS

## 2018-02-03 MED ORDER — FLUTICASONE-UMECLIDIN-VILANT 100-62.5-25 MCG/INH IN AEPB: 1.0000 | INHALATION_SPRAY | Freq: Every day | RESPIRATORY_TRACT | Status: DC

## 2018-02-03 MED ORDER — ACETAMINOPHEN 325 MG PO TABS: 325.0000 mg | ORAL_TABLET | Freq: Four times a day (QID) | ORAL | Status: DC | PRN

## 2018-02-03 MED ORDER — FENTANYL CITRATE (PF) 100 MCG/2ML IJ SOLN
INTRAMUSCULAR | Status: AC
  Administered 2018-02-03: 100 ug via INTRAVENOUS
  Filled 2018-02-03: qty 2

## 2018-02-03 MED ORDER — LACTATED RINGERS IV SOLN
INTRAVENOUS | Status: DC
  Administered 2018-02-03: 20:00:00 via INTRAVENOUS

## 2018-02-03 MED ORDER — ADULT MULTIVITAMIN W/MINERALS CH
1.0000 | ORAL_TABLET | Freq: Every day | ORAL | Status: DC
  Administered 2018-02-04: 1 via ORAL
  Filled 2018-02-03: qty 1

## 2018-02-03 MED ORDER — ONDANSETRON HCL 4 MG/2ML IJ SOLN: 4.0000 mg | Freq: Four times a day (QID) | INTRAMUSCULAR | Status: DC | PRN

## 2018-02-03 MED ORDER — METOCLOPRAMIDE HCL 5 MG PO TABS: 5.0000 mg | ORAL_TABLET | Freq: Three times a day (TID) | ORAL | Status: DC | PRN

## 2018-02-03 MED ORDER — SUGAMMADEX SODIUM 200 MG/2ML IV SOLN
INTRAVENOUS | Status: DC | PRN
  Administered 2018-02-03: 200 mg via INTRAVENOUS

## 2018-02-03 MED ORDER — LIDOCAINE HCL (CARDIAC) PF 100 MG/5ML IV SOSY
PREFILLED_SYRINGE | INTRAVENOUS | Status: DC | PRN
  Administered 2018-02-03: 100 mg via INTRAVENOUS

## 2018-02-03 MED ORDER — EPHEDRINE SULFATE 50 MG/ML IJ SOLN
INTRAMUSCULAR | Status: DC | PRN
  Administered 2018-02-03 (×3): 10 mg via INTRAVENOUS
  Administered 2018-02-03: 5 mg via INTRAVENOUS

## 2018-02-03 MED ORDER — ALBUMIN HUMAN 5 % IV SOLN
12.5000 g | Freq: Once | INTRAVENOUS | Status: AC
  Administered 2018-02-03: 12.5 g via INTRAVENOUS

## 2018-02-03 MED ORDER — FLUTICASONE FUROATE-VILANTEROL 100-25 MCG/INH IN AEPB
1.0000 | INHALATION_SPRAY | Freq: Every day | RESPIRATORY_TRACT | Status: DC
  Administered 2018-02-04: 1 via RESPIRATORY_TRACT
  Filled 2018-02-03: qty 28

## 2018-02-03 MED ORDER — OXYCODONE HCL 5 MG PO TABS: 5.0000 mg | ORAL_TABLET | ORAL | Status: DC | PRN

## 2018-02-03 MED ORDER — LACTATED RINGERS IV SOLN
INTRAVENOUS | Status: DC | PRN
  Administered 2018-02-03 (×3): via INTRAVENOUS

## 2018-02-03 MED ORDER — BUPIVACAINE LIPOSOME 1.3 % IJ SUSP
INTRAMUSCULAR | Status: DC | PRN
  Administered 2018-02-03: 10 mL

## 2018-02-03 MED ORDER — CHLORHEXIDINE GLUCONATE 4 % EX LIQD: 60.0000 mL | Freq: Once | CUTANEOUS | Status: DC

## 2018-02-03 MED ORDER — FENTANYL CITRATE (PF) 250 MCG/5ML IJ SOLN
INTRAMUSCULAR | Status: AC
  Filled 2018-02-03: qty 5

## 2018-02-03 MED ORDER — MIDAZOLAM HCL 2 MG/2ML IJ SOLN
INTRAMUSCULAR | Status: AC
  Administered 2018-02-03: 2 mg via INTRAVENOUS
  Filled 2018-02-03: qty 2

## 2018-02-03 MED ORDER — VANCOMYCIN HCL 1000 MG IV SOLR
INTRAVENOUS | Status: AC
  Filled 2018-02-03: qty 1000

## 2018-02-03 MED ORDER — SERTRALINE HCL 50 MG PO TABS
50.0000 mg | ORAL_TABLET | Freq: Every day | ORAL | Status: DC
  Administered 2018-02-04: 50 mg via ORAL
  Filled 2018-02-03: qty 1

## 2018-02-03 MED ORDER — FENTANYL CITRATE (PF) 100 MCG/2ML IJ SOLN
100.0000 ug | Freq: Once | INTRAMUSCULAR | Status: AC
  Administered 2018-02-03: 100 ug via INTRAVENOUS

## 2018-02-03 MED ORDER — ATORVASTATIN CALCIUM 40 MG PO TABS
40.0000 mg | ORAL_TABLET | Freq: Every day | ORAL | Status: DC
  Administered 2018-02-04: 40 mg via ORAL
  Filled 2018-02-03: qty 1

## 2018-02-03 MED ORDER — ASPIRIN EC 81 MG PO TBEC
81.0000 mg | DELAYED_RELEASE_TABLET | Freq: Every day | ORAL | Status: DC
  Administered 2018-02-04: 81 mg via ORAL
  Filled 2018-02-03: qty 1

## 2018-02-03 MED ORDER — LACTATED RINGERS IV SOLN
INTRAVENOUS | Status: DC
  Administered 2018-02-03: 18:00:00 via INTRAVENOUS

## 2018-02-03 MED ORDER — PROPOFOL 10 MG/ML IV BOLUS
INTRAVENOUS | Status: AC
  Filled 2018-02-03: qty 20

## 2018-02-03 MED ORDER — CEFAZOLIN SODIUM-DEXTROSE 2-4 GM/100ML-% IV SOLN
2.0000 g | Freq: Four times a day (QID) | INTRAVENOUS | Status: AC
  Administered 2018-02-03 – 2018-02-04 (×3): 2 g via INTRAVENOUS
  Filled 2018-02-03 (×3): qty 100

## 2018-02-03 MED ORDER — GLYCOPYRROLATE 0.2 MG/ML IJ SOLN
INTRAMUSCULAR | Status: DC | PRN
  Administered 2018-02-03: 0.1 mg via INTRAVENOUS
  Administered 2018-02-03: 0.2 mg via INTRAVENOUS
  Administered 2018-02-03: 0.1 mg via INTRAVENOUS

## 2018-02-03 MED ORDER — ONDANSETRON HCL 4 MG PO TABS: 4.0000 mg | ORAL_TABLET | Freq: Four times a day (QID) | ORAL | Status: DC | PRN

## 2018-02-03 MED ORDER — UMECLIDINIUM BROMIDE 62.5 MCG/INH IN AEPB
1.0000 | INHALATION_SPRAY | Freq: Every day | RESPIRATORY_TRACT | Status: DC
  Administered 2018-02-04: 1 via RESPIRATORY_TRACT
  Filled 2018-02-03: qty 7

## 2018-02-03 MED ORDER — DOCUSATE SODIUM 100 MG PO CAPS
100.0000 mg | ORAL_CAPSULE | Freq: Two times a day (BID) | ORAL | Status: DC
  Administered 2018-02-04: 100 mg via ORAL
  Filled 2018-02-03: qty 1

## 2018-02-03 MED ORDER — PHENOL 1.4 % MT LIQD: 1.0000 | OROMUCOSAL | Status: DC | PRN

## 2018-02-03 MED ORDER — TRAMADOL HCL 50 MG PO TABS
50.0000 mg | ORAL_TABLET | Freq: Four times a day (QID) | ORAL | Status: DC
  Administered 2018-02-04: 50 mg via ORAL
  Filled 2018-02-03: qty 1

## 2018-02-03 MED ORDER — FENTANYL CITRATE (PF) 100 MCG/2ML IJ SOLN: 25.0000 ug | INTRAMUSCULAR | Status: DC | PRN

## 2018-02-03 MED ORDER — METHOCARBAMOL 500 MG PO TABS: 500.0000 mg | ORAL_TABLET | Freq: Four times a day (QID) | ORAL | Status: DC | PRN

## 2018-02-03 MED ORDER — ALBUTEROL SULFATE (2.5 MG/3ML) 0.083% IN NEBU: 2.5000 mg | INHALATION_SOLUTION | Freq: Four times a day (QID) | RESPIRATORY_TRACT | Status: DC | PRN

## 2018-02-03 MED ORDER — BUPIVACAINE HCL (PF) 0.5 % IJ SOLN
INTRAMUSCULAR | Status: DC | PRN
  Administered 2018-02-03: 15 mL

## 2018-02-03 MED ORDER — METHOCARBAMOL 1000 MG/10ML IJ SOLN
500.0000 mg | Freq: Four times a day (QID) | INTRAVENOUS | Status: DC | PRN
  Filled 2018-02-03: qty 5

## 2018-02-03 MED ORDER — FENTANYL CITRATE (PF) 100 MCG/2ML IJ SOLN
INTRAMUSCULAR | Status: DC | PRN
  Administered 2018-02-03: 50 ug via INTRAVENOUS
  Administered 2018-02-03: 100 ug via INTRAVENOUS

## 2018-02-03 MED ORDER — ALBUMIN HUMAN 5 % IV SOLN
INTRAVENOUS | Status: AC
  Filled 2018-02-03: qty 250

## 2018-02-03 MED ORDER — SODIUM CHLORIDE 0.9 % IV SOLN
INTRAVENOUS | Status: DC | PRN
  Administered 2018-02-03: 60 ug/min via INTRAVENOUS

## 2018-02-03 MED ORDER — LACTATED RINGERS IV BOLUS: 250.0000 mL | Freq: Once | INTRAVENOUS | Status: DC

## 2018-02-03 MED ORDER — VITAMIN B-12 1000 MCG PO TABS
1000.0000 ug | ORAL_TABLET | Freq: Every day | ORAL | Status: DC
  Administered 2018-02-04: 1000 ug via ORAL
  Filled 2018-02-03: qty 1

## 2018-02-03 MED ORDER — MIDAZOLAM HCL 2 MG/2ML IJ SOLN
2.0000 mg | Freq: Once | INTRAMUSCULAR | Status: AC
  Administered 2018-02-03: 2 mg via INTRAVENOUS

## 2018-02-03 MED ORDER — CEFAZOLIN SODIUM-DEXTROSE 2-4 GM/100ML-% IV SOLN
2.0000 g | INTRAVENOUS | Status: AC
  Administered 2018-02-03 (×2): 2 g via INTRAVENOUS

## 2018-02-03 MED ORDER — 0.9 % SODIUM CHLORIDE (POUR BTL) OPTIME
TOPICAL | Status: DC | PRN
  Administered 2018-02-03: 1000 mL
  Administered 2018-02-03: 6000 mL

## 2018-02-03 MED ORDER — MEPERIDINE HCL 50 MG/ML IJ SOLN: 6.2500 mg | INTRAMUSCULAR | Status: DC | PRN

## 2018-02-03 MED ORDER — METOCLOPRAMIDE HCL 5 MG/ML IJ SOLN: 5.0000 mg | Freq: Three times a day (TID) | INTRAMUSCULAR | Status: DC | PRN

## 2018-02-03 MED ORDER — PROPOFOL 10 MG/ML IV BOLUS
INTRAVENOUS | Status: DC | PRN
  Administered 2018-02-03: 200 mg via INTRAVENOUS

## 2018-02-03 MED ORDER — METOCLOPRAMIDE HCL 5 MG/ML IJ SOLN
INTRAMUSCULAR | Status: AC
  Filled 2018-02-03: qty 2

## 2018-02-03 MED ORDER — METOCLOPRAMIDE HCL 5 MG/ML IJ SOLN: 10.0000 mg | Freq: Once | INTRAMUSCULAR | Status: DC | PRN

## 2018-02-03 MED ORDER — CEFAZOLIN SODIUM-DEXTROSE 2-4 GM/100ML-% IV SOLN
INTRAVENOUS | Status: AC
  Filled 2018-02-03: qty 100

## 2018-02-03 MED ORDER — ROCURONIUM BROMIDE 100 MG/10ML IV SOLN
INTRAVENOUS | Status: DC | PRN
  Administered 2018-02-03: 50 mg via INTRAVENOUS

## 2018-02-03 MED ORDER — METOPROLOL TARTRATE 50 MG PO TABS
50.0000 mg | ORAL_TABLET | Freq: Every day | ORAL | Status: DC
  Administered 2018-02-04: 50 mg via ORAL
  Filled 2018-02-03: qty 1

## 2018-02-03 MED ORDER — HYDROMORPHONE HCL 1 MG/ML IJ SOLN: 0.5000 mg | INTRAMUSCULAR | Status: DC | PRN

## 2018-02-03 SURGICAL SUPPLY — 75 items
BASEPLATE AUG MED W-TAPER (Plate) ×2 IMPLANT
BEARING HUMERAL 40 STD VITE (Joint) ×2 IMPLANT
BIT DRILL 2.7 W/STOP DISP (BIT) ×2 IMPLANT
BIT DRILL TWIST 2.7 (BIT) ×2 IMPLANT
BLADE SAW SGTL 13X75X1.27 (BLADE) ×2 IMPLANT
BRNG HUM STD 40 RVRS SHLDR (Joint) ×1 IMPLANT
CHLORAPREP W/TINT 26ML (MISCELLANEOUS) ×4 IMPLANT
CLSR STERI-STRIP ANTIMIC 1/2X4 (GAUZE/BANDAGES/DRESSINGS) ×2 IMPLANT
COVER SURGICAL LIGHT HANDLE (MISCELLANEOUS) ×2 IMPLANT
COVER WAND RF STERILE (DRAPES) ×2 IMPLANT
DIAL VERSA SHOULDER 40 STD (Joint) ×2 IMPLANT
DRAPE INCISE IOBAN 66X45 STRL (DRAPES) ×2 IMPLANT
DRAPE U-SHAPE 47X51 STRL (DRAPES) ×4 IMPLANT
DRSG AQUACEL AG ADV 3.5X10 (GAUZE/BANDAGES/DRESSINGS) ×2 IMPLANT
ELECT BLADE 4.0 EZ CLEAN MEGAD (MISCELLANEOUS)
ELECT REM PT RETURN 9FT ADLT (ELECTROSURGICAL) ×2
ELECTRODE BLDE 4.0 EZ CLN MEGD (MISCELLANEOUS) IMPLANT
ELECTRODE REM PT RTRN 9FT ADLT (ELECTROSURGICAL) ×1 IMPLANT
GAUZE SPONGE 4X4 12PLY STRL LF (GAUZE/BANDAGES/DRESSINGS) ×2 IMPLANT
GLOVE BIOGEL PI IND STRL 7.5 (GLOVE) ×1 IMPLANT
GLOVE BIOGEL PI IND STRL 8 (GLOVE) ×1 IMPLANT
GLOVE BIOGEL PI INDICATOR 7.5 (GLOVE) ×1
GLOVE BIOGEL PI INDICATOR 8 (GLOVE) ×1
GLOVE ECLIPSE 7.0 STRL STRAW (GLOVE) ×2 IMPLANT
GLOVE SURG ORTHO 8.0 STRL STRW (GLOVE) ×2 IMPLANT
GOWN STRL REUS W/ TWL LRG LVL3 (GOWN DISPOSABLE) ×2 IMPLANT
GOWN STRL REUS W/ TWL XL LVL3 (GOWN DISPOSABLE) ×1 IMPLANT
GOWN STRL REUS W/TWL LRG LVL3 (GOWN DISPOSABLE) ×2
GOWN STRL REUS W/TWL XL LVL3 (GOWN DISPOSABLE) ×2
KIT BASIN OR (CUSTOM PROCEDURE TRAY) ×2 IMPLANT
KIT TURNOVER KIT B (KITS) ×2 IMPLANT
LOOP VESSEL MAXI BLUE (MISCELLANEOUS) ×2 IMPLANT
MANIFOLD NEPTUNE II (INSTRUMENTS) ×2 IMPLANT
MODEL GLENOID TOTAL SIGNATURE (SYSTAGENIX WOUND MANAGEMENT) ×2 IMPLANT
NDL SUT 6 .5 CRC .975X.05 MAYO (NEEDLE) ×1 IMPLANT
NEEDLE HYPO 25GX1X1/2 BEV (NEEDLE) IMPLANT
NEEDLE MAYO TAPER (NEEDLE) ×2
NS IRRIG 1000ML POUR BTL (IV SOLUTION) ×2 IMPLANT
PACK SHOULDER (CUSTOM PROCEDURE TRAY) ×2 IMPLANT
PAD ARMBOARD 7.5X6 YLW CONV (MISCELLANEOUS) ×4 IMPLANT
PIN THREADED REVERSE (PIN) ×2 IMPLANT
REAMER GUIDE BUSHING SURG DISP (MISCELLANEOUS) ×2 IMPLANT
REAMER GUIDE W/SCREW AUG (MISCELLANEOUS) ×2 IMPLANT
RESTRAINT HEAD UNIVERSAL NS (MISCELLANEOUS) ×2 IMPLANT
RETRIEVER SUT HEWSON (MISCELLANEOUS) ×2 IMPLANT
SCREW BONE CORT 6.5X35MM (Screw) ×2 IMPLANT
SCREW BONE LOCKING 4.75X30X3.5 (Screw) ×4 IMPLANT
SCREW LOCKING NS 4.75MMX20MM (Screw) ×2 IMPLANT
SCREW LOCKING STRL 4.75X25X3.5 (Screw) ×2 IMPLANT
SLING ARM IMMOBILIZER LRG (SOFTGOODS) ×2 IMPLANT
SPONGE LAP 18X18 X RAY DECT (DISPOSABLE) ×2 IMPLANT
STEM HUMERAL STRL 16MMX83MM (Stem) ×2 IMPLANT
STRIP CLOSURE SKIN 1/2X4 (GAUZE/BANDAGES/DRESSINGS) ×2 IMPLANT
SUCTION FRAZIER HANDLE 10FR (MISCELLANEOUS) ×1
SUCTION TUBE FRAZIER 10FR DISP (MISCELLANEOUS) ×1 IMPLANT
SUT BROADBAND TAPE 2PK 1.5 (SUTURE) ×6 IMPLANT
SUT FIBERWIRE #2 38 T-5 BLUE (SUTURE) ×2
SUT MAXBRAID (SUTURE) IMPLANT
SUT MNCRL AB 3-0 PS2 18 (SUTURE) ×2 IMPLANT
SUT VIC AB 0 CT1 27 (SUTURE) ×2
SUT VIC AB 0 CT1 27XBRD ANBCTR (SUTURE) ×2 IMPLANT
SUT VIC AB 1 CT1 27 (SUTURE) ×1
SUT VIC AB 1 CT1 27XBRD ANBCTR (SUTURE) ×1 IMPLANT
SUT VIC AB 2-0 CT1 27 (SUTURE) ×4
SUT VIC AB 2-0 CT1 TAPERPNT 27 (SUTURE) ×2 IMPLANT
SUT VICRYL 0 UR6 27IN ABS (SUTURE) ×4 IMPLANT
SUTURE FIBERWR #2 38 T-5 BLUE (SUTURE) ×1 IMPLANT
SUTURE TAPE 1.3 40 TPR END (SUTURE) IMPLANT
SUTURETAPE 1.3 40 TPR END (SUTURE)
SYR CONTROL 10ML LL (SYRINGE) IMPLANT
TOWEL OR 17X24 6PK STRL BLUE (TOWEL DISPOSABLE) ×2 IMPLANT
TOWEL OR 17X26 10 PK STRL BLUE (TOWEL DISPOSABLE) ×2 IMPLANT
TRAY FOLEY BAG SILVER LF 16FR (CATHETERS) IMPLANT
TRAY HUM MINI SHOULDER +0 40D (Shoulder) ×2 IMPLANT
WATER STERILE IRR 1000ML POUR (IV SOLUTION) ×2 IMPLANT

## 2018-02-03 NOTE — Brief Op Note (Signed)
02/03/2018  5:40 PM  PATIENT:  [name]  62 y.o. male  PRE-OPERATIVE DIAGNOSIS:  right shoulder osteoarthritis  POST-OPERATIVE DIAGNOSIS:  right shoulder osteoarthritis  PROCEDURE:  Procedure(s): RIGHT REVERSE SHOULDER ARTHROPLASTY  SURGEON:  Surgeon(s): Marlou Sa, Tonna Corner, MD  ASSISTANT: Laure Kidney rnfa  ANESTHESIA:   general  EBL: 350 ml    Total I/O In: 2000 [I.V.:2000] Out: 400 [Blood:400]  BLOOD ADMINISTERED: none  DRAINS: none   LOCAL MEDICATIONS USED:  none  SPECIMEN:  No Specimen  COUNTS:  YES  TOURNIQUET:  * No tourniquets in log *  DICTATION: .Other Dictation: Dictation Number done  PLAN OF CARE: Admit to inpatient   PATIENT DISPOSITION:  PACU - hemodynamically stable

## 2018-02-03 NOTE — Interval H&P Note (Signed)
History and Physical Interval Note:  02/03/2018 11:11 AM  Hayden Deleon  has presented today for surgery, with the diagnosis of right shoulder osteoarthritis  The various methods of treatment have been discussed with the patient and family. After consideration of risks, benefits and other options for treatment, the patient has consented to  Procedure(s): RIGHT SHOULDER REPLACEMENT (Right) as a surgical intervention .  The patient's history has been reviewed, patient examined, no change in status, stable for surgery.  I have reviewed the patient's chart and labs.  Questions were answered to the patient's satisfaction.     Anderson Malta

## 2018-02-03 NOTE — Anesthesia Procedure Notes (Signed)
Anesthesia Regional Block: Supraclavicular block   Pre-Anesthetic Checklist: ,, timeout performed, Correct Patient, Correct Site, Correct Laterality, Correct Procedure, Correct Position, site marked, Risks and benefits discussed,  Surgical consent,  Pre-op evaluation,  At surgeon's request and post-op pain management  Laterality: Right and Upper  Prep: Maximum Sterile Barrier Precautions used, chloraprep       Needles:  Injection technique: Single-shot  Needle Type: Echogenic Stimulator Needle     Needle Length: 10cm      Additional Needles:   Procedures:,,,, ultrasound used (permanent image in chart),,,,  Narrative:  Start time: 02/03/2018 11:48 AM End time: 02/03/2018 11:53 AM Injection made incrementally with aspirations every 5 mL.  Performed by: Personally  Anesthesiologist: Montez Hageman, MD  Additional Notes: Risks, benefits and alternative to block explained extensively.  Patient tolerated procedure well, without complications.

## 2018-02-03 NOTE — Progress Notes (Signed)
SBP in the 80's, NSR 60's, pt awake,alert, no c/o. Dr Jenita Seashore fully updated-new order for IV Albumin. Will continue to monitor.

## 2018-02-03 NOTE — Anesthesia Procedure Notes (Signed)
Procedure Name: Intubation Date/Time: 02/03/2018 12:21 PM Performed by: Tyheim Vanalstyne T, CRNA Pre-anesthesia Checklist: Patient identified, Emergency Drugs available, Suction available and Patient being monitored Patient Re-evaluated:Patient Re-evaluated prior to induction Oxygen Delivery Method: Circle system utilized Preoxygenation: Pre-oxygenation with 100% oxygen Induction Type: IV induction Ventilation: Mask ventilation with difficulty and Oral airway inserted - appropriate to patient size Laryngoscope Size: Miller and 3 Grade View: Grade I Tube type: Oral Tube size: 7.5 mm Number of attempts: 2 Airway Equipment and Method: Patient positioned with wedge pillow and Stylet Placement Confirmation: ETT inserted through vocal cords under direct vision,  positive ETCO2 and breath sounds checked- equal and bilateral Secured at: 20 cm Tube secured with: Tape Dental Injury: Teeth and Oropharynx as per pre-operative assessment and Bloody posterior oropharynx

## 2018-02-03 NOTE — Anesthesia Postprocedure Evaluation (Signed)
Anesthesia Post Note  Patient: Hayden Deleon  Procedure(s) Performed: RIGHT REVERSE SHOULDER ARTHROPLASTY (Right Shoulder)     Patient location during evaluation: PACU Anesthesia Type: General and Regional Level of consciousness: awake and alert, oriented and patient cooperative Pain management: pain level controlled Vital Signs Assessment: post-procedure vital signs reviewed and stable Respiratory status: spontaneous breathing, nonlabored ventilation and respiratory function stable Cardiovascular status: blood pressure returned to baseline and stable Postop Assessment: no apparent nausea or vomiting (nausea resolved) Anesthetic complications: no    Last Vitals:  Vitals:   02/03/18 1945 02/03/18 1951  BP:    Pulse: 68 71  Resp: 16 16  Temp:  (!) 36.2 C  SpO2: 97% 98%    Last Pain:  Vitals:   02/03/18 1951  TempSrc:   PainSc: 0-No pain                 Eryn Marandola,E. Loetta Connelley

## 2018-02-03 NOTE — Transfer of Care (Signed)
Immediate Anesthesia Transfer of Care Note  Patient: Hayden Deleon  Procedure(s) Performed: RIGHT REVERSE SHOULDER ARTHROPLASTY (Right Shoulder)  Patient Location: PACU  Anesthesia Type:GA combined with regional for post-op pain  Level of Consciousness: awake, alert  and oriented  Airway & Oxygen Therapy: Patient Spontanous Breathing and Patient connected to nasal cannula oxygen  Post-op Assessment: Report given to RN, Post -op Vital signs reviewed and stable and Patient moving all extremities  Post vital signs: Reviewed and stable  Last Vitals:  Vitals Value Taken Time  BP 68/52 02/03/2018  5:44 PM  Temp    Pulse 70 02/03/2018  5:47 PM  Resp 13 02/03/2018  5:47 PM  SpO2 93 % 02/03/2018  5:47 PM  Vitals shown include unvalidated device data.  Last Pain:  Vitals:   02/03/18 1200  TempSrc:   PainSc: 0-No pain         Complications: No apparent anesthesia complications

## 2018-02-03 NOTE — Plan of Care (Signed)
  Problem: Pain Managment: Goal: General experience of comfort will improve Outcome: Progressing   Problem: Safety: Goal: Ability to remain free from injury will improve Outcome: Progressing   

## 2018-02-04 DIAGNOSIS — M19011 Primary osteoarthritis, right shoulder: Principal | ICD-10-CM

## 2018-02-04 NOTE — Plan of Care (Signed)

## 2018-02-04 NOTE — Op Note (Signed)
NAME: Hayden Deleon, Hayden Deleon MEDICAL RECORD [id number] ACCOUNT [id number] DATE OF BIRTH:[birth year] FACILITY: [facility] LOCATION: [facility] PHYSICIAN:GREGORY Randel Pigg, MD  OPERATIVE REPORT  DATE OF PROCEDURE:  02/03/2018  PREOPERATIVE DIAGNOSIS:  Right shoulder arthritis with a B2 glenoid.  POSTOPERATIVE DIAGNOSIS:  Right shoulder arthritis with a B2 glenoid.  PROCEDURE:  Right shoulder replacement and removal of loose bodies and removal of massive inferior humeral neck osteophyte.  SURGEON:  Meredith Pel, MD  ASSISTANT:  Laure Kidney, RNFA  INDICATIONS:  The patient is a 62 year old patient with end-stage right shoulder arthritis with type 2 glenoid over 20 degrees of retroversion.  He presents for operative management after explanation of risks and benefits.  PROCEDURE IN DETAIL:  The patient was brought to the operating room where general anesthetic was induced.  Preoperative antibiotics were administered.  Time-out was called.  Right shoulder was examined under anesthesia and found to have about 5 degrees  of external rotation at 15 degrees of abduction and less than 90 degrees of forward flexion and abduction.  The right shoulder was then prescrubbed with hydrogen peroxide, alcohol and Betadine, which was allowed to air dry, prepped with DuraPrep solution  and draped in a sterile manner.  [name] was used to cover the operative field.  Time-out was called.  Deltopectoral approach was made.  Skin and subcutaneous tissue were sharply divided.  Cephalic vein was mobilized medially.  The patient has a very  stiff shoulder.  Felt the subdeltoid space was freed of adhesions.  Rotator cuff was inspected and found to be generally intact, although there was significant deformity of the anterior portion of the shoulder due to this large humeral osteophyte.   Biceps tendon was tenodesed to the pec tendon.  The biceps tendon was then used to open the rotator interval up to the base of the coracoid.   The coracohumeral ligament was released.  At the time, the circumflex vessels were coagulated.  The axillary  nerve was then visualized and a vessel loop was placed around it.  It was protected at all times during the case.  At this time, sequential peeling back of the subscap was performed.  Capsule removal was also performed and careful removal of the  osteophyte was performed using dissection remaining on the actual osteophyte itself in order to remove soft tissue.  Blunt retractors were placed in order to create traction.  The capsule was released off of the large osteophyte.  This was removed in  pieces using an osteotome.  Following removal, the capsule was removed.  The capsule was detached to around the 5 o'clock position.  At this time, the head was dislocated.  Rotator cuff was intact.  Loose bodies were removed from the peeled subscap.   This weakened the integrity of the subscap moderately.  Subcoracoid recess loose body was also removed.  At this time, the canal was entered, and the head cut with then sequential broaching up to size 17 was performed.  At this time, the head cut was  made in approximately 30 degrees retroversion, taking it back to the attachment site but not through the attachment site of the rotator cuff.  The cap was placed.  A glenoid retractor was then placed.  Attention was then directed towards the glenoid,  which did have significant posterior wear.  At this time, a Bankart lesion was created.  Middle glenohumeral ligament was released.  Using electrocautery, the capsule was then removed off of the glenoid face circumferentially.  The posterior  capsule was  generally patulous.  Labrum was also removed.  The guide was placed and guide pin was placed.  This was in correct position visually as well as according to the model which was utilized.  Reaming was then performed.  This reaming reamed preferentially  inferiorly and anteriorly.  This was taken down about 5 mm.  Augment  was planned.  Medium augment was utilized.  The reaming for that was also performed, and we got bleeding bone around about 80% of the reamed face.  This was deemed good.  The glenoid  was then placed into position, and a central screw and 4 peripheral locking screws were placed with good purchase obtained.  The patient had very good bone quality.  A standard offset 40 mm glenosphere was then utilized.  It was placed into position.   Attention was then directed towards the humerus.  Broaching was performed up to size 16.  This gave excellent fixation.  This was done in about 30 degrees of retroversion.  Trial reduction was performed with the different base plates.  Once a stable  construct was determined, the humeral implants were removed and then placed into position with excellent stability obtained, both with forward flexion, external rotation as well as with extension and forward flexion and abduction.  At this time, thorough  irrigation was performed.  The components utilized from [employer] were the mini-humeral stem, 16 mm x 83.  There was also a mini-humeral tray standard thickness with +0 taper offset 40 mm in diameter.  The standard bearing 40 mm was utilized.  The glenoid  side was medium augmented baseplate with 1 central screw and 4 peripheral locking screws and a glenosphere 40 mm in diameter.  Excellent stability was achieved.  Thorough irrigation performed.  Vancomycin powder was placed within the joint itself.  The  subscap was reattached in about 30 degrees of external rotation using MaxBraid suture.  Rotator interval was closed with the arm in 30 degrees of external rotation as well.  Vancomycin powder was then placed in the next layer.  Deltopectoral interval was  closed using 0 Vicryl suture followed by interrupted inverted 0 Vicryl suture, 2-0 Vicryl suture and a 3-0 Monocryl.  Steri-Strips, Aquacel dressing, and a shoulder immobilizer placed.  The patient tolerated the procedure well without  immediate  complications.  He was transferred to the recovery room in stable condition.  [employer] components were used.  LN/NUANCE  D:02/03/2018 T:02/03/2018 JOB:[id number]

## 2018-02-04 NOTE — Care Management Note (Signed)
Case Management Note  Patient Details  Name: Hayden Deleon MRN: [id number] Date of Birth: May 29, 1955  Subjective/Objective:                    Action/Plan:  Spoke w patient wife and OT at bedside. No HH needs, patient will f/u w MD in 2 weeks, CPM for shoulder will be delivered to home by [employer]. Expected Discharge Date:  02/04/18               Expected Discharge Plan:  Home/Self Care  In-House Referral:     Discharge planning Services  CM Consult  Post Acute Care Choice:  Durable Medical Equipment Choice offered to:     DME Arranged:  (shoulder CPM) DME Agency:  [employer]  HH Arranged:    HH Agency:     Status of Service:  Completed, signed off  If discussed at [place] of Stay Meetings, dates discussed:    Additional Comments:  Carles Collet, RN 02/04/2018, 10:21 AM

## 2018-02-04 NOTE — Evaluation (Signed)
Occupational Therapy Evaluation Patient Details Name: Hayden Deleon MRN: [id number] DOB: 1955-05-26 Today's Date: 02/04/2018    History of Present Illness 62 y/o patient s/p RIGHT REVERSE SHOULDER ARTHROPLASTY. Pt has a past medical history of Anxiety, Arthritis, CAD, COPD, Diverticulosis of colon, Ejection fraction, GERD, Gout, Hyperlipidemia, Hypertension, Prostatitis, and Sleep apnea.    Clinical Impression   PTA Pt independent in ADL and mobility. Pt works full time, drives, active. Pt is currently max A for BUE tasks, including ADL. Block still largely in place - only able to wiggle index finger at this point. Educated patient and wife on sling management/positioning/schedule, sleeping, bathing, dressing and exercises (please see HEP below). No exercises performed at this time as block still in place - both Pt and wife verbally acknowledged understanding.   Of Note: The bandage needs to be re-done prior to discharge on the RUE - it spans the space between his chest and upper arm -negating the bandage's "waterproof" quality as there is no seal around the edge - and also this placement could potentially limit ROM. RN is aware.   Please continue therapy as ordered by MD at follow up appointment. Thank you for the opportunity to serve this patient.     Follow Up Recommendations  Follow surgeon's recommendation for DC plan and follow-up therapies;Supervision/Assistance - 24 hour(initially)    Equipment Recommendations  None recommended by OT    Recommendations for Other Services       Precautions / Restrictions Precautions Precautions: Shoulder Type of Shoulder Precautions: passive protocol Shoulder Interventions: Shoulder sling/immobilizer;At all times;Off for dressing/bathing/exercises Precaution Booklet Issued: Yes (comment) Precaution Comments: handout reviewed in full Required Braces or Orthoses: Sling Restrictions Weight Bearing Restrictions: Yes RUE Weight Bearing: Non  weight bearing      Mobility Bed Mobility Overal bed mobility: Modified Independent                Transfers Overall transfer level: Needs assistance Equipment used: None Transfers: Sit to/from Stand Sit to Stand: Supervision         General transfer comment: for safety    Balance Overall balance assessment: Independent                                         ADL either performed or assessed with clinical judgement   ADL                                         General ADL Comments: please see shoulder section below     Vision Baseline Vision/History: Wears glasses Wears Glasses: Reading only Patient Visual Report: No change from baseline Vision Assessment?: No apparent visual deficits     Perception     Praxis      Pertinent Vitals/Pain Pain Assessment: 0-10 Pain Score: 1  Pain Location: R shoulder - block still in place Pain Descriptors / Indicators: Dull Pain Intervention(s): Limited activity within patient's tolerance;Monitored during session;Repositioned;Ice applied     Hand Dominance Right   Extremity/Trunk Assessment Upper Extremity Assessment Upper Extremity Assessment: RUE deficits/detail RUE Deficits / Details: block still largely in place - deficits as anticipated post-op RUE Sensation: decreased light touch(block still in  place) RUE Coordination: decreased fine motor;decreased gross motor   Lower Extremity Assessment Lower Extremity Assessment: Overall WFL for tasks  assessed   Cervical / Trunk Assessment Cervical / Trunk Assessment: Normal   Communication Communication Communication: No difficulties   Cognition Arousal/Alertness: Awake/alert Behavior During Therapy: WFL for tasks assessed/performed Overall Cognitive Status: Within Functional Limits for tasks assessed                                     General Comments       Exercises Exercises: Shoulder Shoulder  Exercises Pendulum Exercise: PROM;Right;Seated;Standing(GENTLE) Shoulder Flexion: PROM;Right;10 reps;Supine(to 90 (educated - did not perform due to block)) Shoulder ABduction: PROM;Right;10 reps;Seated(to 60 (educated - did not perform due to block)) Shoulder External Rotation: PROM;Right;Seated;10 reps(to 30 (educated - did not perform due to block)) Elbow Flexion: PROM;AROM;AAROM;Right Elbow Extension: PROM;AROM;AAROM Wrist Flexion: AROM;Self ROM Wrist Extension: AROM;Self ROM Digit Composite Flexion: AROM;Self ROM Composite Extension: AROM;Self ROM Neck Flexion: AROM Neck Extension: AROM Neck Lateral Flexion - Right: AROM   Shoulder Instructions Shoulder Instructions Donning/doffing shirt without moving shoulder: Maximal assistance;Caregiver independent with task;Patient able to independently direct caregiver Method for sponge bathing under operated UE: Supervision/safety Donning/doffing sling/immobilizer: Maximal assistance;Caregiver independent with task;Patient able to independently direct caregiver Correct positioning of sling/immobilizer: Modified independent Pendulum exercises (written home exercise program): Supervision/safety ROM for elbow, wrist and digits of operated UE: (educated - not performed due to block in place) Sling wearing schedule (on at all times/off for ADL's): Independent Proper positioning of operated UE when showering: Supervision/safety Positioning of UE while sleeping: Moderate assistance;Caregiver independent with task;Patient able to independently direct caregiver    Home Living Family/patient expects to be discharged to:: Private residence Living Arrangements: Spouse/significant other;Parent(3 dogs) Available Help at Discharge: Family;Available 24 hours/day Type of Home: House Home Access: Stairs to enter [employer] of Steps: 6 Entrance Stairs-Rails: Right;Left;Can reach both Home Layout: Multi-level;Able to live on main level with  bedroom/bathroom     Bathroom Shower/Tub: Occupational psychologist: Standard Bathroom Accessibility: Yes How Accessible: Accessible via walker Home Equipment: Shower seat - built in;Hand held shower head          Prior Functioning/Environment Level of Independence: Independent        Comments: drives, enjoys riding motorcycles, works full time Insurance account manager        OT Problem List: Decreased range of motion;Decreased activity tolerance;Decreased knowledge of use of DME or AE;Decreased knowledge of precautions;Impaired sensation;Impaired UE functional use;Pain;Increased edema      OT Treatment/Interventions:      OT Goals(Current goals can be found in the care plan section) Acute Rehab OT Goals Patient Stated Goal: to get back to driving and golfing OT Goal Formulation: With patient/family Time For Goal Achievement: 02/18/18 Potential to Achieve Goals: Good  OT Frequency:     Barriers to D/C:            Co-evaluation              AM-PAC PT "6 Clicks" Daily Activity     Outcome Measure Help from another person eating meals?: A Lot Help from another person taking care of personal grooming?: A Lot Help from another person toileting, which includes using toliet, bedpan, or urinal?: A Little Help from another person bathing (including washing, rinsing, drying)?: A Little Help from another person to put on and taking off regular upper body clothing?: A Lot Help from another person to put on and taking off regular lower body clothing?: A Little 6 Click Score:  15   End of Session Equipment Utilized During Treatment: Other (comment)(sling) Nurse Communication: Mobility status  Activity Tolerance: Patient tolerated treatment well Patient left: in bed  OT Visit Diagnosis: Pain Pain - Right/Left: Right Pain - part of body: Shoulder                Time: 6834-1962 OT Time Calculation (min): 42 min Charges:  OT General Charges $OT Visit: 1  Visit OT Evaluation $OT Eval Moderate Complexity: 1 Mod OT Treatments $Self Care/Home Management : 8-22 mins $Therapeutic Exercise: 8-22 mins  Hulda Humphrey OTR/L Acute Rehabilitation Services Pager: [phone] Office: [place] 02/04/2018, 10:23 AM

## 2018-02-04 NOTE — Progress Notes (Signed)
Subjective: Pt stable - block in effect   Objective: Vital signs in last 24 hours: Temp:  [97.2 F (36.2 C)-99.1 F (37.3 C)] 98 F (36.7 C) (11/06 0440) Pulse Rate:  [52-72] 63 (11/06 0440) Resp:  [10-20] 14 (11/06 0440) BP: (68-169)/(52-76) 115/63 (11/06 0440) SpO2:  [96 %-98 %] 96 % (11/06 0440)  Intake/Output from previous day: 11/05 0701 - 11/06 0700 In: 3280 [P.O.:80; I.V.:2950; IV Piggyback:250] Out: 550 [Urine:150; Blood:400] Intake/Output this shift: Total I/O In: 240 [P.O.:240] Out: 775 [Urine:775]  Exam:  Compartment soft  Labs: No results for input(s): HGB in the last 72 hours. No results for input(s): WBC, RBC, HCT, PLT in the last 72 hours. No results for input(s): NA, K, CL, CO2, BUN, CREATININE, GLUCOSE, CALCIUM in the last 72 hours. No results for input(s): LABPT, INR in the last 72 hours.  Assessment/Plan: Plan new aquacel dressing today and dc to home after ot   [employer] 02/04/2018, 8:29 AM

## 2018-02-04 NOTE — Progress Notes (Signed)
Aquacel dressing to R shoulder surgical site saturated with blood. MD assessed pt and instructed to change now. Removed old dressing and wound began to actively bleed. Held manual pressure while pt lying flat and then applied ice with effectiveness. New dressing placed. Pt tolerated well. Assessment and vitals noted during this time.

## 2018-02-05 NOTE — Discharge Summary (Signed)
Physician Discharge Summary  Patient ID: Hayden Deleon MRN: [id number] DOB/AGE: 62/05/1955 62 y.o.  Admit date: 02/03/2018 Discharge date: 02/04/2018  Admission Diagnoses:  Active Problems:   Shoulder arthritis   Discharge Diagnoses:  Same  Surgeries: Procedure(s): RIGHT REVERSE SHOULDER ARTHROPLASTY on 02/03/2018   Consultants:   Discharged Condition: Stable  Hospital Course: Hayden Deleon is an 62 y.o. male who was admitted 02/03/2018 with a chief complaint of right shoulder pain, and found to have a diagnosis of severe right shoulder arthritis.  They were brought to the operating room on 02/03/2018 and underwent the above named procedures.  Patient tolerated procedure well.  Block was still functional at the time of discharge the following morning.  Dressing change at the time of discharge also.  Hand was perfused.  Plan is to begin CPM machine and his passive shoulder range of motion on postop day #1.  Pain medicine and muscle relaxers prescribed.  I will see the patient back in about 14 days.  Dressing is waterproof.  Antibiotics given:  Anti-infectives (From admission, onward)   Start     Dose/Rate Route Frequency Ordered Stop   02/03/18 2230  ceFAZolin (ANCEF) IVPB 2g/100 mL premix     2 g 200 mL/hr over 30 Minutes Intravenous Every 6 hours 02/03/18 2014 02/04/18 1113   02/03/18 1002  ceFAZolin (ANCEF) 2-4 GM/100ML-% IVPB    Note to Pharmacy:  Debbe Bales, Meredit: cabinet override      02/03/18 1002 02/03/18 1230   02/03/18 1000  ceFAZolin (ANCEF) IVPB 2g/100 mL premix     2 g 200 mL/hr over 30 Minutes Intravenous On call to O.R. 02/03/18 4196 02/03/18 1630    .  Recent vital signs:  Vitals:   02/04/18 0904 02/04/18 0908  BP: 113/61   Pulse: 64   Resp: 16   Temp: 98.4 F (36.9 C)   SpO2: 98% 97%    Recent laboratory studies:  Results for orders placed or performed during the hospital encounter of 01/26/18  Surgical pcr screen  Result Value Ref Range    MRSA, PCR NEGATIVE NEGATIVE   Staphylococcus aureus POSITIVE (A) NEGATIVE  Urine culture  Result Value Ref Range   Specimen Description URINE, CLEAN CATCH    Special Requests NONE    Culture      NO GROWTH Performed at [facility], [place] [address], [place], [place] [address]    Report Status 01/28/2018 FINAL   Basic metabolic panel  Result Value Ref Range   Sodium 143 135 - 145 mmol/L   Potassium 3.6 3.5 - 5.1 mmol/L   Chloride 109 98 - 111 mmol/L   CO2 29 22 - 32 mmol/L   Glucose, Bld 75 70 - 99 mg/dL   BUN 15 8 - 23 mg/dL   Creatinine, Ser 1.00 0.61 - 1.24 mg/dL   Calcium 9.4 8.9 - 10.3 mg/dL   GFR calc non Af Amer >60 >60 mL/min   GFR calc Af Amer >60 >60 mL/min   Anion gap 5 5 - 15  CBC  Result Value Ref Range   WBC 14.0 (H) 4.0 - 10.5 K/uL   RBC 4.84 4.22 - 5.81 MIL/uL   Hemoglobin 15.5 13.0 - 17.0 g/dL   HCT 47.9 39.0 - 52.0 %   MCV 99.0 80.0 - 100.0 fL   MCH 32.0 26.0 - 34.0 pg   MCHC 32.4 30.0 - 36.0 g/dL   RDW 12.9 11.5 - 15.5 %   Platelets 223 150 -  400 K/uL   nRBC 0.0 0.0 - 0.2 %  Urinalysis, Routine w reflex microscopic  Result Value Ref Range   Color, Urine YELLOW YELLOW   APPearance HAZY (A) CLEAR   Specific Gravity, Urine 1.027 1.005 - 1.030   pH 5.0 5.0 - 8.0   Glucose, UA NEGATIVE NEGATIVE mg/dL   Hgb urine dipstick NEGATIVE NEGATIVE   Bilirubin Urine NEGATIVE NEGATIVE   Ketones, ur NEGATIVE NEGATIVE mg/dL   Protein, ur NEGATIVE NEGATIVE mg/dL   Nitrite NEGATIVE NEGATIVE   Leukocytes, UA NEGATIVE NEGATIVE    Discharge Medications:   Allergies as of 02/04/2018      Reactions   Avelox [moxifloxacin Hcl In Nacl] Other (See Comments)   Facial swelling   Sulfamethoxazole-trimethoprim Other (See Comments)   REACTION: sore mouth   Aspirin Other (See Comments)   REACTION: Nausea; fine with 81mg       Medication List    STOP taking these medications   predniSONE 20 MG tablet Commonly known as:  DELTASONE     TAKE these medications    albuterol 108 (90 Base) MCG/ACT inhaler Commonly known as:  PROVENTIL HFA;VENTOLIN HFA TAKE 2 PUFFS BY MOUTH EVERY 6 HOURS AS NEEDED FOR WHEEZE   aspirin 81 MG tablet Take 81 mg by mouth daily.   atorvastatin 40 MG tablet Commonly known as:  LIPITOR Take 1 tablet (40 mg total) by mouth daily.   metoprolol tartrate 50 MG tablet Commonly known as:  LOPRESSOR TAKE 1 TABLET BY MOUTH TWICE A DAY What changed:  when to take this   multivitamin capsule Take 1 capsule by mouth daily.   sertraline 50 MG tablet Commonly known as:  ZOLOFT TAKE 1 TABLET BY MOUTH EVERY DAY   TRELEGY ELLIPTA 100-62.5-25 MCG/INH Aepb Generic drug:  Fluticasone-Umeclidin-Vilant USE 1 PUFF DAILY AS DIRECTED What changed:  See the new instructions.   vitamin B-12 1000 MCG tablet Commonly known as:  CYANOCOBALAMIN Take 1,000 mcg by mouth daily.       Diagnostic Studies: Dg Shoulder Right Port  Result Date: 02/03/2018 CLINICAL DATA:  Followup shoulder arthroplasty EXAM: PORTABLE RIGHT SHOULDER COMPARISON:  11/28/2017 FINDINGS: One view shows total shoulder arthroplasty, reverse type. No complicating features evident. IMPRESSION: Reverse type shoulder arthroplasty. Electronically Signed   By: Nelson Chimes M.D.   On: 02/03/2018 19:37    Disposition:   Discharge Instructions    Call MD / Call 911   Complete by:  As directed    If you experience chest pain or shortness of breath, CALL 911 and be transported to the hospital emergency room.  If you develope a fever above 101 F, pus (white drainage) or increased drainage or redness at the wound, or calf pain, call your surgeon's office.   Constipation Prevention   Complete by:  As directed    Drink plenty of fluids.  Prune juice may be helpful.  You may use a stool softener, such as Colace (over the counter) 100 mg twice a day.  Use MiraLax (over the counter) for constipation as needed.   Diet - low sodium heart healthy   Complete by:  As directed     Discharge instructions   Complete by:  As directed    Begin pain meds tonight No lifting with right arm Use brace 1 hour 3 times a day   Increase activity slowly as tolerated   Complete by:  As directed          Signed: Anderson Malta 02/05/2018, 11:01  AM

## 2018-02-06 ENCOUNTER — Encounter (HOSPITAL_COMMUNITY): Payer: Self-pay | Admitting: Orthopedic Surgery

## 2018-02-06 ENCOUNTER — Telehealth (INDEPENDENT_AMBULATORY_CARE_PROVIDER_SITE_OTHER): Payer: Self-pay

## 2018-02-06 NOTE — Telephone Encounter (Signed)
Patient's wife Letta Median called and stated that patient had a temperature of 102 yesterday and she gave patient Tylenol, but patient's temperature today is 99.8.  Patient had Right Shoulder surgery on Tuesday, 02/03/2018.  Stated that patient has not taken any for pain, just used ice.  Cb# is [phone].  Please advise.  Thank You.

## 2018-02-06 NOTE — Telephone Encounter (Signed)
I called.

## 2018-02-06 NOTE — Telephone Encounter (Signed)
Please advise. Thanks.  

## 2018-02-11 ENCOUNTER — Telehealth (INDEPENDENT_AMBULATORY_CARE_PROVIDER_SITE_OTHER): Payer: Self-pay | Admitting: Orthopedic Surgery

## 2018-02-11 NOTE — Telephone Encounter (Signed)
Yes, that is correct, can you please call him and add him in? Thanks.

## 2018-02-11 NOTE — Telephone Encounter (Signed)
Patient called stated he should have a appointment with Dr.Dean on Friday per Dr.Dean

## 2018-02-15 ENCOUNTER — Encounter (HOSPITAL_COMMUNITY): Payer: Self-pay | Admitting: Orthopedic Surgery

## 2018-02-16 ENCOUNTER — Encounter: Payer: Self-pay | Admitting: Cardiovascular Disease

## 2018-02-16 ENCOUNTER — Ambulatory Visit (INDEPENDENT_AMBULATORY_CARE_PROVIDER_SITE_OTHER): Payer: BLUE CROSS/BLUE SHIELD

## 2018-02-16 ENCOUNTER — Ambulatory Visit (INDEPENDENT_AMBULATORY_CARE_PROVIDER_SITE_OTHER): Payer: BLUE CROSS/BLUE SHIELD | Admitting: Orthopedic Surgery

## 2018-02-16 ENCOUNTER — Encounter (INDEPENDENT_AMBULATORY_CARE_PROVIDER_SITE_OTHER): Payer: Self-pay | Admitting: Orthopedic Surgery

## 2018-02-16 DIAGNOSIS — M19011 Primary osteoarthritis, right shoulder: Secondary | ICD-10-CM

## 2018-02-18 ENCOUNTER — Ambulatory Visit: Payer: BLUE CROSS/BLUE SHIELD

## 2018-02-18 ENCOUNTER — Encounter (INDEPENDENT_AMBULATORY_CARE_PROVIDER_SITE_OTHER): Payer: Self-pay | Admitting: Orthopedic Surgery

## 2018-02-18 ENCOUNTER — Other Ambulatory Visit: Payer: Self-pay | Admitting: *Deleted

## 2018-02-18 DIAGNOSIS — R011 Cardiac murmur, unspecified: Secondary | ICD-10-CM

## 2018-02-18 NOTE — Progress Notes (Signed)
Post-Op Visit Note   Patient: Hayden Deleon           Date of Birth: 12-01-1955           MRN: [id number] Visit Date: 02/16/2018 PCP: Lucille Passy, MD   Assessment & Plan:  Chief Complaint:  Chief Complaint  Patient presents with  . Right Shoulder - Routine Post Op   Visit Diagnoses:  1. Arthritis of right shoulder region     Plan: Hayden Deleon is a patient who is now about 2 weeks out right shoulder reverse replacement.  She is he is doing well on the CPM machine at 97 degrees.  No meds for pain.  On exam deltoid is functional and he has good range of motion of the shoulder.  Radiographs look good.  I will let him return to work next Monday.  I do want him to start in physical therapy.  I will see him back in 4 weeks for clinical recheck.  Follow-Up Instructions: Return in about 4 weeks (around 03/16/2018).   Orders:  Orders Placed This Encounter  Procedures  . XR Shoulder Right  . Ambulatory referral to Physical Therapy   No orders of the defined types were placed in this encounter.   Imaging: No results found.  PMFS History: Patient Active Problem List   Diagnosis Date Noted  . Shoulder arthritis 02/03/2018  . Gout 01/21/2018  . Low testosterone 01/21/2018  . Rash and nonspecific skin eruption 10/27/2017  . BPV (benign positional vertigo), left 07/10/2017  . Vitamin B12 deficiency 04/02/2017  . COPD (chronic obstructive pulmonary disease) ([place]) 10/07/2014  . OSA (obstructive sleep apnea) 05/01/2012  . CAD (coronary artery disease)   . Carotid artery disease ([place])   . GERD (gastroesophageal reflux disease)   . Ejection fraction   . MIXED HYPERLIPIDEMIA 05/03/2010  . BENIGN POSITIONAL VERTIGO 05/03/2010  . Anxiety state 11/10/2008  . ANGIOEDEMA 08/18/2007  . SYSTOLIC MURMUR 67/03/4579  . DYSPHAGIA UNSPECIFIED 05/22/2007  . PROSTATITIS, HX OF 05/22/2007  . ADVEF, DRUG/MEDICINAL/BIOLOGICAL SUBST NOS 01/26/2007  . COLONIC POLYPS 12/31/2003  . GASTROESOPHAGEAL  REFLUX DISEASE, CHRONIC 12/31/2003  . HIATAL HERNIA 12/31/2003  . DIVERTICULOSIS, COLON 12/31/2003  . HLD (hyperlipidemia) 05/02/2001  . HTN (hypertension) 05/02/2001   Past Medical History:  Diagnosis Date  . Allergy   . Anxiety   . Arthritis    right shoulder  . CAD (coronary artery disease)    Catheterization, 2003, occluded OM1, good collaterals  //   nuclear, August, 2009, low risk, EF 60%  . Carotid artery disease ([place])    Doppler, February, 2012, 9-98% R. ICA, 33-82% LICA,  plan followup 1 year  . COPD (chronic obstructive pulmonary disease) ([facility])    Significant Dr. Lamonte Sakai  . Diverticulosis of colon   . Ejection fraction    EF 60%, nuclear, 2009  . GERD (gastroesophageal reflux disease)   . Gout   . Hyperlipidemia   . Hypertension   . Prostatitis   . Sleep apnea    uses cpap    Family History  Problem Relation Age of Onset  . Angina Mother        car accident  . Asthma Father   . Crohn's disease Sister   . Colon cancer Neg Hx   . Esophageal cancer Neg Hx   . Rectal cancer Neg Hx   . Stomach cancer Neg Hx     Past Surgical History:  Procedure Laterality Date  . COLONOSCOPY  12/2003  patterson - polyp  . deviated septum    . KNEE ARTHROSCOPY     right  . TOTAL SHOULDER ARTHROPLASTY Right 02/03/2018   Procedure: RIGHT REVERSE SHOULDER ARTHROPLASTY;  Surgeon: Meredith Pel, MD;  Location: [facility];  Service: Orthopedics;  Laterality: Right;  . WISDOM TOOTH EXTRACTION     Social History   Occupational History  . Occupation: Freight forwarder at [employer]    Employer: [employer]  Tobacco Use  . Smoking status: Former Smoker    Packs/day: 2.00    Years: 32.00    Pack years: 64.00    Types: Cigarettes    Last attempt to quit: 08/31/1999    Years since quitting: 18.4  . Smokeless tobacco: Former Network engineer and Sexual Activity  . Alcohol use: No    Alcohol/week: 0.0 standard drinks  . Drug use: No  . Sexual activity: Not on file

## 2018-02-20 ENCOUNTER — Other Ambulatory Visit: Payer: BLUE CROSS/BLUE SHIELD

## 2018-02-24 DIAGNOSIS — M19011 Primary osteoarthritis, right shoulder: Secondary | ICD-10-CM | POA: Diagnosis not present

## 2018-03-02 DIAGNOSIS — M19011 Primary osteoarthritis, right shoulder: Secondary | ICD-10-CM | POA: Diagnosis not present

## 2018-03-05 ENCOUNTER — Ambulatory Visit (INDEPENDENT_AMBULATORY_CARE_PROVIDER_SITE_OTHER): Payer: BLUE CROSS/BLUE SHIELD

## 2018-03-05 DIAGNOSIS — R011 Cardiac murmur, unspecified: Secondary | ICD-10-CM | POA: Diagnosis not present

## 2018-03-10 DIAGNOSIS — M19011 Primary osteoarthritis, right shoulder: Secondary | ICD-10-CM | POA: Diagnosis not present

## 2018-03-12 DIAGNOSIS — M19011 Primary osteoarthritis, right shoulder: Secondary | ICD-10-CM | POA: Diagnosis not present

## 2018-03-16 ENCOUNTER — Encounter (INDEPENDENT_AMBULATORY_CARE_PROVIDER_SITE_OTHER): Payer: Self-pay | Admitting: Orthopedic Surgery

## 2018-03-16 ENCOUNTER — Ambulatory Visit (INDEPENDENT_AMBULATORY_CARE_PROVIDER_SITE_OTHER): Payer: BLUE CROSS/BLUE SHIELD | Admitting: Orthopedic Surgery

## 2018-03-16 DIAGNOSIS — Z96611 Presence of right artificial shoulder joint: Secondary | ICD-10-CM

## 2018-03-16 NOTE — Progress Notes (Signed)
Post-Op Visit Note   Patient: Hayden Deleon           Date of Birth: 1955-05-30           MRN: [id number] Visit Date: 03/16/2018 PCP: Lucille Passy, MD   Assessment & Plan:  Chief Complaint:  Chief Complaint  Patient presents with  . Right Shoulder - Routine Post Op   Visit Diagnoses:  1. Status post reverse arthroplasty of right shoulder     Plan: Hayden Deleon is a patient who is now 6 weeks out right reverse shoulder replacement.  Been doing well.  He is back to work.  On exam he has forward flexion and abduction both above 90 degrees.  He is got good subscap strength.  I will have him continue physical therapy and then see him back in 6 weeks for final clinical check.  Not taking any pain medicine at this time.  Follow-Up Instructions: Return in about 6 weeks (around 04/27/2018).   Orders:  No orders of the defined types were placed in this encounter.  No orders of the defined types were placed in this encounter.   Imaging: No results found.  PMFS History: Patient Active Problem List   Diagnosis Date Noted  . Shoulder arthritis 02/03/2018  . Gout 01/21/2018  . Low testosterone 01/21/2018  . Rash and nonspecific skin eruption 10/27/2017  . BPV (benign positional vertigo), left 07/10/2017  . Vitamin B12 deficiency 04/02/2017  . COPD (chronic obstructive pulmonary disease) ([place]) 10/07/2014  . OSA (obstructive sleep apnea) 05/01/2012  . CAD (coronary artery disease)   . Carotid artery disease ([place])   . GERD (gastroesophageal reflux disease)   . Ejection fraction   . MIXED HYPERLIPIDEMIA 05/03/2010  . BENIGN POSITIONAL VERTIGO 05/03/2010  . Anxiety state 11/10/2008  . ANGIOEDEMA 08/18/2007  . SYSTOLIC MURMUR 62/95/2841  . DYSPHAGIA UNSPECIFIED 05/22/2007  . PROSTATITIS, HX OF 05/22/2007  . ADVEF, DRUG/MEDICINAL/BIOLOGICAL SUBST NOS 01/26/2007  . COLONIC POLYPS 12/31/2003  . GASTROESOPHAGEAL REFLUX DISEASE, CHRONIC 12/31/2003  . HIATAL HERNIA 12/31/2003  .  DIVERTICULOSIS, COLON 12/31/2003  . HLD (hyperlipidemia) 05/02/2001  . HTN (hypertension) 05/02/2001   Past Medical History:  Diagnosis Date  . Allergy   . Anxiety   . Arthritis    right shoulder  . CAD (coronary artery disease)    Catheterization, 2003, occluded OM1, good collaterals  //   nuclear, August, 2009, low risk, EF 60%  . Carotid artery disease ([place])    Doppler, February, 2012, 3-24% R. ICA, 40-10% LICA,  plan followup 1 year  . COPD (chronic obstructive pulmonary disease) ([facility])    Significant Dr. Lamonte Sakai  . Diverticulosis of colon   . Ejection fraction    EF 60%, nuclear, 2009  . GERD (gastroesophageal reflux disease)   . Gout   . Hyperlipidemia   . Hypertension   . Prostatitis   . Sleep apnea    uses cpap    Family History  Problem Relation Age of Onset  . Angina Mother        car accident  . Asthma Father   . Crohn's disease Sister   . Colon cancer Neg Hx   . Esophageal cancer Neg Hx   . Rectal cancer Neg Hx   . Stomach cancer Neg Hx     Past Surgical History:  Procedure Laterality Date  . COLONOSCOPY  12/2003   patterson - polyp  . deviated septum    . KNEE ARTHROSCOPY     right  .  TOTAL SHOULDER ARTHROPLASTY Right 02/03/2018   Procedure: RIGHT REVERSE SHOULDER ARTHROPLASTY;  Surgeon: Meredith Pel, MD;  Location: [facility];  Service: Orthopedics;  Laterality: Right;  . WISDOM TOOTH EXTRACTION     Social History   Occupational History  . Occupation: Freight forwarder at [employer]    Employer: [employer]  Tobacco Use  . Smoking status: Former Smoker    Packs/day: 2.00    Years: 32.00    Pack years: 64.00    Types: Cigarettes    Last attempt to quit: 08/31/1999    Years since quitting: 18.5  . Smokeless tobacco: Former Network engineer and Sexual Activity  . Alcohol use: No    Alcohol/week: 0.0 standard drinks  . Drug use: No  . Sexual activity: Not on file

## 2018-03-18 ENCOUNTER — Other Ambulatory Visit: Payer: Self-pay | Admitting: Family Medicine

## 2018-03-20 DIAGNOSIS — M19011 Primary osteoarthritis, right shoulder: Secondary | ICD-10-CM | POA: Diagnosis not present

## 2018-03-23 ENCOUNTER — Other Ambulatory Visit: Payer: Self-pay | Admitting: Internal Medicine

## 2018-03-31 ENCOUNTER — Encounter: Payer: Self-pay | Admitting: Family Medicine

## 2018-03-31 ENCOUNTER — Ambulatory Visit (INDEPENDENT_AMBULATORY_CARE_PROVIDER_SITE_OTHER): Payer: BLUE CROSS/BLUE SHIELD | Admitting: Family Medicine

## 2018-03-31 VITALS — BP 132/76 | HR 66 | Temp 98.5°F | Ht 71.0 in | Wt 212.8 lb

## 2018-03-31 DIAGNOSIS — J0111 Acute recurrent frontal sinusitis: Secondary | ICD-10-CM | POA: Diagnosis not present

## 2018-03-31 DIAGNOSIS — J449 Chronic obstructive pulmonary disease, unspecified: Secondary | ICD-10-CM | POA: Diagnosis not present

## 2018-03-31 MED ORDER — AZITHROMYCIN 250 MG PO TABS: ORAL_TABLET | ORAL | 0 refills | Status: DC

## 2018-03-31 MED ORDER — PREDNISONE 10 MG PO TABS: ORAL_TABLET | ORAL | 0 refills | Status: DC

## 2018-03-31 NOTE — Progress Notes (Signed)
SUBJECTIVE:  Hayden Deleon is a 62 y.o. male who complains of coryza, congestion, productive cough, wheezing and bilateral sinus pain for 7 days. He denies a history of anorexia, chest pain and chills and admits to a history of asthma. Patient denies smoke cigarettes.   Current Outpatient Medications on File Prior to Visit  Medication Sig Dispense Refill  . Albuterol Sulfate (PROAIR RESPICLICK) 950 (90 Base) MCG/ACT AEPB Inhale 18 g into the lungs every 6 (six) hours as needed. TAKE 2 PUFFS BY MOUTH EVERY 6 HOURS AS NEEDED FOR WHEEZE 1 each 1  . aspirin 81 MG tablet Take 81 mg by mouth daily.      [name] atorvastatin (LIPITOR) 40 MG tablet Take 1 tablet (40 mg total) by mouth daily. 90 tablet 1  . metoprolol tartrate (LOPRESSOR) 50 MG tablet TAKE 1 TABLET BY MOUTH TWICE A DAY (Patient taking differently: Take 50 mg by mouth daily. ) 60 tablet 5  . Multiple Vitamin (MULTIVITAMIN) capsule Take 1 capsule by mouth daily.      . sertraline (ZOLOFT) 50 MG tablet TAKE 1 TABLET BY MOUTH EVERY DAY 90 tablet 1  . TRELEGY ELLIPTA 100-62.5-25 MCG/INH AEPB USE 1 PUFF DAILY AS DIRECTED 60 each 0  . vitamin B-12 (CYANOCOBALAMIN) 1000 MCG tablet Take 1,000 mcg by mouth daily.     No current facility-administered medications on file prior to visit.     Allergies  Allergen Reactions  . Avelox [Moxifloxacin Hcl In Nacl] Other (See Comments)    Facial swelling  . Sulfamethoxazole-Trimethoprim Other (See Comments)    REACTION: sore mouth  . Aspirin Other (See Comments)    REACTION: Nausea; fine with 81mg     Past Medical History:  Diagnosis Date  . Allergy   . Anxiety   . Arthritis    right shoulder  . CAD (coronary artery disease)    Catheterization, 2003, occluded OM1, good collaterals  //   nuclear, August, 2009, low risk, EF 60%  . Carotid artery disease ([place])    Doppler, February, 2012, 9-32% R. ICA, 67-12% LICA,  plan followup 1 year  . COPD (chronic obstructive pulmonary disease) ([facility])    Significant Dr. Lamonte Sakai  . Diverticulosis of colon   . Ejection fraction    EF 60%, nuclear, 2009  . GERD (gastroesophageal reflux disease)   . Gout   . Hyperlipidemia   . Hypertension   . Prostatitis   . Sleep apnea    uses cpap    Past Surgical History:  Procedure Laterality Date  . COLONOSCOPY  12/2003   patterson - polyp  . deviated septum    . KNEE ARTHROSCOPY     right  . TOTAL SHOULDER ARTHROPLASTY Right 02/03/2018   Procedure: RIGHT REVERSE SHOULDER ARTHROPLASTY;  Surgeon: Meredith Pel, MD;  Location: [facility];  Service: Orthopedics;  Laterality: Right;  . WISDOM TOOTH EXTRACTION      Family History  Problem Relation Age of Onset  . Angina Mother        car accident  . Asthma Father   . Crohn's disease Sister   . Colon cancer Neg Hx   . Esophageal cancer Neg Hx   . Rectal cancer Neg Hx   . Stomach cancer Neg Hx     Social History   Socioeconomic History  . Marital status: Married    Spouse name: Not on file  . Number of children: 0  . Years of education: Not on file  . Highest education level:  Not on file  Occupational History  . Occupation: Freight forwarder at [employer]    Employer: [employer]  Social Needs  . Financial resource strain: Not on file  . Food insecurity:    Worry: Not on file    Inability: Not on file  . Transportation needs:    Medical: Not on file    Non-medical: Not on file  Tobacco Use  . Smoking status: Former Smoker    Packs/day: 2.00    Years: 32.00    Pack years: 64.00    Types: Cigarettes    Last attempt to quit: 08/31/1999    Years since quitting: 18.5  . Smokeless tobacco: Former Network engineer and Sexual Activity  . Alcohol use: No    Alcohol/week: 0.0 standard drinks  . Drug use: No  . Sexual activity: Not on file  Lifestyle  . Physical activity:    Days per week: Not on file    Minutes per session: Not on file  . Stress: Not on file  Relationships  . Social connections:    Talks on phone: Not  on file    Gets together: Not on file    Attends religious service: Not on file    Active member of club or organization: Not on file    Attends meetings of clubs or organizations: Not on file    Relationship status: Not on file  . Intimate partner violence:    Fear of current or ex partner: Not on file    Emotionally abused: Not on file    Physically abused: Not on file    Forced sexual activity: Not on file  Other Topics Concern  . Not on file  Social History Narrative  . Not on file   The PMH, PSH, Social History, Family History, Medications, and allergies have been reviewed in [facility], and have been updated if relevant.  OBJECTIVE: BP 132/76 (BP Location: Left Arm, Patient Position: Sitting, Cuff Size: Normal)   Pulse 66   Temp 98.5 F (36.9 C) (Oral)   Ht 5\' 11"  (1.803 m)   Wt 212 lb 12.8 oz (96.5 kg)   SpO2 93%   BMI 29.68 kg/m   He appears well, vital signs are as noted. Ears normal.  Throat and pharynx normal.  Neck supple. No adenopathy in the neck. Nose is congested. Sinuses tender.  Scattered bilateral wheezes  ASSESSMENT:  sinusitis and COPD exacerbation  PLAN: Given duration and progression of symptoms in a patient with COPD , will treat for bacterial process. eRx sent for zpack, prednisone burst/taper.  Symptomatic therapy suggested: push fluids, rest and return office visit prn if symptoms persist or worsen.Call or return to clinic prn if these symptoms worsen or fail to improve as anticipated.

## 2018-03-31 NOTE — Patient Instructions (Signed)
Great to see you. Happy new Year!  Take zpack pack and prednisone taper as directed.  Keep me updated.

## 2018-04-29 ENCOUNTER — Ambulatory Visit (INDEPENDENT_AMBULATORY_CARE_PROVIDER_SITE_OTHER): Payer: BLUE CROSS/BLUE SHIELD | Admitting: Orthopedic Surgery

## 2018-04-29 ENCOUNTER — Encounter (INDEPENDENT_AMBULATORY_CARE_PROVIDER_SITE_OTHER): Payer: Self-pay | Admitting: Orthopedic Surgery

## 2018-04-29 DIAGNOSIS — Z96611 Presence of right artificial shoulder joint: Secondary | ICD-10-CM

## 2018-04-29 NOTE — Progress Notes (Signed)
Post-Op Visit Note   Patient: Hayden Deleon           Date of Birth: 11/21/1955           MRN: [id number] Visit Date: 04/29/2018 PCP: Hayden Passy, MD   Assessment & Plan:  Chief Complaint:  Chief Complaint  Patient presents with  . Right Shoulder - Pain   Visit Diagnoses:  1. Status post reverse arthroplasty of right shoulder     Plan: Hayden Deleon is a patient is now 3 months out right reverse shoulder replacement.  He is doing well.  He is doing home exercises.  On exam he has forward flexion abduction both easily above 90 degrees.  Strength is also good.  He is happy with his surgical result.  He is can continue stretching and strengthening.  I do not want him lifting more than 15 pounds long-term.  I will see him back as needed.  He does need dental prophylaxis before any type of procedures.  That is discussed with the patient as well.  Follow-Up Instructions: Return if symptoms worsen or fail to improve.   Orders:  No orders of the defined types were placed in this encounter.  No orders of the defined types were placed in this encounter.   Imaging: No results found.  PMFS History: Patient Active Problem List   Diagnosis Date Noted  . Shoulder arthritis 02/03/2018  . Gout 01/21/2018  . Low testosterone 01/21/2018  . Rash and nonspecific skin eruption 10/27/2017  . BPV (benign positional vertigo), left 07/10/2017  . Vitamin B12 deficiency 04/02/2017  . COPD (chronic obstructive pulmonary disease) ([place]) 10/07/2014  . OSA (obstructive sleep apnea) 05/01/2012  . CAD (coronary artery disease)   . Carotid artery disease ([place])   . GERD (gastroesophageal reflux disease)   . Ejection fraction   . MIXED HYPERLIPIDEMIA 05/03/2010  . BENIGN POSITIONAL VERTIGO 05/03/2010  . Anxiety state 11/10/2008  . ANGIOEDEMA 08/18/2007  . SYSTOLIC MURMUR 56/21/3086  . DYSPHAGIA UNSPECIFIED 05/22/2007  . PROSTATITIS, HX OF 05/22/2007  . ADVEF, DRUG/MEDICINAL/BIOLOGICAL SUBST NOS  01/26/2007  . COLONIC POLYPS 12/31/2003  . GASTROESOPHAGEAL REFLUX DISEASE, CHRONIC 12/31/2003  . HIATAL HERNIA 12/31/2003  . DIVERTICULOSIS, COLON 12/31/2003  . HLD (hyperlipidemia) 05/02/2001  . HTN (hypertension) 05/02/2001   Past Medical History:  Diagnosis Date  . Allergy   . Anxiety   . Arthritis    right shoulder  . CAD (coronary artery disease)    Catheterization, 2003, occluded OM1, good collaterals  //   nuclear, August, 2009, low risk, EF 60%  . Carotid artery disease ([place])    Doppler, February, 2012, 5-78% R. ICA, 46-96% LICA,  plan followup 1 year  . COPD (chronic obstructive pulmonary disease) ([facility])    Significant Dr. Lamonte Sakai  . Diverticulosis of colon   . Ejection fraction    EF 60%, nuclear, 2009  . GERD (gastroesophageal reflux disease)   . Gout   . Hyperlipidemia   . Hypertension   . Prostatitis   . Sleep apnea    uses cpap    Family History  Problem Relation Age of Onset  . Angina Mother        car accident  . Asthma Father   . Crohn's disease Sister   . Colon cancer Neg Hx   . Esophageal cancer Neg Hx   . Rectal cancer Neg Hx   . Stomach cancer Neg Hx     Past Surgical History:  Procedure Laterality Date  .  COLONOSCOPY  12/2003   patterson - polyp  . deviated septum    . KNEE ARTHROSCOPY     right  . TOTAL SHOULDER ARTHROPLASTY Right 02/03/2018   Procedure: RIGHT REVERSE SHOULDER ARTHROPLASTY;  Surgeon: Meredith Pel, MD;  Location: [facility];  Service: Orthopedics;  Laterality: Right;  . WISDOM TOOTH EXTRACTION     Social History   Occupational History  . Occupation: Freight forwarder at [employer]    Employer: [employer]  Tobacco Use  . Smoking status: Former Smoker    Packs/day: 2.00    Years: 32.00    Pack years: 64.00    Types: Cigarettes    Last attempt to quit: 08/31/1999    Years since quitting: 18.6  . Smokeless tobacco: Former Network engineer and Sexual Activity  . Alcohol use: No    Alcohol/week: 0.0 standard  drinks  . Drug use: No  . Sexual activity: Not on file

## 2018-05-03 ENCOUNTER — Other Ambulatory Visit: Payer: Self-pay | Admitting: Internal Medicine

## 2018-05-05 ENCOUNTER — Telehealth: Payer: Self-pay | Admitting: Internal Medicine

## 2018-05-05 MED ORDER — FLUTICASONE-UMECLIDIN-VILANT 100-62.5-25 MCG/INH IN AEPB: 1.0000 | INHALATION_SPRAY | Freq: Every day | RESPIRATORY_TRACT | 0 refills | Status: DC

## 2018-05-05 NOTE — Telephone Encounter (Signed)
Returned call to patient. Pt needs to contact his insurance and find out what covered alternative is. Pt would like rx sent for Trelegy to see if PA request b/c we have not received.

## 2018-05-11 ENCOUNTER — Telehealth (INDEPENDENT_AMBULATORY_CARE_PROVIDER_SITE_OTHER): Payer: Self-pay | Admitting: *Deleted

## 2018-05-11 NOTE — Telephone Encounter (Signed)
I called Hayden Deleon and advised.  She states that she spoke with someone this morning.

## 2018-05-27 ENCOUNTER — Other Ambulatory Visit: Payer: Self-pay

## 2018-05-28 ENCOUNTER — Ambulatory Visit: Payer: BLUE CROSS/BLUE SHIELD | Admitting: Family Medicine

## 2018-05-28 MED ORDER — FLUTICASONE-UMECLIDIN-VILANT 100-62.5-25 MCG/INH IN AEPB: 1.0000 | INHALATION_SPRAY | Freq: Every day | RESPIRATORY_TRACT | 0 refills | Status: DC

## 2018-06-03 ENCOUNTER — Telehealth: Payer: Self-pay | Admitting: Family Medicine

## 2018-06-03 ENCOUNTER — Other Ambulatory Visit: Payer: Self-pay

## 2018-06-03 ENCOUNTER — Ambulatory Visit: Payer: BLUE CROSS/BLUE SHIELD | Admitting: Family Medicine

## 2018-06-03 ENCOUNTER — Other Ambulatory Visit (INDEPENDENT_AMBULATORY_CARE_PROVIDER_SITE_OTHER): Payer: BLUE CROSS/BLUE SHIELD

## 2018-06-03 DIAGNOSIS — R7989 Other specified abnormal findings of blood chemistry: Secondary | ICD-10-CM | POA: Diagnosis not present

## 2018-06-03 LAB — TESTOSTERONE: TESTOSTERONE: 380.34 ng/dL (ref 300.00–890.00)

## 2018-06-03 NOTE — Telephone Encounter (Signed)
Created letter for TA to sign.  Per [facility] visit dated 5.2.2014 pt has right inguinal hernia which notation is needed on DOT PE form/will fax form when signed/thx dmf

## 2018-06-03 NOTE — Telephone Encounter (Signed)
Patient came by this morning and dropped of DOT physical form that needs to be filled out. Please call patient at [phone] once forms are filled out. Forms will be in Dr. Hulen Shouts folder at the front.

## 2018-06-04 NOTE — Telephone Encounter (Signed)
PEC-I LMOVM stating that letter is ready and to call back to advise US of the following:  1.) Do we fax letter and form to fax number on form?  2.) Does he want a copy put up front for him to pick up or does he want [place] to mail him a copy?  Please advise/thx dmf

## 2018-06-04 NOTE — Telephone Encounter (Signed)
1.  Pt doesn't want it faxed. 2.  Pt will come by in the morning to pick up form.

## 2018-06-05 NOTE — Telephone Encounter (Signed)
Prepared and is at front in envelope/thx dmf

## 2018-06-07 LAB — TESTOSTERONE, FREE: TESTOSTERONE FREE: 33 pg/mL — AB (ref 46.0–224.0)

## 2018-06-11 ENCOUNTER — Encounter: Payer: Self-pay | Admitting: Family Medicine

## 2018-06-16 ENCOUNTER — Telehealth: Payer: Self-pay | Admitting: Family Medicine

## 2018-06-16 NOTE — Telephone Encounter (Signed)
Tried to call patient about message he sent wanting to set up an appointment for his chest congestion. Patient wants to see Dr Deborra Medina between 06/16/2018 and 06/19/2018 but Dr Deborra Medina is out of the office this week so I wanted to see if he wanted to see a different provider or wait until next week.

## 2018-06-23 ENCOUNTER — Telehealth (INDEPENDENT_AMBULATORY_CARE_PROVIDER_SITE_OTHER): Payer: BLUE CROSS/BLUE SHIELD | Admitting: Family Medicine

## 2018-06-23 ENCOUNTER — Ambulatory Visit: Payer: BLUE CROSS/BLUE SHIELD | Admitting: Family Medicine

## 2018-06-23 ENCOUNTER — Encounter: Payer: Self-pay | Admitting: Family Medicine

## 2018-06-23 DIAGNOSIS — J449 Chronic obstructive pulmonary disease, unspecified: Secondary | ICD-10-CM

## 2018-06-23 MED ORDER — PREDNISONE 10 MG PO TABS: ORAL_TABLET | ORAL | 0 refills | Status: DC

## 2018-06-23 MED ORDER — AZITHROMYCIN 250 MG PO TABS: ORAL_TABLET | ORAL | 0 refills | Status: DC

## 2018-06-23 NOTE — Assessment & Plan Note (Signed)
Deteriorated. Afebrile, no increased work of breathing. Given history, treat with zpack and steroid burst. Call or return to clinic prn if these symptoms worsen or fail to improve as anticipated. The patient indicates understanding of these issues and agrees with the plan. No orders of the defined types were placed in this encounter.

## 2018-06-23 NOTE — Progress Notes (Signed)
Patient agrees to Tele-Visit. He finished Z-Pak and Prednisone Taper in [place] but feels that he needs to be on Prednisone again. The Sx came back in February. Cough is productive with beige sputum. Denies any sinus pressure.  Nose is running quite a lot with clear mucous.

## 2018-06-23 NOTE — Progress Notes (Signed)
TELEPHONE ENCOUNTER   Patient verbally agreed to telephone visit and is aware that copayment and coinsurance may apply. Patient was treated using telemedicine according to accepted telemedicine protocols.  Location of the patient: Home  Location of provider- [employer] of all persons participating in the telemedicine service and role in the encounter: Arnette Norris, MD , Jacqualine Code  Subjective:      HPI   He finished Z-Pak and Prednisone Taper in [place] but feels that he needs to be on Prednisone again. The Sx came back in February. Cough is productive with beige sputum. Denies any sinus pressure.  Nose is running quite a lot with clear mucous.  H/o significant COPD. Afebrile.  BP 132/66   Pulse 74   Ht 5\' 11"  (1.803 m)   Wt 220 lb (99.8 kg)   SpO2 90%   BMI 30.68 kg/m       Review of Systems  HENT: Positive for congestion. Negative for postnasal drip, rhinorrhea, sinus pressure, sinus pain and sneezing.   Eyes: Negative.   Respiratory: Positive for cough, shortness of breath and wheezing.   Gastrointestinal: Negative.   Endocrine: Negative.   Genitourinary: Negative.   Musculoskeletal: Negative.   Allergic/Immunologic: Negative.   Neurological: Negative.   Hematological: Negative.   Psychiatric/Behavioral: Negative.   All other systems reviewed and are negative.      Patient Active Problem List   Diagnosis Date Noted  . Shoulder arthritis 02/03/2018  . Gout 01/21/2018  . Low testosterone 01/21/2018  . Rash and nonspecific skin eruption 10/27/2017  . BPV (benign positional vertigo), left 07/10/2017  . Vitamin B12 deficiency 04/02/2017  . COPD (chronic obstructive pulmonary disease) ([place]) 10/07/2014  . Inguinal hernia of right side without obstruction or gangrene 07/31/2012  . OSA (obstructive sleep apnea) 05/01/2012  . CAD (coronary artery disease)   . Carotid artery disease ([place])   . GERD (gastroesophageal reflux disease)   . Ejection  fraction   . MIXED HYPERLIPIDEMIA 05/03/2010  . BENIGN POSITIONAL VERTIGO 05/03/2010  . Anxiety state 11/10/2008  . ANGIOEDEMA 08/18/2007  . SYSTOLIC MURMUR 35/70/1779  . DYSPHAGIA UNSPECIFIED 05/22/2007  . PROSTATITIS, HX OF 05/22/2007  . ADVEF, DRUG/MEDICINAL/BIOLOGICAL SUBST NOS 01/26/2007  . COLONIC POLYPS 12/31/2003  . GASTROESOPHAGEAL REFLUX DISEASE, CHRONIC 12/31/2003  . HIATAL HERNIA 12/31/2003  . DIVERTICULOSIS, COLON 12/31/2003  . HLD (hyperlipidemia) 05/02/2001  . HTN (hypertension) 05/02/2001   Social History   Tobacco Use  . Smoking status: Former Smoker    Packs/day: 2.00    Years: 32.00    Pack years: 64.00    Types: Cigarettes    Last attempt to quit: 08/31/1999    Years since quitting: 18.8  . Smokeless tobacco: Former Network engineer Use Topics  . Alcohol use: No    Alcohol/week: 0.0 standard drinks    Current Outpatient Medications:  .  Albuterol Sulfate (PROAIR RESPICLICK) 390 (90 Base) MCG/ACT AEPB, Inhale 18 g into the lungs every 6 (six) hours as needed. TAKE 2 PUFFS BY MOUTH EVERY 6 HOURS AS NEEDED FOR WHEEZE, Disp: 1 each, Rfl: 1 .  aspirin 81 MG tablet, Take 81 mg by mouth daily.  , Disp: , Rfl:  .  atorvastatin (LIPITOR) 40 MG tablet, Take 1 tablet (40 mg total) by mouth daily., Disp: 90 tablet, Rfl: 1 .  Fluticasone-Umeclidin-Vilant (TRELEGY ELLIPTA) 100-62.5-25 MCG/INH AEPB, Inhale 1 puff into the lungs daily., Disp: 60 each, Rfl: 0 .  metoprolol tartrate (LOPRESSOR) 50 MG tablet, TAKE 1  TABLET BY MOUTH TWICE A DAY (Patient taking differently: Take 50 mg by mouth 2 (two) times daily. ), Disp: 60 tablet, Rfl: 5 .  Multiple Vitamin (MULTIVITAMIN) capsule, Take 1 capsule by mouth daily.  , Disp: , Rfl:  .  sertraline (ZOLOFT) 50 MG tablet, TAKE 1 TABLET BY MOUTH EVERY DAY, Disp: 90 tablet, Rfl: 1 .  vitamin B-12 (CYANOCOBALAMIN) 1000 MCG tablet, Take 1,000 mcg by mouth daily., Disp: , Rfl:  Allergies  Allergen Reactions  . Avelox [Moxifloxacin Hcl In  Nacl] Other (See Comments)    Facial swelling  . Sulfamethoxazole-Trimethoprim Other (See Comments)    REACTION: sore mouth  . Aspirin Other (See Comments)    REACTION: Nausea; fine with 81mg     Assessment & Plan:   No diagnosis found.  No orders of the defined types were placed in this encounter.  No orders of the defined types were placed in this encounter.   Arnette Norris, MD 06/23/2018  Time spent with the patient: 11 minutes, spent in obtaining information about his symptoms, reviewing his previous labs, evaluations, and treatments, counseling him about his condition (please see the discussed topics above), and developing a plan to further investigate it; he had a number of questions which I addressed.   [id number] physician/qualified health professional telephone evaluation 5 to 10 minutes [id number] physician/qualified help functional [place] evaluation for 11 to 20 minutes [id number] physician/qualify he will professional telephone evaluation for 21 to 30 minutes

## 2018-08-05 ENCOUNTER — Other Ambulatory Visit: Payer: Self-pay

## 2018-08-05 ENCOUNTER — Other Ambulatory Visit: Payer: Self-pay | Admitting: Internal Medicine

## 2018-08-19 ENCOUNTER — Telehealth: Payer: Self-pay | Admitting: Internal Medicine

## 2018-08-19 NOTE — Telephone Encounter (Signed)
Called patient for COVID-19 pre-screening for in office visit.  Have you recently traveled any where out of the local area in the last 2 weeks? NO*  Have you been in close contact with a person diagnosed with COVID-19 within the last 2 weeks? NO  Do you currently have any of the following symptoms? If so, when did they start? Cough     Diarrhea   Joint Pain Fever      Muscle Pain   Red eyes Shortness of breath (YES- for years)     Abdominal pain  Vomiting Loss of smell    Rash    Sore Throat Headache    Weakness  Bruising or bleeding   Okay to proceed with visit 08/19/2018 hgfa

## 2018-08-19 NOTE — Progress Notes (Signed)
[facility] Consultation      Assessment and Plan:  The patient is a 63 year old male with severe COPD and obstructive sleep apnea. COPD with chronic dyspnea on exertion and yearly exacerbations.  Elevated eosinophil of 300, up to 600 with exacerbations. -Discussed management of COPD with patient.   -Encouraged to increase or maintain physical activity level. -Continue Trelegy inhaler  --Pneumovax administered 2016; can administer Prevnar when age 1.  --Alpha-1 normal.  - Not a candidate for lung cancer screening.  Chest tightness. - Occurs exclusively after eating, is made worse by eating larger meals, suspect this may be indigestion/GERD. - Suggested that he start taking omeprazole 20 mg daily for 2-week trial.  Obstructive sleep apnea. -Doing well with CPAP, continue using CPAP.   Chronic left basal pleural scarring with mild left mediastinal shift, as seen on CT chest.  These changes appear chronic. -Changes appear chronic, no routine follow-up imaging needed.   Date: 08/19/2018  MRN# [id number] Hayden Deleon [birth year]    Hayden Deleon is a 63 y.o. old male seen in consultation for chief complaint of:    Chief Complaint  Patient presents with  . Follow-up    pt wearing cpap avg 7hr nightly-feels pressure & mask are okay. breathing is doing well. c/o sob with exertion & occ wheezing.     HPI:   63year-old ex-smoker presents for FU of obstructive sleep apnea & COPD.  At last visit he was noted that he had some mild eosinophilia, he was asked to continue Trelegy inhaler and CPAP nightly. He has been doing well, he remains with some dyspnea on exertion which has been chronic, he feels that trelegy helps more than spiriva.  He had a chest cold back in January treated with steroids and azithro.  He has been doing well with CPAP every night. He does have some daytime sleepiness, which he attributes to going to bed late, wakes at 530am.   Patient  notes that he has episodes of chest tightness a few times weekly this occurs only after eating, and is worse if he eats more.  **CPAP download 05/22/2018- 08/19/2018.>>  Raw data present reviewed.  Average usage on days used is 6 hours 40 minutes.  Usage greater than 4 hours is 77/90 days.  CPAP set at 7.  Leaks are within normal limits.  Residual AHI is 2.  Overall this shows good compliance with CPAP with excellent control obstructive sleep apnea. **Pneumovax administered 12/30/16 **PFT 12/24/16; Spirometry: FVC was 62% of predicted, FEV1 was 33% of predicted, ratio is 43%. Flow volume loop is consistent with obstruction. There is significant improvement with bronchodilator therapy. Lung volumes: TLC is 78% of predicted, RV to TLC ratio was normal. DLCO is 49% of predicted. -Overall this test is consistent with severe COPD with reversibility, there may also be an element of mild restrictive lung disease.  **CBC 08/22/16; Eos=600 during an episode of AECOPD. At baseline he is at 300.  **Alpha-1 09/30/16>> normal.  **CT chest 09/06/16 shows severe diffuse panlobular emphysema, with some left basilar scarring, left pleural thickening, small effusion with leftward mediastinal shift.. **Sleep study 05/30/12>>AHI 35  12/28/2012 PSG showed -AHI 28 times/h  He did not sleep much on CPAP - start CPAP 7cm , pillows, humidity,  Download 10/28/12 on 7 cm - AHI 5/h, good usage, no leak  Review of Systems:  Constitutional: Feels well. Cardiovascular: Denies chest pain, exertional chest pain.  Pulmonary: Denies hemoptysis, pleuritic chest pain.   The  remainder of systems were reviewed and were found to be negative other than what is documented in the HPI.    Physical Examination:   VS: BP 134/70 (BP Location: Left Arm, Cuff Size: Normal)   Pulse (!) 57   Temp 98.4 F (36.9 C) (Oral)   Ht 5\' 11"  (1.803 m)   Wt 225 lb 3.2 oz (102.2 kg)   SpO2 95%   BMI 31.41 kg/m   General Appearance: No distress   Neuro:without focal findings, mental status, speech normal, alert and oriented HEENT: PERRLA, EOM intact Pulmonary: No wheezing, No rales  CardiovascularNormal S1,S2.  No m/r/g.  Abdomen: Benign, Soft, non-tender, No masses Renal:  No costovertebral tenderness  GU:  No performed at this time. Endoc: No evident thyromegaly, no signs of acromegaly or Cushing features Skin:   warm, no rashes, no ecchymosis  Extremities: normal, no cyanosis, clubbing. \  Allergies:  Avelox [moxifloxacin hcl in nacl]; Sulfamethoxazole-trimethoprim; and Aspirin   LABORATORY PANEL:   CBC No results for input(s): WBC, HGB, HCT, PLT in the last 168 hours. ------------------------------------------------------------------------------------------------------------------  Chemistries  No results for input(s): NA, K, CL, CO2, GLUCOSE, BUN, CREATININE, CALCIUM, MG, AST, ALT, ALKPHOS, BILITOT in the last 168 hours.  Invalid input(s): GFRCGP ------------------------------------------------------------------------------------------------------------------  Cardiac Enzymes No results for input(s): TROPONINI in the last 168 hours. ------------------------------------------------------------  RADIOLOGY:  No results found.     Thank  you for the consultation and for allowing [facility] to assist in the care of your patient. Our recommendations are noted above.  Please contact us if we can be of further service. Marda Stalker, M.D., F.C.C.P.  Board Certified in Internal Medicine, Pulmonary Medicine, [place], and Sleep Medicine.  [facility] Office Number: [phone]   08/19/2018

## 2018-08-20 ENCOUNTER — Encounter: Payer: Self-pay | Admitting: Internal Medicine

## 2018-08-20 ENCOUNTER — Ambulatory Visit (INDEPENDENT_AMBULATORY_CARE_PROVIDER_SITE_OTHER): Payer: BLUE CROSS/BLUE SHIELD | Admitting: Internal Medicine

## 2018-08-20 ENCOUNTER — Other Ambulatory Visit: Payer: Self-pay

## 2018-08-20 VITALS — BP 134/70 | HR 57 | Temp 98.4°F | Ht 71.0 in | Wt 225.2 lb

## 2018-08-20 DIAGNOSIS — G4733 Obstructive sleep apnea (adult) (pediatric): Secondary | ICD-10-CM

## 2018-08-20 DIAGNOSIS — J449 Chronic obstructive pulmonary disease, unspecified: Secondary | ICD-10-CM | POA: Diagnosis not present

## 2018-08-20 MED ORDER — FLUTICASONE-UMECLIDIN-VILANT 100-62.5-25 MCG/INH IN AEPB: 1.0000 | INHALATION_SPRAY | Freq: Every day | RESPIRATORY_TRACT | 2 refills | Status: DC

## 2018-08-20 NOTE — Addendum Note (Signed)
Addended by: Maryanna Shape A on: 08/20/2018 09:07 AM   Modules accepted: Orders

## 2018-08-20 NOTE — Patient Instructions (Addendum)
Continue using Trelegy daily and CPAP at night.  Start a 2 week trial of omeprazole (prilosec) once daily to see if the food related chest discomfort goes away.

## 2018-12-08 ENCOUNTER — Other Ambulatory Visit: Payer: Self-pay | Admitting: Internal Medicine

## 2018-12-08 MED ORDER — TRELEGY ELLIPTA 100-62.5-25 MCG/INH IN AEPB: 1.0000 | INHALATION_SPRAY | Freq: Every day | RESPIRATORY_TRACT | 2 refills | Status: DC

## 2018-12-09 ENCOUNTER — Other Ambulatory Visit: Payer: Self-pay

## 2018-12-09 MED ORDER — ATORVASTATIN CALCIUM 40 MG PO TABS: 40.0000 mg | ORAL_TABLET | Freq: Every day | ORAL | 0 refills | Status: DC

## 2018-12-10 ENCOUNTER — Other Ambulatory Visit: Payer: Self-pay | Admitting: Family Medicine

## 2018-12-10 MED ORDER — SERTRALINE HCL 50 MG PO TABS: 50.0000 mg | ORAL_TABLET | Freq: Every day | ORAL | 0 refills | Status: DC

## 2018-12-10 NOTE — Telephone Encounter (Signed)
Medication Refill - Medication: sertraline (ZOLOFT) 50 MG tablet    Has the patient contacted their pharmacy? Yes.   Pt states he has been out of this medication for 5 days. Please advise.  (Agent: If no, request that the patient contact the pharmacy for the refill.) (Agent: If yes, when and what did the pharmacy advise?)  Preferred Pharmacy (with phone number or street name):  [id number] - [place], [address] [place] [address]  Phone: [phone] Fax: [phone]  Not a 24 hour pharmacy; exact hours not known.     Agent: Please be advised that RX refills may take up to 3 business days. We ask that you follow-up with your pharmacy.

## 2018-12-10 NOTE — Telephone Encounter (Signed)
Requested medication (s) are due for refill today: yes  Requested medication (s) are on the active medication list: yes  Last refill:  12/2017  Future visit scheduled: no Notes to clinic:  Review for refill  Patient has been out for 5 days  Requested Prescriptions  Pending Prescriptions Disp Refills   sertraline (ZOLOFT) 50 MG tablet 90 tablet 1    Sig: Take 1 tablet (50 mg total) by mouth daily.     Psychiatry:  Antidepressants - SSRI Failed - 12/10/2018  9:56 AM      Failed - Completed PHQ-2 or PHQ-9 in the last 360 days.      Passed - Valid encounter within last 6 months    Recent Outpatient Visits          5 months ago Chronic obstructive pulmonary disease, unspecified COPD type ([place])   [facility] [place], Marciano Sequin, MD   6 months ago Low testosterone   [facility] [place], [place] M, MD   8 months ago Acute recurrent frontal sinusitis   [facility] [place], [place] M, MD   10 months ago Acute gout involving toe of right foot, unspecified cause   [facility] [place], Marciano Sequin, MD   1 year ago Rash and nonspecific skin eruption   [facility] [address] Marciano Sequin, MD

## 2018-12-21 ENCOUNTER — Other Ambulatory Visit: Payer: Self-pay

## 2018-12-21 ENCOUNTER — Ambulatory Visit (INDEPENDENT_AMBULATORY_CARE_PROVIDER_SITE_OTHER): Payer: BC Managed Care – PPO | Admitting: Family Medicine

## 2018-12-21 ENCOUNTER — Encounter: Payer: Self-pay | Admitting: Family Medicine

## 2018-12-21 VITALS — BP 134/74 | HR 54 | Temp 98.9°F | Wt 226.2 lb

## 2018-12-21 DIAGNOSIS — Z125 Encounter for screening for malignant neoplasm of prostate: Secondary | ICD-10-CM | POA: Diagnosis not present

## 2018-12-21 DIAGNOSIS — E782 Mixed hyperlipidemia: Secondary | ICD-10-CM | POA: Diagnosis not present

## 2018-12-21 DIAGNOSIS — I2510 Atherosclerotic heart disease of native coronary artery without angina pectoris: Secondary | ICD-10-CM | POA: Diagnosis not present

## 2018-12-21 DIAGNOSIS — R7989 Other specified abnormal findings of blood chemistry: Secondary | ICD-10-CM | POA: Diagnosis not present

## 2018-12-21 DIAGNOSIS — I779 Disorder of arteries and arterioles, unspecified: Secondary | ICD-10-CM

## 2018-12-21 DIAGNOSIS — E785 Hyperlipidemia, unspecified: Secondary | ICD-10-CM | POA: Diagnosis not present

## 2018-12-21 DIAGNOSIS — I739 Peripheral vascular disease, unspecified: Secondary | ICD-10-CM

## 2018-12-21 DIAGNOSIS — F411 Generalized anxiety disorder: Secondary | ICD-10-CM

## 2018-12-21 DIAGNOSIS — J449 Chronic obstructive pulmonary disease, unspecified: Secondary | ICD-10-CM

## 2018-12-21 DIAGNOSIS — I25118 Atherosclerotic heart disease of native coronary artery with other forms of angina pectoris: Secondary | ICD-10-CM | POA: Insufficient documentation

## 2018-12-21 DIAGNOSIS — I10 Essential (primary) hypertension: Secondary | ICD-10-CM | POA: Diagnosis not present

## 2018-12-21 LAB — COMPREHENSIVE METABOLIC PANEL
ALT: 24 U/L (ref 0–53)
AST: 22 U/L (ref 0–37)
Albumin: 4.3 g/dL (ref 3.5–5.2)
Alkaline Phosphatase: 60 U/L (ref 39–117)
BUN: 11 mg/dL (ref 6–23)
CO2: 31 mEq/L (ref 19–32)
Calcium: 10 mg/dL (ref 8.4–10.5)
Chloride: 105 mEq/L (ref 96–112)
Creatinine, Ser: 0.99 mg/dL (ref 0.40–1.50)
GFR: 76.34 mL/min (ref >60.00)
Glucose, Bld: 95 mg/dL (ref 70–99)
Potassium: 4.7 mEq/L (ref 3.5–5.1)
Sodium: 143 mEq/L (ref 135–145)
Total Bilirubin: 0.9 mg/dL (ref 0.2–1.2)
Total Protein: 6.5 g/dL (ref 6.0–8.3)

## 2018-12-21 LAB — LIPID PANEL
Cholesterol: 134 mg/dL (ref 0–200)
HDL: 44.6 mg/dL (ref >39.00)
LDL Cholesterol: 66 mg/dL (ref 0–99)
NonHDL: 89.35
Total CHOL/HDL Ratio: 3
Triglycerides: 115 mg/dL (ref 0.0–149.0)
VLDL: 23 mg/dL (ref 0.0–40.0)

## 2018-12-21 LAB — PSA: PSA: 1.18 ng/mL (ref 0.10–4.00)

## 2018-12-21 LAB — TESTOSTERONE: Testosterone: 302.84 ng/dL (ref 300.00–890.00)

## 2018-12-21 MED ORDER — SERTRALINE HCL 50 MG PO TABS: 50.0000 mg | ORAL_TABLET | Freq: Every day | ORAL | 1 refills | Status: DC

## 2018-12-21 MED ORDER — PROAIR RESPICLICK 108 (90 BASE) MCG/ACT IN AEPB: 2.0000 | INHALATION_SPRAY | Freq: Four times a day (QID) | RESPIRATORY_TRACT | 99 refills | Status: DC | PRN

## 2018-12-21 MED ORDER — ATORVASTATIN CALCIUM 40 MG PO TABS: 40.0000 mg | ORAL_TABLET | Freq: Every day | ORAL | 1 refills | Status: DC

## 2018-12-21 MED ORDER — METOPROLOL TARTRATE 50 MG PO TABS: 50.0000 mg | ORAL_TABLET | Freq: Two times a day (BID) | ORAL | 1 refills | Status: DC

## 2018-12-21 NOTE — Assessment & Plan Note (Signed)
Discussed how testosterone can impact the heart.

## 2018-12-21 NOTE — Progress Notes (Addendum)
Subjective:   Patient ID: Hayden Deleon, male    DOB: 1955/12/22, 63 y.o.   MRN: [id number]  Hayden Deleon is a pleasant 63 y.o. year old male who presents to clinic today with Hypogonadism (Pt is here today to discuss low-testosterone. He is currently fasting and would like a lipid panel PSA. He will get his flu shot 10.6.20 at work.)  on 12/21/2018  HPI:  Low Testerone-  Lab Results  Component Value Date   TESTOSTERONE 380.34 06/03/2018   HLD- currently taking Lipitor 40 mg daily.  Lab Results  Component Value Date   CHOL 142 04/02/2017   HDL 40.50 04/02/2017   LDLCALC 70 04/02/2017   LDLDIRECT 71.0 01/03/2016   TRIG 158.0 (H) 04/02/2017   CHOLHDL 4 04/02/2017   The 10-year ASCVD risk score Mikey Bussing DC Jr., et al., 2013) is: 12%   Values used to calculate the score:     Age: 63 years     Sex: Male     Is Non-Hispanic African American: No     Diabetic: No     Tobacco smoker: No     Systolic Blood Pressure: 379 mmHg     Is BP treated: Yes     HDL Cholesterol: 40.5 mg/dL     Total Cholesterol: 142 mg/dL  GAD-  Depression screen [facility] 2/9 12/21/2018 04/02/2017  Decreased Interest 0 0  Down, Depressed, Hopeless 0 0  PHQ - 2 Score 0 0      Current Outpatient Medications on File Prior to Visit  Medication Sig Dispense Refill  . aspirin 81 MG tablet Take 81 mg by mouth daily.      . Fluticasone-Umeclidin-Vilant (TRELEGY ELLIPTA) 100-62.5-25 MCG/INH AEPB Inhale 1 puff into the lungs daily. 180 each 2  . Multiple Vitamin (MULTIVITAMIN) capsule Take 1 capsule by mouth daily.      . vitamin B-12 (CYANOCOBALAMIN) 1000 MCG tablet Take 1,000 mcg by mouth daily.     No current facility-administered medications on file prior to visit.     Allergies  Allergen Reactions  . Avelox [Moxifloxacin Hcl In Nacl] Other (See Comments)    Facial swelling  . Sulfamethoxazole-Trimethoprim Other (See Comments)    REACTION: sore mouth  . Aspirin Other (See Comments)    REACTION:  Nausea; fine with 21m    Past Medical History:  Diagnosis Date  . Allergy   . Anxiety   . Arthritis    right shoulder  . CAD (coronary artery disease)    Catheterization, 2003, occluded OM1, good collaterals  //   nuclear, August, 2009, low risk, EF 60%  . Carotid artery disease ([place]    Doppler, February, 2012, 00-24%R. ICA, 409-73%LICA,  plan followup 1 year  . COPD (chronic obstructive pulmonary disease) ([facility])    Significant Dr. BLamonte Sakai . Diverticulosis of colon   . Ejection fraction    EF 60%, nuclear, 2009  . GERD (gastroesophageal reflux disease)   . Gout   . Hyperlipidemia   . Hypertension   . Prostatitis   . Sleep apnea    uses cpap    Past Surgical History:  Procedure Laterality Date  . COLONOSCOPY  12/2003   [facility] - polyp  . deviated septum    . KNEE ARTHROSCOPY     right  . TOTAL SHOULDER ARTHROPLASTY Right 02/03/2018   Procedure: RIGHT REVERSE SHOULDER ARTHROPLASTY;  Surgeon: DMeredith Pel MD;  [facility]  Service: Orthopedics;  Laterality: Right;  .  WISDOM TOOTH EXTRACTION      Family History  Problem Relation Age of Onset  . Angina Mother        car accident  . Asthma Father   . Crohn's disease Sister   . Colon cancer Neg Hx   . Esophageal cancer Neg Hx   . Rectal cancer Neg Hx   . Stomach cancer Neg Hx     Social History   Socioeconomic History  . Marital status: Married    Spouse name: Not on file  . Number of children: 0  . Years of education: Not on file  . Highest education level: Not on file  Occupational History  . Occupation: Freight forwarder at [employer]    Employer: [employer]  Social Needs  . Financial resource strain: Not on file  . Food insecurity    Worry: Not on file    Inability: Not on file  . Transportation needs    Medical: Not on file    Non-medical: Not on file  Tobacco Use  . Smoking status: Former Smoker    Packs/day: 2.00    Years: 32.00    Pack years: 64.00    Types: Cigarettes     Quit date: 08/31/1999    Years since quitting: 19.3  . Smokeless tobacco: Former Network engineer and Sexual Activity  . Alcohol use: No    Alcohol/week: 0.0 standard drinks  . Drug use: No  . Sexual activity: Not on file  Lifestyle  . Physical activity    Days per week: Not on file    Minutes per session: Not on file  . Stress: Not on file  Relationships  . Social Herbalist on phone: Not on file    Gets together: Not on file    Attends religious service: Not on file    Active member of club or organization: Not on file    Attends meetings of clubs or organizations: Not on file    Relationship status: Not on file  . Intimate partner violence    Fear of current or ex partner: Not on file    Emotionally abused: Not on file    Physically abused: Not on file    Forced sexual activity: Not on file  Other Topics Concern  . Not on file  Social History Narrative  . Not on file   The PMH, PSH, Social History, Family History, Medications, and allergies have been reviewed in [facility], and have been updated if relevant.  Review of Systems  Constitutional: Positive for fatigue.  HENT: Negative.   Respiratory: Negative.   Cardiovascular: Negative.   Gastrointestinal: Negative.   Endocrine: Negative.   Genitourinary: Negative.   Musculoskeletal: Negative.   Allergic/Immunologic: Negative.   Neurological: Negative.   Hematological: Negative.   Psychiatric/Behavioral: Negative.   All other systems reviewed and are negative.      Objective:    BP 134/74 (BP Location: Left Arm, Patient Position: Sitting, Cuff Size: Normal)   Pulse (!) 54   Temp 98.9 F (37.2 C) (Oral)   Wt 226 lb 3.2 oz (102.6 kg)   SpO2 95%   BMI 31.55 kg/m    Physical Exam        Assessment & Plan:   Essential hypertension - Plan: Comp Met (CMET)  Bilateral carotid artery disease (HCC) - Plan: metoprolol tartrate (LOPRESSOR) 50 MG tablet  Coronary artery disease involving native  coronary artery of native heart without angina pectoris - Plan:  metoprolol tartrate (LOPRESSOR) 50 MG tablet  Low testosterone - Plan: Testosterone, Testosterone, free  Mixed hyperlipidemia - Plan: Lipid Profile  Screening for malignant neoplasm of prostate - Plan: PSA  Coronary artery disease of native artery of native heart with stable angina pectoris ([place]), Chronic No follow-ups on file.

## 2018-12-21 NOTE — Assessment & Plan Note (Signed)
Doing pretty well with breathing.  Trelegy has made a big difference.

## 2018-12-21 NOTE — Assessment & Plan Note (Signed)
Well controlled on current rxs. eRx refills sent.

## 2018-12-21 NOTE — Assessment & Plan Note (Signed)
>  25 minutes spent in face to face time with patient, >50% spent in counselling or coordination of care discussing low T, CAD, HLD and HTN.  Testosterone MIGHT improve libido, mood, strength, and lean body mass. But it's not likely to solve erectile dysfunction...because that's usually a vascular problem.   On the other hand, testosterone can lead to gynecomastia, edema, and polycythemia (thickening of your blood). It MIGHT also increase cardiac risk...and worsen sleep apnea, severe BPH (enlargement of prostate), and prostate cancer.   Save testosterone for men with significant symptoms.Hayden KitchenMarland KitchenAND a low MORNING testosterone level on TWO separate occasions.  Look for a total testosterone level < 300 ng/dL. If total testosterone is borderline, check a free testosterone level.     He agreed to check labs today.  Orders Placed This Encounter  Procedures  . Lipid Profile  . PSA  . Testosterone  . Testosterone, free  . Comp Met (CMET)

## 2018-12-21 NOTE — Patient Instructions (Addendum)
Testosterone MIGHT improve libido, mood, strength, and lean body mass. But it's not likely to solve erectile dysfunction...because that's usually a vascular problem.   On the other hand, testosterone can lead to gynecomastia, edema, and polycythemia (thickening of your blood). It MIGHT also increase cardiac risk...and worsen sleep apnea, severe BPH (enlargement of prostate), and prostate cancer.   We recommend testosterone only for significant symptoms.Marland KitchenMarland KitchenAND a low MORNING testosterone level on TWO separate occasions.  Look for a total testosterone level < 300 ng/dL. If total testosterone is borderline, check a free testosterone level.    Great to see you. I will call you with your lab results from today and you can view them online.

## 2018-12-21 NOTE — Assessment & Plan Note (Signed)
Compliant with lipitor 40 mg daily. Check labs today.  Orders Placed This Encounter  Procedures  . Lipid Profile  . PSA  . Testosterone  . Testosterone, free  . Comp Met (CMET)

## 2018-12-28 LAB — TESTOSTERONE, FREE: TESTOSTERONE FREE: 38.9 pg/mL — ABNORMAL LOW (ref 46.0–224.0)

## 2019-01-05 ENCOUNTER — Other Ambulatory Visit: Payer: Self-pay | Admitting: Cardiovascular Disease

## 2019-01-05 DIAGNOSIS — I6523 Occlusion and stenosis of bilateral carotid arteries: Secondary | ICD-10-CM

## 2019-01-22 ENCOUNTER — Encounter: Payer: Self-pay | Admitting: Gastroenterology

## 2019-02-03 ENCOUNTER — Ambulatory Visit (INDEPENDENT_AMBULATORY_CARE_PROVIDER_SITE_OTHER): Payer: BC Managed Care – PPO

## 2019-02-03 ENCOUNTER — Other Ambulatory Visit: Payer: Self-pay

## 2019-02-03 DIAGNOSIS — I6523 Occlusion and stenosis of bilateral carotid arteries: Secondary | ICD-10-CM

## 2019-02-04 ENCOUNTER — Other Ambulatory Visit: Payer: Self-pay

## 2019-02-04 DIAGNOSIS — I6523 Occlusion and stenosis of bilateral carotid arteries: Secondary | ICD-10-CM

## 2019-02-05 ENCOUNTER — Ambulatory Visit (INDEPENDENT_AMBULATORY_CARE_PROVIDER_SITE_OTHER): Payer: BC Managed Care – PPO | Admitting: Cardiovascular Disease

## 2019-02-05 ENCOUNTER — Other Ambulatory Visit: Payer: Self-pay

## 2019-02-05 ENCOUNTER — Encounter: Payer: Self-pay | Admitting: Cardiovascular Disease

## 2019-02-05 VITALS — BP 120/60 | HR 67 | Ht 71.0 in | Wt 225.0 lb

## 2019-02-05 DIAGNOSIS — I10 Essential (primary) hypertension: Secondary | ICD-10-CM

## 2019-02-05 DIAGNOSIS — I25118 Atherosclerotic heart disease of native coronary artery with other forms of angina pectoris: Secondary | ICD-10-CM | POA: Diagnosis not present

## 2019-02-05 DIAGNOSIS — I6523 Occlusion and stenosis of bilateral carotid arteries: Secondary | ICD-10-CM

## 2019-02-05 DIAGNOSIS — E785 Hyperlipidemia, unspecified: Secondary | ICD-10-CM | POA: Diagnosis not present

## 2019-02-05 NOTE — Patient Instructions (Signed)
Medication Instructions:  Your physician recommends that you continue on your current medications as directed. Please refer to the Current Medication list given to you today.  *If you need a refill on your cardiac medications before your next appointment, please call your pharmacy*  Lab Work: None ordered If you have labs (blood work) drawn today and your tests are completely normal, you will receive your results only by: . MyChart Message (if you have MyChart) OR . A paper copy in the mail If you have any lab test that is abnormal or we need to change your treatment, we will call you to review the results.  Testing/Procedures: None ordered  Follow-Up: At [facility], you and your health needs are our priority.  As part of our continuing mission to provide you with exceptional heart care, we have created designated Provider Care Teams.  These Care Teams include your primary Cardiologist (physician) and Advanced Practice Providers (APPs -  Physician Assistants and Nurse Practitioners) who all work together to provide you with the care you need, when you need it.  Your next appointment:   12 months  The format for your next appointment:   In Person  Provider:    You may see Dr. Arida or one of the following Advanced Practice Providers on your designated Care Team:    Christopher Berge, NP  Ryan Dunn, PA-C  Jacquelyn Visser, PA-C   Other Instructions N/A  

## 2019-02-05 NOTE — Progress Notes (Signed)
Cardiology Office Note   Date:  02/05/2019   ID:  Hayden Deleon, [place] Jul 22, 1955, MRN [id number]  PCP:  Lucille Passy, MD  Cardiologist:   [name], MD   Chief Complaint  Patient presents with  . Other    12 month follow up. Patient c.o chest pain with exertion. Patient states it is worse after he eats. Meds reviewed verbally with patient.       History of Present Illness: Hayden Deleon is a 63 y.o. male who is here today for follow-up visit regarding coronary artery disease and stable angina. Previous cardiac catheterization in 2003 showed occluded OM1 with collaterals with 60-70% proximal RCA stenosis.  Ejection fraction was normal.  The patient has been managed medically since then.  He has known history of COPD and quit smoking in 2005.  He has hyperlipidemia, carotid disease and hypertension. He has prolonged history of stable angina with mild chest pain happening with overexertion.  The pattern has been stable for many years. He had a pharmacologic nuclear stress test done in October 2017 which was normal.  Echocardiogram in November 2017 was also normal. He did not tolerate Imdur in the past due to headache.  Echocardiogram in December of last year showed normal LV systolic function with no significant valvular abnormalities.  He has been doing reasonably well and reports stable exertional chest pain which has not changed for many years.  It is associated with mild shortness of breath.  He takes his medications regularly.   Past Medical History:  Diagnosis Date  . Allergy   . Anxiety   . Arthritis    right shoulder  . CAD (coronary artery disease)    Catheterization, 2003, occluded OM1, good collaterals  //   nuclear, August, 2009, low risk, EF 60%  . Carotid artery disease ([place])    Doppler, February, 2012, [id number] R. ICA, [id number] LICA,  plan followup 1 year  . COPD (chronic obstructive pulmonary disease) (H[facility])    Significant Dr. Lamonte Sakai  . Diverticulosis of  colon   . Ejection fraction    EF 60%, nuclear, 2009  . GERD (gastroesophageal reflux disease)   . Gout   . Hyperlipidemia   . Hypertension   . Prostatitis   . Sleep apnea    uses cpap    Past Surgical History:  Procedure Laterality Date  . COLONOSCOPY  12/2003   [facility] - polyp  . deviated septum    . KNEE ARTHROSCOPY     right  . TOTAL SHOULDER ARTHROPLASTY Right 02/03/2018   Procedure: RIGHT REVERSE SHOULDER ARTHROPLASTY;  Surgeon: Meredith Pel, MD;  Location: [facility];  Service: Orthopedics;  Laterality: Right;  . WISDOM TOOTH EXTRACTION       Current Outpatient Medications  Medication Sig Dispense Refill  . Albuterol Sulfate (PROAIR RESPICLICK) 123XX123 (90 Base) MCG/ACT AEPB Inhale 2 puffs into the lungs every 6 (six) hours as needed. 1 each PRN  . aspirin 81 MG tablet Take 81 mg by mouth daily.      [name] atorvastatin (LIPITOR) 40 MG tablet Take 1 tablet (40 mg total) by mouth daily. 90 tablet 1  . Fluticasone-Umeclidin-Vilant (TRELEGY ELLIPTA) 100-62.5-25 MCG/INH AEPB Inhale 1 puff into the lungs daily. 180 each 2  . metoprolol tartrate (LOPRESSOR) 50 MG tablet Take 1 tablet (50 mg total) by mouth 2 (two) times daily. 180 tablet 1  . Multiple Vitamin (MULTIVITAMIN) capsule Take 1 capsule by mouth daily.      [name]  sertraline (ZOLOFT) 50 MG tablet Take 1 tablet (50 mg total) by mouth daily. 90 tablet 1  . vitamin B-12 (CYANOCOBALAMIN) 1000 MCG tablet Take 1,000 mcg by mouth daily.     No current facility-administered medications for this visit.     Allergies:   Avelox [moxifloxacin hcl in nacl], Sulfamethoxazole-trimethoprim, and Aspirin    Social History:  The patient  reports that he quit smoking about 19 years ago. His smoking use included cigarettes. He has a 64.00 pack-year smoking history. He has quit using smokeless tobacco. He reports that he does not drink alcohol or use drugs.   Family History:  The patient's family history includes Angina in his mother; Asthma in  his father; Crohn's disease in his sister.    ROS:  Please see the history of present illness.   Otherwise, review of systems are positive for none.   All other systems are reviewed and negative.    PHYSICAL EXAM: VS:  BP 120/60 (BP Location: Left Arm, Patient Position: Sitting, Cuff Size: Normal)   Pulse 67   Ht 5\' 11"  (1.803 m)   Wt 225 lb (102.1 kg)   BMI 31.38 kg/m  , BMI Body mass index is 31.38 kg/m. GEN: Well nourished, well developed, in no acute distress  HEENT: normal  Neck: no JVD, carotid bruits, or masses Cardiac: RRR; no  rubs, or gallops,no edema . 1/6 holosystolic murmur at the apex. Respiratory:  clear to auscultation bilaterally, normal work of breathing GI: soft, nontender, nondistended, + BS MS: no deformity or atrophy  Skin: warm and dry, no rash Neuro:  Strength and sensation are intact Psych: euthymic mood, full affect   EKG:  EKG is ordered today. The ekg ordered today demonstrates normal sinus rhythm with no significant ST or T wave changes.   Recent Labs: 12/21/2018: ALT 24; BUN 11; Creatinine, Ser 0.99; Potassium 4.7; Sodium 143    Lipid Panel    Component Value Date/Time   CHOL 134 12/21/2018 0911   TRIG 115.0 12/21/2018 0911   HDL 44.60 12/21/2018 0911   CHOLHDL 3 12/21/2018 0911   VLDL 23.0 12/21/2018 0911   LDLCALC 66 12/21/2018 0911   LDLDIRECT 71.0 01/03/2016 1201      Wt Readings from Last 3 Encounters:  02/05/19 225 lb (102.1 kg)  12/21/18 226 lb 3.2 oz (102.6 kg)  08/20/18 225 lb 3.2 oz (102.2 kg)       PAD Screen 01/17/2016  Previous PAD dx? No  Previous surgical procedure? No  Pain with walking? Yes  Subsides with rest? Yes  Feet/toe relief with dangling? No  Painful, non-healing ulcers? No  Extremities discolored? No      ASSESSMENT AND PLAN:  1.  Coronary artery disease involving native coronary arteries with stable class II angina: The patient's symptoms have been stable overall.  He had a nuclear stress test  in 2017 which was normal.  His EKG today is normal.  Continue medical therapy.  He did not tolerate Imdur in the past due to headache.  2.  Hyperlipidemia: Continue treatment with atorvastatin.  Most recent lipid profile showed an LDL of 66.  3.  Essential hypertension: Blood pressure is controlled on metoprolol.  4.  Left carotid stenosis: This was stable on most recent carotid Doppler 2 days ago.  Repeat study in 1 year.    Disposition:   FU with me in 12 months  Signed,   [name], MD  02/05/2019 3:53 PM    [facility]

## 2019-03-03 ENCOUNTER — Other Ambulatory Visit: Payer: Self-pay

## 2019-03-03 ENCOUNTER — Telehealth: Payer: Self-pay | Admitting: Orthopedic Surgery

## 2019-03-03 MED ORDER — AMOXICILLIN 500 MG PO TABS: ORAL_TABLET | ORAL | 0 refills | Status: DC

## 2019-03-03 NOTE — Telephone Encounter (Signed)
Submitted to pharmacy. Patient aware.

## 2019-03-03 NOTE — Telephone Encounter (Signed)
Patient states he has a dental work (root canal) scheduled for Monday 03/08/19 & wants to know if he need pre meds before.

## 2019-03-04 ENCOUNTER — Telehealth: Payer: Self-pay

## 2019-03-04 NOTE — Telephone Encounter (Signed)
Pharmacy called stating patient had allergy to amox. I advised ok to change to clinda 300mg  2 po 1 hr prior to dental procedure #6

## 2019-04-04 NOTE — Progress Notes (Signed)
Virtual Visit via Video   Due to the COVID-19 pandemic, this visit was completed with telemedicine (audio/video) technology to reduce patient and provider exposure as well as to preserve personal protective equipment.   I connected with Hayden Deleon by a video enabled telemedicine application and verified that I am speaking with the correct person using two identifiers. Location patient: Home Location provider: [facility], Office Persons participating in the virtual visit: Sevion Shutt, Arnette Norris, MD   I discussed the limitations of evaluation and management by telemedicine and the availability of in person appointments. The patient expressed understanding and agreed to proceed.  Interactive audio and video telecommunications were attempted between this provider and patient, however failed, due to patient having technical difficulties OR patient did not have access to video capability.  We continued and completed visit with audio only.  Care Team   Patient Care Team: Lucille Passy, MD as PCP - General  Subjective:   HPI:   No fever, , cough, congestion, runny nose,fatigue, body aches, sore throat, headache, nausea, vomiting, diarrhea, or new loss of taste or smell. No known contacts with covid 19 or someone being tested for covid 19.    He has h/o COPD and feels this is his typical yearly COPD exacerbation- chest feels tight, wheezing more.     Review of Systems  Constitutional: Negative for chills, fever, malaise/fatigue and weight loss.  HENT: Negative.  Negative for congestion and hearing loss.   Eyes: Negative.  Negative for blurred vision, discharge and redness.  Respiratory: Positive for cough, sputum production, shortness of breath and wheezing. Negative for hemoptysis.   Cardiovascular: Negative for chest pain, palpitations and leg swelling.  Gastrointestinal: Negative.  Negative for abdominal pain and heartburn.  Genitourinary: Negative.  Negative for  dysuria.  Musculoskeletal: Negative.  Negative for falls.  Skin: Negative.  Negative for rash.  Neurological: Negative.  Negative for loss of consciousness and headaches.  Endo/Heme/Allergies: Negative.  Does not bruise/bleed easily.  Psychiatric/Behavioral: Negative.  Negative for depression.  All other systems reviewed and are negative.    Patient Active Problem List   Diagnosis Date Noted  . Coronary artery disease of native artery of native heart with stable angina pectoris ([place]) 12/21/2018  . Shoulder arthritis 02/03/2018  . Gout 01/21/2018  . Low testosterone 01/21/2018  . BPV (benign positional vertigo), left 07/10/2017  . Vitamin B12 deficiency 04/02/2017  . COPD (chronic obstructive pulmonary disease) ([place]) 10/07/2014  . Inguinal hernia of right side without obstruction or gangrene 07/31/2012  . OSA (obstructive sleep apnea) 05/01/2012  . CAD (coronary artery disease)   . Carotid artery disease ([place])   . GERD (gastroesophageal reflux disease)   . Ejection fraction   . MIXED HYPERLIPIDEMIA 05/03/2010  . BENIGN POSITIONAL VERTIGO 05/03/2010  . Anxiety state 11/10/2008  . ANGIOEDEMA 08/18/2007  . SYSTOLIC MURMUR 0000000  . DYSPHAGIA UNSPECIFIED 05/22/2007  . PROSTATITIS, HX OF 05/22/2007  . ADVEF, DRUG/MEDICINAL/BIOLOGICAL SUBST NOS 01/26/2007  . COLONIC POLYPS 12/31/2003  . GASTROESOPHAGEAL REFLUX DISEASE, CHRONIC 12/31/2003  . HIATAL HERNIA 12/31/2003  . DIVERTICULOSIS, COLON 12/31/2003  . HLD (hyperlipidemia) 05/02/2001  . HTN (hypertension) 05/02/2001    Social History   Tobacco Use  . Smoking status: Former Smoker    Packs/day: 2.00    Years: 32.00    Pack years: 64.00    Types: Cigarettes    Quit date: 08/31/1999    Years since quitting: 19.6  . Smokeless tobacco: Former Network engineer  Use Topics  . Alcohol use: No    Alcohol/week: 0.0 standard drinks    Current Outpatient Medications:  .  Albuterol Sulfate (PROAIR RESPICLICK) 123XX123 (90 Base)  MCG/ACT AEPB, Inhale 2 puffs into the lungs every 6 (six) hours as needed., Disp: 1 each, Rfl: PRN .  amoxicillin (AMOXIL) 500 MG tablet, 2g po 1 hr prior to dental procedure, Disp: 10 tablet, Rfl: 0 .  aspirin 81 MG tablet, Take 81 mg by mouth daily.  , Disp: , Rfl:  .  atorvastatin (LIPITOR) 40 MG tablet, Take 1 tablet (40 mg total) by mouth daily., Disp: 90 tablet, Rfl: 1 .  azithromycin (ZITHROMAX) 250 MG tablet, 2 tabs by mouth on day 1 followed 1 tab by mouth daily days 2-5, Disp: 6 tablet, Rfl: 0 .  Fluticasone-Umeclidin-Vilant (TRELEGY ELLIPTA) 100-62.5-25 MCG/INH AEPB, Inhale 1 puff into the lungs daily., Disp: 180 each, Rfl: 2 .  metoprolol tartrate (LOPRESSOR) 50 MG tablet, Take 1 tablet (50 mg total) by mouth 2 (two) times daily., Disp: 180 tablet, Rfl: 1 .  Multiple Vitamin (MULTIVITAMIN) capsule, Take 1 capsule by mouth daily.  , Disp: , Rfl:  .  predniSONE (DELTASONE) 10 MG tablet, 3 tabs by mouth x 3 days, 2 tabs by mouth x 2 days, 1 tab by mouth x 2 days and stop., Disp: 15 tablet, Rfl: 0 .  sertraline (ZOLOFT) 50 MG tablet, Take 1 tablet (50 mg total) by mouth daily., Disp: 90 tablet, Rfl: 1 .  vitamin B-12 (CYANOCOBALAMIN) 1000 MCG tablet, Take 1,000 mcg by mouth daily., Disp: , Rfl:   Allergies  Allergen Reactions  . Avelox [Moxifloxacin Hcl In Nacl] Other (See Comments)    Facial swelling  . Sulfamethoxazole-Trimethoprim Other (See Comments)    REACTION: sore mouth  . Aspirin Other (See Comments)    REACTION: Nausea; fine with 81mg     Objective:  Temp 97.9 F (36.6 C)   VITALS: Per patient if applicable, see vitals. GENERAL: Alert, appears well and in no acute distress. HEENT: Atraumatic, conjunctiva clear, no obvious abnormalities on inspection of external nose and ears. NECK: Normal movements of the head and neck. CARDIOPULMONARY: No increased WOB. Speaking in clear sentences. I:E ratio WNL.  MS: Moves all visible extremities without noticeable  abnormality. PSYCH: Pleasant and cooperative, well-groomed. Speech normal rate and rhythm. Affect is appropriate. Insight and judgement are appropriate. Attention is focused, linear, and appropriate.  NEURO: CN grossly intact. Oriented as arrived to appointment on time with no prompting. Moves both UE equally.  SKIN: No obvious lesions, wounds, erythema, or cyanosis noted on face or hands.  Depression screen [facility] 2/9 12/21/2018 04/02/2017  Decreased Interest 0 0  Down, Depressed, Hopeless 0 0  PHQ - 2 Score 0 0     . COVID-19 Education: The signs and symptoms of COVID-19 were discussed with the patient and how to seek care for testing if needed. The importance of social distancing was discussed today. . Reviewed expectations re: course of current medical issues. . Discussed self-management of symptoms. . Outlined signs and symptoms indicating need for more acute intervention. . Patient verbalized understanding and all questions were answered. [name] Health Maintenance issues including appropriate healthy diet, exercise, and smoking avoidance were discussed with patient. . See orders for this visit as documented in the electronic medical record.  Arnette Norris, MD  Records requested if needed. Time spent: 15 minutes, of which >50% was spent in obtaining information about his symptoms, reviewing his previous labs, evaluations, and  treatments, counseling him about his condition (please see the discussed topics above), and developing a plan to further investigate it; he had a number of questions which I addressed.   Lab Results  Component Value Date   WBC 14.0 (H) 01/26/2018   HGB 15.5 01/26/2018   HCT 47.9 01/26/2018   PLT 223 01/26/2018   GLUCOSE 95 12/21/2018   CHOL 134 12/21/2018   TRIG 115.0 12/21/2018   HDL 44.60 12/21/2018   LDLDIRECT 71.0 01/03/2016   LDLCALC 66 12/21/2018   ALT 24 12/21/2018   AST 22 12/21/2018   NA 143 12/21/2018   K 4.7 12/21/2018   CL 105 12/21/2018   CREATININE  0.99 12/21/2018   BUN 11 12/21/2018   CO2 31 12/21/2018   TSH 1.54 01/03/2016   PSA 1.18 12/21/2018    Lab Results  Component Value Date   TSH 1.54 01/03/2016   Lab Results  Component Value Date   WBC 14.0 (H) 01/26/2018   HGB 15.5 01/26/2018   HCT 47.9 01/26/2018   MCV 99.0 01/26/2018   PLT 223 01/26/2018   Lab Results  Component Value Date   NA 143 12/21/2018   K 4.7 12/21/2018   CO2 31 12/21/2018   GLUCOSE 95 12/21/2018   BUN 11 12/21/2018   CREATININE 0.99 12/21/2018   BILITOT 0.9 12/21/2018   ALKPHOS 60 12/21/2018   AST 22 12/21/2018   ALT 24 12/21/2018   PROT 6.5 12/21/2018   ALBUMIN 4.3 12/21/2018   CALCIUM 10.0 12/21/2018   ANIONGAP 5 01/26/2018   GFR 76.34 12/21/2018   Lab Results  Component Value Date   CHOL 134 12/21/2018   Lab Results  Component Value Date   HDL 44.60 12/21/2018   Lab Results  Component Value Date   LDLCALC 66 12/21/2018   Lab Results  Component Value Date   TRIG 115.0 12/21/2018   Lab Results  Component Value Date   CHOLHDL 3 12/21/2018   No results found for: HGBA1C     Assessment & Plan:   Problem List Items Addressed This Visit      Active Problems   COPD (chronic obstructive pulmonary disease) ([place]) - Primary   Relevant Medications   azithromycin (ZITHROMAX) 250 MG tablet   predniSONE (DELTASONE) 10 MG tablet      I am having Octavio Manns. Sattar start on azithromycin and predniSONE. I am also having him maintain his aspirin, multivitamin, vitamin B-12, Trelegy Ellipta, ProAir RespiClick, atorvastatin, metoprolol tartrate, sertraline, and amoxicillin.  Meds ordered this encounter  Medications  . azithromycin (ZITHROMAX) 250 MG tablet    Sig: 2 tabs by mouth on day 1 followed 1 tab by mouth daily days 2-5    Dispense:  6 tablet    Refill:  0  . predniSONE (DELTASONE) 10 MG tablet    Sig: 3 tabs by mouth x 3 days, 2 tabs by mouth x 2 days, 1 tab by mouth x 2 days and stop.    Dispense:  15 tablet    Refill:   0     Arnette Norris, MD

## 2019-04-05 ENCOUNTER — Telehealth (INDEPENDENT_AMBULATORY_CARE_PROVIDER_SITE_OTHER): Payer: BC Managed Care – PPO | Admitting: Family Medicine

## 2019-04-05 VITALS — Temp 97.9°F

## 2019-04-05 DIAGNOSIS — J449 Chronic obstructive pulmonary disease, unspecified: Secondary | ICD-10-CM | POA: Diagnosis not present

## 2019-04-05 MED ORDER — AZITHROMYCIN 250 MG PO TABS: ORAL_TABLET | ORAL | 0 refills | Status: DC

## 2019-04-05 MED ORDER — PREDNISONE 10 MG PO TABS: ORAL_TABLET | ORAL | 0 refills | Status: DC

## 2019-04-05 NOTE — Assessment & Plan Note (Signed)
Known COPD and feels this feels exactly like his yearly exacerbation. No other symptoms.  Afebrile.  eRx sent for Zpack and prednisone burst. Reviewed supportive care and red flags that should prompt return. Call or send my chart message prn if these symptoms worsen or fail to improve as anticipated. The patient indicates understanding of these issues and agrees with the plan.

## 2019-04-05 NOTE — Patient Instructions (Signed)
Health Maintenance Due  Topic Date Due  . COLONOSCOPY  01/31/2019    Depression screen [facility] 2/9 12/21/2018 04/02/2017  Decreased Interest 0 0  Down, Depressed, Hopeless 0 0  PHQ - 2 Score 0 0

## 2019-04-07 ENCOUNTER — Telehealth: Payer: Self-pay | Admitting: Family Medicine

## 2019-04-07 NOTE — Telephone Encounter (Signed)
Pt came in office and dropeed off form for Dr Deborra Medina to fill out. I put in Dr Hulen Shouts folder up front. Pt would like a call back at [phone] when its done.

## 2019-04-15 ENCOUNTER — Other Ambulatory Visit: Payer: Self-pay | Admitting: Family Medicine

## 2019-04-19 ENCOUNTER — Telehealth: Payer: Self-pay | Admitting: Family Medicine

## 2019-04-22 NOTE — Telephone Encounter (Signed)
Patient calling about the status of his refill request. Please call patient

## 2019-04-23 MED ORDER — AZITHROMYCIN 250 MG PO TABS: ORAL_TABLET | ORAL | 0 refills | Status: DC

## 2019-04-23 NOTE — Telephone Encounter (Signed)
Per TA sent in refill of Z-Pak/LMOVM stating that request was approved/thx dmf

## 2019-04-23 NOTE — Telephone Encounter (Signed)
Okay to refill his zpack.  Thanks!

## 2019-04-23 NOTE — Telephone Encounter (Signed)
TA-Pt called stating that started to see improvement on the Z-pak but when finished started getting bad again/is requesting to be retreated/plz advise/thx dmf

## 2019-04-23 NOTE — Addendum Note (Signed)
Addended by: Marrion Coy on: 04/23/2019 11:05 AM   Modules accepted: Orders

## 2019-04-26 ENCOUNTER — Other Ambulatory Visit: Payer: Self-pay | Admitting: Family Medicine

## 2019-06-23 ENCOUNTER — Other Ambulatory Visit: Payer: Self-pay

## 2019-06-23 MED ORDER — ATORVASTATIN CALCIUM 40 MG PO TABS: ORAL_TABLET | ORAL | 1 refills | Status: DC

## 2019-06-25 ENCOUNTER — Other Ambulatory Visit: Payer: Self-pay

## 2019-06-25 DIAGNOSIS — I779 Disorder of arteries and arterioles, unspecified: Secondary | ICD-10-CM

## 2019-06-25 DIAGNOSIS — I2510 Atherosclerotic heart disease of native coronary artery without angina pectoris: Secondary | ICD-10-CM

## 2019-06-25 MED ORDER — METOPROLOL TARTRATE 50 MG PO TABS: ORAL_TABLET | ORAL | 0 refills | Status: DC

## 2019-07-09 ENCOUNTER — Ambulatory Visit: Payer: BC Managed Care – PPO | Attending: Internal Medicine

## 2019-07-09 DIAGNOSIS — Z23 Encounter for immunization: Secondary | ICD-10-CM

## 2019-07-09 NOTE — Progress Notes (Signed)
   Covid-19 Vaccination Clinic  Name:  Hayden Deleon    MRN: [id number] DOB: [birth year]  07/09/2019  Hayden Deleon was observed post Covid-19 immunization for 15 minutes without incident. He was provided with Vaccine Information Sheet and instruction to access the V-Safe system.   Hayden Deleon was instructed to call 911 with any severe reactions post vaccine: [name] Difficulty breathing  . Swelling of face and throat  . A fast heartbeat  . A bad rash all over body  . Dizziness and weakness   Immunizations Administered    Name Date Dose VIS Date Route   Pfizer COVID-19 Vaccine 07/09/2019  4:09 PM 0.3 mL 03/12/2019 Intramuscular   Manufacturer: [place]   Lot: [id number]   [place]: [id number]

## 2019-08-04 ENCOUNTER — Ambulatory Visit: Payer: BC Managed Care – PPO | Attending: Internal Medicine

## 2019-08-04 DIAGNOSIS — Z23 Encounter for immunization: Secondary | ICD-10-CM

## 2019-08-04 NOTE — Progress Notes (Signed)
   Covid-19 Vaccination Clinic  Name:  Hayden Deleon    MRN: [id number] DOB: 1955/11/17  08/04/2019  Mr. Hayden Deleon was observed post Covid-19 immunization for 15 minutes without incident. He was provided with Vaccine Information Sheet and instruction to access the V-Safe system.   Mr. Hayden Deleon was instructed to call 911 with any severe reactions post vaccine: [name] Difficulty breathing  . Swelling of face and throat  . A fast heartbeat  . A bad rash all over body  . Dizziness and weakness   Immunizations Administered    Name Date Dose VIS Date Route   Pfizer COVID-19 Vaccine 08/04/2019  8:11 AM 0.3 mL 05/26/2018 Intramuscular   Manufacturer: [place]   Lot: [id number]   [place]: [id number]

## 2019-09-01 ENCOUNTER — Other Ambulatory Visit: Payer: Self-pay

## 2019-09-01 ENCOUNTER — Encounter: Payer: Self-pay | Admitting: Internal Medicine

## 2019-09-01 ENCOUNTER — Ambulatory Visit (INDEPENDENT_AMBULATORY_CARE_PROVIDER_SITE_OTHER): Payer: BC Managed Care – PPO | Admitting: Internal Medicine

## 2019-09-01 VITALS — BP 122/64 | HR 61 | Temp 98.0°F | Ht 71.0 in | Wt 224.2 lb

## 2019-09-01 DIAGNOSIS — G4733 Obstructive sleep apnea (adult) (pediatric): Secondary | ICD-10-CM | POA: Diagnosis not present

## 2019-09-01 DIAGNOSIS — J449 Chronic obstructive pulmonary disease, unspecified: Secondary | ICD-10-CM

## 2019-09-01 MED ORDER — ALBUTEROL SULFATE HFA 108 (90 BASE) MCG/ACT IN AERS: 2.0000 | INHALATION_SPRAY | Freq: Four times a day (QID) | RESPIRATORY_TRACT | 10 refills | Status: DC | PRN

## 2019-09-01 MED ORDER — TRELEGY ELLIPTA 100-62.5-25 MCG/INH IN AEPB: 1.0000 | INHALATION_SPRAY | Freq: Every day | RESPIRATORY_TRACT | 2 refills | Status: DC

## 2019-09-01 NOTE — Patient Instructions (Addendum)
CONTINUE TRELEGY AS PRESCRIBED  VENTOLIN HFA AS NEEDED FOR WHEEZING   CONTINUE CPAP THERAPY AS PRESCRIBED   RECOMMEND WEIGHT LOSS GOAL 215 POUNDS

## 2019-09-01 NOTE — Progress Notes (Signed)
[facility] Consultation        Date: 09/01/2019  MRN# [id number] Hayden Deleon Jul 28, 1955    Hayden Deleon is a 64 y.o. old male seen in consultation for chief complaint of:    Chief Complaint  Patient presents with  . Follow-up    COPD, OSA    HPI:   64 year old ex-smoker presents for follow-up obstructive sleep apnea COPD  Regarding his COPD patient seems to have progressive shortness of breath and increased work of breathing I have explained to patient that he may need to be tested for exertional hypoxia At this time he deferred and we will reassess this in 6 months Proventil powder albuterol does not help patient would like Ventolin HFA Patient currently on triple therapy with Trelegy This has worked really well for him  No exacerbation at this time No evidence of heart failure at this time No evidence or signs of infection at this time No respiratory distress No fevers, chills, nausea, vomiting, diarrhea No evidence of lower extremity edema No evidence hemoptysis   Regarding sleep apnea Patient has excellent compliance report Patient uses CPAP 7 cm water pressure 90% compliance for greater than 4 hours 93% compliance for days there is no leak uses nasal pillows Previous AHI was 35 Report relayed to patient in detail Patient doing really well with controlling his sleep apnea  Current weight is 224 pounds goal weight next visit will be 215 pounds   **CPAP download 07/2019 Excellent compliance report CPAP set at 7 AHI down to 2.6 93% for greater than 4 hours 9% for days  **CPAP download 05/22/2018- 08/19/2018.>>  Raw data present reviewed.  Average usage on days used is 6 hours 40 minutes.  Usage greater than 4 hours is 77/90 days.  CPAP set at 7.  Leaks are within normal limits.  Residual AHI is 2.  Overall this shows good compliance with CPAP with excellent control obstructive sleep apnea. **Pneumovax administered 12/30/16 **PFT  12/24/16; Spirometry: FVC was 62% of predicted, FEV1 was 33% of predicted, ratio is 43%. Flow volume loop is consistent with obstruction. There is significant improvement with bronchodilator therapy. Lung volumes: TLC is 78% of predicted, RV to TLC ratio was normal. DLCO is 49% of predicted. -Overall this test is consistent with severe COPD with reversibility, there may also be an element of mild restrictive lung disease.  **CBC 08/22/16; Eos=600 during an episode of AECOPD. At baseline he is at 300.  **Alpha-1 09/30/16>> normal.  **CT chest 09/06/16 shows severe diffuse panlobular emphysema, with some left basilar scarring, left pleural thickening, small effusion with leftward mediastinal shift.. **Sleep study 05/30/12>>AHI 35  12/28/2012 PSG showed -AHI 28 times/h  He did not sleep much on CPAP - start CPAP 7cm , pillows, humidity,  Download 10/28/12 on 7 cm - AHI 5/h, good usage, no leak   Review of Systems:  Gen:  Denies  fever, sweats, chills weight loss  HEENT: Denies blurred vision, double vision, ear pain, eye pain, hearing loss, nose bleeds, sore throat Cardiac:  No dizziness, chest pain or heaviness, chest tightness,edema, No JVD Resp:   No cough, -sputum production, +shortness of breath,+wheezing, -hemoptysis,  Gi: Denies swallowing difficulty, stomach pain, nausea or vomiting, diarrhea, constipation, bowel incontinence Gu:  Denies bladder incontinence, burning urine Ext:   Denies Joint pain, stiffness or swelling Skin: Denies  skin rash, easy bruising or bleeding or hives Endoc:  Denies polyuria, polydipsia , polyphagia or weight change Psych:   Denies  depression, insomnia or hallucinations  Other:  All other systems negative   Physical Examination:   VS: BP 122/64 (BP Location: Left Arm, Cuff Size: Normal)   Pulse 61   Temp 98 F (36.7 C) (Oral)   Ht 5\' 11"  (1.803 m)   Wt 224 lb 3.2 oz (101.7 kg)   SpO2 92%   BMI 31.27 kg/m     Physical Examination:   General  Appearance: No distress  Neuro:without focal findings,  speech normal,  HEENT: PERRLA, EOM intact.   Pulmonary: normal breath sounds, No wheezing.  CardiovascularNormal S1,S2.  No m/r/g.   Abdomen: Benign, Soft, non-tender. Renal:  No costovertebral tenderness  GU:  Not performed at this time. Endoc: No evident thyromegaly Skin:   warm, no rashes, no ecchymosis  Extremities: normal, no cyanosis, clubbing. PSYCHIATRIC: Mood, affect within normal limits.   ALL OTHER ROS ARE NEGATIVE   Allergies:  Avelox [moxifloxacin hcl in nacl], Sulfamethoxazole-trimethoprim, and Aspirin   Assessment and Plan:  64 year old white male with severe COPD based on pulmonary function testing of FEV1 of 33% predicted with underlying severe sleep apnea in the setting of obesity and deconditioned state   COPD severe Gold stage D  Continue Trelegy therapy  Change to Ventolin HFA albuterol as needed  Avoid secondhand smoke  No indication for steroids or antibiotics at this time --Pneumovax administered 2016; can administer Prevnar when age 58.  --Alpha-1 normal.    Severe sleep apnea Patient has excellent compliance report at this time Patient uses and benefits from therapy Patient sleep apnea is well controlled  Obesity -recommend significant weight loss -recommend changing diet Patient currently weighs 224 pounds Weight goal in 6 months is 215 pounds  Deconditioned state -Recommend increased daily activity and exercise   COVID-19 EDUCATION: The signs and symptoms of COVID-19 were discussed with the patient and how to seek care for testing (follow up with PCP or arrange E-visit).  The importance of social distancing was discussed today.  MEDICATION ADJUSTMENTS/LABS AND TESTS ORDERED: Continue Trelegy Ventolin HFA as needed Recommend weight loss Continue CPAP therapy CURRENT MEDICATIONS REVIEWED AT LENGTH WITH PATIENT TODAY   Patientsatisfied with Plan of action and management. All  questions answered  Follow up 6 months  Time spent 31 minutes   Corrin Parker, M.D.  [facility]  Medical Director [place] Director [facility] Cardio-Pulmonary Department

## 2019-09-27 ENCOUNTER — Telehealth: Payer: Self-pay | Admitting: Orthopedic Surgery

## 2019-09-27 NOTE — Telephone Encounter (Signed)
Please advise. Thanks.  

## 2019-09-27 NOTE — Telephone Encounter (Signed)
Ok for amox 2 gr po 1 hour before thx

## 2019-09-27 NOTE — Telephone Encounter (Signed)
Patient called advised he has a dental appointment Wednesday and will need an antibiotic called into [facility] in [place]. The number to contact patient is [phone]

## 2019-09-28 ENCOUNTER — Other Ambulatory Visit: Payer: Self-pay | Admitting: Family Medicine

## 2019-09-28 DIAGNOSIS — I2510 Atherosclerotic heart disease of native coronary artery without angina pectoris: Secondary | ICD-10-CM

## 2019-09-28 DIAGNOSIS — I779 Disorder of arteries and arterioles, unspecified: Secondary | ICD-10-CM

## 2019-09-28 MED ORDER — AMOXICILLIN 500 MG PO TABS: ORAL_TABLET | ORAL | 0 refills | Status: DC

## 2019-09-28 NOTE — Telephone Encounter (Signed)
Submitted to pharmacy. Patient advised.

## 2019-09-28 NOTE — Telephone Encounter (Signed)
Last VV 04/05/19 Last fil 06/25/19  #180/0 Pt need to schedule an office visit with New Provider.

## 2019-09-30 ENCOUNTER — Telehealth: Payer: Self-pay | Admitting: Family Medicine

## 2019-09-30 NOTE — Telephone Encounter (Signed)
Former Dr. Deborra Medina patient: Left message for patient to give the office a call back if interested in establishing care with one of our providers here at our office.

## 2019-11-09 ENCOUNTER — Encounter: Payer: Self-pay | Admitting: Gastroenterology

## 2019-12-01 ENCOUNTER — Ambulatory Visit: Payer: Self-pay

## 2019-12-01 ENCOUNTER — Ambulatory Visit (INDEPENDENT_AMBULATORY_CARE_PROVIDER_SITE_OTHER): Payer: BC Managed Care – PPO | Admitting: Orthopedic Surgery

## 2019-12-01 DIAGNOSIS — M542 Cervicalgia: Secondary | ICD-10-CM

## 2019-12-01 DIAGNOSIS — M79602 Pain in left arm: Secondary | ICD-10-CM | POA: Diagnosis not present

## 2019-12-01 DIAGNOSIS — M792 Neuralgia and neuritis, unspecified: Secondary | ICD-10-CM | POA: Diagnosis not present

## 2019-12-01 MED ORDER — METHOCARBAMOL 500 MG PO TABS
500.0000 mg | ORAL_TABLET | Freq: Three times a day (TID) | ORAL | 0 refills | Status: DC | PRN
Start: 2019-12-01 — End: 2019-12-10

## 2019-12-01 MED ORDER — PREDNISONE 5 MG (21) PO TBPK
ORAL_TABLET | ORAL | 0 refills | Status: DC
Start: 2019-12-01 — End: 2019-12-13

## 2019-12-06 ENCOUNTER — Encounter: Payer: Self-pay | Admitting: Orthopedic Surgery

## 2019-12-06 NOTE — Progress Notes (Signed)
Office Visit Note   Patient: Abdurahman Rugg           Date of Birth: [birth year]           MRN: [id number] Visit Date: 12/01/2019 Requested by: No referring provider defined for this encounter. PCP: No primary care provider on file.  Subjective: Chief Complaint  Patient presents with  . Left Shoulder - Pain  . Arm Pain    HPI: Cecilia Nishikawa is a 64 y.o. male who presents to the office complaining of left shoulder and neck pain.  Patient notes pain that has been ongoing for 3 weeks.  He denies any acute injury or previous issue with his left shoulder/neck.  Denies any history of neck/shoulder surgery.  He reports acute onset.  Describes left-sided neck pain with radiation down the entire arm and numbness in the fourth and fifth fingers.  He also has numbness along the ulnar side of the forearm.  Pain wakes him up at night.  He states that placing an ice pack on his neck helps with the pain when he is trying to sleep.  He has subjective weakness, particularly with trying to grip a soda can..  Pain is worse with coughing.  He reports pain in the medial aspect of the left shoulder blade.  No change in symptoms with the arm raised overhead.  He works for a Teacher, adult education.  He has a history of a right total shoulder arthroplasty.  Denies any history of diabetes, smoking.              ROS: All systems reviewed are negative as they relate to the chief complaint within the history of present illness.  Patient denies fevers or chills.  Assessment & Plan: Visit Diagnoses:  1. Radicular pain in left arm   2. Left arm pain   3. Cervicalgia     Plan: Patient is a 64 year old male presents complaining of left shoulder neck pain.  He has had acute onset of pain for the last 3 weeks.  He has radicular pain that travels down the entirety of the left arm into his fourth and fifth fingers and caused him to go numb.  He has mild weakness with grip strength.  He has a positive Spurling sign.  Degenerative  changes are present of the left glenohumeral joint as well as the cervical spine.  Impression is that despite the presence of shoulder arthritis, the majority of his pain is stemming from the cervical spine.  Ordered MRI of the cervical spine for further evaluation.  Prescribed a steroid Dosepak and Robaxin for symptomatic relief in the meantime.  Follow-up after MRI to review results.  Follow-Up Instructions: No follow-ups on file.   Orders:  Orders Placed This Encounter  Procedures  . XR Shoulder Left  . XR Cervical Spine 2 or 3 views  . MR Cervical Spine w/o contrast   Meds ordered this encounter  Medications  . methocarbamol (ROBAXIN) 500 MG tablet    Sig: Take 1 tablet (500 mg total) by mouth every 8 (eight) hours as needed for muscle spasms.    Dispense:  30 tablet    Refill:  0  . predniSONE (STERAPRED UNI-PAK 21 TAB) 5 MG (21) TBPK tablet    Sig: Take dosepak as directed    Dispense:  21 tablet    Refill:  0      Procedures: No procedures performed   Clinical Data: No additional findings.  Objective: Vital Signs: There were  no vitals taken for this visit.  Physical Exam:  Constitutional: Patient appears well-developed HEENT:  Head: Normocephalic Eyes:EOM are normal Neck: Normal range of motion Cardiovascular: Normal rate Pulmonary/chest: Effort normal Neurologic: Patient is alert Skin: Skin is warm Psychiatric: Patient has normal mood and affect  Ortho Exam: Ortho exam demonstrates left shoulder with 40 degrees external rotation, 95 degrees abduction, 170 degrees forward flexion.  Excellent strength of the supraspinatus, infraspinatus, subscapularis.  No significant crepitus felt when passively moving the left shoulder.  This is present throughout the axial cervical spine.  Tenderness over the left-sided paraspinal musculature.  Positive Spurling sign.  5/5 motor strength of the bilateral finger abduction, pronation/supination, bicep, tricep, deltoid.  Mild  weakness with grip strength compared with contralateral side.  Pain with cervical spine active range of motion, particularly when looking to his left.  Specialty Comments:  No specialty comments available.  Imaging: No results found.   PMFS History: Patient Active Problem List   Diagnosis Date Noted  . Coronary artery disease of native artery of native heart with stable angina pectoris ([place]) 12/21/2018  . Shoulder arthritis 02/03/2018  . Gout 01/21/2018  . Low testosterone 01/21/2018  . BPV (benign positional vertigo), left 07/10/2017  . Vitamin B12 deficiency 04/02/2017  . COPD (chronic obstructive pulmonary disease) ([place]) 10/07/2014  . Inguinal hernia of right side without obstruction or gangrene 07/31/2012  . OSA (obstructive sleep apnea) 05/01/2012  . CAD (coronary artery disease)   . Carotid artery disease ([place])   . GERD (gastroesophageal reflux disease)   . Ejection fraction   . MIXED HYPERLIPIDEMIA 05/03/2010  . BENIGN POSITIONAL VERTIGO 05/03/2010  . Anxiety state 11/10/2008  . ANGIOEDEMA 08/18/2007  . SYSTOLIC MURMUR 41/96/2229  . DYSPHAGIA UNSPECIFIED 05/22/2007  . PROSTATITIS, HX OF 05/22/2007  . ADVEF, DRUG/MEDICINAL/BIOLOGICAL SUBST NOS 01/26/2007  . COLONIC POLYPS 12/31/2003  . GASTROESOPHAGEAL REFLUX DISEASE, CHRONIC 12/31/2003  . HIATAL HERNIA 12/31/2003  . DIVERTICULOSIS, COLON 12/31/2003  . HLD (hyperlipidemia) 05/02/2001  . HTN (hypertension) 05/02/2001   Past Medical History:  Diagnosis Date  . Allergy   . Anxiety   . Arthritis    right shoulder  . CAD (coronary artery disease)    Catheterization, 2003, occluded OM1, good collaterals  //   nuclear, August, 2009, low risk, EF 60%  . Carotid artery disease ([place])    Doppler, February, 2012, 7-98% R. ICA, 92-11% LICA,  plan followup 1 year  . COPD (chronic obstructive pulmonary disease) (HCC)    Significant Dr. Lamonte Sakai  . Diverticulosis of colon   . Ejection fraction    EF 60%, nuclear, 2009  .  GERD (gastroesophageal reflux disease)   . Gout   . Hyperlipidemia   . Hypertension   . Prostatitis   . Sleep apnea    uses cpap    Family History  Problem Relation Age of Onset  . Angina Mother        car accident  . Asthma Father   . Crohn's disease Sister   . Colon cancer Neg Hx   . Esophageal cancer Neg Hx   . Rectal cancer Neg Hx   . Stomach cancer Neg Hx     Past Surgical History:  Procedure Laterality Date  . COLONOSCOPY  12/2003   patterson - polyp  . deviated septum    . KNEE ARTHROSCOPY     right  . TOTAL SHOULDER ARTHROPLASTY Right 02/03/2018   Procedure: RIGHT REVERSE SHOULDER ARTHROPLASTY;  Surgeon: Meredith Pel, MD;  Location: [facility];  Service: Orthopedics;  Laterality: Right;  . WISDOM TOOTH EXTRACTION     Social History   Occupational History  . Occupation: Freight forwarder at [employer]    Employer: [employer]  Tobacco Use  . Smoking status: Former Smoker    Packs/day: 2.00    Years: 32.00    Pack years: 64.00    Types: Cigarettes    Quit date: 08/31/1999    Years since quitting: 20.2  . Smokeless tobacco: Former Network engineer and Sexual Activity  . Alcohol use: No    Alcohol/week: 0.0 standard drinks  . Drug use: No  . Sexual activity: Not on file

## 2019-12-10 ENCOUNTER — Other Ambulatory Visit: Payer: Self-pay | Admitting: Surgical

## 2019-12-11 ENCOUNTER — Encounter: Payer: Self-pay | Admitting: Orthopedic Surgery

## 2019-12-13 ENCOUNTER — Encounter: Payer: Self-pay | Admitting: Family Medicine

## 2019-12-13 ENCOUNTER — Ambulatory Visit (INDEPENDENT_AMBULATORY_CARE_PROVIDER_SITE_OTHER): Payer: BC Managed Care – PPO | Admitting: Family Medicine

## 2019-12-13 ENCOUNTER — Other Ambulatory Visit: Payer: Self-pay

## 2019-12-13 VITALS — BP 108/68 | HR 52 | Temp 98.2°F | Ht 71.0 in | Wt 221.2 lb

## 2019-12-13 DIAGNOSIS — Z8739 Personal history of other diseases of the musculoskeletal system and connective tissue: Secondary | ICD-10-CM

## 2019-12-13 DIAGNOSIS — E782 Mixed hyperlipidemia: Secondary | ICD-10-CM

## 2019-12-13 DIAGNOSIS — Z0000 Encounter for general adult medical examination without abnormal findings: Secondary | ICD-10-CM | POA: Insufficient documentation

## 2019-12-13 DIAGNOSIS — E538 Deficiency of other specified B group vitamins: Secondary | ICD-10-CM

## 2019-12-13 DIAGNOSIS — J449 Chronic obstructive pulmonary disease, unspecified: Secondary | ICD-10-CM

## 2019-12-13 DIAGNOSIS — I10 Essential (primary) hypertension: Secondary | ICD-10-CM | POA: Diagnosis not present

## 2019-12-13 NOTE — Progress Notes (Signed)
New Patient Office Visit  Subjective:  Patient ID: Hayden Deleon, male    DOB: 30-Jun-1955  Age: 64 y.o. MRN: [id number]  CC:  Chief Complaint  Patient presents with  . Transitions Of Care    TOC from Dr. Deborra Medina, no concerns.     HPI Hayden Deleon presents for follow-up of hypertension, elevated cholesterol, history of COPD.  He does have a history of anxiety that is been well treated over the years with Zoloft.  Medication works for him.  History of hypertension and coronary artery disease well controlled with metoprolol and atorvastatin.  Denies chest pain.  He quit smoking many years ago.  He sees urology for yearly checks on his prostate.  Urine flow has been excellent without hesitancy nocturia pre or post dribbling.  He is married.  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  Currently seeing orthopedics for cervical neuropathy.  An MRI is planned in the near future.  Past Medical History:  Diagnosis Date  . Allergy   . Anxiety   . Arthritis    right shoulder  . CAD (coronary artery disease)    Catheterization, 2003, occluded OM1, good collaterals  //   nuclear, August, 2009, low risk, EF 60%  . Carotid artery disease ([place])    Doppler, February, 2012, 7-82% R. ICA, 95-62% LICA,  plan followup 1 year  . COPD (chronic obstructive pulmonary disease) ([facility])    Significant Dr. Lamonte Sakai  . Diverticulosis of colon   . Ejection fraction    EF 60%, nuclear, 2009  . GERD (gastroesophageal reflux disease)   . Gout   . Hyperlipidemia   . Hypertension   . Prostatitis   . Sleep apnea    uses cpap    Past Surgical History:  Procedure Laterality Date  . COLONOSCOPY  12/2003   patterson - polyp  . deviated septum    . KNEE ARTHROSCOPY     right  . TOTAL SHOULDER ARTHROPLASTY Right 02/03/2018   Procedure: RIGHT REVERSE SHOULDER ARTHROPLASTY;  Surgeon: Meredith Pel, MD;  Location: [facility];  Service: Orthopedics;  Laterality: Right;  . WISDOM TOOTH  EXTRACTION      Family History  Problem Relation Age of Onset  . Angina Mother        car accident  . Asthma Father   . Crohn's disease Sister   . Colon cancer Neg Hx   . Esophageal cancer Neg Hx   . Rectal cancer Neg Hx   . Stomach cancer Neg Hx     Social History   Socioeconomic History  . Marital status: Married    Spouse name: Not on file  . Number of children: 0  . Years of education: Not on file  . Highest education level: Not on file  Occupational History  . Occupation: Freight forwarder at [employer]    Employer: [employer]  Tobacco Use  . Smoking status: Former Smoker    Packs/day: 2.00    Years: 32.00    Pack years: 64.00    Types: Cigarettes    Quit date: 08/31/1999    Years since quitting: 20.2  . Smokeless tobacco: Former Network engineer  . Vaping Use: Never used  Substance and Sexual Activity  . Alcohol use: No    Alcohol/week: 0.0 standard drinks  . Drug use: No  . Sexual activity: Not on file  Other Topics Concern  . Not on file  Social  History Narrative  . Not on file   Social Determinants of Health   Financial Resource Strain:   . Difficulty of Paying Living Expenses: Not on file  Food Insecurity:   . Worried About Charity fundraiser in the Last Year: Not on file  . Ran Out of Food in the Last Year: Not on file  Transportation Needs:   . Lack of Transportation (Medical): Not on file  . Lack of Transportation (Non-Medical): Not on file  Physical Activity:   . Days of Exercise per Week: Not on file  . Minutes of Exercise per Session: Not on file  Stress:   . Feeling of Stress : Not on file  Social Connections:   . Frequency of Communication with Friends and Family: Not on file  . Frequency of Social Gatherings with Friends and Family: Not on file  . Attends Religious Services: Not on file  . Active Member of Clubs or Organizations: Not on file  . Attends Archivist Meetings: Not on file  . Marital Status: Not on  file  Intimate Partner Violence:   . Fear of Current or Ex-Partner: Not on file  . Emotionally Abused: Not on file  . Physically Abused: Not on file  . Sexually Abused: Not on file    ROS Review of Systems  Constitutional: Negative.   HENT: Negative.   Eyes: Negative for photophobia and visual disturbance.  Respiratory: Negative.   Cardiovascular: Negative.   Gastrointestinal: Negative.   Endocrine: Negative for polyphagia and polyuria.  Genitourinary: Negative for difficulty urinating, frequency and urgency.  Musculoskeletal: Positive for neck pain. Negative for neck stiffness.  Allergic/Immunologic: Negative for immunocompromised state.  Neurological: Negative.  Negative for weakness.  Hematological: Does not bruise/bleed easily.  Psychiatric/Behavioral: Negative.     Objective:   Today's Vitals: BP 108/68   Pulse (!) 52   Temp 98.2 F (36.8 C) (Tympanic)   Ht 5\' 11"  (1.803 m)   Wt 221 lb 3.2 oz (100.3 kg)   SpO2 93%   BMI 30.85 kg/m   Physical Exam Vitals and nursing note reviewed.  Constitutional:      General: He is not in acute distress.    Appearance: Normal appearance. He is obese. He is not ill-appearing, toxic-appearing or diaphoretic.  HENT:     Head: Normocephalic and atraumatic.     Right Ear: Tympanic membrane, ear canal and external ear normal.     Left Ear: Tympanic membrane, ear canal and external ear normal.  Eyes:     General:        Right eye: No discharge.        Left eye: No discharge.     Extraocular Movements: Extraocular movements intact.     Conjunctiva/sclera: Conjunctivae normal.     Pupils: Pupils are equal, round, and reactive to light.  Cardiovascular:     Rate and Rhythm: Normal rate and regular rhythm.  Pulmonary:     Effort: Pulmonary effort is normal.     Breath sounds: Normal breath sounds.  Abdominal:     General: Bowel sounds are normal. There is no distension.     Palpations: Abdomen is soft. There is no mass.      Tenderness: There is no abdominal tenderness. There is no guarding or rebound.     Hernia: No hernia is present.  Musculoskeletal:     Cervical back: No rigidity or tenderness.     Right lower leg: No edema.  Left lower leg: No edema.  Lymphadenopathy:     Cervical: No cervical adenopathy.  Skin:    General: Skin is warm and dry.  Neurological:     General: No focal deficit present.     Mental Status: He is alert and oriented to person, place, and time.  Psychiatric:        Mood and Affect: Mood normal.        Behavior: Behavior normal.     Assessment & Plan:   Problem List Items Addressed This Visit      Cardiovascular and Mediastinum   HTN (hypertension)   Relevant Orders   CBC   Comprehensive metabolic panel   Urinalysis, Routine w reflex microscopic     Respiratory   COPD (chronic obstructive pulmonary disease) (HCC) - Primary     Other   MIXED HYPERLIPIDEMIA   Relevant Orders   Lipid panel   B12 deficiency   Relevant Orders   Vitamin B12   History of gout   Relevant Orders   Uric acid   Healthcare maintenance   Relevant Orders   PSA   TSH      Outpatient Encounter Medications as of 12/13/2019  Medication Sig  . albuterol (VENTOLIN HFA) 108 (90 Base) MCG/ACT inhaler Inhale 2 puffs into the lungs every 6 (six) hours as needed for wheezing or shortness of breath.  [name] aspirin 81 MG tablet Take 81 mg by mouth daily.    [name] atorvastatin (LIPITOR) 40 MG tablet Take 1qd (Please sched appt with new provider)  . Fluticasone-Umeclidin-Vilant (TRELEGY ELLIPTA) 100-62.5-25 MCG/INH AEPB Inhale 1 puff into the lungs daily.  . methocarbamol (ROBAXIN) 500 MG tablet TAKE 1 TABLET (500 MG TOTAL) BY MOUTH EVERY 8 (EIGHT) HOURS AS NEEDED FOR MUSCLE SPASMS.  . metoprolol tartrate (LOPRESSOR) 50 MG tablet Take 1bid (Plz sched appt with new provider)  . Multiple Vitamin (MULTIVITAMIN) capsule Take 1 capsule by mouth daily.    . sertraline (ZOLOFT) 50 MG tablet Take 1 tablet (50  mg total) by mouth daily.  . [DISCONTINUED] Albuterol Sulfate (PROAIR RESPICLICK) 626 (90 Base) MCG/ACT AEPB Inhale 2 puffs into the lungs every 6 (six) hours as needed. (Patient not taking: Reported on 12/13/2019)  . [DISCONTINUED] amoxicillin (AMOXIL) 500 MG tablet 2g po 1 hr prior to dental procedure (Patient not taking: Reported on 12/13/2019)  . [DISCONTINUED] predniSONE (DELTASONE) 10 MG tablet 3 TABS BY MOUTH X 3 DAYS, 2 TABS BY MOUTH X 2 DAYS, 1 TAB BY MOUTH X 2 DAYS AND STOP. (Patient not taking: Reported on 12/13/2019)  . [DISCONTINUED] predniSONE (STERAPRED UNI-PAK 21 TAB) 5 MG (21) TBPK tablet Take dosepak as directed (Patient not taking: Reported on 12/13/2019)  . [DISCONTINUED] vitamin B-12 (CYANOCOBALAMIN) 1000 MCG tablet Take 1,000 mcg by mouth daily. (Patient not taking: Reported on 12/13/2019)   No facility-administered encounter medications on file as of 12/13/2019.    Follow-up: Return in about 1 year (around 12/12/2020).  Given information on health maintenance and disease prevention.  Encouraged him to try to lose weight perhaps by using weight watchers.  He will return fasting for above ordered blood work.  Libby Maw, MD

## 2019-12-13 NOTE — Patient Instructions (Signed)
Health Maintenance, Male Adopting a healthy lifestyle and getting preventive care are important in promoting health and wellness. Ask your health care provider about:  The right schedule for you to have regular tests and exams.  Things you can do on your own to prevent diseases and keep yourself healthy. What should I know about diet, weight, and exercise? Eat a healthy diet   Eat a diet that includes plenty of vegetables, fruits, low-fat dairy products, and lean protein.  Do not eat a lot of foods that are high in solid fats, added sugars, or sodium. Maintain a healthy weight Body mass index (BMI) is a measurement that can be used to identify possible weight problems. It estimates body fat based on height and weight. Your health care provider can help determine your BMI and help you achieve or maintain a healthy weight. Get regular exercise Get regular exercise. This is one of the most important things you can do for your health. Most adults should:  Exercise for at least 150 minutes each week. The exercise should increase your heart rate and make you sweat (moderate-intensity exercise).  Do strengthening exercises at least twice a week. This is in addition to the moderate-intensity exercise.  Spend less time sitting. Even light physical activity can be beneficial. Watch cholesterol and blood lipids Have your blood tested for lipids and cholesterol at 64 years of age, then have this test every 5 years. You may need to have your cholesterol levels checked more often if:  Your lipid or cholesterol levels are high.  You are older than 64 years of age.  You are at high risk for heart disease. What should I know about cancer screening? Many types of cancers can be detected early and may often be prevented. Depending on your health history and family history, you may need to have cancer screening at various ages. This may include screening for:  Colorectal cancer.  Prostate  cancer.  Skin cancer.  Lung cancer. What should I know about heart disease, diabetes, and high blood pressure? Blood pressure and heart disease  High blood pressure causes heart disease and increases the risk of stroke. This is more likely to develop in people who have high blood pressure readings, are of African descent, or are overweight.  Talk with your health care provider about your target blood pressure readings.  Have your blood pressure checked: ? Every 3-5 years if you are 18-39 years of age. ? Every year if you are 40 years old or older.  If you are between the ages of 65 and 75 and are a current or former smoker, ask your health care provider if you should have a one-time screening for abdominal aortic aneurysm (AAA). Diabetes Have regular diabetes screenings. This checks your fasting blood sugar level. Have the screening done:  Once every three years after age 45 if you are at a normal weight and have a low risk for diabetes.  More often and at a younger age if you are overweight or have a high risk for diabetes. What should I know about preventing infection? Hepatitis B If you have a higher risk for hepatitis B, you should be screened for this virus. Talk with your health care provider to find out if you are at risk for hepatitis B infection. Hepatitis C Blood testing is recommended for:  Everyone born from 1945 through 1965.  Anyone with known risk factors for hepatitis C. Sexually transmitted infections (STIs)  You should be screened each year   for STIs, including gonorrhea and chlamydia, if: ? You are sexually active and are younger than 64 years of age. ? You are older than 64 years of age and your health care provider tells you that you are at risk for this type of infection. ? Your sexual activity has changed since you were last screened, and you are at increased risk for chlamydia or gonorrhea. Ask your health care provider if you are at risk.  Ask your  health care provider about whether you are at high risk for HIV. Your health care provider may recommend a prescription medicine to help prevent HIV infection. If you choose to take medicine to prevent HIV, you should first get tested for HIV. You should then be tested every 3 months for as long as you are taking the medicine. Follow these instructions at home: Lifestyle  Do not use any products that contain nicotine or tobacco, such as cigarettes, e-cigarettes, and chewing tobacco. If you need help quitting, ask your health care provider.  Do not use street drugs.  Do not share needles.  Ask your health care provider for help if you need support or information about quitting drugs. Alcohol use  Do not drink alcohol if your health care provider tells you not to drink.  If you drink alcohol: ? Limit how much you have to 0-2 drinks a day. ? Be aware of how much alcohol is in your drink. In the [place] one drink equals one 12 oz bottle of beer (355 mL), one 5 oz glass of wine (148 mL), or one 1 oz glass of hard liquor (44 mL). General instructions  Schedule regular health, dental, and eye exams.  Stay current with your vaccines.  Tell your health care provider if: ? You often feel depressed. ? You have ever been abused or do not feel safe at home. Summary  Adopting a healthy lifestyle and getting preventive care are important in promoting health and wellness.  Follow your health care provider's instructions about healthy diet, exercising, and getting tested or screened for diseases.  Follow your health care provider's instructions on monitoring your cholesterol and blood pressure. This information is not intended to replace advice given to you by your health care provider. Make sure you discuss any questions you have with your health care provider. Document Revised: 03/11/2018 Document Reviewed: 03/11/2018 Elsevier Patient Education  2020 Elsevier Inc.  Preventive Care 40-64 Years  Old, Male Preventive care refers to lifestyle choices and visits with your health care provider that can promote health and wellness. This includes:  A yearly physical exam. This is also called an annual well check.  Regular dental and eye exams.  Immunizations.  Screening for certain conditions.  Healthy lifestyle choices, such as eating a healthy diet, getting regular exercise, not using drugs or products that contain nicotine and tobacco, and limiting alcohol use. What can I expect for my preventive care visit? Physical exam Your health care provider will check:  Height and weight. These may be used to calculate body mass index (BMI), which is a measurement that tells if you are at a healthy weight.  Heart rate and blood pressure.  Your skin for abnormal spots. Counseling Your health care provider may ask you questions about:  Alcohol, tobacco, and drug use.  Emotional well-being.  Home and relationship well-being.  Sexual activity.  Eating habits.  Work and work environment. What immunizations do I need?  Influenza (flu) vaccine  This is recommended every year. Tetanus, diphtheria,   and pertussis (Tdap) vaccine  You may need a Td booster every 10 years. Varicella (chickenpox) vaccine  You may need this vaccine if you have not already been vaccinated. Zoster (shingles) vaccine  You may need this after age 63. Measles, mumps, and rubella (MMR) vaccine  You may need at least one dose of MMR if you were born in 1957 or later. You may also need a second dose. Pneumococcal conjugate (PCV13) vaccine  You may need this if you have certain conditions and were not previously vaccinated. Pneumococcal polysaccharide (PPSV23) vaccine  You may need one or two doses if you smoke cigarettes or if you have certain conditions. Meningococcal conjugate (MenACWY) vaccine  You may need this if you have certain conditions. Hepatitis A vaccine  You may need this if you have  certain conditions or if you travel or work in places where you may be exposed to hepatitis A. Hepatitis B vaccine  You may need this if you have certain conditions or if you travel or work in places where you may be exposed to hepatitis B. Haemophilus influenzae type b (Hib) vaccine  You may need this if you have certain risk factors. Human papillomavirus (HPV) vaccine  If recommended by your health care provider, you may need three doses over 6 months. You may receive vaccines as individual doses or as more than one vaccine together in one shot (combination vaccines). Talk with your health care provider about the risks and benefits of combination vaccines. What tests do I need? Blood tests  Lipid and cholesterol levels. These may be checked every 5 years, or more frequently if you are over 68 years old.  Hepatitis C test.  Hepatitis B test. Screening  Lung cancer screening. You may have this screening every year starting at age 78 if you have a 30-pack-year history of smoking and currently smoke or have quit within the past 15 years.  Prostate cancer screening. Recommendations will vary depending on your family history and other risks.  Colorectal cancer screening. All adults should have this screening starting at age 38 and continuing until age 22. Your health care provider may recommend screening at age 73 if you are at increased risk. You will have tests every 1-10 years, depending on your results and the type of screening test.  Diabetes screening. This is done by checking your blood sugar (glucose) after you have not eaten for a while (fasting). You may have this done every 1-3 years.  Sexually transmitted disease (STD) testing. Follow these instructions at home: Eating and drinking  Eat a diet that includes fresh fruits and vegetables, whole grains, lean protein, and low-fat dairy products.  Take vitamin and mineral supplements as recommended by your health care  provider.  Do not drink alcohol if your health care provider tells you not to drink.  If you drink alcohol: ? Limit how much you have to 0-2 drinks a day. ? Be aware of how much alcohol is in your drink. In the [place] one drink equals one 12 oz bottle of beer (355 mL), one 5 oz glass of wine (148 mL), or one 1 oz glass of hard liquor (44 mL). Lifestyle  Take daily care of your teeth and gums.  Stay active. Exercise for at least 30 minutes on 5 or more days each week.  Do not use any products that contain nicotine or tobacco, such as cigarettes, e-cigarettes, and chewing tobacco. If you need help quitting, ask your health care provider.  If  you are sexually active, practice safe sex. Use a condom or other form of protection to prevent STIs (sexually transmitted infections).  Talk with your health care provider about taking a low-dose aspirin every day starting at age 50. What's next?  Go to your health care provider once a year for a well check visit.  Ask your health care provider how often you should have your eyes and teeth checked.  Stay up to date on all vaccines. This information is not intended to replace advice given to you by your health care provider. Make sure you discuss any questions you have with your health care provider. Document Revised: 03/12/2018 Document Reviewed: 03/12/2018 Elsevier Patient Education  2020 Elsevier Inc.  

## 2019-12-15 ENCOUNTER — Other Ambulatory Visit: Payer: Self-pay

## 2019-12-16 ENCOUNTER — Other Ambulatory Visit (INDEPENDENT_AMBULATORY_CARE_PROVIDER_SITE_OTHER): Payer: BC Managed Care – PPO

## 2019-12-16 DIAGNOSIS — Z8739 Personal history of other diseases of the musculoskeletal system and connective tissue: Secondary | ICD-10-CM | POA: Diagnosis not present

## 2019-12-16 DIAGNOSIS — E538 Deficiency of other specified B group vitamins: Secondary | ICD-10-CM

## 2019-12-16 DIAGNOSIS — Z0000 Encounter for general adult medical examination without abnormal findings: Secondary | ICD-10-CM

## 2019-12-16 DIAGNOSIS — E782 Mixed hyperlipidemia: Secondary | ICD-10-CM

## 2019-12-16 DIAGNOSIS — I10 Essential (primary) hypertension: Secondary | ICD-10-CM | POA: Diagnosis not present

## 2019-12-16 LAB — LIPID PANEL
Cholesterol: 136 mg/dL (ref 0–200)
HDL: 45.8 mg/dL (ref >39.00)
LDL Cholesterol: 65 mg/dL (ref 0–99)
NonHDL: 90.26
Total CHOL/HDL Ratio: 3
Triglycerides: 127 mg/dL (ref 0.0–149.0)
VLDL: 25.4 mg/dL (ref 0.0–40.0)

## 2019-12-16 LAB — CBC
HCT: 41.7 % (ref 39.0–52.0)
Hemoglobin: 14.3 g/dL (ref 13.0–17.0)
MCHC: 34.3 g/dL (ref 30.0–36.0)
MCV: 96.8 fl (ref 78.0–100.0)
Platelets: 172 10*3/uL (ref 150.0–400.0)
RBC: 4.31 Mil/uL (ref 4.22–5.81)
RDW: 13.4 % (ref 11.5–15.5)
WBC: 7.2 10*3/uL (ref 4.0–10.5)

## 2019-12-16 LAB — URINALYSIS, ROUTINE W REFLEX MICROSCOPIC
Bilirubin Urine: NEGATIVE
Ketones, ur: NEGATIVE
Leukocytes,Ua: NEGATIVE
Nitrite: NEGATIVE
Specific Gravity, Urine: >=1.03 — AB (ref 1.000–1.030)
Total Protein, Urine: NEGATIVE
Urine Glucose: NEGATIVE
Urobilinogen, UA: 0.2 (ref 0.0–1.0)
pH: 5 (ref 5.0–8.0)

## 2019-12-16 LAB — COMPREHENSIVE METABOLIC PANEL
ALT: 12 U/L (ref 0–53)
AST: 11 U/L (ref 0–37)
Albumin: 3.8 g/dL (ref 3.5–5.2)
Alkaline Phosphatase: 47 U/L (ref 39–117)
BUN: 9 mg/dL (ref 6–23)
CO2: 30 mEq/L (ref 19–32)
Calcium: 9 mg/dL (ref 8.4–10.5)
Chloride: 107 mEq/L (ref 96–112)
Creatinine, Ser: 0.94 mg/dL (ref 0.40–1.50)
GFR: 80.79 mL/min (ref >60.00)
Glucose, Bld: 85 mg/dL (ref 70–99)
Potassium: 4.3 mEq/L (ref 3.5–5.1)
Sodium: 143 mEq/L (ref 135–145)
Total Bilirubin: 0.6 mg/dL (ref 0.2–1.2)
Total Protein: 6 g/dL (ref 6.0–8.3)

## 2019-12-16 LAB — VITAMIN B12: Vitamin B-12: 261 pg/mL (ref 211–911)

## 2019-12-16 LAB — PSA: PSA: 1.56 ng/mL (ref 0.10–4.00)

## 2019-12-16 LAB — TSH: TSH: 1.36 u[IU]/mL (ref 0.35–4.50)

## 2019-12-16 LAB — URIC ACID: Uric Acid, Serum: 5.8 mg/dL (ref 4.0–7.8)

## 2019-12-17 ENCOUNTER — Telehealth: Payer: Self-pay | Admitting: Orthopedic Surgery

## 2019-12-17 NOTE — Telephone Encounter (Signed)
Called patient and left VM to call to set MRI review appt with Dr. Marlou Sa. Will try again later.

## 2019-12-23 ENCOUNTER — Ambulatory Visit (AMBULATORY_SURGERY_CENTER): Payer: Self-pay | Admitting: *Deleted

## 2019-12-23 ENCOUNTER — Other Ambulatory Visit: Payer: Self-pay | Admitting: Gastroenterology

## 2019-12-23 ENCOUNTER — Other Ambulatory Visit: Payer: Self-pay

## 2019-12-23 VITALS — Ht 68.0 in | Wt 227.4 lb

## 2019-12-23 DIAGNOSIS — Z86010 Personal history of colonic polyps: Secondary | ICD-10-CM

## 2019-12-23 MED ORDER — SUPREP BOWEL PREP KIT 17.5-3.13-1.6 GM/177ML PO SOLN: 1.0000 | Freq: Once | ORAL | 0 refills | Status: AC

## 2019-12-23 NOTE — Progress Notes (Signed)
Patient denies any allergies to egg or soy products. Patient denies complications with anesthesia/sedation.  Patient denies oxygen use at home and denies diet medications. Emmi instructions for colonoscopy explained and sent via mychart.

## 2019-12-24 ENCOUNTER — Encounter: Payer: Self-pay | Admitting: Gastroenterology

## 2019-12-24 ENCOUNTER — Telehealth: Payer: Self-pay | Admitting: Orthopedic Surgery

## 2019-12-24 MED ORDER — DIAZEPAM 5 MG PO TABS: ORAL_TABLET | ORAL | 0 refills | Status: DC

## 2019-12-24 NOTE — Telephone Encounter (Signed)
Ok for valium? 

## 2019-12-24 NOTE — Telephone Encounter (Signed)
I called to pharmacy. I left voicemail for patient with instructions and advised he would need a driver if using premedication.

## 2019-12-24 NOTE — Telephone Encounter (Signed)
Patient called.   He is requesting valium be called in in preparation for the MRI   Call back: [phone]

## 2019-12-24 NOTE — Addendum Note (Signed)
Addended by: Meyer Cory on: 12/24/2019 04:01 PM   Modules accepted: Orders

## 2019-12-24 NOTE — Telephone Encounter (Signed)
yes

## 2019-12-26 ENCOUNTER — Ambulatory Visit
Admission: RE | Admit: 2019-12-26 | Discharge: 2019-12-26 | Disposition: A | Discharge status: Home / Self Care | Payer: BC Managed Care – PPO | Source: Ambulatory Visit | Attending: Surgical | Admitting: Surgical

## 2019-12-26 DIAGNOSIS — M79602 Pain in left arm: Secondary | ICD-10-CM

## 2019-12-26 DIAGNOSIS — M4802 Spinal stenosis, cervical region: Secondary | ICD-10-CM | POA: Diagnosis not present

## 2019-12-29 ENCOUNTER — Ambulatory Visit (INDEPENDENT_AMBULATORY_CARE_PROVIDER_SITE_OTHER): Payer: BC Managed Care – PPO | Admitting: Orthopedic Surgery

## 2019-12-29 DIAGNOSIS — M79602 Pain in left arm: Secondary | ICD-10-CM | POA: Diagnosis not present

## 2019-12-31 ENCOUNTER — Telehealth: Payer: Self-pay | Admitting: Gastroenterology

## 2019-12-31 DIAGNOSIS — Z86010 Personal history of colonic polyps: Secondary | ICD-10-CM

## 2019-12-31 MED ORDER — SUTAB 1479-225-188 MG PO TABS: 1.0000 | ORAL_TABLET | Freq: Once | ORAL | 0 refills | Status: AC

## 2019-12-31 NOTE — Telephone Encounter (Signed)
Spoke with pt- prep's cost is $97.00  He is agreeable to taking Sutab and is aware the cost is $40 with the coupon.  New prep instructions sent via MyChart.

## 2019-12-31 NOTE — Telephone Encounter (Signed)
Patient called states the prep medication is too expensive would like an alternative sent

## 2020-01-02 ENCOUNTER — Encounter: Payer: Self-pay | Admitting: Orthopedic Surgery

## 2020-01-02 NOTE — Progress Notes (Signed)
Office Visit Note   Patient: Hayden Deleon           Date of Birth: 10/10/1955           MRN: [id number] Visit Date: 12/29/2019 Requested by: Libby Maw, [address] [place],  [place] [id number] PCP: Libby Maw, MD  Subjective: Chief Complaint  Patient presents with  . scan review    HPI: Nochum is a 64 year old patient with neck and left arm pain.  Since we have seen him he has had MRI scan which shows left foraminal disc extrusion at C7-T1 with resultant severe left C8 foraminal stenosis.  He also has other areas of foraminal stenosis severe bilateral at C4 and on the right at C5.  Severe left and moderate right C7 foraminal stenosis.  Coughing hurts him.  Pain hurts his posterior shoulder.  All of his symptoms are on the left none on the right.              ROS: All systems reviewed are negative as they relate to the chief complaint within the history of present illness.  Patient denies  fevers or chills.   Assessment & Plan: Visit Diagnoses:  1. Left arm pain     Plan: Impression is left arm pain with severe left-sided foraminal stenosis at multiple levels and a new disc at C7-T1 which is likely the offending structure.  Plan is refer to Dr. Ernestina Patches for C-spine ESI but I think this is likely heading for surgery.  If his symptoms are not helped enough with this injection I would recommend surgical evaluation with either Dr. Lorin Mercy or Louanne Skye or one of the neurosurgeons for this rather complicated neck problem.  Follow-Up Instructions: No follow-ups on file.   Orders:  Orders Placed This Encounter  Procedures  . Ambulatory referral to Physical Medicine Rehab   No orders of the defined types were placed in this encounter.     Procedures: No procedures performed   Clinical Data: No additional findings.  Objective: Vital Signs: There were no vitals taken for this visit.  Physical Exam:   Constitutional: Patient appears  well-developed HEENT:  Head: Normocephalic Eyes:EOM are normal Neck: Normal range of motion Cardiovascular: Normal rate Pulmonary/chest: Effort normal Neurologic: Patient is alert Skin: Skin is warm Psychiatric: Patient has normal mood and affect    Ortho Exam: Ortho exam demonstrates full active and passive range of motion of the right arm.  On the left-hand side there is no muscle atrophy.  Rotator cuff strength is good.  Motor sensory function hand is intact.  Mild C7-C8 paresthesias on the ulnar side of the forearm.  Negative Tinel's cubital tunnel in the elbow.  5 out of 5 grip EPL FPL interosseous wrist flexion extension bicep triceps and deltoid strength.  Specialty Comments:  No specialty comments available.  Imaging: No results found.   PMFS History: Patient Active Problem List   Diagnosis Date Noted  . History of gout 12/13/2019  . Healthcare maintenance 12/13/2019  . Coronary artery disease of native artery of native heart with stable angina pectoris ([facility]) 12/21/2018  . Shoulder arthritis 02/03/2018  . Gout 01/21/2018  . Low testosterone 01/21/2018  . BPV (benign positional vertigo), left 07/10/2017  . B12 deficiency 04/02/2017  . COPD (chronic obstructive pulmonary disease) ([place]) 10/07/2014  . Inguinal hernia of right side without obstruction or gangrene 07/31/2012  . OSA (obstructive sleep apnea) 05/01/2012  . CAD (coronary artery disease)   . Carotid  artery disease ([place])   . GERD (gastroesophageal reflux disease)   . Ejection fraction   . MIXED HYPERLIPIDEMIA 05/03/2010  . BENIGN POSITIONAL VERTIGO 05/03/2010  . Anxiety state 11/10/2008  . ANGIOEDEMA 08/18/2007  . SYSTOLIC MURMUR 10/62/6948  . DYSPHAGIA UNSPECIFIED 05/22/2007  . PROSTATITIS, HX OF 05/22/2007  . ADVEF, DRUG/MEDICINAL/BIOLOGICAL SUBST NOS 01/26/2007  . COLONIC POLYPS 12/31/2003  . GASTROESOPHAGEAL REFLUX DISEASE, CHRONIC 12/31/2003  . HIATAL HERNIA 12/31/2003  . DIVERTICULOSIS, COLON  12/31/2003  . HLD (hyperlipidemia) 05/02/2001  . HTN (hypertension) 05/02/2001   Past Medical History:  Diagnosis Date  . Allergy   . Anxiety   . Arthritis    right shoulder  . CAD (coronary artery disease)    Catheterization, 2003, occluded OM1, good collaterals  //   nuclear, August, 2009, low risk, EF 60%  . Carotid artery disease ([facility])    Doppler, February, 2012, 5-46% R. ICA, 27-03% LICA,  plan followup 1 year  . COPD (chronic obstructive pulmonary disease) ([facility])    Significant Dr. Lamonte Sakai  . Diverticulosis of colon   . Ejection fraction    EF 60%, nuclear, 2009  . GERD (gastroesophageal reflux disease)   . Gout   . Hyperlipidemia   . Hypertension   . Prostatitis   . Sleep apnea    uses cpap nightly    Family History  Problem Relation Age of Onset  . Angina Mother        car accident  . Asthma Father   . Crohn's disease Sister   . Colon cancer Neg Hx   . Esophageal cancer Neg Hx   . Rectal cancer Neg Hx   . Stomach cancer Neg Hx     Past Surgical History:  Procedure Laterality Date  . COLONOSCOPY  12/2003   [place] - polyp  . deviated septum    . KNEE ARTHROSCOPY     right  . TOTAL SHOULDER ARTHROPLASTY Right 02/03/2018   Procedure: RIGHT REVERSE SHOULDER ARTHROPLASTY;  Surgeon: Meredith Pel, MD;  Location: [place];  Service: Orthopedics;  Laterality: Right;  . WISDOM TOOTH EXTRACTION     Social History   Occupational History  . Occupation: Freight forwarder at [employer]    Employer: [employer]  Tobacco Use  . Smoking status: Former Smoker    Packs/day: 2.00    Years: 32.00    Pack years: 64.00    Types: Cigarettes    Quit date: 08/31/1999    Years since quitting: 20.3  . Smokeless tobacco: Former Network engineer  . Vaping Use: Never used  Substance and Sexual Activity  . Alcohol use: No    Alcohol/week: 0.0 standard drinks  . Drug use: No  . Sexual activity: Not on file

## 2020-01-06 ENCOUNTER — Other Ambulatory Visit: Payer: Self-pay

## 2020-01-06 ENCOUNTER — Ambulatory Visit (AMBULATORY_SURGERY_CENTER): Payer: BC Managed Care – PPO | Admitting: Gastroenterology

## 2020-01-06 ENCOUNTER — Encounter: Payer: Self-pay | Admitting: Gastroenterology

## 2020-01-06 VITALS — BP 118/41 | HR 53 | Temp 98.4°F | Resp 12 | Ht 68.0 in | Wt 227.0 lb

## 2020-01-06 DIAGNOSIS — D123 Benign neoplasm of transverse colon: Secondary | ICD-10-CM | POA: Diagnosis not present

## 2020-01-06 DIAGNOSIS — Z860100 Personal history of colon polyps, unspecified: Secondary | ICD-10-CM

## 2020-01-06 DIAGNOSIS — D122 Benign neoplasm of ascending colon: Secondary | ICD-10-CM | POA: Diagnosis not present

## 2020-01-06 DIAGNOSIS — D120 Benign neoplasm of cecum: Secondary | ICD-10-CM

## 2020-01-06 DIAGNOSIS — Z1211 Encounter for screening for malignant neoplasm of colon: Secondary | ICD-10-CM | POA: Diagnosis not present

## 2020-01-06 MED ORDER — SODIUM CHLORIDE 0.9 % IV SOLN: 500.0000 mL | Freq: Once | INTRAVENOUS | Status: DC

## 2020-01-06 NOTE — Progress Notes (Signed)
CW- VS  Pt's states no medical or surgical changes since previsit or office visit.  

## 2020-01-06 NOTE — Patient Instructions (Signed)
YOU HAD AN ENDOSCOPIC PROCEDURE TODAY AT THE [facility]:   Refer to the procedure report that was given to you for any specific questions about what was found during the examination.  If the procedure report does not answer your questions, please call your gastroenterologist to clarify.  If you requested that your care partner not be given the details of your procedure findings, then the procedure report has been included in a sealed envelope for you to review at your convenience later.  YOU SHOULD EXPECT: Some feelings of bloating in the abdomen. Passage of more gas than usual.  Walking can help get rid of the air that was put into your GI tract during the procedure and reduce the bloating. If you had a lower endoscopy (such as a colonoscopy or flexible sigmoidoscopy) you may notice spotting of blood in your stool or on the toilet paper. If you underwent a bowel prep for your procedure, you may not have a normal bowel movement for a few days.  Please Note:  You might notice some irritation and congestion in your nose or some drainage.  This is from the oxygen used during your procedure.  There is no need for concern and it should clear up in a day or so.  SYMPTOMS TO REPORT IMMEDIATELY:   Following lower endoscopy (colonoscopy or flexible sigmoidoscopy):  Excessive amounts of blood in the stool  Significant tenderness or worsening of abdominal pains  Swelling of the abdomen that is new, acute  Fever of 100F or higher  For urgent or emergent issues, a gastroenterologist can be reached at any hour by calling [phone]. Do not use MyChart messaging for urgent concerns.    DIET:  We do recommend a small meal at first, but then you may proceed to your regular diet.  Drink plenty of fluids but you should avoid alcoholic beverages for 24 hours.  ACTIVITY:  You should plan to take it easy for the rest of today and you should NOT DRIVE or use heavy machinery until tomorrow (because  of the sedation medicines used during the test).    FOLLOW UP: Our staff will call the number listed on your records 48-72 hours following your procedure to check on you and address any questions or concerns that you may have regarding the information given to you following your procedure. If we do not reach you, we will leave a message.  We will attempt to reach you two times.  During this call, we will ask if you have developed any symptoms of COVID 19. If you develop any symptoms (ie: fever, flu-like symptoms, shortness of breath, cough etc.) before then, please call [phone].  If you test positive for Covid 19 in the 2 weeks post procedure, please call and report this information to us.    If any biopsies were taken you will be contacted by phone or by letter within the next 1-3 weeks.  Please call us at [phone] if you have not heard about the biopsies in 3 weeks.    SIGNATURES/CONFIDENTIALITY: You and/or your care partner have signed paperwork which will be entered into your electronic medical record.  These signatures attest to the fact that that the information above on your After Visit Summary has been reviewed and is understood.  Full responsibility of the confidentiality of this discharge information lies with you and/or your care-partner. 

## 2020-01-06 NOTE — Op Note (Addendum)
[place] Patient Name: Hayden Deleon Procedure Date: 01/06/2020 7:31 AM MRN: [id number] Endoscopist: [name] Pole , MD Age: 64 Referring MD:  Date of Birth: February 22, 1956 Gender: Male Account #: [id number] Procedure:                Colonoscopy Indications:              High risk colon cancer surveillance: Personal                            history of colonic polyps, High risk colon cancer                            surveillance: Personal history of adenoma (10 mm or                            greater in size), High risk colon cancer                            surveillance: Personal history of multiple (3 or                            more) adenomas Medicines:                Monitored Anesthesia Care Procedure:                Pre-Anesthesia Assessment:                           - Prior to the procedure, a History and Physical                            was performed, and patient medications and                            allergies were reviewed. The patient's tolerance of                            previous anesthesia was also reviewed. The risks                            and benefits of the procedure and the sedation                            options and risks were discussed with the patient.                            All questions were answered, and informed consent                            was obtained. Prior Anticoagulants: The patient has                            taken no previous anticoagulant or antiplatelet  agents. ASA Grade Assessment: III - A patient with                            severe systemic disease. After reviewing the risks                            and benefits, the patient was deemed in                            satisfactory condition to undergo the procedure.                           After obtaining informed consent, the colonoscope                            was passed under direct vision. Throughout the                             procedure, the patient's blood pressure, pulse, and                            oxygen saturations were monitored continuously. The                            Colonoscope was introduced through the anus and                            advanced to the the cecum, identified by                            appendiceal orifice and ileocecal valve. The                            colonoscopy was performed without difficulty. The                            patient tolerated the procedure well. Scope In: 8:05:41 AM Scope Out: 8:30:32 AM Scope Withdrawal Time: 0 hours 16 minutes 44 seconds  Total Procedure Duration: 0 hours 24 minutes 51 seconds  Findings:                 The perianal and digital rectal examinations were                            normal.                           A less than 1 mm polyp was found in the transverse                            colon. The polyp was sessile. The polyp was removed                            with a cold biopsy forceps. Resection and retrieval  were complete.                           Sixteen sessile polyps were found in the transverse                            colon, ascending colon and cecum. The polyps were 5                            to 12 mm in size. These polyps were removed with a                            cold snare. Resection and retrieval were complete.                           Multiple small and large-mouthed diverticula were                            found in the sigmoid colon, descending colon,                            transverse colon and ascending colon.                           Non-bleeding internal hemorrhoids were found during                            retroflexion. The hemorrhoids were small. Complications:            No immediate complications. Estimated Blood Loss:     Estimated blood loss was minimal. Impression:               - One less than 1 mm polyp in the transverse colon,                             removed with a cold biopsy forceps. Resected and                            retrieved.                           - Sixteen 5 to 12 mm polyps in the transverse                            colon, in the ascending colon and in the cecum,                            removed with a cold snare. Resected and retrieved.                           - Moderate diverticulosis in the sigmoid colon, in                            the descending colon, in the transverse colon and  in the ascending colon.                           - Non-bleeding internal hemorrhoids. Recommendation:           - Patient has a contact number available for                            emergencies. The signs and symptoms of potential                            delayed complications were discussed with the                            patient. Return to normal activities tomorrow.                            Written discharge instructions were provided to the                            patient.                           - Resume previous diet.                           - Continue present medications.                           - Await pathology results.                           - Repeat colonoscopy in 1 year for surveillance                            based on pathology results.                           - Refer to genetic counselling and testing to                            exclude attenuated FAP or polyposis syndrome [name] Pole, MD 01/06/2020 8:38:39 AM This report has been signed electronically.

## 2020-01-06 NOTE — Progress Notes (Signed)
A/ox3, pleased with MAC, report to RN 

## 2020-01-06 NOTE — Progress Notes (Signed)
Called to room to assist during endoscopic procedure.  Patient ID and intended procedure confirmed with present staff. Received instructions for my participation in the procedure from the performing physician.  

## 2020-01-10 ENCOUNTER — Telehealth: Payer: Self-pay | Admitting: *Deleted

## 2020-01-10 NOTE — Telephone Encounter (Signed)
  Follow up Call-  Call back number 01/06/2020  Post procedure Call Back phone  # [phone]  Permission to leave phone message Yes  Some recent data might be hidden    [facility]

## 2020-01-10 NOTE — Telephone Encounter (Signed)
  Follow up Call-  Call back number 01/06/2020  Post procedure Call Back phone  # [phone]  Permission to leave phone message Yes  Some recent data might be hidden     Patient questions:  Do you have a fever, pain , or abdominal swelling? No. Pain Score  0 *  Have you tolerated food without any problems? Yes.    Have you been able to return to your normal activities? Yes.    Do you have any questions about your discharge instructions: Diet   No. Medications  No. Follow up visit  No.  Do you have questions or concerns about your Care? No.  Actions: * If pain score is 4 or above: No action needed, pain <4.  1. Have you developed a fever since your procedure? no  2.   Have you had an respiratory symptoms (SOB or cough) since your procedure? no  3.   Have you tested positive for COVID 19 since your procedure no  4.   Have you had any family members/close contacts diagnosed with the COVID 19 since your procedure?  no   If yes to any of these questions please route to Joylene John, RN and Joella Prince, RN

## 2020-01-12 ENCOUNTER — Other Ambulatory Visit: Payer: Self-pay

## 2020-01-12 DIAGNOSIS — D123 Benign neoplasm of transverse colon: Secondary | ICD-10-CM

## 2020-01-12 DIAGNOSIS — D120 Benign neoplasm of cecum: Secondary | ICD-10-CM

## 2020-01-12 DIAGNOSIS — D126 Benign neoplasm of colon, unspecified: Secondary | ICD-10-CM

## 2020-01-12 DIAGNOSIS — D122 Benign neoplasm of ascending colon: Secondary | ICD-10-CM

## 2020-01-13 ENCOUNTER — Telehealth: Payer: Self-pay | Admitting: Genetic Counselor

## 2020-01-13 NOTE — Telephone Encounter (Signed)
Scheduled appointment per new patient referral received on 10/13. Spoke to patient who is aware of appointment date and time.

## 2020-01-18 ENCOUNTER — Ambulatory Visit (INDEPENDENT_AMBULATORY_CARE_PROVIDER_SITE_OTHER): Payer: BC Managed Care – PPO | Admitting: Physical Medicine and Rehabilitation

## 2020-01-18 ENCOUNTER — Other Ambulatory Visit: Payer: Self-pay

## 2020-01-18 ENCOUNTER — Encounter: Payer: Self-pay | Admitting: Gastroenterology

## 2020-01-18 ENCOUNTER — Encounter: Payer: Self-pay | Admitting: Physical Medicine and Rehabilitation

## 2020-01-18 ENCOUNTER — Ambulatory Visit: Payer: Self-pay

## 2020-01-18 VITALS — BP 129/71 | HR 58

## 2020-01-18 DIAGNOSIS — M5412 Radiculopathy, cervical region: Secondary | ICD-10-CM | POA: Diagnosis not present

## 2020-01-18 DIAGNOSIS — M5010 Cervical disc disorder with radiculopathy, unspecified cervical region: Secondary | ICD-10-CM | POA: Diagnosis not present

## 2020-01-18 MED ORDER — BETAMETHASONE SOD PHOS & ACET 6 (3-3) MG/ML IJ SUSP
12.0000 mg | Freq: Once | INTRAMUSCULAR | Status: AC
  Administered 2020-01-18: 12 mg

## 2020-01-18 NOTE — Progress Notes (Signed)
Pt state neck and left shoulder pain. Pt state when he cough the pain gets worse. Pt state he takes pain meds and use heating and ice to ease the pain.  Numeric Pain Rating Scale and Functional Assessment Average Pain 3   In the last MONTH (on 0-10 scale) has pain interfered with the following?  1. General activity like being  able to carry out your everyday physical activities such as walking, climbing stairs, carrying groceries, or moving a chair?  Rating(8)   +Driver, -BT, -Dye Allergies.

## 2020-01-19 ENCOUNTER — Telehealth: Payer: Self-pay

## 2020-01-19 MED ORDER — SERTRALINE HCL 50 MG PO TABS
50.0000 mg | ORAL_TABLET | Freq: Every day | ORAL | 1 refills | Status: DC
Start: 2020-01-19 — End: 2020-07-20

## 2020-01-19 NOTE — Telephone Encounter (Signed)
Received a refill request for:  Sertraline HCL 50 mg tabs LR 12/21/18, #90, 1 rf  (Dr Deborra Medina) [facility] 12/13/19 (with you) FOV  None scheduled  Please review and advise.   Thanks.  Dm/cma

## 2020-01-19 NOTE — Telephone Encounter (Signed)
Okay to refill? 

## 2020-01-19 NOTE — Addendum Note (Signed)
Addended by: Konrad Saha on: 01/19/2020 11:33 AM   Modules accepted: Orders

## 2020-02-03 ENCOUNTER — Inpatient Hospital Stay: Payer: BC Managed Care – PPO | Attending: Genetic Counselor | Admitting: Genetic Counselor

## 2020-02-03 ENCOUNTER — Inpatient Hospital Stay: Payer: BC Managed Care – PPO

## 2020-02-06 NOTE — Progress Notes (Signed)
Hayden Deleon - 64 y.o. male MRN [id number]  Date of birth: 1955-07-31  Office Visit Note: Visit Date: 01/18/2020 PCP: Libby Maw, MD Referred by: Libby Maw,*  Subjective: Chief Complaint  Patient presents with  . Neck - Pain  . Left Shoulder - Pain   HPI:  Hayden Deleon is a 64 y.o. male who comes in today at the request of Dr. Anderson Malta for planned Left C7-T1 Cervical epidural steroid injection with fluoroscopic guidance.  The patient has failed conservative care including home exercise, medications, time and activity modification.  This injection will be diagnostic and hopefully therapeutic.  Please see requesting physician notes for further details and justification.  MRI reviewed with images and spine model.  MRI reviewed in the note below.  Unfortunately pretty significant multilevel cervical stenosis.  Also disc extrusion at C7-T1 on the left which is more foraminal.  Hopefully the disc extrusion is what is causing his symptoms and we get that calm down.  Unfortunately he may need surgical evaluation.    ROS Otherwise per HPI.  Assessment & Plan: Visit Diagnoses:  1. Cervical radiculopathy   2. Cervical disc disorder with radiculopathy     Plan: No additional findings.   Meds & Orders:  Meds ordered this encounter  Medications  . betamethasone acetate-betamethasone sodium phosphate (CELESTONE) injection 12 mg    Orders Placed This Encounter  Procedures  . XR C-ARM NO REPORT  . Epidural Steroid injection    Follow-up: Return for visit to requesting physician as needed.   Procedures: No procedures performed  Cervical Epidural Steroid Injection - Interlaminar Approach with Fluoroscopic Guidance  Patient: Hayden Deleon      Date of Birth: 1956-02-15 MRN: [id number] PCP: Libby Maw, MD      Visit Date: 01/18/2020   Universal Protocol:    Date/Time: 11/07/216:27 PM  Consent Given By: the patient  Position:  PRONE  Additional Comments: Vital signs were monitored before and after the procedure. Patient was prepped and draped in the usual sterile fashion. The correct patient, procedure, and site was verified.   Injection Procedure Details:   Procedure diagnoses:  1. Cervical radiculopathy   2. Cervical disc disorder with radiculopathy      Meds Administered:  Meds ordered this encounter  Medications  . betamethasone acetate-betamethasone sodium phosphate (CELESTONE) injection 12 mg     Laterality: Left  Location/Site: C7-T1  Needle size: 20 G  Needle type: Touhy  Needle Placement: Paramedian epidural space  Findings:  -Comments: Excellent flow of contrast into the epidural space.  Procedure Details: Using a paramedian approach from the side mentioned above, the region overlying the inferior lamina was localized under fluoroscopic visualization and the soft tissues overlying this structure were infiltrated with 4 ml. of 1% Lidocaine without Epinephrine. A # 20 gauge, Tuohy needle was inserted into the epidural space using a paramedian approach.  The epidural space was localized using loss of resistance along with contralateral oblique bi-planar fluoroscopic views.  After negative aspirate for air, blood, and CSF, a 2 ml. volume of Isovue-250 was injected into the epidural space and the flow of contrast was observed. Radiographs were obtained for documentation purposes.   The injectate was administered into the level noted above.  Additional Comments:  The patient tolerated the procedure well Dressing: 2 x 2 sterile gauze and Band-Aid    Post-procedure details: Patient was observed during the procedure. Post-procedure instructions were reviewed.  Patient left the clinic  in stable condition.     Clinical History: MRI CERVICAL SPINE WITHOUT CONTRAST  TECHNIQUE: Multiplanar, multisequence MR imaging of the cervical spine was performed. No intravenous contrast was  administered.  COMPARISON:  Prior radiograph from 12/01/2019.  FINDINGS: Alignment: Straightening of the normal cervical lordosis. Trace retrolisthesis of C5 on C6, with trace anterolisthesis of T1 on T2, chronic and degenerative.  Vertebrae: Vertebral body height maintained without acute or chronic fracture. Bone marrow signal intensity within normal limits. No worrisome osseous lesions. Scattered discogenic reactive endplate changes noted throughout the cervical spine, most pronounced at C5-6. No other abnormal marrow edema.  Cord: Signal intensity within the cervical spinal cord is within normal limits.  Posterior Fossa, vertebral arteries, paraspinal tissues: Visualized brain and posterior fossa within normal limits. Craniocervical junction normal. Paraspinous and prevertebral soft tissues within normal limits. Normal flow voids seen within the vertebral arteries bilaterally.  Disc levels:  C2-C3: Left eccentric disc bulge with mild uncovertebral hypertrophy. Moderate left-sided facet arthrosis. No spinal stenosis. Moderate left C3 foraminal narrowing. Right neural foramina remains patent.  C3-C4: Degenerative intervertebral disc space narrowing with diffuse disc osteophyte complex. Broad posterior component consisting mostly of soft disc material flattens and effaces the ventral thecal sac. Superimposed moderate left with mild right facet hypertrophy. Resultant moderate spinal stenosis. Severe left worse than right C4 foraminal stenosis.  C4-C5: Right eccentric disc bulge with associated right-sided uncovertebral hypertrophy. Exuberant right-sided facet degeneration. Flattening and partial effacement of the ventral thecal sac with resultant mild spinal stenosis. No significant cord deformity. Severe right C5 foraminal stenosis. Left neural foramen remains widely patent.  C5-C6: Degenerative intervertebral disc space narrowing with diffuse disc osteophyte  complex. Broad posterior component effaces the ventral thecal sac. Superimposed mild facet and ligament flavum hypertrophy. Mild cord flattening without cord signal changes. Resultant moderate to severe spinal stenosis. Thecal sac measures 6 mm in AP diameter. Severe bilateral C6 foraminal narrowing.  C6-C7: Degenerative intervertebral disc space narrowing with diffuse disc osteophyte complex. Flattening and effacement of the ventral thecal sac. Superimposed mild left greater than right facet hypertrophy. Resultant moderate spinal stenosis. Thecal sac measures 7 mm in AP diameter at its most narrow point. Severe left with moderate right C7 foraminal narrowing.  C7-T1: Left foraminal disc extrusion (series 7, image 22). Superimposed uncovertebral and endplate spurring, with left greater than right facet hypertrophy. No significant spinal stenosis. Severe left C8 foraminal narrowing. Right neural foramen remains patent.  Mild noncompressive disc bulging noted within the upper thoracic spine without significant spinal stenosis.  IMPRESSION: 1. Left foraminal disc extrusion at C7-T1 with resultant severe left C8 foraminal stenosis. Query left C8 radiculitis. 2. Additional multilevel cervical spondylosis with resultant moderate to severe spinal stenosis at C3-4 through C6-7. 3. Multifactorial degenerative changes with resultant multilevel foraminal narrowing as above. Notable findings include moderate left C3 foraminal stenosis, severe bilateral C4 and right C5 foraminal narrowing, with severe left and moderate right C7 foraminal stenosis.   Electronically Signed   By: Jeannine Boga M.D.   On: 12/27/2019 05:54     Objective:  VS:  HT:    WT:   BMI:     BP:129/71  HR:(!) 58bpm  TEMP: ( )  RESP:  Physical Exam Vitals and nursing note reviewed.  Constitutional:      General: He is not in acute distress.    Appearance: Normal appearance. He is not ill-appearing.    HENT:     Head: Normocephalic and atraumatic.     Right  Ear: External ear normal.     Left Ear: External ear normal.  Eyes:     Extraocular Movements: Extraocular movements intact.  Cardiovascular:     Rate and Rhythm: Normal rate.     Pulses: Normal pulses.  Abdominal:     General: There is no distension.     Palpations: Abdomen is soft.  Musculoskeletal:        General: No signs of injury.     Cervical back: Neck supple. Tenderness present. No rigidity.     Right lower leg: No edema.     Left lower leg: No edema.     Comments: Patient has good strength in the upper extremities with 5 out of 5 strength in wrist extension long finger flexion APB.  No intrinsic hand muscle atrophy.  Negative Hoffmann's test.  Lymphadenopathy:     Cervical: No cervical adenopathy.  Skin:    Findings: No erythema or rash.  Neurological:     General: No focal deficit present.     Mental Status: He is alert and oriented to person, place, and time.     Sensory: No sensory deficit.     Motor: No weakness or abnormal muscle tone.     Coordination: Coordination normal.  Psychiatric:        Mood and Affect: Mood normal.        Behavior: Behavior normal.      Imaging: No results found.

## 2020-02-06 NOTE — Procedures (Signed)
Cervical Epidural Steroid Injection - Interlaminar Approach with Fluoroscopic Guidance  Patient: Hayden Deleon      Date of Birth: 07/09/1955 MRN: [id number] PCP: Libby Maw, MD      Visit Date: 01/18/2020   Universal Protocol:    Date/Time: 11/07/216:27 PM  Consent Given By: the patient  Position: PRONE  Additional Comments: Vital signs were monitored before and after the procedure. Patient was prepped and draped in the usual sterile fashion. The correct patient, procedure, and site was verified.   Injection Procedure Details:   Procedure diagnoses:  1. Cervical radiculopathy   2. Cervical disc disorder with radiculopathy      Meds Administered:  Meds ordered this encounter  Medications  . betamethasone acetate-betamethasone sodium phosphate (CELESTONE) injection 12 mg     Laterality: Left  Location/Site: C7-T1  Needle size: 20 G  Needle type: Touhy  Needle Placement: Paramedian epidural space  Findings:  -Comments: Excellent flow of contrast into the epidural space.  Procedure Details: Using a paramedian approach from the side mentioned above, the region overlying the inferior lamina was localized under fluoroscopic visualization and the soft tissues overlying this structure were infiltrated with 4 ml. of 1% Lidocaine without Epinephrine. A # 20 gauge, Tuohy needle was inserted into the epidural space using a paramedian approach.  The epidural space was localized using loss of resistance along with contralateral oblique bi-planar fluoroscopic views.  After negative aspirate for air, blood, and CSF, a 2 ml. volume of Isovue-250 was injected into the epidural space and the flow of contrast was observed. Radiographs were obtained for documentation purposes.   The injectate was administered into the level noted above.  Additional Comments:  The patient tolerated the procedure well Dressing: 2 x 2 sterile gauze and Band-Aid    Post-procedure  details: Patient was observed during the procedure. Post-procedure instructions were reviewed.  Patient left the clinic in stable condition.

## 2020-03-01 ENCOUNTER — Ambulatory Visit (INDEPENDENT_AMBULATORY_CARE_PROVIDER_SITE_OTHER): Payer: BC Managed Care – PPO

## 2020-03-01 ENCOUNTER — Other Ambulatory Visit: Payer: Self-pay

## 2020-03-01 DIAGNOSIS — I6523 Occlusion and stenosis of bilateral carotid arteries: Secondary | ICD-10-CM | POA: Diagnosis not present

## 2020-03-03 ENCOUNTER — Telehealth: Payer: Self-pay

## 2020-03-03 DIAGNOSIS — I779 Disorder of arteries and arterioles, unspecified: Secondary | ICD-10-CM

## 2020-03-03 NOTE — Telephone Encounter (Signed)
DPR on file. °lmom with results. °Order placed for 1 yr repeat testing. ° °

## 2020-03-03 NOTE — Telephone Encounter (Signed)
-----   Message from Wellington Hampshire, MD sent at 03/03/2020  1:38 PM EST ----- Stable moderate left carotid stenosis. Repeat study in 1 year.

## 2020-03-09 ENCOUNTER — Ambulatory Visit: Payer: BC Managed Care – PPO | Admitting: Internal Medicine

## 2020-03-16 ENCOUNTER — Ambulatory Visit (INDEPENDENT_AMBULATORY_CARE_PROVIDER_SITE_OTHER): Payer: BC Managed Care – PPO | Admitting: Cardiovascular Disease

## 2020-03-16 ENCOUNTER — Encounter: Payer: Self-pay | Admitting: Cardiovascular Disease

## 2020-03-16 ENCOUNTER — Other Ambulatory Visit: Payer: Self-pay

## 2020-03-16 VITALS — BP 142/78 | HR 56 | Ht 70.0 in | Wt 225.1 lb

## 2020-03-16 DIAGNOSIS — E785 Hyperlipidemia, unspecified: Secondary | ICD-10-CM

## 2020-03-16 DIAGNOSIS — I25118 Atherosclerotic heart disease of native coronary artery with other forms of angina pectoris: Secondary | ICD-10-CM | POA: Diagnosis not present

## 2020-03-16 DIAGNOSIS — R079 Chest pain, unspecified: Secondary | ICD-10-CM | POA: Diagnosis not present

## 2020-03-16 DIAGNOSIS — I6522 Occlusion and stenosis of left carotid artery: Secondary | ICD-10-CM

## 2020-03-16 DIAGNOSIS — I10 Essential (primary) hypertension: Secondary | ICD-10-CM | POA: Diagnosis not present

## 2020-03-16 MED ORDER — NITROGLYCERIN 0.4 MG SL SUBL: 0.4000 mg | SUBLINGUAL_TABLET | SUBLINGUAL | 3 refills | Status: DC | PRN

## 2020-03-16 MED ORDER — AMLODIPINE BESYLATE 2.5 MG PO TABS: 2.5000 mg | ORAL_TABLET | Freq: Every day | ORAL | 11 refills | Status: DC

## 2020-03-16 NOTE — Progress Notes (Signed)
Cardiology Office Note   Date:  03/16/2020   ID:  Hayden, Deleon 04/30/1955, MRN [id number]  PCP:  Libby Maw, MD  Cardiologist:   [name], MD   Chief Complaint  Patient presents with  . Other    12 month f/u no complaints today. Meds reviewed verbally with pt.      History of Present Illness: Hayden Deleon is a 64 y.o. male who is here today for follow-up visit regarding coronary artery disease and stable angina. Previous cardiac catheterization in 2003 showed occluded OM1 with collaterals with 60-70% proximal RCA stenosis.  Ejection fraction was normal.  The patient has been managed medically since then.  He has known history of COPD and quit smoking in 2005.  He has hyperlipidemia, carotid disease and hypertension. He has prolonged history of stable angina with mild chest pain happening with overexertion.  He had a pharmacologic nuclear stress test done in October 2017 which was normal.  Echocardiogram in November 2017 was also normal. He did not tolerate Imdur in the past due to headache.  Echocardiogram in December of 2019 showed normal LV systolic function with no significant valvular abnormalities.  Over the last year, it seems that there has been worsening of his exertional chest pain.  This is happening now when he goes upstairs.  The pain lasted for 5 to 10 minutes and subsides with rest.  He does tend to do heavy work sometimes especially in the summertime.  On one occasion, he did heavy work and his chest pain lasted for 40 minutes.  No chest pain at rest.  The chest pain is described as substernal tightness with no radiation.  It is associated with shortness of breath.   Past Medical History:  Diagnosis Date  . Allergy   . Anxiety   . Arthritis    right shoulder  . CAD (coronary artery disease)    Catheterization, 2003, occluded OM1, good collaterals  //   nuclear, August, 2009, low risk, EF 60%  . Carotid artery disease ([place])     Doppler, February, 2012, 1-95% R. ICA, 09-32% LICA,  plan followup 1 year  . COPD (chronic obstructive pulmonary disease) (HCC)    Significant Dr. Lamonte Sakai  . Diverticulosis of colon   . Ejection fraction    EF 60%, nuclear, 2009  . GERD (gastroesophageal reflux disease)   . Gout   . Hyperlipidemia   . Hypertension   . Prostatitis   . Sleep apnea    uses cpap nightly    Past Surgical History:  Procedure Laterality Date  . COLONOSCOPY  12/2003   [facility] - polyp  . deviated septum    . KNEE ARTHROSCOPY     right  . TOTAL SHOULDER ARTHROPLASTY Right 02/03/2018   Procedure: RIGHT REVERSE SHOULDER ARTHROPLASTY;  Surgeon: Meredith Pel, MD;  Location: [facility];  Service: Orthopedics;  Laterality: Right;  . WISDOM TOOTH EXTRACTION       Current Outpatient Medications  Medication Sig Dispense Refill  . albuterol (VENTOLIN HFA) 108 (90 Base) MCG/ACT inhaler Inhale 2 puffs into the lungs every 6 (six) hours as needed for wheezing or shortness of breath. 1 g 10  . aspirin 81 MG tablet Take 81 mg by mouth daily.    [name] atorvastatin (LIPITOR) 40 MG tablet Take 1qd (Please sched appt with new provider) 90 tablet 1  . Fluticasone-Umeclidin-Vilant (TRELEGY ELLIPTA) 100-62.5-25 MCG/INH AEPB Inhale 1 puff into the lungs daily. 180 each 2  .  metoprolol tartrate (LOPRESSOR) 50 MG tablet Take 1bid (Plz sched appt with new provider) 180 tablet 0  . Multiple Vitamin (MULTIVITAMIN) capsule Take 1 capsule by mouth daily.    . sertraline (ZOLOFT) 50 MG tablet Take 1 tablet (50 mg total) by mouth daily. 90 tablet 1   No current facility-administered medications for this visit.    Allergies:   Avelox [moxifloxacin hcl in nacl], Sulfamethoxazole-trimethoprim, and Aspirin    Social History:  The patient  reports that he quit smoking about 20 years ago. His smoking use included cigarettes. He has a 64.00 pack-year smoking history. He has quit using smokeless tobacco. He reports that he does not drink  alcohol and does not use drugs.   Family History:  The patient's family history includes Angina in his mother; Asthma in his father; Crohn's disease in his sister.    ROS:  Please see the history of present illness.   Otherwise, review of systems are positive for none.   All other systems are reviewed and negative.    PHYSICAL EXAM: VS:  BP (!) 142/78 (BP Location: Left Arm, Patient Position: Sitting, Cuff Size: Normal)   Pulse (!) 56   Ht 5\' 10"  (1.778 m)   Wt 225 lb 2 oz (102.1 kg)   SpO2 92%   BMI 32.30 kg/m  , BMI Body mass index is 32.3 kg/m. GEN: Well nourished, well developed, in no acute distress  HEENT: normal  Neck: no JVD, carotid bruits, or masses Cardiac: RRR; no  rubs, or gallops,no edema . 1/6 holosystolic murmur at the apex. Respiratory:  clear to auscultation bilaterally, normal work of breathing GI: soft, nontender, nondistended, + BS MS: no deformity or atrophy  Skin: warm and dry, no rash Neuro:  Strength and sensation are intact Psych: euthymic mood, full affect   EKG:  EKG is ordered today. The ekg ordered today demonstrates normal sinus rhythm with no significant ST or T wave changes.   Recent Labs: 12/16/2019: ALT 12; BUN 9; Creatinine, Ser 0.94; Hemoglobin 14.3; Platelets 172.0; Potassium 4.3; Sodium 143; TSH 1.36    Lipid Panel    Component Value Date/Time   CHOL 136 12/16/2019 0809   TRIG 127.0 12/16/2019 0809   HDL 45.80 12/16/2019 0809   CHOLHDL 3 12/16/2019 0809   VLDL 25.4 12/16/2019 0809   LDLCALC 65 12/16/2019 0809   LDLDIRECT 71.0 01/03/2016 1201      Wt Readings from Last 3 Encounters:  03/16/20 225 lb 2 oz (102.1 kg)  01/06/20 227 lb (103 kg)  12/23/19 227 lb 6.4 oz (103.1 kg)       PAD Screen 01/17/2016  Previous PAD dx? No  Previous surgical procedure? No  Pain with walking? Yes  Subsides with rest? Yes  Feet/toe relief with dangling? No  Painful, non-healing ulcers? No  Extremities discolored? No       ASSESSMENT AND PLAN:  1.  Coronary artery disease involving native coronary arteries with worsening angina.  Previously, he had class II angina which has progressed to class III.  His EKG is normal.  I discussed different options with him including stress testing versus proceeding directly with cardiac catheterization.  He prefers stress testing.  I scheduled a Lexiscan Myoview.  2.  Hyperlipidemia: Continue treatment with atorvastatin.  Most recent lipid profile showed an LDL of 65.  3.  Essential hypertension: Blood pressure is mildly elevated.  Due to his blood pressures controlled.  Nonetheless, given his worsening angina, I elected to add small  dose amlodipine 2.5 mg twice daily.   4.  Left carotid stenosis: Stable moderate stenosis on recent Doppler.  Repeat study in 1 year.    Disposition:   FU with me in 6 months  Signed,   [name], MD  03/16/2020 8:35 AM    [place]

## 2020-03-16 NOTE — Patient Instructions (Addendum)
Medication Instructions:  Your physician has recommended you make the following change in your medication:   START Amlodipine 2.5 mg daily. An Rx has been sent to your pharmacy.  START sublingual Nitroglycerin as needed as directed. An Rx has been sent to your pharmacy.  *If you need a refill on your cardiac medications before your next appointment, please call your pharmacy*   Lab Work: None ordered If you have labs (blood work) drawn today and your tests are completely normal, you will receive your results only by: [name] MyChart Message (if you have MyChart) OR . A paper copy in the mail If you have any lab test that is abnormal or we need to change your treatment, we will call you to review the results.   Testing/Procedures: Your physician has requested that you have a lexiscan myoview. For further information please visit [email or web address]. Please follow instruction sheet, as given.     Follow-Up: At [facility], you and your health needs are our priority.  As part of our continuing mission to provide you with exceptional heart care, we have created designated Provider Care Teams.  These Care Teams include your primary Cardiologist (physician) and Advanced Practice Providers (APPs -  Physician Assistants and Nurse Practitioners) who all work together to provide you with the care you need, when you need it.  We recommend signing up for the patient portal called "MyChart".  Sign up information is provided on this After Visit Summary.  MyChart is used to connect with patients for Virtual Visits (Telemedicine).  Patients are able to view lab/test results, encounter notes, upcoming appointments, etc.  Non-urgent messages can be sent to your provider as well.   To learn more about what you can do with MyChart, go to [email or web address].    Your next appointment:   6 month(s)  The format for your next appointment:   In Person  Provider:   You may see Dr. Fletcher Anon or one of the  following Advanced Practice Providers on your designated Care Team:    Murray Hodgkins, NP  Christell Faith, PA-C  Marrianne Mood, PA-C  Cadence [place], [place]  Laurann Montana, NP    Other Instructions [place]  Your caregiver has ordered a Stress Test with nuclear imaging. The purpose of this test is to evaluate the blood supply to your heart muscle. This procedure is referred to as a "Non-Invasive Stress Test." This is because other than having an IV started in your vein, nothing is inserted or "invades" your body. Cardiac stress tests are done to find areas of poor blood flow to the heart by determining the extent of coronary artery disease (CAD). Some patients exercise on a treadmill, which naturally increases the blood flow to your heart, while others who are  unable to walk on a treadmill due to physical limitations have a pharmacologic/chemical stress agent called Lexiscan . This medicine will mimic walking on a treadmill by temporarily increasing your coronary blood flow.   Please note: these test may take anywhere between 2-4 hours to complete  PLEASE REPORT TO [facility] AT THE FIRST DESK WILL DIRECT YOU WHERE TO GO  Date of Procedure:_____________________________________  Arrival Time for Procedure:______________________________  Instructions regarding medication:     __X__:  Hold betablocker(Metoprolol) night before procedure and morning of procedure   PLEASE NOTIFY THE OFFICE AT LEAST 24 HOURS IN ADVANCE IF YOU ARE UNABLE TO KEEP YOUR APPOINTMENT.  [phone] AND  PLEASE NOTIFY NUCLEAR MEDICINE AT [facility]  AT LEAST 24 HOURS IN ADVANCE IF YOU ARE UNABLE TO KEEP YOUR APPOINTMENT. [phone]  How to prepare for your Myoview test:  1. Do not eat or drink after midnight 2. No caffeine for 24 hours prior to test 3. No smoking 24 hours prior to test. 4. Your medication may be taken with water.  If your doctor stopped a medication  because of this test, do not take that medication. 5. Please wear a short sleeve shirt. 6. No cologne or lotion. 7. Wear comfortable walking shoes. No heels!   Nitroglycerin sublingual tablets What is this medicine? NITROGLYCERIN (nye troe GLI ser in) is a type of vasodilator. It relaxes blood vessels, increasing the blood and oxygen supply to your heart. This medicine is used to relieve chest pain caused by angina. It is also used to prevent chest pain before activities like climbing stairs, going outdoors in cold weather, or sexual activity. This medicine may be used for other purposes; ask your health care provider or pharmacist if you have questions. COMMON BRAND NAME(S): Nitroquick, Nitrostat, Nitrotab What should I tell my health care provider before I take this medicine? They need to know if you have any of these conditions:  anemia  head injury, recent stroke, or bleeding in the brain  liver disease  previous heart attack  an unusual or allergic reaction to nitroglycerin, other medicines, foods, dyes, or preservatives  pregnant or trying to get pregnant  breast-feeding How should I use this medicine? Take this medicine by mouth as needed. At the first sign of an angina attack (chest pain or tightness) place one tablet under your tongue. You can also take this medicine 5 to 10 minutes before an event likely to produce chest pain. Follow the directions on the prescription label. Let the tablet dissolve under the tongue. Do not swallow whole. Replace the dose if you accidentally swallow it. It will help if your mouth is not dry. Saliva around the tablet will help it to dissolve more quickly. Do not eat or drink, smoke or chew tobacco while a tablet is dissolving. If you are not better within 5 minutes after taking ONE dose of nitroglycerin, call 9-1-1 immediately to seek emergency medical care. Do not take more than 3 nitroglycerin tablets over 15 minutes. If you take this medicine  often to relieve symptoms of angina, your doctor or health care professional may provide you with different instructions to manage your symptoms. If symptoms do not go away after following these instructions, it is important to call 9-1-1 immediately. Do not take more than 3 nitroglycerin tablets over 15 minutes. Talk to your pediatrician regarding the use of this medicine in children. Special care may be needed. Overdosage: If you think you have taken too much of this medicine contact a poison control center or emergency room at once. NOTE: This medicine is only for you. Do not share this medicine with others. What if I miss a dose? This does not apply. This medicine is only used as needed. What may interact with this medicine? Do not take this medicine with any of the following medications:  certain migraine medicines like ergotamine and dihydroergotamine (DHE)  medicines used to treat erectile dysfunction like sildenafil, tadalafil, and vardenafil  riociguat This medicine may also interact with the following medications:  alteplase  aspirin  heparin  medicines for high blood pressure  medicines for mental depression  other medicines used to treat angina  phenothiazines like chlorpromazine, mesoridazine, prochlorperazine, thioridazine This list may  not describe all possible interactions. Give your health care provider a list of all the medicines, herbs, non-prescription drugs, or dietary supplements you use. Also tell them if you smoke, drink alcohol, or use illegal drugs. Some items may interact with your medicine. What should I watch for while using this medicine? Tell your doctor or health care professional if you feel your medicine is no longer working. Keep this medicine with you at all times. Sit or lie down when you take your medicine to prevent falling if you feel dizzy or faint after using it. Try to remain calm. This will help you to feel better faster. If you feel dizzy,  take several deep breaths and lie down with your feet propped up, or bend forward with your head resting between your knees. You may get drowsy or dizzy. Do not drive, use machinery, or do anything that needs mental alertness until you know how this drug affects you. Do not stand or sit up quickly, especially if you are an older patient. This reduces the risk of dizzy or fainting spells. Alcohol can make you more drowsy and dizzy. Avoid alcoholic drinks. Do not treat yourself for coughs, colds, or pain while you are taking this medicine without asking your doctor or health care professional for advice. Some ingredients may increase your blood pressure. What side effects may I notice from receiving this medicine? Side effects that you should report to your doctor or health care professional as soon as possible:  blurred vision  dry mouth  skin rash  sweating  the feeling of extreme pressure in the head  unusually weak or tired Side effects that usually do not require medical attention (report to your doctor or health care professional if they continue or are bothersome):  flushing of the face or neck  headache  irregular heartbeat, palpitations  nausea, vomiting This list may not describe all possible side effects. Call your doctor for medical advice about side effects. You may report side effects to FDA at 1-800-FDA-1088. Where should I keep my medicine? Keep out of the reach of children. Store at room temperature between 20 and 25 degrees C (68 and 77 degrees F). Store in [facility] [employer]. Protect from light and moisture. Keep tightly closed. Throw away any unused medicine after the expiration date. NOTE: This sheet is a summary. It may not cover all possible information. If you have questions about this medicine, talk to your doctor, pharmacist, or health care provider.  2020 Elsevier/Gold Standard (2013-01-14 17:57:36)

## 2020-04-03 ENCOUNTER — Telehealth (INDEPENDENT_AMBULATORY_CARE_PROVIDER_SITE_OTHER): Payer: BC Managed Care – PPO | Admitting: Nurse Practitioner

## 2020-04-03 ENCOUNTER — Encounter: Payer: Self-pay | Admitting: Nurse Practitioner

## 2020-04-03 VITALS — Temp 97.6°F | Ht 71.0 in | Wt 223.0 lb

## 2020-04-03 DIAGNOSIS — J441 Chronic obstructive pulmonary disease with (acute) exacerbation: Secondary | ICD-10-CM | POA: Diagnosis not present

## 2020-04-03 MED ORDER — PREDNISONE 10 MG PO TABS: ORAL_TABLET | ORAL | 0 refills | Status: AC

## 2020-04-03 MED ORDER — DOXYCYCLINE HYCLATE 100 MG PO TABS: 100.0000 mg | ORAL_TABLET | Freq: Two times a day (BID) | ORAL | 0 refills | Status: AC

## 2020-04-03 NOTE — Patient Instructions (Signed)
Use albuterol inhaler: 2puffs every 8hrs x 2days then as needed. Take medications as prescribed. Maintain adequate oral hydration. call office if no improvement in 1week or sooner if symptoms worsen.  Chronic Obstructive Pulmonary Disease Exacerbation Chronic obstructive pulmonary disease (COPD) is a long-term (chronic) lung problem. In COPD, the flow of air from the lungs is limited. COPD exacerbations are times that breathing gets worse and you need more than your normal treatment. Without treatment, they can be life threatening. If they happen often, your lungs can become more damaged. If your COPD gets worse, your doctor may treat you with:  Medicines.  Oxygen.  Different ways to clear your airway, such as using a mask. Follow these instructions at home: Medicines  Take over-the-counter and prescription medicines only as told by your doctor.  If you take an antibiotic or steroid medicine, do not stop taking the medicine even if you start to feel better.  Keep up with shots (vaccinations) as told by your doctor. Be sure to get a yearly (annual) flu shot. Lifestyle  Do not smoke. If you need help quitting, ask your doctor.  Eat healthy foods.  Exercise regularly.  Get plenty of sleep.  Avoid tobacco smoke and other things that can bother your lungs.  Wash your hands often with soap and water. This will help keep you from getting an infection. If you cannot use soap and water, use hand sanitizer.  During flu season, avoid areas that are crowded with people. General instructions  Drink enough fluid to keep your pee (urine) clear or pale yellow. Do not do this if your doctor has told you not to.  Use a cool mist machine (vaporizer).  If you use oxygen or a machine that turns medicine into a mist (nebulizer), continue to use it as told.  Follow all instructions for rehabilitation. These are steps you can take to make your body work better.  Keep all follow-up visits as  told by your doctor. This is important. Contact a doctor if:  Your COPD symptoms get worse than normal. Get help right away if:  You are short of breath and it gets worse.  You have trouble talking.  You have chest pain.  You cough up blood.  You have a fever.  You keep throwing up (vomiting).  You feel weak or you pass out (faint).  You feel confused.  You are not able to sleep because of your symptoms.  You are not able to do daily activities. Summary  COPD exacerbations are times that breathing gets worse and you need more treatment than normal.  COPD exacerbations can be very serious and may cause your lungs to become more damaged.  Do not smoke. If you need help quitting, ask your doctor.  Stay up-to-date on your shots. Get a flu shot every year. This information is not intended to replace advice given to you by your health care provider. Make sure you discuss any questions you have with your health care provider. Document Revised: 02/28/2017 Document Reviewed: 04/22/2016 Elsevier Patient Education  2020 [employer].

## 2020-04-03 NOTE — Progress Notes (Addendum)
Virtual Visit via Telephone Note  I connected with@ on 04/03/20 at  8:15 AM EST by a video enabled telemedicine application and verified that I am speaking with the correct person using two identifiers.  Interactive audio and video telecommunications were attempted between this provider and patient, however failed, due to patient having technical difficulties.  We continued and completed visit with audio only.   Location: Patient:Home Provider: Office Participants: patient and provider  I discussed the limitations of evaluation and management by telemedicine and the availability of in person appointments. I also discussed with the patient that there may be a patient responsible charge related to this service. The patient expressed understanding and agreed to proceed.  CC:t c/o chest congestion along with shortness of breath. Pt states this is normal with is COPD and it gets worse around this time of the year. Productive cough with clear mucus. Pt denies fever, chill, or body aches.   History of Present Illness: Cough This is a chronic problem. The current episode started more than 1 year ago. The problem has been gradually worsening. The problem occurs constantly. The cough is productive of sputum. Associated symptoms include shortness of breath and wheezing. Pertinent negatives include no chest pain, chills, ear congestion, ear pain, fever, headaches, heartburn, hemoptysis, myalgias, nasal congestion, postnasal drip, rash, rhinorrhea, sore throat, sweats or weight loss. The symptoms are aggravated by lying down and cold air. He has tried a beta-agonist inhaler, ipratropium inhaler and steroid inhaler for the symptoms. The treatment provided no relief. His past medical history is significant for COPD.  last COVID-19 vaccine 07/2019, no booster dose yet. Denies any sick contact, no travel, no participation in large gathering.   Observations/Objective: Physical Exam Cardiovascular:     Rate  and Rhythm: Normal rate.  Neurological:     Mental Status: He is alert and oriented to person, place, and time.   limited due to audio only. normal speech  Assessment and Plan: [place] was seen today for acute visit.  Diagnoses and all orders for this visit:  Chronic obstructive pulmonary disease with acute exacerbation (HCC) -     predniSONE (DELTASONE) 10 MG tablet; Take 3 tablets (30 mg total) by mouth daily with breakfast for 2 days, THEN 2 tablets (20 mg total) daily with breakfast for 2 days, THEN 1 tablet (10 mg total) daily with breakfast for 2 days. -     doxycycline (VIBRA-TABS) 100 MG tablet; Take 1 tablet (100 mg total) by mouth 2 (two) times daily for 7 days.   Follow Up Instructions: Use albuterol inhaler: 2puffs every 8hrs x 2days then as needed. Take medications as prescribed. Maintain adequate oral hydration. call office if no improvement in 1week or sooner if symptoms worsen   I discussed the assessment and treatment plan with the patient. The patient was provided an opportunity to ask questions and all were answered. The patient agreed with the plan and demonstrated an understanding of the instructions.   The patient was advised to call back or seek an in-person evaluation if the symptoms worsen or if the condition fails to improve as anticipated.  I provided 15 minutes of non-face-to-face time during this encounter.  Alysia Penna, NP

## 2020-04-04 ENCOUNTER — Ambulatory Visit
Admission: RE | Admit: 2020-04-04 | Discharge: 2020-04-04 | Disposition: A | Discharge status: Home / Self Care | Payer: BC Managed Care – PPO | Source: Ambulatory Visit | Attending: Cardiovascular Disease | Admitting: Cardiovascular Disease

## 2020-04-04 ENCOUNTER — Other Ambulatory Visit: Payer: Self-pay

## 2020-04-04 DIAGNOSIS — I25118 Atherosclerotic heart disease of native coronary artery with other forms of angina pectoris: Secondary | ICD-10-CM | POA: Insufficient documentation

## 2020-04-04 DIAGNOSIS — R079 Chest pain, unspecified: Secondary | ICD-10-CM | POA: Insufficient documentation

## 2020-04-04 LAB — NM MYOCAR MULTI W/SPECT W/WALL MOTION / EF
Estimated workload: 1 METS
Exercise duration (min): 0 min
Exercise duration (sec): 0 s
LV dias vol: 73 mL (ref 62–150)
LV sys vol: 21 mL
MPHR: 156 {beats}/min
Peak HR: 78 {beats}/min
Percent HR: 50 %
Rest HR: 67 {beats}/min
TID: 0.94

## 2020-04-04 MED ORDER — REGADENOSON 0.4 MG/5ML IV SOLN
0.4000 mg | Freq: Once | INTRAVENOUS | Status: AC
  Administered 2020-04-04: 0.4 mg via INTRAVENOUS

## 2020-04-04 MED ORDER — TECHNETIUM TC 99M TETROFOSMIN IV KIT
30.0000 | PACK | Freq: Once | INTRAVENOUS | Status: AC | PRN
  Administered 2020-04-04: 31.617 via INTRAVENOUS

## 2020-04-04 MED ORDER — TECHNETIUM TC 99M TETROFOSMIN IV KIT
10.0000 | PACK | Freq: Once | INTRAVENOUS | Status: AC | PRN
  Administered 2020-04-04: 9.802 via INTRAVENOUS

## 2020-04-05 ENCOUNTER — Telehealth: Payer: Self-pay

## 2020-04-05 NOTE — Telephone Encounter (Signed)
Called to give the pt stress test results and Dr. Jari Sportsman recommendation. [email or web address].

## 2020-04-05 NOTE — Telephone Encounter (Signed)
-----   Message from Muhammad A Arida, MD sent at 04/05/2020 11:57 AM EST ----- Inform patient that  stress test was mildly abnormal.  If his chest pain improved with addition of amlodipine, we can continue medical therapy.  However, if there is no improvement, I suggest proceeding with left heart catheterization as was discussed during his most recent visit. The CT scan portion of the stress test showed that he has significant emphysematous changes in his lungs.  This was noted on prior CT scan in 2018.  Pulmonary evaluation might be helpful but I will leave that up to his primary care physician.  I included him on this message.  

## 2020-04-06 NOTE — Telephone Encounter (Signed)
-----   Message from Iran Ouch, MD sent at 04/05/2020 11:57 AM EST ----- Inform patient that  stress test was mildly abnormal.  If his chest pain improved with addition of amlodipine, we can continue medical therapy.  However, if there is no improvement, I suggest proceeding with left heart catheterization as was discussed during his most recent visit. The CT scan portion of the stress test showed that he has significant emphysematous changes in his lungs.  This was noted on prior CT scan in 2018.  Pulmonary evaluation might be helpful but I will leave that up to his primary care physician.  I included him on this message.

## 2020-04-06 NOTE — Telephone Encounter (Signed)
Patient made aware of stress test results and Dr. Jari Sportsman recommendation.  Patient sts that he has had some improvement in his chest pain since the addition of amlodipine. Pt sts that he has also been taking prednisone for his lung issues and thinks that is has helped as well.  Adv him that Dr. Kirke Corin has shared the results with his pcp and will defer to Dr. Doreene Burke to address the non-cardiac findings.  Adv the patient to contact the office if cardiac symptoms return or worsen. He will also  send a mychart msg in a few weeks to provide an update.

## 2020-04-11 ENCOUNTER — Other Ambulatory Visit: Payer: Self-pay | Admitting: Family Medicine

## 2020-04-20 ENCOUNTER — Ambulatory Visit (INDEPENDENT_AMBULATORY_CARE_PROVIDER_SITE_OTHER): Payer: BC Managed Care – PPO | Admitting: Internal Medicine

## 2020-04-20 ENCOUNTER — Other Ambulatory Visit: Payer: Self-pay

## 2020-04-20 ENCOUNTER — Encounter: Payer: Self-pay | Admitting: Internal Medicine

## 2020-04-20 VITALS — BP 130/66 | HR 54 | Temp 97.1°F | Ht 71.0 in | Wt 217.8 lb

## 2020-04-20 DIAGNOSIS — J449 Chronic obstructive pulmonary disease, unspecified: Secondary | ICD-10-CM | POA: Diagnosis not present

## 2020-04-20 DIAGNOSIS — G4733 Obstructive sleep apnea (adult) (pediatric): Secondary | ICD-10-CM | POA: Diagnosis not present

## 2020-04-20 NOTE — Progress Notes (Signed)
[facility] Consultation        Date: 04/20/2020  MRN# [id number] Hayden Deleon 1956-03-20    Hayden Deleon is a 64 y.o. old male seen in consultation for chief complaint of:  SYNOPSIS Established care for COPD and OSA  **CPAP download 07/2019 Excellent compliance report CPAP set at 7 AHI down to 2.6 93% for greater than 4 hours 9% for days  **CPAP download 05/22/2018- 08/19/2018.>>  Raw data present reviewed.  Average usage on days used is 6 hours 40 minutes.  Usage greater than 4 hours is 77/90 days.  CPAP set at 7.  Leaks are within normal limits.  Residual AHI is 2.  Overall this shows good compliance with CPAP with excellent control obstructive sleep apnea. **Pneumovax administered 12/30/16 **PFT 12/24/16; Spirometry: FVC was 62% of predicted, FEV1 was 33% of predicted, ratio is 43%. Flow volume loop is consistent with obstruction. There is significant improvement with bronchodilator therapy. Lung volumes: TLC is 78% of predicted, RV to TLC ratio was normal. DLCO is 49% of predicted. -Overall this test is consistent with severe COPD with reversibility, there may also be an element of mild restrictive lung disease.  **CBC 08/22/16; Eos=600 during an episode of AECOPD. At baseline he is at 300.  **Alpha-1 09/30/16>> normal.  **CT chest 09/06/16 shows severe diffuse panlobular emphysema, with some left basilar scarring, left pleural thickening, small effusion with leftward mediastinal shift.. **Sleep study 05/30/12>>AHI 35  12/28/2012 PSG showed -AHI 28 times/h  He did not sleep much on CPAP - start CPAP 7cm , pillows, humidity,  Download 10/28/12 on 7 cm - AHI 5/h, good usage, no leak  LAST OV DOWNLOAD excellent compliance report Patient uses CPAP 7 cm water pressure 90% compliance for greater than 4 hours 93% compliance for days there is no leak uses nasal pillows Previous AHI was 35    CC Follow up OSA Follow up COPD   HPI:   Follow up assessment  of COPD + shortness of breath +DOE need to be tested for exertional hypoxia-patient refused testing at last OV but willing to be tested now currently on triple therapy with Trelegy  +COPD exacerbation last month and resolved with PRED and ABX   No exacerbation at this time No evidence of heart failure at this time No evidence or signs of infection at this time No respiratory distress No fevers, chills, nausea, vomiting, diarrhea No evidence of lower extremity edema No evidence hemoptysis    Assessment of OSA Patient doing really well with controlling his sleep apnea AHI down to 2 compliance 87% days 77%>4 hrs  Previous weight 237 Current weight is 217 pounds goal weight next visit will be 195 pounds     Review of Systems:  Gen:  Denies  fever, sweats, chills weight loss  HEENT: Denies blurred vision, double vision, ear pain, eye pain, hearing loss, nose bleeds, sore throat Cardiac:  No dizziness, chest pain or heaviness, chest tightness,edema, No JVD Resp:   No cough, -sputum production, +shortness of breath,-wheezing, -hemoptysis,  Gi: Denies swallowing difficulty, stomach pain, nausea or vomiting, diarrhea, constipation, bowel incontinence Gu:  Denies bladder incontinence, burning urine Ext:   Denies Joint pain, stiffness or swelling Skin: Denies  skin rash, easy bruising or bleeding or hives Endoc:  Denies polyuria, polydipsia , polyphagia or weight change Psych:   Denies depression, insomnia or hallucinations  Other:  All other systems negative   BP 130/66 (BP Location: Left Arm, Patient Position: Sitting, Cuff  Size: Large)   Pulse (!) 54   Temp (!) 97.1 F (36.2 C) (Temporal)   Ht 5\' 11"  (1.803 m)   Wt 217 lb 12.8 oz (98.8 kg)   SpO2 93%   BMI 30.38 kg/m     Physical Examination:   General Appearance: No distress  Neuro:without focal findings,  speech normal,  HEENT: PERRLA, EOM intact.   Pulmonary: normal breath sounds, No wheezing.   CardiovascularNormal S1,S2.  No m/r/g.   Abdomen: Benign, Soft, non-tender. Renal:  No costovertebral tenderness  GU:  Not performed at this time. Endoc: No evident thyromegaly Skin:   warm, no rashes, no ecchymosis  Extremities: normal, no cyanosis, clubbing. PSYCHIATRIC: Mood, affect within normal limits.   ALL OTHER ROS ARE NEGATIVE   Allergies:  Avelox [moxifloxacin hcl in nacl], Sulfamethoxazole-trimethoprim, and Aspirin   Assessment and Plan:  65 year old white male with severe COPD based on pulmonary function testing of FEV1 of 33% predicted with underlying severe sleep apnea in the setting of obesity and deconditioned state    SOB and DOE Assess for exertional hypoxia with 6MWT  COPD severe Gold stage D  Continue Trelegy therapy Ventolin HFA albuterol as needed Avoid secondhand smoke exposure Pneumovax administered 2016 Prevnar at age 62 Alpha-1 normal No exacerbation at this time no indication for steroids or antibiotics   Assessment severe sleep apnea Patient has excellent compliance report at this time Patient uses and benefits from therapy Patient sleep apnea is well controlled  Obesity -recommend significant weight loss -recommend changing diet Patient currently weighs 224 pounds Weight goal in 6 months is 215 pounds   Deconditioned state -Recommend increased daily activity and exercise  COVID-19 EDUCATION: The signs and symptoms of COVID-19 were discussed with the patient and how to seek care for testing.  The importance of social distancing was discussed today. Hand Washing Techniques and avoid touching face was advised.     MEDICATION ADJUSTMENTS/LABS AND TESTS ORDERED: Continue Trelegy Ventolin HFA as needed Recommend weight loss Continue CPAP therapy Check 6WMT   CURRENT MEDICATIONS REVIEWED AT LENGTH WITH PATIENT TODAY   Patient satisfied with Plan of action and management. All questions answered  Follow up in 6  months  Total Time Spent 32 mins   Maretta Bees Patricia Pesa, M.D.  [facility]  Medical Director [place] Director [facility] Cardio-Pulmonary Department

## 2020-04-20 NOTE — Patient Instructions (Addendum)
Continue Trelegy Ventolin HFA as needed Recommend weight loss Continue CPAP therapy Check 6 MINUTE WALK TEST

## 2020-06-06 ENCOUNTER — Other Ambulatory Visit: Payer: Self-pay | Admitting: Internal Medicine

## 2020-06-06 DIAGNOSIS — J449 Chronic obstructive pulmonary disease, unspecified: Secondary | ICD-10-CM

## 2020-07-20 ENCOUNTER — Other Ambulatory Visit: Payer: Self-pay | Admitting: Family Medicine

## 2020-09-05 ENCOUNTER — Ambulatory Visit (INDEPENDENT_AMBULATORY_CARE_PROVIDER_SITE_OTHER): Payer: BC Managed Care – PPO | Admitting: Nurse Practitioner

## 2020-09-05 ENCOUNTER — Ambulatory Visit: Payer: BC Managed Care – PPO | Admitting: Cardiovascular Disease

## 2020-09-05 ENCOUNTER — Other Ambulatory Visit: Payer: Self-pay

## 2020-09-05 ENCOUNTER — Encounter: Payer: Self-pay | Admitting: *Deleted

## 2020-09-05 ENCOUNTER — Encounter: Payer: Self-pay | Admitting: Nurse Practitioner

## 2020-09-05 ENCOUNTER — Other Ambulatory Visit: Payer: Self-pay | Admitting: Internal Medicine

## 2020-09-05 VITALS — BP 130/70 | HR 58 | Ht 71.0 in | Wt 226.0 lb

## 2020-09-05 DIAGNOSIS — I10 Essential (primary) hypertension: Secondary | ICD-10-CM | POA: Diagnosis not present

## 2020-09-05 DIAGNOSIS — E785 Hyperlipidemia, unspecified: Secondary | ICD-10-CM

## 2020-09-05 DIAGNOSIS — J449 Chronic obstructive pulmonary disease, unspecified: Secondary | ICD-10-CM

## 2020-09-05 DIAGNOSIS — G4733 Obstructive sleep apnea (adult) (pediatric): Secondary | ICD-10-CM

## 2020-09-05 DIAGNOSIS — I6523 Occlusion and stenosis of bilateral carotid arteries: Secondary | ICD-10-CM

## 2020-09-05 DIAGNOSIS — I200 Unstable angina: Secondary | ICD-10-CM | POA: Diagnosis not present

## 2020-09-05 DIAGNOSIS — I25110 Atherosclerotic heart disease of native coronary artery with unstable angina pectoris: Secondary | ICD-10-CM | POA: Diagnosis not present

## 2020-09-05 MED ORDER — AMLODIPINE BESYLATE 5 MG PO TABS: 5.0000 mg | ORAL_TABLET | Freq: Every day | ORAL | 3 refills | Status: DC

## 2020-09-05 NOTE — Patient Instructions (Signed)
Medication Instructions:  Your physician has recommended you make the following change in your medication:   1. INCREASE Amlodipine to 5 mg once daily   *If you need a refill on your cardiac medications before your next appointment, please call your pharmacy*   Lab Work: None  If you have labs (blood work) drawn today and your tests are completely normal, you will receive your results only by: [name] MyChart Message (if you have MyChart) OR . A paper copy in the mail If you have any lab test that is abnormal or we need to change your treatment, we will call you to review the results.   Testing/Procedures:  Please call when ready to schedule your heart cath     Follow-Up: At [facility], you and your health needs are our priority.  As part of our continuing mission to provide you with exceptional heart care, we have created designated Provider Care Teams.  These Care Teams include your primary Cardiologist (physician) and Advanced Practice Providers (APPs -  Physician Assistants and Nurse Practitioners) who all work together to provide you with the care you need, when you need it.   Your next appointment:   1 month(s)  The format for your next appointment:   In Person  Provider:   Murray Hodgkins, NP

## 2020-09-05 NOTE — Progress Notes (Signed)
Office Visit    Patient Name: Dameian Crisman Date of Encounter: 09/05/2020  Primary Care Provider:  Libby Maw, MD Primary Cardiologist:  [name], MD  Chief Complaint    65 y/o ? w/ a h/o CAD, hypertension, hyperlipidemia, COPD, sleep apnea, and carotid arterial disease, who presents for follow-up of progressive angina.  Past Medical History    Past Medical History:  Diagnosis Date  . Allergy   . Anxiety   . Arthritis    right shoulder  . CAD (coronary artery disease)    a. 2003 Cath: OM1 100%, good collaterals, RCA 60-70p-->med rx; b. 10/2007 MV: low risk, EF 60%; c. 12/2015 MV: No isch/infarct; d. 04/2020 MV: mild ischemia to sm area of mid inflat & apical lat segments. EF >65%.  . Carotid artery disease ([place])    a. 03/2020 Carotid U/S: RICA 8-52%, LICA 77-82%.  [name] COPD (chronic obstructive pulmonary disease) (HCC)    Significant Dr. Lamonte Sakai  . Diastolic dysfunction    a. 03/2018 Echo: EF 60-65%, no rwma, Gr1 DD. Mildly dil LA. Nl RV fxn.  . Diverticulosis of colon   . Ejection fraction    EF 60%, nuclear, 2009  . GERD (gastroesophageal reflux disease)   . Gout   . Hyperlipidemia   . Hypertension   . Prostatitis   . Sleep apnea    uses cpap nightly   Past Surgical History:  Procedure Laterality Date  . COLONOSCOPY  12/2003   [facility] - polyp  . deviated septum    . KNEE ARTHROSCOPY     right  . TOTAL SHOULDER ARTHROPLASTY Right 02/03/2018   Procedure: RIGHT REVERSE SHOULDER ARTHROPLASTY;  Surgeon: Meredith Pel, MD;  Location: [facility];  Service: Orthopedics;  Laterality: Right;  . WISDOM TOOTH EXTRACTION      Allergies  Allergies  Allergen Reactions  . Avelox [Moxifloxacin Hcl In Nacl] Other (See Comments)    Facial swelling  . Sulfamethoxazole-Trimethoprim Other (See Comments)    REACTION: sore mouth  . Aspirin Other (See Comments)    REACTION: Nausea; fine with 81mg     History of Present Illness    65 year old male with  above past medical history including coronary artery disease, hypertension, hyperlipidemia, COPD, sleep apnea, and carotid arterial disease.  He previously underwent diagnostic catheterization in 2003 revealing an occluded OM1 with collaterals, as well as a 60-70% proximal RCA stenosis.  EF was normal.  He has been medically managed since then.  He quit smoking in 2005.  He has a prolonged history of stable angina with mild chest pain with overexertion.  Stress testing in October 2017 showed no evidence of ischemia or infarct.  He does not tolerate long-acting nitrates secondary to headaches.  Echocardiogram was in November 2017 and again in December 2019 showed normal LV function without significant valvular abnormalities.  He was last seen in cardiology clinic in December 2021, at which time he reported progressive angina.  Amlodipine therapy was added.  Stress testing was undertaken and was mildly abnormal with a small area of mild ischemia in the inferolateral and apical lateral segments.  EF was greater than 65%.  CT imaging showed coronary calcification and aortic atherosclerosis.  He was also noted to have bilateral groundglass opacities with more focal consolidation versus scarring in the left lower lobe.  Recommendation was made for dedicated chest CT if clinically appropriate.  Following his last visit, he initially thought that he had some improvement in exertional angina with the  addition of amlodipine but over the past several months, he feels as though he has developed a resistance and is now experiencing exertional chest discomfort about 2-3 times per week, usually with less exertion than what was previously required, associated with dyspnea, lasting up to about 10 minutes, and resolving with rest.  He has occasionally taken nitroglycerin.  With progression of symptoms, he feels that he is ready to pursue catheterization.  He denies palpitations, PND, orthopnea, dizziness, syncope, edema, or early  satiety.  Home Medications    Prior to Admission medications   Medication Sig Start Date End Date Taking? Authorizing Provider  albuterol (VENTOLIN HFA) 108 (90 Base) MCG/ACT inhaler TAKE 2 PUFFS BY MOUTH EVERY 6 HOURS AS NEEDED FOR WHEEZE OR SHORTNESS OF BREATH 09/05/20  Yes Kasa, Maretta Bees, MD  amLODipine (NORVASC) 2.5 MG tablet Take 1 tablet (2.5 mg total) by mouth daily. 03/16/20 06/14/20 Yes Wellington Hampshire, MD  aspirin 81 MG tablet Take 81 mg by mouth daily.   Yes [provider]  atorvastatin (LIPITOR) 40 MG tablet TAKE 1 TABLET BY MOUTH EVERY DAY 04/11/20  Yes Libby Maw, MD  metoprolol tartrate (LOPRESSOR) 50 MG tablet Take 1bid (Plz sched appt with new provider) 06/25/19  Yes Cirigliano, Garvin Fila, DO  Multiple Vitamin (MULTIVITAMIN) capsule Take 1 capsule by mouth daily.   Yes [provider]  nitroGLYCERIN (NITROSTAT) 0.4 MG SL tablet Place 1 tablet (0.4 mg total) under the tongue every 5 (five) minutes as needed for chest pain. 03/16/20 06/14/20 Yes Wellington Hampshire, MD  sertraline (ZOLOFT) 50 MG tablet TAKE 1 TABLET BY MOUTH EVERY DAY 07/20/20  Yes Libby Maw, MD  TRELEGY ELLIPTA 100-62.5-25 MCG/INH AEPB TAKE 1 PUFF BY MOUTH EVERY DAY 06/07/20  Yes Flora Lipps, MD   Family History    Family History  Problem Relation Age of Onset  . Angina Mother        car accident  . Asthma Father   . Crohn's disease Sister   . Colon cancer Neg Hx   . Esophageal cancer Neg Hx   . Rectal cancer Neg Hx   . Stomach cancer Neg Hx     Social History    Social History   Socioeconomic History  . Marital status: Married    Spouse name: Not on file  . Number of children: 0  . Years of education: Not on file  . Highest education level: Not on file  Occupational History  . Occupation: Freight forwarder at [employer]    Employer: [employer]  Tobacco Use  . Smoking status: Former Smoker    Packs/day: 2.00    Years: 32.00    Pack years: 64.00     Types: Cigarettes    Quit date: 08/31/1999    Years since quitting: 21.0  . Smokeless tobacco: Former Network engineer  . Vaping Use: Never used  Substance and Sexual Activity  . Alcohol use: No    Alcohol/week: 0.0 standard drinks  . Drug use: No  . Sexual activity: Not on file  Other Topics Concern  . Not on file  Social History Narrative  . Not on file   Social Determinants of Health   Financial Resource Strain: Not on file  Food Insecurity: Not on file  Transportation Needs: Not on file  Physical Activity: Not on file  Stress: Not on file  Social Connections: Not on file  Intimate Partner Violence: Not on file    Review of  Systems    As outlined above, patient has been having exertional chest discomfort.  This is now occurring with less exertion about twice a week, lasting about 10 minutes, resolving with rest.  He has occasionally had postprandial symptoms.  He also experiences dyspnea on exertion.  He denies palpitations, PND, orthopnea, dizziness, syncope, edema, or early satiety all other systems reviewed and are otherwise negative except as noted above.  Physical Exam    VS:  BP 130/70 (BP Location: Left Arm, Patient Position: Sitting, Cuff Size: Large)   Pulse (!) 58   Ht 5\' 11"  (1.803 m)   Wt 226 lb (102.5 kg)   SpO2 94%   BMI 31.52 kg/m  , BMI Body mass index is 31.52 kg/m. GEN: Obese, in no acute distress. HEENT: normal. Neck: Supple, no JVD, carotid bruits, or masses. Cardiac: RRR, no murmurs, rubs, or gallops. No clubbing, cyanosis, edema.  Radials/PT 2+ and equal bilaterally.  Respiratory:  Respirations regular and unlabored, clear to auscultation bilaterally. GI: Soft, nontender, nondistended, BS + x 4. MS: no deformity or atrophy. Skin: warm and dry, no rash. Neuro:  Strength and sensation are intact. Psych: Normal affect.  Accessory Clinical Findings    ECG personally reviewed by me today -sinus bradycardia, 58 - no acute changes.  Lab Results   Component Value Date   WBC 7.2 12/16/2019   HGB 14.3 12/16/2019   HCT 41.7 12/16/2019   MCV 96.8 12/16/2019   PLT 172.0 12/16/2019   Lab Results  Component Value Date   CREATININE 0.94 12/16/2019   BUN 9 12/16/2019   NA 143 12/16/2019   K 4.3 12/16/2019   CL 107 12/16/2019   CO2 30 12/16/2019   Lab Results  Component Value Date   ALT 12 12/16/2019   AST 11 12/16/2019   ALKPHOS 47 12/16/2019   BILITOT 0.6 12/16/2019   Lab Results  Component Value Date   CHOL 136 12/16/2019   HDL 45.80 12/16/2019   LDLCALC 65 12/16/2019   LDLDIRECT 71.0 01/03/2016   TRIG 127.0 12/16/2019   CHOLHDL 3 12/16/2019     Assessment & Plan    1.  Coronary artery disease/progressive exertional angina: Patient has a history of coronary artery disease status post catheterization in 2003 revealing an occluded OM1 with good collaterals, as well as a 60 to 70% stenosis in the proximal RCA.  He has been medically managed since then.  He has had chronic exertional angina, which historically was occurring at very high levels of activity and previous negative Myoviews.  Due to progressive symptoms late last year, he underwent stress testing in January which showed a small area of mild ischemia in the inferolateral and apical lateral segments, with normal LV function.  Amlodipine 2.5 mg daily was added and patient initially felt as though this improved anginal symptoms but over the past few months, he has noted a return of angina with relatively minimal exertion.  He occasionally has had postprandial symptoms but otherwise denies symptoms at rest or nocturnal symptoms.  We discussed options for management and he is now interested in cardiac catheterization.  The patient understands that risks include but are not limited to stroke (1 in 1000), death (1 in 39), kidney failure [usually temporary] (1 in 500), bleeding (1 in 200), allergic reaction [possibly serious] (1 in 200), and agrees to proceed.  He would like to  discuss the timing of his catheterization with his wife prior to scheduling.  He will call us back within  the next few days.  When he calls back, we will plan to follow-up a CBC and basic metabolic panel.  In the interim, I have increased his amlodipine to 5 mg daily and he otherwise remains on aspirin, statin, and beta-blocker.  2.  Essential hypertension: Blood pressure 130/70 today.  Increasing amlodipine to 5 mg daily in the setting of progressive angina.  He remains on beta-blocker therapy.  3.  Hyperlipidemia: LDL of 65 last September with normal LFTs at that time.  He remains on Lipitor 40 mg daily.  4.  Obstructive sleep apnea: Reports compliance with CPAP.  5.  Carotid arterial disease: Ultrasound in December 2021 showed stable 1 to 39% right internal carotid artery stenosis and 40 to 59% left internal carotid artery stenosis.  Continue aspirin and statin therapy.  Plan for follow-up ultrasound this December.  6.  Disposition: Patient will contact us within the next few days to schedule catheterization either at [facility] or [facility] pending Dr. Tyrell Antonio availability.  At that point we will arrange for follow-up labs.  Otherwise, plan to follow-up in clinic in approximately 1 month-dependent upon timing of cath.  Murray Hodgkins, NP 09/05/2020, 4:32 PM

## 2020-10-13 ENCOUNTER — Encounter: Payer: Self-pay | Admitting: Nurse Practitioner

## 2020-10-13 ENCOUNTER — Other Ambulatory Visit: Payer: Self-pay

## 2020-10-13 ENCOUNTER — Encounter: Payer: Self-pay | Admitting: *Deleted

## 2020-10-13 ENCOUNTER — Telehealth: Payer: Self-pay | Admitting: Nurse Practitioner

## 2020-10-13 ENCOUNTER — Ambulatory Visit (INDEPENDENT_AMBULATORY_CARE_PROVIDER_SITE_OTHER): Payer: BC Managed Care – PPO | Admitting: Nurse Practitioner

## 2020-10-13 VITALS — BP 130/70 | HR 54 | Ht 71.0 in | Wt 224.0 lb

## 2020-10-13 DIAGNOSIS — I25110 Atherosclerotic heart disease of native coronary artery with unstable angina pectoris: Secondary | ICD-10-CM | POA: Diagnosis not present

## 2020-10-13 DIAGNOSIS — E785 Hyperlipidemia, unspecified: Secondary | ICD-10-CM | POA: Diagnosis not present

## 2020-10-13 DIAGNOSIS — I10 Essential (primary) hypertension: Secondary | ICD-10-CM | POA: Diagnosis not present

## 2020-10-13 DIAGNOSIS — I2510 Atherosclerotic heart disease of native coronary artery without angina pectoris: Secondary | ICD-10-CM

## 2020-10-13 DIAGNOSIS — Z01812 Encounter for preprocedural laboratory examination: Secondary | ICD-10-CM

## 2020-10-13 DIAGNOSIS — G4733 Obstructive sleep apnea (adult) (pediatric): Secondary | ICD-10-CM | POA: Diagnosis not present

## 2020-10-13 DIAGNOSIS — I6523 Occlusion and stenosis of bilateral carotid arteries: Secondary | ICD-10-CM

## 2020-10-13 DIAGNOSIS — I200 Unstable angina: Secondary | ICD-10-CM

## 2020-10-13 MED ORDER — ISOSORBIDE MONONITRATE ER 30 MG PO TB24: 30.0000 mg | ORAL_TABLET | Freq: Every day | ORAL | 3 refills | Status: DC

## 2020-10-13 NOTE — H&P (View-Only) (Signed)
Office Visit    Patient Name: Hayden Deleon Date of Encounter: 10/13/2020  Primary Care Provider:  Libby Maw, MD Primary Cardiologist:  [name], MD  Chief Complaint    65 y/o ?  With a history of CAD, hypertension, hyperlipidemia, COPD, sleep apnea, and carotid arterial disease, who presents for follow-up  Past Medical History    Past Medical History:  Diagnosis Date   Allergy    Anxiety    Arthritis    right shoulder   CAD (coronary artery disease)    a. 2003 Cath: OM1 100%, good collaterals, RCA 60-70p-->med rx; b. 10/2007 MV: low risk, EF 60%; c. 12/2015 MV: No isch/infarct; d. 04/2020 MV: mild ischemia to sm area of mid inflat & apical lat segments. EF >65%.   Carotid artery disease ([place])    a. 03/2020 Carotid U/S: RICA 5-36%, LICA 14-43%.   COPD (chronic obstructive pulmonary disease) ([place])    Significant Dr. Lamonte Sakai   Diastolic dysfunction    a. 03/2018 Echo: EF 60-65%, no rwma, Gr1 DD. Mildly dil LA. Nl RV fxn.   Diverticulosis of colon    Ejection fraction    EF 60%, nuclear, 2009   GERD (gastroesophageal reflux disease)    Gout    Hyperlipidemia    Hypertension    Prostatitis    Sleep apnea    uses cpap nightly   Past Surgical History:  Procedure Laterality Date   COLONOSCOPY  12/2003   [facility] - polyp   deviated septum     KNEE ARTHROSCOPY     right   TOTAL SHOULDER ARTHROPLASTY Right 02/03/2018   Procedure: RIGHT REVERSE SHOULDER ARTHROPLASTY;  Surgeon: Meredith Pel, MD;  Location: [facility];  Service: Orthopedics;  Laterality: Right;   WISDOM TOOTH EXTRACTION      Allergies  Allergies  Allergen Reactions   Avelox [Moxifloxacin Hcl In Nacl] Other (See Comments)    Facial swelling   Sulfamethoxazole-Trimethoprim Other (See Comments)    REACTION: sore mouth   Aspirin Other (See Comments)    REACTION: Nausea; fine with 81mg     History of Present Illness    65 year old male with above past medical history including  CAD, hypertension, hyperlipidemia, COPD, sleep apnea, and carotid arterial disease.  He previously underwent diagnostic catheterization in 2003 revealing an occluded OM1 with collaterals, as well as a 60-70% proximal RCA stenosis.  EF was normal.  He has been medically managed since then.  He quit smoking in 2005.  He has a prolonged history of stable angina with mild chest pain with overexertion.  Stress testing in October 2017 showed no evidence of ischemia or infarct.  He does not tolerate long-acting nitrates secondary to headaches.  Echocardiogram was in November 2017 and again in December 2019 showed normal LV function without significant valvular abnormalities.  Repeat stress testing in January 2022 was performed in the setting of progressive angina and showed a small area of mild ischemia in the inferolateral and apical lateral segments.  EF was greater than 65%.  CT imaging showed coronary calcification and aortic atherosclerosis.  He was also noted to have bilateral groundglass opacities with more focal consolidation versus scarring in the left lower lobe.  Recommendation was made for dedicated chest CT if clinically appropriate.  He was last seen in cardiology clinic in June of this year at which time he reported exertional chest discomfort occurring about 2-3 times per week, with less provocation than previously required, with associated dyspnea, lasting  up to about 10 minutes, resolving with rest.  We discussed pursuing diagnostic catheterization and though patient was in agreement, he wanted to check his schedule before scheduling.  He has yet to schedule catheterization.  He says today, that his wife was ill with rheumatoid arthritis flare, and that is the primary reason he has yet to schedule.  He continues to have exertional chest discomfort and dyspnea when walking up 1 flight of stairs or doing anything around his home.  He said he was trying to dig holes earlier this week, and would have to  stop every 5 minutes secondary to chest pain and dyspnea.  He is interested in pursuing catheterization soon and will let us know within the next few weeks.   He denies palpitations, PND, orthopnea, dizziness, syncope, edema, or early satiety.  Home Medications    Prior to Admission medications   Medication Sig Start Date End Date Taking? Authorizing Provider  albuterol (VENTOLIN HFA) 108 (90 Base) MCG/ACT inhaler TAKE 2 PUFFS BY MOUTH EVERY 6 HOURS AS NEEDED FOR WHEEZE OR SHORTNESS OF BREATH 09/05/20   Flora Lipps, MD  amLODipine (NORVASC) 5 MG tablet Take 1 tablet (5 mg total) by mouth daily. 09/05/20 12/04/20  Theora Gianotti, NP  aspirin 81 MG tablet Take 81 mg by mouth daily.    [provider]  atorvastatin (LIPITOR) 40 MG tablet TAKE 1 TABLET BY MOUTH EVERY DAY 04/11/20   Libby Maw, MD  metoprolol tartrate (LOPRESSOR) 50 MG tablet Take 1bid (Plz sched appt with new provider) 06/25/19   Ronnald Nian, DO  Multiple Vitamin (MULTIVITAMIN) capsule Take 1 capsule by mouth daily.    [provider]  nitroGLYCERIN (NITROSTAT) 0.4 MG SL tablet Place 1 tablet (0.4 mg total) under the tongue every 5 (five) minutes as needed for chest pain. 03/16/20 06/14/20  Wellington Hampshire, MD  sertraline (ZOLOFT) 50 MG tablet TAKE 1 TABLET BY MOUTH EVERY DAY 07/20/20   Libby Maw, MD  TRELEGY ELLIPTA 100-62.5-25 MCG/INH AEPB TAKE 1 PUFF BY MOUTH EVERY DAY 06/07/20   Flora Lipps, MD   Family History    Family History  Problem Relation Age of Onset   Angina Mother        car accident   Asthma Father    Crohn's disease Sister    Colon cancer Neg Hx    Esophageal cancer Neg Hx    Rectal cancer Neg Hx    Stomach cancer Neg Hx     Social History    Social History   Socioeconomic History   Marital status: Married    Spouse name: Not on file   Number of children: 0   Years of education: Not on file   Highest education level: Not on file  Occupational  History   Occupation: Freight forwarder at [employer]    Employer: [employer]  Tobacco Use   Smoking status: Former    Packs/day: 2.00    Years: 32.00    Pack years: 64.00    Types: Cigarettes    Quit date: 08/31/1999    Years since quitting: 21.1   Smokeless tobacco: Former  Scientific laboratory technician Use: Never used  Substance and Sexual Activity   Alcohol use: No    Alcohol/week: 0.0 standard drinks   Drug use: No   Sexual activity: Not on file  Other Topics Concern   Not on file  Social History Narrative   Not on file  Social Determinants of Health   Financial Resource Strain: Not on file  Food Insecurity: Not on file  Transportation Needs: Not on file  Physical Activity: Not on file  Stress: Not on file  Social Connections: Not on file  Intimate Partner Violence: Not on file    Review of Systems    Ongoing exertional chest pain and dyspnea as outlined above.  He denies palpitations, PND, orthopnea, dizziness, syncope, edema, or early satiety.  All other systems reviewed and are otherwise negative except as noted above.  Physical Exam    VS:  BP 130/70 (BP Location: Left Arm, Patient Position: Sitting, Cuff Size: Normal)   Pulse (!) 54   Ht 5\' 11"  (1.803 m)   Wt 224 lb (101.6 kg)   SpO2 91%   BMI 31.24 kg/m  , BMI Body mass index is 31.24 kg/m.     GEN: Well nourished, well developed, in no acute distress. HEENT: normal. Neck: Supple, no JVD, carotid bruits, or masses. Cardiac: RRR, no murmurs, rubs, or gallops. No clubbing, cyanosis, edema.  Radials/PT 2+ and equal bilaterally.  Respiratory:  Respirations regular and unlabored, clear to auscultation bilaterally. GI: Soft, nontender, nondistended, BS + x 4. MS: no deformity or atrophy. Skin: warm and dry, no rash. Neuro:  Strength and sensation are intact. Psych: Normal affect.  Accessory Clinical Findings    ECG personally reviewed by me today -sinus bradycardia, 54 - no acute changes.  Lab  Results  Component Value Date   WBC 7.2 12/16/2019   HGB 14.3 12/16/2019   HCT 41.7 12/16/2019   MCV 96.8 12/16/2019   PLT 172.0 12/16/2019   Lab Results  Component Value Date   CREATININE 0.94 12/16/2019   BUN 9 12/16/2019   NA 143 12/16/2019   K 4.3 12/16/2019   CL 107 12/16/2019   CO2 30 12/16/2019   Lab Results  Component Value Date   ALT 12 12/16/2019   AST 11 12/16/2019   ALKPHOS 47 12/16/2019   BILITOT 0.6 12/16/2019   Lab Results  Component Value Date   CHOL 136 12/16/2019   HDL 45.80 12/16/2019   LDLCALC 65 12/16/2019   LDLDIRECT 71.0 01/03/2016   TRIG 127.0 12/16/2019   CHOLHDL 3 12/16/2019     Assessment & Plan    1.  Coronary artery disease/progressive exertional angina: Patient with a history of CAD status post catheterization in 2003 revealing an occluded OM1 with good collaterals, as well as a 60 to 70% stenosis in the proximal RCA.  Since January of this year, he has had progressive exertional chest pain and dyspnea and was noted to have a small area of mild ischemia in the inferolateral and apical lateral segments with normal LV function on stress testing in January.  He has continued to have progressive symptoms despite addition and titration of amlodipine therapy.  At last visit, we discussed catheterization and he had plan to schedule but has not gotten around to it yet.  He is still interested in pursuing catheterization and will contact us within the next week or 2 to schedule.  The patient understands that risks include but are not limited to stroke (1 in 1000), death (1 in 77), kidney failure [usually temporary] (1 in 500), bleeding (1 in 200), allergic reaction [possibly serious] (1 in 200), and agrees to proceed.  Once he is scheduled, we will arrange follow-up labs.  He remains on aspirin, statin, beta-blocker, and calcium channel blocker.  Given ongoing progressive symptoms, I will  add a long-acting nitrate today.  2.  Essential hypertension: Blood  pressure today is 130/70.  Continue beta-blocker and amlodipine.  I am adding long-acting nitrate given ongoing angina.  3.  Hyperlipidemia: LDL of 65 last September with normal LFTs at that time.  Continue atorvastatin therapy.  4.  Obstructive sleep apnea: Reports compliance with CPAP.  5.  Carotid arterial disease: Ultrasound in December 2021 showed stable 1 to 39% right internal carotid artery stenosis and 40 to 59% left internal carotid artery stenosis.  Continue aspirin and statin therapy.  Plan for follow-up ultrasound in December 2022.  6.  Disposition: Patient will contact us to schedule diagnostic catheterization with Dr. Fletcher Anon, at which point we can arrange for follow-up labs.  Plan for follow-up here 2 weeks post catheterization.   Murray Hodgkins, NP 10/13/2020, 8:31 AM

## 2020-10-13 NOTE — Telephone Encounter (Signed)
Spoke with patient and reviewed that we have his left heart catheterization scheduled for Monday October 30, 2020 with arrival time of 08:30 am. Also reviewed that we have him scheduled for follow up after his procedure here on 11/16/20 at 10:30 am. Patient also needs labs done prior to procedure and advised to go sometime the week before to have those done. Labs ordered for him to have done over at the [facility] entrance and to check in at registration. Reviewed that I would send him My Chart message with all of this information with instructions. He verbalized understanding and had no further questions at this time. Will also mail hard copy of this information to patient as well.

## 2020-10-13 NOTE — Addendum Note (Signed)
Addended by: Rogelia Mire on: 10/13/2020 04:48 PM   Modules accepted: Orders, SmartSet

## 2020-10-13 NOTE — Telephone Encounter (Signed)
Late entry. Patient called in to schedule left heart cath for 1-2 weeks out. Offered August 1st and he was agreeable with that and will call him back with specific times for that and his follow up. He was appreciative.

## 2020-10-13 NOTE — Patient Instructions (Addendum)
Medication Instructions:  Your physician has recommended you make the following change in your medication:   START Isosorbide mononitrate (Imdur) 30 mg once daily   *If you need a refill on your cardiac medications before your next appointment, please call your pharmacy*   Lab Work: None  If you have labs (blood work) drawn today and your tests are completely normal, you will receive your results only by: Hayden Deleon (if you have MyChart) OR A paper copy in the mail If you have any lab test that is abnormal or we need to change your treatment, we will call you to review the results.   Testing/Procedures: Please call when you are ready to schedule this procedure and follow up appointment in 2 weeks.    [facility] Cardiac Cath Instructions  You are scheduled for a Cardiac Cath on:_________________________ Please arrive at _______am on the day of your procedure Please expect a call from our [facility] to pre-register you Do not eat/drink anything after midnight Someone will need to drive you home It is recommended someone be with you for the first 24 hours after your procedure Wear clothes that are easy to get on/off and wear slip on shoes if possible   Medications bring a current list of all medications with you  _XX__ You may take all of your medications the morning of your procedure with enough water to swallow safely    Day of your procedure: Arrive at the [facility] entrance.  Free valet service is available.  After entering the [facility] please check-in at the registration desk (1st desk on your right) to receive your armband. After receiving your armband someone will escort you to the cardiac cath/special procedures waiting area.  The usual length of stay after your procedure is about 2 to 3 hours.  This can vary.  If you have any questions, please call our office at [phone], or you may call the cardiac cath lab at [facility] directly at  [phone]    Follow-Up: At [facility], you and your health needs are our priority.  As part of our continuing mission to provide you with exceptional heart care, we have created designated Provider Care Teams.  These Care Teams include your primary Cardiologist (physician) and Advanced Practice Providers (APPs -  Physician Assistants and Nurse Practitioners) who all work together to provide you with the care you need, when you need it.   Your next appointment:    Once you call back for procedure at that time we can also schedule your 2 week post cath appointment.   The format for your next appointment:   In Person  Provider:   [name], MD or Hayden Hodgkins, NP

## 2020-10-13 NOTE — Progress Notes (Signed)
Office Visit    Patient Name: Hayden Deleon Date of Encounter: 10/13/2020  Primary Care Provider:  Libby Maw, MD Primary Cardiologist:  [name], MD  Chief Complaint    65 y/o ?  With a history of CAD, hypertension, hyperlipidemia, COPD, sleep apnea, and carotid arterial disease, who presents for follow-up  Past Medical History    Past Medical History:  Diagnosis Date   Allergy    Anxiety    Arthritis    right shoulder   CAD (coronary artery disease)    a. 2003 Cath: OM1 100%, good collaterals, RCA 60-70p-->med rx; b. 10/2007 MV: low risk, EF 60%; c. 12/2015 MV: No isch/infarct; d. 04/2020 MV: mild ischemia to sm area of mid inflat & apical lat segments. EF >65%.   Carotid artery disease ([place])    a. 03/2020 Carotid U/S: RICA 2-59%, LICA 56-38%.   COPD (chronic obstructive pulmonary disease) ([place])    Significant Dr. Lamonte Sakai   Diastolic dysfunction    a. 03/2018 Echo: EF 60-65%, no rwma, Gr1 DD. Mildly dil LA. Nl RV fxn.   Diverticulosis of colon    Ejection fraction    EF 60%, nuclear, 2009   GERD (gastroesophageal reflux disease)    Gout    Hyperlipidemia    Hypertension    Prostatitis    Sleep apnea    uses cpap nightly   Past Surgical History:  Procedure Laterality Date   COLONOSCOPY  12/2003   [facility] - polyp   deviated septum     KNEE ARTHROSCOPY     right   TOTAL SHOULDER ARTHROPLASTY Right 02/03/2018   Procedure: RIGHT REVERSE SHOULDER ARTHROPLASTY;  Surgeon: Meredith Pel, MD;  Location: [facility];  Service: Orthopedics;  Laterality: Right;   WISDOM TOOTH EXTRACTION      Allergies  Allergies  Allergen Reactions   Avelox [Moxifloxacin Hcl In Nacl] Other (See Comments)    Facial swelling   Sulfamethoxazole-Trimethoprim Other (See Comments)    REACTION: sore mouth   Aspirin Other (See Comments)    REACTION: Nausea; fine with 81mg     History of Present Illness    65 year old male with above past medical history including  CAD, hypertension, hyperlipidemia, COPD, sleep apnea, and carotid arterial disease.  He previously underwent diagnostic catheterization in 2003 revealing an occluded OM1 with collaterals, as well as a 60-70% proximal RCA stenosis.  EF was normal.  He has been medically managed since then.  He quit smoking in 2005.  He has a prolonged history of stable angina with mild chest pain with overexertion.  Stress testing in October 2017 showed no evidence of ischemia or infarct.  He does not tolerate long-acting nitrates secondary to headaches.  Echocardiogram was in November 2017 and again in December 2019 showed normal LV function without significant valvular abnormalities.  Repeat stress testing in January 2022 was performed in the setting of progressive angina and showed a small area of mild ischemia in the inferolateral and apical lateral segments.  EF was greater than 65%.  CT imaging showed coronary calcification and aortic atherosclerosis.  He was also noted to have bilateral groundglass opacities with more focal consolidation versus scarring in the left lower lobe.  Recommendation was made for dedicated chest CT if clinically appropriate.  He was last seen in cardiology clinic in June of this year at which time he reported exertional chest discomfort occurring about 2-3 times per week, with less provocation than previously required, with associated dyspnea, lasting  up to about 10 minutes, resolving with rest.  We discussed pursuing diagnostic catheterization and though patient was in agreement, he wanted to check his schedule before scheduling.  He has yet to schedule catheterization.  He says today, that his wife was ill with rheumatoid arthritis flare, and that is the primary reason he has yet to schedule.  He continues to have exertional chest discomfort and dyspnea when walking up 1 flight of stairs or doing anything around his home.  He said he was trying to dig holes earlier this week, and would have to  stop every 5 minutes secondary to chest pain and dyspnea.  He is interested in pursuing catheterization soon and will let us know within the next few weeks.   He denies palpitations, PND, orthopnea, dizziness, syncope, edema, or early satiety.  Home Medications    Prior to Admission medications   Medication Sig Start Date End Date Taking? Authorizing Provider  albuterol (VENTOLIN HFA) 108 (90 Base) MCG/ACT inhaler TAKE 2 PUFFS BY MOUTH EVERY 6 HOURS AS NEEDED FOR WHEEZE OR SHORTNESS OF BREATH 09/05/20   Flora Lipps, MD  amLODipine (NORVASC) 5 MG tablet Take 1 tablet (5 mg total) by mouth daily. 09/05/20 12/04/20  Theora Gianotti, NP  aspirin 81 MG tablet Take 81 mg by mouth daily.    [provider]  atorvastatin (LIPITOR) 40 MG tablet TAKE 1 TABLET BY MOUTH EVERY DAY 04/11/20   Libby Maw, MD  metoprolol tartrate (LOPRESSOR) 50 MG tablet Take 1bid (Plz sched appt with new provider) 06/25/19   Ronnald Nian, DO  Multiple Vitamin (MULTIVITAMIN) capsule Take 1 capsule by mouth daily.    [provider]  nitroGLYCERIN (NITROSTAT) 0.4 MG SL tablet Place 1 tablet (0.4 mg total) under the tongue every 5 (five) minutes as needed for chest pain. 03/16/20 06/14/20  Wellington Hampshire, MD  sertraline (ZOLOFT) 50 MG tablet TAKE 1 TABLET BY MOUTH EVERY DAY 07/20/20   Libby Maw, MD  TRELEGY ELLIPTA 100-62.5-25 MCG/INH AEPB TAKE 1 PUFF BY MOUTH EVERY DAY 06/07/20   Flora Lipps, MD   Family History    Family History  Problem Relation Age of Onset   Angina Mother        car accident   Asthma Father    Crohn's disease Sister    Colon cancer Neg Hx    Esophageal cancer Neg Hx    Rectal cancer Neg Hx    Stomach cancer Neg Hx     Social History    Social History   Socioeconomic History   Marital status: Married    Spouse name: Not on file   Number of children: 0   Years of education: Not on file   Highest education level: Not on file  Occupational  History   Occupation: Freight forwarder at [employer]    Employer: [employer]  Tobacco Use   Smoking status: Former    Packs/day: 2.00    Years: 32.00    Pack years: 64.00    Types: Cigarettes    Quit date: 08/31/1999    Years since quitting: 21.1   Smokeless tobacco: Former  Scientific laboratory technician Use: Never used  Substance and Sexual Activity   Alcohol use: No    Alcohol/week: 0.0 standard drinks   Drug use: No   Sexual activity: Not on file  Other Topics Concern   Not on file  Social History Narrative   Not on file  Social Determinants of Health   Financial Resource Strain: Not on file  Food Insecurity: Not on file  Transportation Needs: Not on file  Physical Activity: Not on file  Stress: Not on file  Social Connections: Not on file  Intimate Partner Violence: Not on file    Review of Systems    Ongoing exertional chest pain and dyspnea as outlined above.  He denies palpitations, PND, orthopnea, dizziness, syncope, edema, or early satiety.  All other systems reviewed and are otherwise negative except as noted above.  Physical Exam    VS:  BP 130/70 (BP Location: Left Arm, Patient Position: Sitting, Cuff Size: Normal)   Pulse (!) 54   Ht 5\' 11"  (1.803 m)   Wt 224 lb (101.6 kg)   SpO2 91%   BMI 31.24 kg/m  , BMI Body mass index is 31.24 kg/m.     GEN: Well nourished, well developed, in no acute distress. HEENT: normal. Neck: Supple, no JVD, carotid bruits, or masses. Cardiac: RRR, no murmurs, rubs, or gallops. No clubbing, cyanosis, edema.  Radials/PT 2+ and equal bilaterally.  Respiratory:  Respirations regular and unlabored, clear to auscultation bilaterally. GI: Soft, nontender, nondistended, BS + x 4. MS: no deformity or atrophy. Skin: warm and dry, no rash. Neuro:  Strength and sensation are intact. Psych: Normal affect.  Accessory Clinical Findings    ECG personally reviewed by me today -sinus bradycardia, 54 - no acute changes.  Lab  Results  Component Value Date   WBC 7.2 12/16/2019   HGB 14.3 12/16/2019   HCT 41.7 12/16/2019   MCV 96.8 12/16/2019   PLT 172.0 12/16/2019   Lab Results  Component Value Date   CREATININE 0.94 12/16/2019   BUN 9 12/16/2019   NA 143 12/16/2019   K 4.3 12/16/2019   CL 107 12/16/2019   CO2 30 12/16/2019   Lab Results  Component Value Date   ALT 12 12/16/2019   AST 11 12/16/2019   ALKPHOS 47 12/16/2019   BILITOT 0.6 12/16/2019   Lab Results  Component Value Date   CHOL 136 12/16/2019   HDL 45.80 12/16/2019   LDLCALC 65 12/16/2019   LDLDIRECT 71.0 01/03/2016   TRIG 127.0 12/16/2019   CHOLHDL 3 12/16/2019     Assessment & Plan    1.  Coronary artery disease/progressive exertional angina: Patient with a history of CAD status post catheterization in 2003 revealing an occluded OM1 with good collaterals, as well as a 60 to 70% stenosis in the proximal RCA.  Since January of this year, he has had progressive exertional chest pain and dyspnea and was noted to have a small area of mild ischemia in the inferolateral and apical lateral segments with normal LV function on stress testing in January.  He has continued to have progressive symptoms despite addition and titration of amlodipine therapy.  At last visit, we discussed catheterization and he had plan to schedule but has not gotten around to it yet.  He is still interested in pursuing catheterization and will contact us within the next week or 2 to schedule.  The patient understands that risks include but are not limited to stroke (1 in 1000), death (1 in 43), kidney failure [usually temporary] (1 in 500), bleeding (1 in 200), allergic reaction [possibly serious] (1 in 200), and agrees to proceed.  Once he is scheduled, we will arrange follow-up labs.  He remains on aspirin, statin, beta-blocker, and calcium channel blocker.  Given ongoing progressive symptoms, I will  add a long-acting nitrate today.  2.  Essential hypertension: Blood  pressure today is 130/70.  Continue beta-blocker and amlodipine.  I am adding long-acting nitrate given ongoing angina.  3.  Hyperlipidemia: LDL of 65 last September with normal LFTs at that time.  Continue atorvastatin therapy.  4.  Obstructive sleep apnea: Reports compliance with CPAP.  5.  Carotid arterial disease: Ultrasound in December 2021 showed stable 1 to 39% right internal carotid artery stenosis and 40 to 59% left internal carotid artery stenosis.  Continue aspirin and statin therapy.  Plan for follow-up ultrasound in December 2022.  6.  Disposition: Patient will contact us to schedule diagnostic catheterization with Dr. Fletcher Anon, at which point we can arrange for follow-up labs.  Plan for follow-up here 2 weeks post catheterization.   Murray Hodgkins, NP 10/13/2020, 8:31 AM

## 2020-10-20 ENCOUNTER — Other Ambulatory Visit: Payer: Self-pay | Admitting: Family Medicine

## 2020-10-24 ENCOUNTER — Other Ambulatory Visit
Admission: RE | Admit: 2020-10-24 | Discharge: 2020-10-24 | Disposition: A | Discharge status: Home / Self Care | Payer: BC Managed Care – PPO | Source: Ambulatory Visit | Attending: Nurse Practitioner | Admitting: Nurse Practitioner

## 2020-10-24 DIAGNOSIS — Z01812 Encounter for preprocedural laboratory examination: Secondary | ICD-10-CM | POA: Diagnosis not present

## 2020-10-24 DIAGNOSIS — I2510 Atherosclerotic heart disease of native coronary artery without angina pectoris: Secondary | ICD-10-CM | POA: Insufficient documentation

## 2020-10-24 DIAGNOSIS — I200 Unstable angina: Secondary | ICD-10-CM | POA: Insufficient documentation

## 2020-10-24 LAB — BASIC METABOLIC PANEL
Anion gap: 9 (ref 5–15)
BUN: 17 mg/dL (ref 8–23)
CO2: 25 mmol/L (ref 22–32)
Calcium: 9.5 mg/dL (ref 8.9–10.3)
Chloride: 107 mmol/L (ref 98–111)
Creatinine, Ser: 1.1 mg/dL (ref 0.61–1.24)
GFR, Estimated: >60 mL/min (ref >60)
Glucose, Bld: 115 mg/dL — ABNORMAL HIGH (ref 70–99)
Potassium: 4.2 mmol/L (ref 3.5–5.1)
Sodium: 141 mmol/L (ref 135–145)

## 2020-10-24 LAB — CBC
HCT: 43.4 % (ref 39.0–52.0)
Hemoglobin: 15.5 g/dL (ref 13.0–17.0)
MCH: 34.4 pg — ABNORMAL HIGH (ref 26.0–34.0)
MCHC: 35.7 g/dL (ref 30.0–36.0)
MCV: 96.4 fL (ref 80.0–100.0)
Platelets: 174 10*3/uL (ref 150–400)
RBC: 4.5 MIL/uL (ref 4.22–5.81)
RDW: 12.9 % (ref 11.5–15.5)
WBC: 7.7 10*3/uL (ref 4.0–10.5)
nRBC: 0 % (ref 0.0–0.2)

## 2020-10-30 ENCOUNTER — Encounter
Admission: RE | Disposition: A | Discharge status: Home / Self Care | Payer: Self-pay | Source: Home / Self Care | Attending: Cardiovascular Disease

## 2020-10-30 ENCOUNTER — Ambulatory Visit
Admission: RE | Admit: 2020-10-30 | Discharge: 2020-10-30 | Disposition: A | Discharge status: Home / Self Care | Payer: BC Managed Care – PPO | Attending: Cardiovascular Disease | Admitting: Cardiovascular Disease

## 2020-10-30 ENCOUNTER — Telehealth: Payer: Self-pay | Admitting: Nurse Practitioner

## 2020-10-30 DIAGNOSIS — I200 Unstable angina: Secondary | ICD-10-CM

## 2020-10-30 DIAGNOSIS — I2510 Atherosclerotic heart disease of native coronary artery without angina pectoris: Secondary | ICD-10-CM

## 2020-10-30 SURGERY — LEFT HEART CATH AND CORONARY ANGIOGRAPHY
Anesthesia: Moderate Sedation

## 2020-10-30 NOTE — H&P (Signed)
The patient came for an elective left heart catheterization.  However, due to an emergent case, his procedure was postponed.  I gave him the option of starting later around lunchtime versus rescheduling and he preferred to reschedule to a later date.  Our office will get in touch with him.  He reported having stable anginal symptoms only with overexertion.

## 2020-10-30 NOTE — Telephone Encounter (Signed)
Patient calling to reschedule cath cancelled today due to provider emergency

## 2020-10-30 NOTE — Telephone Encounter (Signed)
Spoke with patient and reviewed that we rescheduled his heart cath for Friday August 5th with arrival time of 10:00 am. He verbalized understanding and also sending instructions with updated information to his My Chart. He was appreciate for the call back with no further questions at this time.

## 2020-10-30 NOTE — Telephone Encounter (Signed)
Spoke with patient and he stated that there was emergency and Dr. Fletcher Anon spoke with him and that he could be rescheduled for Friday. Advised I would call and follow up on rescheduling.

## 2020-10-31 ENCOUNTER — Ambulatory Visit: Payer: BC Managed Care – PPO | Admitting: Internal Medicine

## 2020-11-02 ENCOUNTER — Ambulatory Visit: Payer: BC Managed Care – PPO | Admitting: Nurse Practitioner

## 2020-11-03 ENCOUNTER — Encounter (HOSPITAL_COMMUNITY): Payer: Self-pay | Admitting: Cardiovascular Disease

## 2020-11-03 ENCOUNTER — Inpatient Hospital Stay
Admission: RE | Admit: 2020-11-03 | Discharge: 2020-11-03 | DRG: 287 | Disposition: A | Discharge status: Other Acute Inpatient Hospital | Payer: BC Managed Care – PPO | Attending: Cardiovascular Disease | Admitting: Cardiovascular Disease

## 2020-11-03 ENCOUNTER — Encounter
Admission: RE | Disposition: A | Discharge status: Other Acute Inpatient Hospital | Payer: BC Managed Care – PPO | Source: Home / Self Care | Attending: Cardiovascular Disease

## 2020-11-03 ENCOUNTER — Other Ambulatory Visit: Payer: Self-pay

## 2020-11-03 ENCOUNTER — Inpatient Hospital Stay (HOSPITAL_COMMUNITY)
Admission: AD | Admit: 2020-11-03 | Discharge: 2020-11-10 | DRG: 236 | Disposition: A | Discharge status: Home / Self Care | Payer: BC Managed Care – PPO | Source: Other Acute Inpatient Hospital | Attending: Thoracic Surgery (Cardiothoracic Vascular Surgery) | Admitting: Thoracic Surgery (Cardiothoracic Vascular Surgery)

## 2020-11-03 DIAGNOSIS — Z6831 Body mass index (BMI) 31.0-31.9, adult: Secondary | ICD-10-CM | POA: Diagnosis not present

## 2020-11-03 DIAGNOSIS — Z886 Allergy status to analgesic agent status: Secondary | ICD-10-CM

## 2020-11-03 DIAGNOSIS — Z79899 Other long term (current) drug therapy: Secondary | ICD-10-CM

## 2020-11-03 DIAGNOSIS — I248 Other forms of acute ischemic heart disease: Secondary | ICD-10-CM | POA: Diagnosis not present

## 2020-11-03 DIAGNOSIS — I2582 Chronic total occlusion of coronary artery: Secondary | ICD-10-CM | POA: Diagnosis present

## 2020-11-03 DIAGNOSIS — J90 Pleural effusion, not elsewhere classified: Secondary | ICD-10-CM

## 2020-11-03 DIAGNOSIS — E8770 Fluid overload, unspecified: Secondary | ICD-10-CM | POA: Diagnosis not present

## 2020-11-03 DIAGNOSIS — Z20822 Contact with and (suspected) exposure to covid-19: Secondary | ICD-10-CM | POA: Diagnosis present

## 2020-11-03 DIAGNOSIS — Z96611 Presence of right artificial shoulder joint: Secondary | ICD-10-CM | POA: Diagnosis present

## 2020-11-03 DIAGNOSIS — D696 Thrombocytopenia, unspecified: Secondary | ICD-10-CM | POA: Diagnosis present

## 2020-11-03 DIAGNOSIS — I700 Atherosclerosis of aorta: Secondary | ICD-10-CM | POA: Diagnosis present

## 2020-11-03 DIAGNOSIS — Z882 Allergy status to sulfonamides status: Secondary | ICD-10-CM

## 2020-11-03 DIAGNOSIS — D62 Acute posthemorrhagic anemia: Secondary | ICD-10-CM | POA: Diagnosis not present

## 2020-11-03 DIAGNOSIS — I2510 Atherosclerotic heart disease of native coronary artery without angina pectoris: Secondary | ICD-10-CM

## 2020-11-03 DIAGNOSIS — Z7982 Long term (current) use of aspirin: Secondary | ICD-10-CM | POA: Diagnosis not present

## 2020-11-03 DIAGNOSIS — F419 Anxiety disorder, unspecified: Secondary | ICD-10-CM | POA: Diagnosis not present

## 2020-11-03 DIAGNOSIS — J939 Pneumothorax, unspecified: Secondary | ICD-10-CM

## 2020-11-03 DIAGNOSIS — G4733 Obstructive sleep apnea (adult) (pediatric): Secondary | ICD-10-CM | POA: Diagnosis present

## 2020-11-03 DIAGNOSIS — I517 Cardiomegaly: Secondary | ICD-10-CM | POA: Diagnosis not present

## 2020-11-03 DIAGNOSIS — E119 Type 2 diabetes mellitus without complications: Secondary | ICD-10-CM | POA: Diagnosis present

## 2020-11-03 DIAGNOSIS — I10 Essential (primary) hypertension: Secondary | ICD-10-CM | POA: Diagnosis present

## 2020-11-03 DIAGNOSIS — Z825 Family history of asthma and other chronic lower respiratory diseases: Secondary | ICD-10-CM

## 2020-11-03 DIAGNOSIS — I25110 Atherosclerotic heart disease of native coronary artery with unstable angina pectoris: Principal | ICD-10-CM | POA: Diagnosis present

## 2020-11-03 DIAGNOSIS — I200 Unstable angina: Secondary | ICD-10-CM | POA: Diagnosis present

## 2020-11-03 DIAGNOSIS — R0902 Hypoxemia: Secondary | ICD-10-CM | POA: Diagnosis not present

## 2020-11-03 DIAGNOSIS — Z888 Allergy status to other drugs, medicaments and biological substances status: Secondary | ICD-10-CM | POA: Diagnosis not present

## 2020-11-03 DIAGNOSIS — R079 Chest pain, unspecified: Secondary | ICD-10-CM | POA: Diagnosis present

## 2020-11-03 DIAGNOSIS — I493 Ventricular premature depolarization: Secondary | ICD-10-CM | POA: Diagnosis present

## 2020-11-03 DIAGNOSIS — Z87891 Personal history of nicotine dependence: Secondary | ICD-10-CM

## 2020-11-03 DIAGNOSIS — I6523 Occlusion and stenosis of bilateral carotid arteries: Secondary | ICD-10-CM | POA: Diagnosis present

## 2020-11-03 DIAGNOSIS — Z01818 Encounter for other preprocedural examination: Secondary | ICD-10-CM | POA: Diagnosis not present

## 2020-11-03 DIAGNOSIS — Z7951 Long term (current) use of inhaled steroids: Secondary | ICD-10-CM

## 2020-11-03 DIAGNOSIS — Z951 Presence of aortocoronary bypass graft: Secondary | ICD-10-CM

## 2020-11-03 DIAGNOSIS — Q250 Patent ductus arteriosus: Secondary | ICD-10-CM

## 2020-11-03 DIAGNOSIS — E669 Obesity, unspecified: Secondary | ICD-10-CM | POA: Diagnosis not present

## 2020-11-03 DIAGNOSIS — I2584 Coronary atherosclerosis due to calcified coronary lesion: Secondary | ICD-10-CM | POA: Diagnosis present

## 2020-11-03 DIAGNOSIS — J449 Chronic obstructive pulmonary disease, unspecified: Secondary | ICD-10-CM | POA: Diagnosis present

## 2020-11-03 DIAGNOSIS — K219 Gastro-esophageal reflux disease without esophagitis: Secondary | ICD-10-CM | POA: Diagnosis not present

## 2020-11-03 DIAGNOSIS — E785 Hyperlipidemia, unspecified: Secondary | ICD-10-CM | POA: Diagnosis present

## 2020-11-03 DIAGNOSIS — I779 Disorder of arteries and arterioles, unspecified: Secondary | ICD-10-CM | POA: Diagnosis present

## 2020-11-03 DIAGNOSIS — J9811 Atelectasis: Secondary | ICD-10-CM | POA: Diagnosis not present

## 2020-11-03 DIAGNOSIS — J811 Chronic pulmonary edema: Secondary | ICD-10-CM | POA: Diagnosis not present

## 2020-11-03 DIAGNOSIS — E782 Mixed hyperlipidemia: Secondary | ICD-10-CM | POA: Diagnosis not present

## 2020-11-03 HISTORY — PX: LEFT HEART CATH AND CORONARY ANGIOGRAPHY: CATH118249

## 2020-11-03 LAB — GLUCOSE, CAPILLARY: Glucose-Capillary: 119 mg/dL — ABNORMAL HIGH (ref 70–99)

## 2020-11-03 SURGERY — LEFT HEART CATH AND CORONARY ANGIOGRAPHY
Anesthesia: Moderate Sedation

## 2020-11-03 MED ORDER — FLUTICASONE-UMECLIDIN-VILANT 100-62.5-25 MCG/INH IN AEPB: 1.0000 | INHALATION_SPRAY | Freq: Every day | RESPIRATORY_TRACT | Status: DC

## 2020-11-03 MED ORDER — LIDOCAINE HCL (PF) 1 % IJ SOLN
INTRAMUSCULAR | Status: DC | PRN
  Administered 2020-11-03: 2 mL

## 2020-11-03 MED ORDER — HEPARIN (PORCINE) IN NACL 1000-0.9 UT/500ML-% IV SOLN
INTRAVENOUS | Status: AC
  Filled 2020-11-03: qty 1000

## 2020-11-03 MED ORDER — FENTANYL CITRATE (PF) 100 MCG/2ML IJ SOLN
INTRAMUSCULAR | Status: DC | PRN
  Administered 2020-11-03: 25 ug via INTRAVENOUS

## 2020-11-03 MED ORDER — VERAPAMIL HCL 2.5 MG/ML IV SOLN
INTRAVENOUS | Status: DC | PRN
  Administered 2020-11-03: 2.5 mg via INTRA_ARTERIAL

## 2020-11-03 MED ORDER — SODIUM CHLORIDE 0.9% FLUSH: 3.0000 mL | INTRAVENOUS | Status: DC | PRN

## 2020-11-03 MED ORDER — HEPARIN SODIUM (PORCINE) 1000 UNIT/ML IJ SOLN
INTRAMUSCULAR | Status: DC | PRN
  Administered 2020-11-03: 5000 [IU] via INTRAVENOUS

## 2020-11-03 MED ORDER — IOHEXOL 300 MG/ML  SOLN
INTRAMUSCULAR | Status: DC | PRN
  Administered 2020-11-03: 90 mL

## 2020-11-03 MED ORDER — ADULT MULTIVITAMIN W/MINERALS CH
1.0000 | ORAL_TABLET | Freq: Every day | ORAL | Status: DC
  Administered 2020-11-03 – 2020-11-04 (×2): 1 via ORAL
  Filled 2020-11-03 (×2): qty 1

## 2020-11-03 MED ORDER — ATORVASTATIN CALCIUM 40 MG PO TABS: 40.0000 mg | ORAL_TABLET | Freq: Every day | ORAL | Status: DC

## 2020-11-03 MED ORDER — ASPIRIN EC 81 MG PO TBEC
81.0000 mg | DELAYED_RELEASE_TABLET | Freq: Every day | ORAL | Status: DC
  Administered 2020-11-04: 81 mg via ORAL
  Filled 2020-11-03: qty 1

## 2020-11-03 MED ORDER — AMLODIPINE BESYLATE 5 MG PO TABS: 5.0000 mg | ORAL_TABLET | Freq: Every day | ORAL | Status: DC

## 2020-11-03 MED ORDER — HEPARIN (PORCINE) 25000 UT/250ML-% IV SOLN
1500.0000 [IU]/h | INTRAVENOUS | Status: DC
  Administered 2020-11-03: 1250 [IU]/h via INTRAVENOUS
  Administered 2020-11-04: 1500 [IU]/h via INTRAVENOUS
  Filled 2020-11-03 (×2): qty 250

## 2020-11-03 MED ORDER — LIDOCAINE HCL (PF) 1 % IJ SOLN
INTRAMUSCULAR | Status: AC
  Filled 2020-11-03: qty 30

## 2020-11-03 MED ORDER — ALBUTEROL SULFATE HFA 108 (90 BASE) MCG/ACT IN AERS: 2.0000 | INHALATION_SPRAY | Freq: Four times a day (QID) | RESPIRATORY_TRACT | Status: DC | PRN

## 2020-11-03 MED ORDER — ACETAMINOPHEN 325 MG PO TABS: 650.0000 mg | ORAL_TABLET | ORAL | Status: DC | PRN

## 2020-11-03 MED ORDER — SERTRALINE HCL 50 MG PO TABS
50.0000 mg | ORAL_TABLET | Freq: Every day | ORAL | Status: DC
  Administered 2020-11-04: 50 mg via ORAL
  Filled 2020-11-03: qty 1

## 2020-11-03 MED ORDER — ASPIRIN 81 MG PO CHEW
81.0000 mg | CHEWABLE_TABLET | ORAL | Status: AC
  Administered 2020-11-03: 81 mg via ORAL

## 2020-11-03 MED ORDER — METOPROLOL TARTRATE 50 MG PO TABS
50.0000 mg | ORAL_TABLET | Freq: Two times a day (BID) | ORAL | Status: DC
Start: 2020-11-03 — End: 2020-11-05
  Administered 2020-11-03 – 2020-11-04 (×3): 50 mg via ORAL
  Filled 2020-11-03 (×3): qty 1

## 2020-11-03 MED ORDER — MIDAZOLAM HCL 2 MG/2ML IJ SOLN
INTRAMUSCULAR | Status: DC | PRN
  Administered 2020-11-03: 1 mg via INTRAVENOUS

## 2020-11-03 MED ORDER — SODIUM CHLORIDE 0.9 % WEIGHT BASED INFUSION: 1.0000 mL/kg/h | INTRAVENOUS | Status: DC

## 2020-11-03 MED ORDER — B-12 3000 MCG SL SUBL: 3000.0000 ug | SUBLINGUAL_TABLET | Freq: Every day | SUBLINGUAL | Status: DC

## 2020-11-03 MED ORDER — HEPARIN SODIUM (PORCINE) 1000 UNIT/ML IJ SOLN
INTRAMUSCULAR | Status: AC
  Filled 2020-11-03: qty 1

## 2020-11-03 MED ORDER — FENTANYL CITRATE (PF) 100 MCG/2ML IJ SOLN
INTRAMUSCULAR | Status: AC
  Filled 2020-11-03: qty 2

## 2020-11-03 MED ORDER — ASPIRIN 81 MG PO CHEW
CHEWABLE_TABLET | ORAL | Status: AC
  Filled 2020-11-03: qty 1

## 2020-11-03 MED ORDER — UMECLIDINIUM BROMIDE 62.5 MCG/INH IN AEPB
1.0000 | INHALATION_SPRAY | Freq: Every day | RESPIRATORY_TRACT | Status: DC
  Administered 2020-11-04: 1 via RESPIRATORY_TRACT
  Filled 2020-11-03: qty 7

## 2020-11-03 MED ORDER — NITROGLYCERIN 0.4 MG SL SUBL: 0.4000 mg | SUBLINGUAL_TABLET | SUBLINGUAL | Status: DC | PRN

## 2020-11-03 MED ORDER — MIDAZOLAM HCL 2 MG/2ML IJ SOLN
INTRAMUSCULAR | Status: AC
  Filled 2020-11-03: qty 2

## 2020-11-03 MED ORDER — SODIUM CHLORIDE 0.9% FLUSH: 3.0000 mL | Freq: Two times a day (BID) | INTRAVENOUS | Status: DC

## 2020-11-03 MED ORDER — SERTRALINE HCL 50 MG PO TABS: 50.0000 mg | ORAL_TABLET | Freq: Every day | ORAL | Status: DC

## 2020-11-03 MED ORDER — AMLODIPINE BESYLATE 5 MG PO TABS
5.0000 mg | ORAL_TABLET | Freq: Every day | ORAL | Status: DC
Start: 2020-11-04 — End: 2020-11-05
  Administered 2020-11-04: 5 mg via ORAL
  Filled 2020-11-03: qty 1

## 2020-11-03 MED ORDER — HEPARIN (PORCINE) IN NACL 1000-0.9 UT/500ML-% IV SOLN
INTRAVENOUS | Status: DC | PRN
Start: 2020-11-03 — End: 2020-11-03
  Administered 2020-11-03 (×2): 500 mL

## 2020-11-03 MED ORDER — ALBUTEROL SULFATE (2.5 MG/3ML) 0.083% IN NEBU: 3.0000 mL | INHALATION_SOLUTION | Freq: Four times a day (QID) | RESPIRATORY_TRACT | Status: DC | PRN

## 2020-11-03 MED ORDER — SODIUM CHLORIDE 0.9 % IV SOLN: 250.0000 mL | INTRAVENOUS | Status: DC | PRN

## 2020-11-03 MED ORDER — ASPIRIN 81 MG PO CHEW: 81.0000 mg | CHEWABLE_TABLET | Freq: Every day | ORAL | Status: DC

## 2020-11-03 MED ORDER — FLUTICASONE FUROATE-VILANTEROL 100-25 MCG/INH IN AEPB
1.0000 | INHALATION_SPRAY | Freq: Every day | RESPIRATORY_TRACT | Status: DC
  Administered 2020-11-04: 1 via RESPIRATORY_TRACT
  Filled 2020-11-03: qty 28

## 2020-11-03 MED ORDER — METOPROLOL TARTRATE 50 MG PO TABS: 50.0000 mg | ORAL_TABLET | Freq: Two times a day (BID) | ORAL | Status: DC

## 2020-11-03 MED ORDER — ONDANSETRON HCL 4 MG/2ML IJ SOLN: 4.0000 mg | Freq: Four times a day (QID) | INTRAMUSCULAR | Status: DC | PRN

## 2020-11-03 MED ORDER — SODIUM CHLORIDE 0.9 % WEIGHT BASED INFUSION
3.0000 mL/kg/h | INTRAVENOUS | Status: DC
  Administered 2020-11-03: 3 mL/kg/h via INTRAVENOUS

## 2020-11-03 MED ORDER — VERAPAMIL HCL 2.5 MG/ML IV SOLN
INTRAVENOUS | Status: AC
  Filled 2020-11-03: qty 2

## 2020-11-03 MED ORDER — SODIUM CHLORIDE 0.9 % IV SOLN: INTRAVENOUS | Status: DC

## 2020-11-03 MED ORDER — MULTIVITAMINS PO CAPS: 1.0000 | ORAL_CAPSULE | Freq: Every day | ORAL | Status: DC

## 2020-11-03 SURGICAL SUPPLY — 11 items
CATH INFINITI 5 FR JL3.5 (CATHETERS) ×2 IMPLANT
CATH INFINITI 5FR JK (CATHETERS) ×2 IMPLANT
DEVICE RAD TR BAND REGULAR (VASCULAR PRODUCTS) ×2 IMPLANT
DRAPE BRACHIAL (DRAPES) ×2 IMPLANT
GLIDESHEATH SLEND SS 6F .021 (SHEATH) ×2 IMPLANT
GUIDEWIRE INQWIRE 1.5J.035X260 (WIRE) ×1 IMPLANT
INQWIRE 1.5J .035X260CM (WIRE) ×2
PACK CARDIAC CATH (CUSTOM PROCEDURE TRAY) ×2 IMPLANT
PROTECTION STATION PRESSURIZED (MISCELLANEOUS) ×2
SET ATX SIMPLICITY (MISCELLANEOUS) ×2 IMPLANT
STATION PROTECTION PRESSURIZED (MISCELLANEOUS) ×1 IMPLANT

## 2020-11-03 NOTE — H&P (Signed)
Cardiology Admission History and Physical:   Patient ID: Soroush Dai MRN: [id number]; DOB: 06-15-1955   Admission date: (Not on file)  PCP:  Libby Maw, MD   [facility] Providers Cardiologist:  [name], MD   {    Chief Complaint: Exertional angina and dyspnea  Patient Profile:   Veryl Ahlstrand is a 65 y.o. male with CAD, HTN, HLD, COPD, carotid artery disease, prior tobacco use quitting in 2005, and sleep apnea who is being seen 11/03/2020 for the evaluation of severe CAD following diagnostic cath in the context of exertional dyspnea and angina.  History of Present Illness:   Mr. Vaziri underwent diagnostic cath in 2003 showed an occluded OM1 with collaterals, as well as a 60 to 70% proximal RCA stenosis.  EF was normal.  He was medically managed.  He has a prolonged history of stable angina with mild chest pain with overexertion.  Stress testing in 12/2015 showed no evidence of ischemia or infarct.  Escalation of antianginal therapy with long-acting nitrates has been prohibited secondary to headaches.  Echo in 01/2016 and again in 03/2018 showed normal LV systolic function without significant valvular abnormalities.  Repeat stress testing in 04/2020 was performed in the setting of progressive angina and showed a small area of mild ischemia in the inferolateral and apical lateral segments.  EF was greater than 65%.  CT imaging showed coronary artery calcification and aortic atherosclerosis.  He was also noted to have bilateral groundglass opacities with more focal consolidation versus scarring in the left lower lobe.  Recommendation was made for dedicated CT chest, if clinically appropriate.   He was seen and 08/2020 reporting exertional discomfort occurring 2-3 times per week with less provocation than previously noted with associated dyspnea lasting up to about 10 minutes and resolving with rest.  He deferred LHC at that time as he wanted to check his schedule.  He  was most recently seen on 10/13/2020 and continued to note exertional chest discomfort and dyspnea.  In this setting he underwent diagnostic LHC on 11/03/2020 at [facility] which demonstrated severe ostial left main stenosis and subtotal occlusion of the RCA with left-to-right collaterals.  Chronically occluded OM1 was noted.  There was significant pressure dampening with catheter engagement of the left main.  The ostial left main stenosis was eccentric and seemed to be due to a calcified plaque from the aorta with a slitlike origin.  Normal LV systolic function and mildly elevated LVEDP were noted.  It was recommended the patient be transferred to [facility] for consideration of CABG.  Given he has been having angina with any level of exertion his symptoms were unstable and with current coronary anatomy it was felt outpatient evaluation would be unsafe.  Interventional cardiology recommends initiating heparin drip at 6 PM.  We have discussed the case with flow manager for CVTS, who will see the patient inconsult at [facility].    Past Medical History:  Diagnosis Date   Allergy    Anxiety    Arthritis    right shoulder   CAD (coronary artery disease)    a. 2003 Cath: OM1 100%, good collaterals, RCA 60-70p-->med rx; b. 10/2007 MV: low risk, EF 60%; c. 12/2015 MV: No isch/infarct; d. 04/2020 MV: mild ischemia to sm area of mid inflat & apical lat segments. EF >65%.   Carotid artery disease ([place])    a. 03/2020 Carotid U/S: RICA 123456, LICA 123456.   COPD (chronic obstructive pulmonary disease) ([place])  Significant Dr. Lamonte Sakai   Diastolic dysfunction    a. 03/2018 Echo: EF 60-65%, no rwma, Gr1 DD. Mildly dil LA. Nl RV fxn.   Diverticulosis of colon    Ejection fraction    EF 60%, nuclear, 2009   GERD (gastroesophageal reflux disease)    Gout    Hyperlipidemia    Hypertension    Prostatitis    Sleep apnea    uses cpap nightly    Past Surgical History:  Procedure Laterality Date   COLONOSCOPY  12/2003    [place] - polyp   deviated septum     KNEE ARTHROSCOPY     right   TOTAL SHOULDER ARTHROPLASTY Right 02/03/2018   Procedure: RIGHT REVERSE SHOULDER ARTHROPLASTY;  Surgeon: Meredith Pel, MD;  Location: [place];  Service: Orthopedics;  Laterality: Right;   WISDOM TOOTH EXTRACTION       Medications Prior to Admission: Prior to Admission medications   Medication Sig Start Date End Date Taking? Authorizing Provider  albuterol (VENTOLIN HFA) 108 (90 Base) MCG/ACT inhaler TAKE 2 PUFFS BY MOUTH EVERY 6 HOURS AS NEEDED FOR WHEEZE OR SHORTNESS OF BREATH Patient taking differently: Inhale 2 puffs into the lungs every 6 (six) hours as needed for wheezing or shortness of breath. 09/05/20   Flora Lipps, MD  amLODipine (NORVASC) 5 MG tablet Take 1 tablet (5 mg total) by mouth daily. 09/05/20 12/04/20  Theora Gianotti, NP  atorvastatin (LIPITOR) 40 MG tablet TAKE 1 TABLET BY MOUTH EVERY DAY** Needs appointment vefore anymore refills** Patient taking differently: Take 40 mg by mouth daily. TAKE 1 TABLET BY MOUTH EVERY DAY** Needs appointment vefore anymore refills** 10/20/20   Libby Maw, MD  Cyanocobalamin (B-12) 3000 MCG SUBL Place 3,000 mcg under the tongue daily.    [provider]  metoprolol tartrate (LOPRESSOR) 50 MG tablet Take 1bid (Plz sched appt with new provider) Patient taking differently: Take 50 mg by mouth 2 (two) times daily. Take 1bid (Plz sched appt with new provider) 06/25/19   Ronnald Nian, DO  Multiple Vitamin (MULTIVITAMIN) capsule Take 1 capsule by mouth daily.    [provider]  nitroGLYCERIN (NITROSTAT) 0.4 MG SL tablet Place 0.4 mg under the tongue every 5 (five) minutes as needed for chest pain.    [provider]  sertraline (ZOLOFT) 50 MG tablet TAKE 1 TABLET BY MOUTH EVERY DAY Patient taking differently: Take 50 mg by mouth daily. 07/20/20   Libby Maw, MD  TRELEGY ELLIPTA 100-62.5-25 MCG/INH AEPB TAKE 1 PUFF BY  MOUTH EVERY DAY Patient taking differently: Inhale 1 puff into the lungs daily. 06/07/20   Flora Lipps, MD     Allergies:    Allergies  Allergen Reactions   Avelox [Moxifloxacin Hcl In Nacl] Other (See Comments)    Facial swelling   Sulfamethoxazole-Trimethoprim Other (See Comments)    REACTION: sore mouth   Imdur [Isosorbide Nitrate] Other (See Comments)    Made pt feel groggy, loopy, bad dreams    Aspirin Other (See Comments)    REACTION: Nausea; fine with '81mg'$     Social History:   Social History   Socioeconomic History   Marital status: Married    Spouse name: Not on file   Number of children: 0   Years of education: Not on file   Highest education level: Not on file  Occupational History   Occupation: Freight forwarder at [employer]    Employer: [employer]  Tobacco Use   Smoking status:  Former    Packs/day: 2.00    Years: 32.00    Pack years: 64.00    Types: Cigarettes    Quit date: 08/31/1999    Years since quitting: 21.1   Smokeless tobacco: Former  Scientific laboratory technician Use: Never used  Substance and Sexual Activity   Alcohol use: No    Alcohol/week: 0.0 standard drinks   Drug use: No   Sexual activity: Not on file  Other Topics Concern   Not on file  Social History Narrative   Not on file   Social Determinants of Health   Financial Resource Strain: Not on file  Food Insecurity: Not on file  Transportation Needs: Not on file  Physical Activity: Not on file  Stress: Not on file  Social Connections: Not on file  Intimate Partner Violence: Not on file    Family History:   The patient's family history includes Angina in his mother; Asthma in his father; Crohn's disease in his sister. There is no history of Colon cancer, Esophageal cancer, Rectal cancer, or Stomach cancer.    ROS:  Please see the history of present illness.  All other ROS reviewed and negative.     Physical Exam/Data:  BP 137/70, HR 52 bpm, temp 98.1 F, respirations 10, SPO2  93%, weight 103 kg Last 3 Weights 11/03/2020 10/13/2020 09/05/2020  Weight (lbs) 227 lb 224 lb 226 lb  Weight (kg) 102.967 kg 101.606 kg 102.513 kg      General:  Well nourished, well developed, in no acute distress HEENT: Normal Lymph: No adenopathy Neck: No JVD Endocrine:  No thryomegaly Vascular: No carotid bruits; FA pulses 2+ bilaterally without bruits  Cardiac:  Normal S1, S2; RRR; no murmur  Lungs:  Clear to auscultation bilaterally, no wheezing, rhonchi or rales  Abd: Soft, nontender, no hepatomegaly  Ext: No edema Musculoskeletal:  No deformities, BUE and BLE strength normal and equal Skin: Warm and dry  Neuro:  CNs 2-12 intact, no focal abnormalities noted Psych:  Normal affect    EKG:  The ECG that was done 10/13/2020 was personally reviewed and demonstrates sinus bradycardia, 54 bpm, no acute ST-T changes  Relevant CV Studies:  LHC 11/03/2020:   Prox RCA to Mid RCA lesion is 99% stenosed.   1st Mrg lesion is 100% stenosed.   Prox LAD to Mid LAD lesion is 30% stenosed.   Ost LM lesion is 85% stenosed.   The left ventricular systolic function is normal.   LV end diastolic pressure is mildly elevated.   The left ventricular ejection fraction is 55-65% by visual estimate.   1.  Severe ostial left main stenosis and subtotal occlusion of the right coronary artery with left-to-right collaterals.  Chronically occluded OM1.  Significant pressure dampening with catheter engagement of the left main.  The ostial left main stenosis is eccentric and seems to be due to a calcified plaque from the aorta with a slitlike origin. 2.  Normal LV systolic function and mildly elevated left ventricular end-diastolic pressure.   Recommendations: Recommend transfer to [facility] for evaluation of CABG.  The patient has been having chest pain with any level of exertion.  His symptoms are unstable and with current anatomy, I do not think it would be safe to do this as an outpatient. Recommend  starting heparin drip at 6 PM.   Diagnostic Dominance: Right  Left Main  Ost LM lesion is 85% stenosed. The lesion is eccentric. The lesion is moderately calcified.  Left Anterior Descending  Prox LAD to Mid LAD lesion is 30% stenosed.  Left Circumflex  First Obtuse Marginal Branch  1st Mrg lesion is 100% stenosed.  Right Coronary Artery  Prox RCA to Mid RCA lesion is 99% stenosed.  Right Posterior Descending Artery  Collaterals  RPDA filled by collaterals from Dist LAD.     Intervention   No interventions have been documented.         Wall Motion               Left Heart  Left Ventricle The left ventricular size is normal. The left ventricular systolic function is normal. LV end diastolic pressure is mildly elevated. The left ventricular ejection fraction is 55-65% by visual estimate. No regional wall motion abnormalities.    Coronary Diagrams   Diagnostic Dominance: Right     __________  Carotid artery ultrasound 03/2020: Summary:  Right Carotid: Velocities in the right ICA are consistent with a 1-39%  stenosis.                 Non-hemodynamically significant plaque <50% noted in the  CCA. The                 ECA appears <50% stenosed.   Left Carotid: Velocities in the left ICA are consistent with a 40-59%  stenosis.                Hemodynamically significant plaque >50% visualized in the  CCA. The                ECA appears <50% stenosed.   Vertebrals:  Bilateral vertebral arteries demonstrate antegrade flow.  Subclavians: Normal flow hemodynamics were seen in bilateral subclavian arteries.  __________  2D echo 03/2018: - Left ventricle: The cavity size was normal. There was mild    concentric hypertrophy. Systolic function was normal. The    estimated ejection fraction was in the range of 60% to 65%. Wall    motion was normal; there were no regional wall motion    abnormalities. Doppler parameters are consistent with abnormal     left ventricular relaxation (grade 1 diastolic dysfunction).  - Aortic valve: Transvalvular velocity was within the normal range.    There was no stenosis.  - Left atrium: The atrium was mildly dilated.  - Right ventricle: Systolic function was normal.  - Pulmonary arteries: Systolic pressure was within the normal    range.     Laboratory Data:  High Sensitivity Troponin:  No results for input(s): TROPONINIHS in the last 720 hours.    ChemistryNo results for input(s): NA, K, CL, CO2, GLUCOSE, BUN, CREATININE, CALCIUM, GFRNONAA, GFRAA, ANIONGAP in the last 168 hours.  No results for input(s): PROT, ALBUMIN, AST, ALT, ALKPHOS, BILITOT in the last 168 hours. HematologyNo results for input(s): WBC, RBC, HGB, HCT, MCV, MCH, MCHC, RDW, PLT in the last 168 hours. BNPNo results for input(s): BNP, PROBNP in the last 168 hours.  DDimer No results for input(s): DDIMER in the last 168 hours.   Radiology/Studies:   None   Assessment and Plan:   CAD involving the native coronary arteries with exertional angina and dyspnea: Currently chest pain-free, though does note progressive exertional angina and dyspnea with minimal activity.  Diagnostic LHC at [facility] on 8/5 demonstrated severe ostial left main stenosis with subtotally occluded RCA as outlined above.  Plan is to transfer to [facility] for evaluation by CVTS for CABG. start heparin drip at 6 PM on  11/03/2020.  He will otherwise continue ASA 81 mg, Lopressor 50 mg twice daily, atorvastatin 40 mg, amlodipine 5 mg and as needed SL NTG.  Obtain echo.  Aggressive resector modification.  Cardiac rehab.  HTN: Blood pressure has been reasonably controlled at [facility].  He remains on metoprolol amlodipine.  With sinus bradycardia noted, may need to de-escalate metoprolol dose based on trend.  For now, will remain at current dose given stable BP and for antianginal effect.  HLD: LDL 65 in 12/2019 with normal LFT's at that time.  He remains on  atorvastatin.  Carotid artery disease: Ultrasound in 03/2020 demonstrated stable 1 to 39% RICA stenosis and 40 to [id number] LICA stenosis with plans for follow-up ultrasound in 03/2021.  He remains on aspirin and statin therapy.  COPD: Stable.  PTA Trelegy and as needed albuterol.  OSA: CPAP.  Anxiety: PTA Zoloft.   Risk Assessment/Risk Scores:   TIMI Risk Score for Unstable Angina or Non-ST Elevation MI:   The patient's TIMI risk score is 4, which indicates a 20% risk of all cause mortality, new or recurrent myocardial infarction or need for urgent revascularization in the next 14 days.{   Severity of Illness: The appropriate patient status for this patient is INPATIENT. Inpatient status is judged to be reasonable and necessary in order to provide the required intensity of service to ensure the patient's safety. The patient's presenting symptoms, physical exam findings, and initial radiographic and laboratory data in the context of their chronic comorbidities is felt to place them at high risk for further clinical deterioration. Furthermore, it is not anticipated that the patient will be medically stable for discharge from the hospital within 2 midnights of admission. The following factors support the patient status of inpatient.   " The patient's presenting symptoms include progressive exertional angina and dyspnea. " The worrisome physical exam findings include as above. " The initial radiographic and laboratory data are worrisome because of severe CAD as outlined above. " The chronic co-morbidities include HTN, HLD, carotid artery disease, and OSA.   * I certify that at the point of admission it is my clinical judgment that the patient will require inpatient hospital care spanning beyond 2 midnights from the point of admission due to high intensity of service, high risk for further deterioration and high frequency of surveillance required.*   For questions or updates, please contact [place] Please consult [email or web address] for contact info under     Signed, Christell Faith, PA-C  11/03/2020 1:44 PM

## 2020-11-03 NOTE — Progress Notes (Signed)
Gave report to April from [facility]. States they should be here in about 25 minutes.

## 2020-11-03 NOTE — Progress Notes (Signed)
[place] here to pick up patient. Moved to gurney without complication. Left with [facility] team. Wife here with patient.

## 2020-11-03 NOTE — Discharge Summary (Signed)
65 year old male with known history of coronary artery disease, essential hypertension, hyperlipidemia, previous tobacco use and mild COPD who was referred for outpatient cardiac catheterization given progressive anginal symptoms currently happening with any kind of activities. Cardiac catheterization was performed via the right radial artery and showed subtotally occluded right coronary artery as well as significant ostial left main stenosis.  Ejection fraction was normal with mildly elevated left ventricular end-diastolic pressure. Based on his symptoms and coronary anatomy, I recommended transfer to [facility] for CABG evaluation.  I discussed the plan with the patient and his wife.

## 2020-11-03 NOTE — Progress Notes (Signed)
Called report to nurse Nino Glow on 6E. Patient assigned to room 29. No ETA for [facility] at this time.

## 2020-11-03 NOTE — Progress Notes (Signed)
ANTICOAGULATION CONSULT NOTE - Initial Consult  Pharmacy Consult for heparin Indication: chest pain/ACS s/p LHC for CABG   Allergies  Allergen Reactions   Avelox [Moxifloxacin Hcl In Nacl] Other (See Comments)    Facial swelling   Sulfamethoxazole-Trimethoprim Other (See Comments)    REACTION: sore mouth   Imdur [Isosorbide Nitrate] Other (See Comments)    Made pt feel groggy, loopy, bad dreams    Aspirin Other (See Comments)    REACTION: Nausea; fine with '81mg'$     Patient Measurements: Height: '5\' 11"'$  (180.3 cm) Weight: 99.1 kg (218 lb 8 oz) IBW/kg (Calculated) : 75.3 Heparin Dosing Weight: 95 kg   Vital Signs: Temp: 98.3 F (36.8 C) (08/05 1647) Temp Source: Oral (08/05 1647) BP: 157/81 (08/05 1647) Pulse Rate: 56 (08/05 1647)  Labs: No results for input(s): HGB, HCT, PLT, APTT, LABPROT, INR, HEPARINUNFRC, HEPRLOWMOCWT, CREATININE, CKTOTAL, CKMB, TROPONINIHS in the last 72 hours.  Estimated Creatinine Clearance: 81.4 mL/min (by C-G formula based on SCr of 1.1 mg/dL).   Medical History: Past Medical History:  Diagnosis Date   Allergy    Anxiety    Arthritis    right shoulder   CAD (coronary artery disease)    a. 2003 Cath: OM1 100%, good collaterals, RCA 60-70p-->med rx; b. 10/2007 MV: low risk, EF 60%; c. 12/2015 MV: No isch/infarct; d. 04/2020 MV: mild ischemia to sm area of mid inflat & apical lat segments. EF >65%.   Carotid artery disease ([place])    a. 03/2020 Carotid U/S: RICA 123456, LICA 123456.   COPD (chronic obstructive pulmonary disease) ([place])    Significant Dr. Lamonte Sakai   Diastolic dysfunction    a. 03/2018 Echo: EF 60-65%, no rwma, Gr1 DD. Mildly dil LA. Nl RV fxn.   Diverticulosis of colon    Ejection fraction    EF 60%, nuclear, 2009   GERD (gastroesophageal reflux disease)    Gout    Hyperlipidemia    Hypertension    Prostatitis    Sleep apnea    uses cpap nightly    Medications:  Medications Prior to Admission  Medication Sig Dispense Refill  Last Dose   albuterol (VENTOLIN HFA) 108 (90 Base) MCG/ACT inhaler TAKE 2 PUFFS BY MOUTH EVERY 6 HOURS AS NEEDED FOR WHEEZE OR SHORTNESS OF BREATH (Patient taking differently: Inhale 2 puffs into the lungs every 6 (six) hours as needed for wheezing or shortness of breath.) 6.7 each 10 11/02/2020   amLODipine (NORVASC) 5 MG tablet Take 1 tablet (5 mg total) by mouth daily. 90 tablet 3 11/03/2020   atorvastatin (LIPITOR) 40 MG tablet TAKE 1 TABLET BY MOUTH EVERY DAY** Needs appointment vefore anymore refills** (Patient taking differently: Take 40 mg by mouth daily.) 90 tablet 0 11/03/2020   Cyanocobalamin (B-12) 3000 MCG SUBL Place 3,000 mcg under the tongue daily.   11/03/2020   metoprolol tartrate (LOPRESSOR) 50 MG tablet Take 1bid (Plz sched appt with new provider) (Patient taking differently: Take 50 mg by mouth 2 (two) times daily. Take 1bid (Plz sched appt with new provider)) 180 tablet 0 11/03/2020 at 0800   Multiple Vitamin (MULTIVITAMIN) capsule Take 1 capsule by mouth daily.   11/02/2020   nitroGLYCERIN (NITROSTAT) 0.4 MG SL tablet Place 0.4 mg under the tongue every 5 (five) minutes as needed for chest pain.   unknown at unknown   sertraline (ZOLOFT) 50 MG tablet TAKE 1 TABLET BY MOUTH EVERY DAY (Patient taking differently: Take 50 mg by mouth daily.) 90 tablet 1 11/03/2020  TRELEGY ELLIPTA 100-62.5-25 MCG/INH AEPB TAKE 1 PUFF BY MOUTH EVERY DAY (Patient taking differently: Inhale 1 puff into the lungs daily.) 180 each 2 11/03/2020    Assessment: 25 YOM who presented with exertional angina and dypsnea and underwent a LHC at [facility] today demonstrated severe left main stenosis and subtotally occluded RCA. Transferred to [facility] for CABG eval. Pharmacy consulted to start IV heparin this evening.   H/H and Plt wnl. SCr wnl   Goal of Therapy:  Heparin level 0.3-0.7 units/ml Monitor platelets by anticoagulation protocol: Yes   Plan:  -Start heparin infusion at 1250 units/hr -F/u 6 hr HL -Monitor daily HL, CBC  and s/s of bleeding   Albertina Parr, PharmD., BCPS, BCCCP Clinical Pharmacist Please refer to [facility] for unit-specific pharmacist

## 2020-11-03 NOTE — Interval H&P Note (Signed)
Cath Lab Visit (complete for each Cath Lab visit)  Clinical Evaluation Leading to the Procedure:   ACS: No.  Non-ACS:    Anginal Classification: CCS III  Anti-ischemic medical therapy: Maximal Therapy (2 or more classes of medications)  Non-Invasive Test Results: Intermediate-risk stress test findings: cardiac mortality 1-3%/year  Prior CABG: No previous CABG      History and Physical Interval Note:  11/03/2020 11:18 AM  Hayden Deleon  has presented today for surgery, with the diagnosis of LT Cath   CAD.  The various methods of treatment have been discussed with the patient and family. After consideration of risks, benefits and other options for treatment, the patient has consented to  Procedure(s): LEFT HEART CATH AND CORONARY ANGIOGRAPHY (N/A) as a surgical intervention.  The patient's history has been reviewed, patient examined, no change in status, stable for surgery.  I have reviewed the patient's chart and labs.  Questions were answered to the patient's satisfaction.     Hayden Deleon

## 2020-11-04 ENCOUNTER — Inpatient Hospital Stay (HOSPITAL_COMMUNITY): Payer: BC Managed Care – PPO

## 2020-11-04 ENCOUNTER — Other Ambulatory Visit (HOSPITAL_COMMUNITY): Payer: BC Managed Care – PPO

## 2020-11-04 DIAGNOSIS — E785 Hyperlipidemia, unspecified: Secondary | ICD-10-CM | POA: Diagnosis not present

## 2020-11-04 DIAGNOSIS — I25110 Atherosclerotic heart disease of native coronary artery with unstable angina pectoris: Secondary | ICD-10-CM | POA: Diagnosis not present

## 2020-11-04 DIAGNOSIS — I200 Unstable angina: Secondary | ICD-10-CM

## 2020-11-04 DIAGNOSIS — I248 Other forms of acute ischemic heart disease: Secondary | ICD-10-CM

## 2020-11-04 DIAGNOSIS — J449 Chronic obstructive pulmonary disease, unspecified: Secondary | ICD-10-CM | POA: Diagnosis not present

## 2020-11-04 DIAGNOSIS — I10 Essential (primary) hypertension: Secondary | ICD-10-CM | POA: Diagnosis not present

## 2020-11-04 LAB — MAGNESIUM: Magnesium: 2 mg/dL (ref 1.7–2.4)

## 2020-11-04 LAB — URINALYSIS, ROUTINE W REFLEX MICROSCOPIC
Bilirubin Urine: NEGATIVE
Glucose, UA: NEGATIVE mg/dL
Hgb urine dipstick: NEGATIVE
Ketones, ur: NEGATIVE mg/dL
Leukocytes,Ua: NEGATIVE
Nitrite: NEGATIVE
Protein, ur: NEGATIVE mg/dL
Specific Gravity, Urine: 1.008 (ref 1.005–1.030)
pH: 6 (ref 5.0–8.0)

## 2020-11-04 LAB — BLOOD GAS, ARTERIAL
Acid-base deficit: 0.9 mmol/L (ref 0.0–2.0)
Bicarbonate: 22.8 mmol/L (ref 20.0–28.0)
Drawn by: 24686
FIO2: 21
O2 Saturation: 91.7 %
Patient temperature: 37
pCO2 arterial: 34.7 mmHg (ref 32.0–48.0)
pH, Arterial: 7.433 (ref 7.350–7.450)
pO2, Arterial: 60.6 mmHg — ABNORMAL LOW (ref 83.0–108.0)

## 2020-11-04 LAB — COMPREHENSIVE METABOLIC PANEL
ALT: 19 U/L (ref 0–44)
AST: 17 U/L (ref 15–41)
Albumin: 3.3 g/dL — ABNORMAL LOW (ref 3.5–5.0)
Alkaline Phosphatase: 50 U/L (ref 38–126)
Anion gap: 6 (ref 5–15)
BUN: 8 mg/dL (ref 8–23)
CO2: 26 mmol/L (ref 22–32)
Calcium: 9.1 mg/dL (ref 8.9–10.3)
Chloride: 107 mmol/L (ref 98–111)
Creatinine, Ser: 0.98 mg/dL (ref 0.61–1.24)
GFR, Estimated: >60 mL/min (ref >60)
Glucose, Bld: 95 mg/dL (ref 70–99)
Potassium: 3.8 mmol/L (ref 3.5–5.1)
Sodium: 139 mmol/L (ref 135–145)
Total Bilirubin: 0.9 mg/dL (ref 0.3–1.2)
Total Protein: 6.1 g/dL — ABNORMAL LOW (ref 6.5–8.1)

## 2020-11-04 LAB — CBC
HCT: 43.5 % (ref 39.0–52.0)
Hemoglobin: 15.3 g/dL (ref 13.0–17.0)
MCH: 33.1 pg (ref 26.0–34.0)
MCHC: 35.2 g/dL (ref 30.0–36.0)
MCV: 94.2 fL (ref 80.0–100.0)
Platelets: 163 10*3/uL (ref 150–400)
RBC: 4.62 MIL/uL (ref 4.22–5.81)
RDW: 13 % (ref 11.5–15.5)
WBC: 8 10*3/uL (ref 4.0–10.5)
nRBC: 0 % (ref 0.0–0.2)

## 2020-11-04 LAB — HEPARIN LEVEL (UNFRACTIONATED)
Heparin Unfractionated: 0.19 IU/mL — ABNORMAL LOW (ref 0.30–0.70)
Heparin Unfractionated: 0.34 IU/mL (ref 0.30–0.70)

## 2020-11-04 LAB — LIPID PANEL
Cholesterol: 145 mg/dL (ref 0–200)
HDL: 38 mg/dL — ABNORMAL LOW (ref >40)
LDL Cholesterol: 86 mg/dL (ref 0–99)
Total CHOL/HDL Ratio: 3.8 RATIO
Triglycerides: 103 mg/dL (ref <150)
VLDL: 21 mg/dL (ref 0–40)

## 2020-11-04 LAB — PROTIME-INR
INR: 1 (ref 0.8–1.2)
Prothrombin Time: 13.5 seconds (ref 11.4–15.2)

## 2020-11-04 LAB — ECHOCARDIOGRAM COMPLETE
AR max vel: 2.41 cm2
AV Area VTI: 2.15 cm2
AV Area mean vel: 2.14 cm2
AV Mean grad: 8 mmHg
AV Peak grad: 12 mmHg
Ao pk vel: 1.73 m/s
Area-P 1/2: 3.42 cm2
MV VTI: 3.29 cm2
S' Lateral: 3 cm

## 2020-11-04 LAB — HIV ANTIBODY (ROUTINE TESTING W REFLEX): HIV Screen 4th Generation wRfx: NONREACTIVE

## 2020-11-04 LAB — APTT: aPTT: 51 seconds — ABNORMAL HIGH (ref 24–36)

## 2020-11-04 LAB — TYPE AND SCREEN
ABO/RH(D): A NEG
Antibody Screen: NEGATIVE

## 2020-11-04 LAB — SURGICAL PCR SCREEN
MRSA, PCR: NEGATIVE
Staphylococcus aureus: NEGATIVE

## 2020-11-04 LAB — HEMOGLOBIN A1C
Hgb A1c MFr Bld: 5.7 % — ABNORMAL HIGH (ref 4.8–5.6)
Mean Plasma Glucose: 116.89 mg/dL

## 2020-11-04 LAB — SARS CORONAVIRUS 2 (TAT 6-24 HRS): SARS Coronavirus 2: NEGATIVE

## 2020-11-04 MED ORDER — NITROGLYCERIN IN D5W 200-5 MCG/ML-% IV SOLN
2.0000 ug/min | INTRAVENOUS | Status: DC
  Filled 2020-11-04: qty 250

## 2020-11-04 MED ORDER — CHLORHEXIDINE GLUCONATE CLOTH 2 % EX PADS
6.0000 | MEDICATED_PAD | Freq: Once | CUTANEOUS | Status: AC
  Administered 2020-11-04: 6 via TOPICAL

## 2020-11-04 MED ORDER — SODIUM CHLORIDE 0.9 % IV SOLN
INTRAVENOUS | Status: DC
  Filled 2020-11-04: qty 30

## 2020-11-04 MED ORDER — VANCOMYCIN HCL 1500 MG/300ML IV SOLN
1500.0000 mg | INTRAVENOUS | Status: AC
  Administered 2020-11-05: 1500 mg via INTRAVENOUS
  Filled 2020-11-04: qty 300

## 2020-11-04 MED ORDER — POTASSIUM CHLORIDE 2 MEQ/ML IV SOLN
80.0000 meq | INTRAVENOUS | Status: DC
  Filled 2020-11-04: qty 40

## 2020-11-04 MED ORDER — METOPROLOL TARTRATE 12.5 MG HALF TABLET
12.5000 mg | ORAL_TABLET | Freq: Once | ORAL | Status: DC
  Filled 2020-11-04: qty 1

## 2020-11-04 MED ORDER — MANNITOL 20 % IV SOLN
Freq: Once | INTRAVENOUS | Status: DC
  Filled 2020-11-04: qty 13

## 2020-11-04 MED ORDER — CHLORHEXIDINE GLUCONATE 0.12 % MT SOLN
15.0000 mL | Freq: Once | OROMUCOSAL | Status: AC
  Administered 2020-11-05: 15 mL via OROMUCOSAL
  Filled 2020-11-04: qty 15

## 2020-11-04 MED ORDER — PERFLUTREN LIPID MICROSPHERE
1.0000 mL | INTRAVENOUS | Status: AC | PRN
  Administered 2020-11-04: 4 mL via INTRAVENOUS
  Filled 2020-11-04: qty 10

## 2020-11-04 MED ORDER — TRANEXAMIC ACID (OHS) BOLUS VIA INFUSION
15.0000 mg/kg | INTRAVENOUS | Status: AC
  Administered 2020-11-05: 1486.5 mg via INTRAVENOUS
  Filled 2020-11-04: qty 1487

## 2020-11-04 MED ORDER — MUPIROCIN 2 % EX OINT: 1.0000 "application " | TOPICAL_OINTMENT | Freq: Two times a day (BID) | CUTANEOUS | Status: DC

## 2020-11-04 MED ORDER — INSULIN REGULAR(HUMAN) IN NACL 100-0.9 UT/100ML-% IV SOLN
INTRAVENOUS | Status: AC
  Administered 2020-11-05: 1 [IU]/h via INTRAVENOUS
  Filled 2020-11-04: qty 100

## 2020-11-04 MED ORDER — CEFAZOLIN SODIUM-DEXTROSE 2-4 GM/100ML-% IV SOLN
2.0000 g | INTRAVENOUS | Status: DC
  Filled 2020-11-04: qty 100

## 2020-11-04 MED ORDER — EPINEPHRINE HCL 5 MG/250ML IV SOLN IN NS
0.0000 ug/min | INTRAVENOUS | Status: DC
  Filled 2020-11-04: qty 250

## 2020-11-04 MED ORDER — TRANEXAMIC ACID 1000 MG/10ML IV SOLN
1.5000 mg/kg/h | INTRAVENOUS | Status: AC
  Administered 2020-11-05: 1.5 mg/kg/h via INTRAVENOUS
  Filled 2020-11-04: qty 25

## 2020-11-04 MED ORDER — BISACODYL 5 MG PO TBEC: 5.0000 mg | DELAYED_RELEASE_TABLET | Freq: Once | ORAL | Status: DC

## 2020-11-04 MED ORDER — DEXMEDETOMIDINE HCL IN NACL 400 MCG/100ML IV SOLN
0.1000 ug/kg/h | INTRAVENOUS | Status: AC
  Administered 2020-11-05: .3 ug/kg/h via INTRAVENOUS
  Filled 2020-11-04: qty 100

## 2020-11-04 MED ORDER — TRANEXAMIC ACID (OHS) PUMP PRIME SOLUTION
2.0000 mg/kg | INTRAVENOUS | Status: DC
  Filled 2020-11-04: qty 1.98

## 2020-11-04 MED ORDER — PLASMA-LYTE A IV SOLN
INTRAVENOUS | Status: DC
  Filled 2020-11-04: qty 5

## 2020-11-04 MED ORDER — CHLORHEXIDINE GLUCONATE CLOTH 2 % EX PADS
6.0000 | MEDICATED_PAD | Freq: Once | CUTANEOUS | Status: AC
  Administered 2020-11-05: 6 via TOPICAL

## 2020-11-04 MED ORDER — MILRINONE LACTATE IN DEXTROSE 20-5 MG/100ML-% IV SOLN
0.3000 ug/kg/min | INTRAVENOUS | Status: DC
  Filled 2020-11-04: qty 100

## 2020-11-04 MED ORDER — PHENYLEPHRINE HCL-NACL 20-0.9 MG/250ML-% IV SOLN
30.0000 ug/min | INTRAVENOUS | Status: AC
  Administered 2020-11-05: 25 ug/min via INTRAVENOUS
  Filled 2020-11-04: qty 250

## 2020-11-04 MED ORDER — NOREPINEPHRINE 4 MG/250ML-% IV SOLN
0.0000 ug/min | INTRAVENOUS | Status: DC
  Filled 2020-11-04: qty 250

## 2020-11-04 MED ORDER — ATORVASTATIN CALCIUM 80 MG PO TABS
80.0000 mg | ORAL_TABLET | Freq: Every day | ORAL | Status: DC
  Administered 2020-11-04: 80 mg via ORAL
  Filled 2020-11-04: qty 1

## 2020-11-04 MED ORDER — TEMAZEPAM 15 MG PO CAPS: 15.0000 mg | ORAL_CAPSULE | Freq: Once | ORAL | Status: DC | PRN

## 2020-11-04 MED ORDER — CEFAZOLIN SODIUM-DEXTROSE 2-4 GM/100ML-% IV SOLN
2.0000 g | INTRAVENOUS | Status: AC
  Administered 2020-11-05 (×2): 2 g via INTRAVENOUS
  Filled 2020-11-04: qty 100

## 2020-11-04 NOTE — Progress Notes (Signed)
Pre CABG dopplers have not yet been done.  [place] tech notified.  She stated there is no vascular US tech at night and no one on call.  She stated that their shift starts at 7 am.  This was confirmed by Shanon Brow, the [facility].  Will notify OR staff in am.  Hayden Deleon

## 2020-11-04 NOTE — Progress Notes (Signed)
Notified by central telemetry that the patient's heart rate dropped to 39 with a 3.25 second pause.  This occurred while a Covid swab was being performed on the patient, and he was asymptomatic.  Will continue to monitor.  Jodell Cipro

## 2020-11-04 NOTE — Progress Notes (Signed)
ANTICOAGULATION CONSULT NOTE - Follow Up Consult  Pharmacy Consult for heparin Indication:  CAD awaiting CABG consult  Labs: Recent Labs    11/04/20 0320  HGB 15.3  HCT 43.5  PLT 163  HEPARINUNFRC 0.19*  CREATININE 0.98    Assessment: 64yo male subtherapeutic on heparin with initial dosing post-cath; no infusion issues or signs of bleeding per RN.  Goal of Therapy:  Heparin level 0.3-0.7 units/ml   Plan:  Will increase heparin infusion by 2-3 units/kg/hr to 1500 units/hr and check level in 6 hours.    Wynona Neat, PharmD, BCPS 11/04/2020 4:14 AM

## 2020-11-04 NOTE — Progress Notes (Signed)
Progress Note  Patient Name: Hayden Deleon Date of Encounter: 11/04/2020  [facility] Cardiologist: [name], MD   Subjective   No CP  NO SOB  Inpatient Medications    Scheduled Meds:  amLODipine  5 mg Oral Daily   aspirin EC  81 mg Oral Daily   atorvastatin  40 mg Oral Daily   fluticasone furoate-vilanterol  1 puff Inhalation Daily   metoprolol tartrate  50 mg Oral BID   multivitamin with minerals  1 tablet Oral Daily   mupirocin ointment  1 application Nasal BID   sertraline  50 mg Oral Daily   umeclidinium bromide  1 puff Inhalation Daily   Continuous Infusions:  heparin 1,500 Units/hr (11/04/20 0439)   PRN Meds: acetaminophen, albuterol, nitroGLYCERIN, ondansetron (ZOFRAN) IV   Vital Signs    Vitals:   11/03/20 2206 11/03/20 2309 11/04/20 0000 11/04/20 0446  BP:   (!) 152/73 (!) 147/74  Pulse: 60  (!) 51 (!) 52  Resp:   15 16  Temp:   97.7 F (36.5 C) 97.9 F (36.6 C)  TempSrc:   Axillary Oral  SpO2:  95% 94% 93%  Weight:      Height:        Intake/Output Summary (Last 24 hours) at 11/04/2020 0658 Last data filed at 11/03/2020 2351 Gross per 24 hour  Intake 525.4 ml  Output --  Net 525.4 ml   Last 3 Weights 11/03/2020 11/03/2020 10/13/2020  Weight (lbs) 218 lb 8 oz 227 lb 224 lb  Weight (kg) 99.111 kg 102.967 kg 101.606 kg      Telemetry     SB   3.26 sec pause at 6:30 am  - Personally Reviewed  ECG     No newPersonally Reviewed  Physical Exam   GEN: No acute distress.   Neck: No JVD Cardiac: RRR, no murmurs, rubs, or gallops.  Respiratory: Clear to auscultation bilaterally. GI: Soft, nontender, non-distended  MS: No edema; No deformity. Neuro:  Nonfocal  Psych: Normal affect   Labs    High Sensitivity Troponin:  No results for input(s): TROPONINIHS in the last 720 hours.    Chemistry Recent Labs  Lab 11/04/20 0320  NA 139  K 3.8  CL 107  CO2 26  GLUCOSE 95  BUN 8  CREATININE 0.98  CALCIUM 9.1  PROT 6.1*  ALBUMIN  3.3*  AST 17  ALT 19  ALKPHOS 50  BILITOT 0.9  GFRNONAA >60  ANIONGAP 6     Hematology Recent Labs  Lab 11/04/20 0320  WBC 8.0  RBC 4.62  HGB 15.3  HCT 43.5  MCV 94.2  MCH 33.1  MCHC 35.2  RDW 13.0  PLT 163    BNPNo results for input(s): BNP, PROBNP in the last 168 hours.   DDimer No results for input(s): DDIMER in the last 168 hours.   Radiology    CARDIAC CATHETERIZATION  Result Date: 11/03/2020 Formatting of this result is different from the original.   Prox RCA to Mid RCA lesion is 99% stenosed.   1st Mrg lesion is 100% stenosed.   Prox LAD to Mid LAD lesion is 30% stenosed.   Ost LM lesion is 85% stenosed.   The left ventricular systolic function is normal.   LV end diastolic pressure is mildly elevated.   The left ventricular ejection fraction is 55-65% by visual estimate. 1.  Severe ostial left main stenosis and subtotal occlusion of the right coronary artery with left-to-right collaterals.  Chronically occluded OM1.  Significant pressure dampening with catheter engagement of the left main.  The ostial left main stenosis is eccentric and seems to be due to a calcified plaque from the aorta with a slitlike origin. 2.  Normal LV systolic function and mildly elevated left ventricular end-diastolic pressure. Recommendations: Recommend transfer to [facility] for evaluation of CABG.  The patient has been having chest pain with any level of exertion.  His symptoms are unstable and with current anatomy, I do not think it would be safe to do this as an outpatient. Recommend starting heparin drip at 6 PM.    Cardiac Studies   LHC 11/03/20   Prox RCA to Mid RCA lesion is 99% stenosed.   1st Mrg lesion is 100% stenosed.   Prox LAD to Mid LAD lesion is 30% stenosed.   Ost LM lesion is 85% stenosed.   The left ventricular systolic function is normal.   LV end diastolic pressure is mildly elevated.   The left ventricular ejection fraction is 55-65% by visual estimate.   1.  Severe  ostial left main stenosis and subtotal occlusion of the right coronary artery with left-to-right collaterals.  Chronically occluded OM1.  Significant pressure dampening with catheter engagement of the left main.  The ostial left main stenosis is eccentric and seems to be due to a calcified plaque from the aorta with a slitlike origin. 2.  Normal LV systolic function and mildly elevated left ventricular end-diastolic pressure.   Recommendations: Recommend transfer to [facility] for evaluation of CABG.  The patient has been having chest pain with any level of exertion.  His symptoms are unstable and with current anatomy, I do not think it would be safe to do this as an outpatient. Recommend starting heparin drip at 6 PM.    Patient Profile     65 y.o. male with hx of CAD , HTN, HL, remote tobacco.  Cardiac cath showed 100% RCA and severe ostial LM   LVEF normal  [facility]  Assessment & Plan    1  CAD   Pt currently on heparin GTT   Asymptomatic  Follow    Echo pending   Will make sure that TCTS has been contacted  2  HTN   BP is fair  I would not push further given anatomy   He is on amlodipine 5   He is on metoprolol 50 bid   Was not on this at home   Tolerating     Watch HR   May need to back down    3  HL  LDL 86  HDL 38  Trig 103  Had been on lipitor 40 at home   WIll increase to 80  Goal LDL less thatn 70     For questions or updates, please contact [place] Please consult [email or web address] for contact info under        Signed, Dorris Carnes, MD  11/04/2020, 6:58 AM

## 2020-11-04 NOTE — Progress Notes (Signed)
ANTICOAGULATION CONSULT NOTE - Follow Up Consult  Pharmacy Consult for heparin Indication:  CAD awaiting CABG consult  Labs: Recent Labs    11/04/20 0320 11/04/20 1010  HGB 15.3  --   HCT 43.5  --   PLT 163  --   HEPARINUNFRC 0.19* 0.34  CREATININE 0.98  --     Assessment: 65yo male presenting with chest pain, he was transferred from [facility] to be evaluated for CABG by CVTS. Pharmacy asked to dose heparin.   Heparin level 0.34 and therapeutic. Hgb/HCT and PLT WNL. No signs/symptoms of bleeding per RN.   Goal of Therapy:  Heparin level 0.3-0.7 units/ml   Plan:  Continue heparin 1500 units/h Daily heparin level, CBC Monitor for s/sx of bleeding  Thank you for involving pharmacy in this patient's care.  Elita Quick, PharmD PGY1 Ambulatory Care Pharmacy Resident 11/04/2020 11:45 AM  **Pharmacist phone directory can be found on [email or web address] listed under [place]**

## 2020-11-04 NOTE — Consult Note (Signed)
[address]       [place] [address]             [phone]        Hayden Deleon  Medical Record [id number] Date of Birth: [birth year]  Referring: No ref. provider found Primary Care: Libby Maw, MD Primary Cardiologist:Muhammad Fletcher Anon, MD  Chief Complaint:   No chief complaint on file.   History of Present Illness:     65 year old male transferred from [facility] after undergoing elective left heart cath which showed severe left main/three-vessel coronary artery disease.  The patient states that he has been having exertional angina that is progressed over the last year.  He underwent a stress test in January of this year which was abnormal.  He currently is chest pain-free.  He denies any lower extremity swelling, orthopnea, or shortness of breath at rest.   Past Medical and Surgical History: Previous Chest Surgery: No Previous Chest Radiation: No Diabetes Mellitus: Yes.  HbA1C 5.7 Creatinine: 0.98  Past Medical History:  Diagnosis Date   Allergy    Anxiety    Arthritis    right shoulder   CAD (coronary artery disease)    a. 2003 Cath: OM1 100%, good collaterals, RCA 60-70p-->med rx; b. 10/2007 MV: low risk, EF 60%; c. 12/2015 MV: No isch/infarct; d. 04/2020 MV: mild ischemia to sm area of mid inflat & apical lat segments. EF >65%.   Carotid artery disease ([place])    a. 03/2020 Carotid U/S: RICA 123456, LICA 123456.   COPD (chronic obstructive pulmonary disease) ([place])    Significant Dr. Lamonte Sakai   Diastolic dysfunction    a. 03/2018 Echo: EF 60-65%, no rwma, Gr1 DD. Mildly dil LA. Nl RV fxn.   Diverticulosis of colon    Ejection fraction    EF 60%, nuclear, 2009   GERD (gastroesophageal reflux disease)    Gout    Hyperlipidemia    Hypertension    Prostatitis    Sleep apnea    uses cpap nightly    Past Surgical History:  Procedure Laterality Date   COLONOSCOPY  12/2003   [place] - polyp    deviated septum     KNEE ARTHROSCOPY     right   TOTAL SHOULDER ARTHROPLASTY Right 02/03/2018   Procedure: RIGHT REVERSE SHOULDER ARTHROPLASTY;  Surgeon: Meredith Pel, MD;  Location: [facility];  Service: Orthopedics;  Laterality: Right;   WISDOM TOOTH EXTRACTION      Social History: Support: Patient lives with his wife.  She usually  Social History   Tobacco Use  Smoking Status Former   Packs/day: 2.00   Years: 32.00   Pack years: 64.00   Types: Cigarettes   Quit date: 08/31/1999   Years since quitting: 21.1  Smokeless Tobacco Former    Social History   Substance and Sexual Activity  Alcohol Use No   Alcohol/week: 0.0 standard drinks     Allergies  Allergen Reactions   Avelox [Moxifloxacin Hcl In Nacl] Other (See Comments)    Facial swelling   Sulfamethoxazole-Trimethoprim Other (See Comments)    REACTION: sore mouth   Imdur [Isosorbide Nitrate] Other (See Comments)    Made pt feel groggy, loopy, bad dreams    Aspirin Other (See Comments)    REACTION: Nausea; fine with '81mg'$     Medications: Asprin: Yes Statin: Yes Beta Blocker: Yes Ace Inhibitor: No Anti-Coagulation: No  Current Facility-Administered Medications  Medication Dose Route Frequency  Provider Last Rate Last Admin   acetaminophen (TYLENOL) tablet 650 mg  650 mg Oral Q4H PRN Sarajane Jews, Callie E, PA-C       albuterol (PROVENTIL) (2.5 MG/3ML) 0.083% nebulizer solution 3 mL  3 mL Inhalation Q6H PRN Sande Rives E, PA-C       amLODipine (NORVASC) tablet 5 mg  5 mg Oral Daily Sande Rives E, PA-C   5 mg at 11/04/20 1109   aspirin EC tablet 81 mg  81 mg Oral Daily Darreld Mclean, PA-C   81 mg at 11/04/20 1110   atorvastatin (LIPITOR) tablet 80 mg  80 mg Oral Daily Fay Records, MD   80 mg at 11/04/20 1110   fluticasone furoate-vilanterol (BREO ELLIPTA) 100-25 MCG/INH 1 puff  1 puff Inhalation Daily Sande Rives E, PA-C   1 puff at 11/04/20 0846   heparin ADULT infusion 100 units/mL (25000  units/22m)  1,500 Units/hr Intravenous Continuous BLaren Everts RPH 15 mL/hr at 11/04/20 1349 1,500 Units/hr at 11/04/20 1349   metoprolol tartrate (LOPRESSOR) tablet 50 mg  50 mg Oral BID GDarreld Mclean PA-C   50 mg at 11/04/20 1112   multivitamin with minerals tablet 1 tablet  1 tablet Oral Daily GDarreld Mclean PA-C   1 tablet at 11/04/20 1110   mupirocin ointment (BACTROBAN) 2 % 1 application  1 application Nasal BID RSkeet Latch MD       nitroGLYCERIN (NITROSTAT) SL tablet 0.4 mg  0.4 mg Sublingual Q5 Min x 3 PRN GSande RivesE, PA-C       ondansetron (ZOFRAN) injection 4 mg  4 mg Intravenous Q6H PRN GSande RivesE, PA-C       sertraline (ZOLOFT) tablet 50 mg  50 mg Oral Daily GSande RivesE, PA-C   50 mg at 11/04/20 1111   umeclidinium bromide (INCRUSE ELLIPTA) 62.5 MCG/INH 1 puff  1 puff Inhalation Daily GSande RivesE, PA-C   1 puff at 11/04/20 0846    Medications Prior to Admission  Medication Sig Dispense Refill Last Dose   albuterol (VENTOLIN HFA) 108 (90 Base) MCG/ACT inhaler TAKE 2 PUFFS BY MOUTH EVERY 6 HOURS AS NEEDED FOR WHEEZE OR SHORTNESS OF BREATH (Patient taking differently: Inhale 2 puffs into the lungs every 6 (six) hours as needed for wheezing or shortness of breath.) 6.7 each 10 11/02/2020   amLODipine (NORVASC) 5 MG tablet Take 1 tablet (5 mg total) by mouth daily. 90 tablet 3 11/03/2020   atorvastatin (LIPITOR) 40 MG tablet TAKE 1 TABLET BY MOUTH EVERY DAY** Needs appointment vefore anymore refills** (Patient taking differently: Take 40 mg by mouth daily.) 90 tablet 0 11/03/2020   Cyanocobalamin (B-12) 3000 MCG SUBL Place 3,000 mcg under the tongue daily.   11/03/2020   metoprolol tartrate (LOPRESSOR) 50 MG tablet Take 1bid (Plz sched appt with new provider) (Patient taking differently: Take 50 mg by mouth 2 (two) times daily. Take 1bid (Plz sched appt with new provider)) 180 tablet 0 11/03/2020 at 0800   Multiple Vitamin (MULTIVITAMIN) capsule  Take 1 capsule by mouth daily.   11/02/2020   nitroGLYCERIN (NITROSTAT) 0.4 MG SL tablet Place 0.4 mg under the tongue every 5 (five) minutes as needed for chest pain.   unknown at unknown   sertraline (ZOLOFT) 50 MG tablet TAKE 1 TABLET BY MOUTH EVERY DAY (Patient taking differently: Take 50 mg by mouth daily.) 90 tablet 1 11/03/2020   TRELEGY ELLIPTA 100-62.5-25 MCG/INH AEPB TAKE 1 PUFF BY MOUTH EVERY DAY (  Patient taking differently: Inhale 1 puff into the lungs daily.) 180 each 2 11/03/2020    Family History  Problem Relation Age of Onset   Angina Mother        car accident   Asthma Father    Crohn's disease Sister    Colon cancer Neg Hx    Esophageal cancer Neg Hx    Rectal cancer Neg Hx    Stomach cancer Neg Hx      Review of Systems:   Review of Systems  Constitutional: Negative.   Respiratory:  Positive for shortness of breath.   Cardiovascular:  Positive for chest pain. Negative for orthopnea and leg swelling.  Musculoskeletal:  Positive for joint pain and myalgias.     Physical Exam: BP 127/73   Pulse (!) 53   Temp 97.9 F (36.6 C) (Oral)   Resp 16   Ht '5\' 11"'$  (1.803 m)   Wt 99.1 kg   SpO2 93%   BMI 30.47 kg/m  Physical Exam Constitutional:      General: He is not in acute distress.    Appearance: Normal appearance. He is not ill-appearing, toxic-appearing or diaphoretic.  HENT:     Head: Normocephalic and atraumatic.  Cardiovascular:     Rate and Rhythm: Bradycardia present.  Pulmonary:     Effort: Pulmonary effort is normal. No respiratory distress.  Abdominal:     General: Abdomen is flat. There is no distension.  Musculoskeletal:        General: Normal range of motion.     Cervical back: Normal range of motion.  Skin:    General: Skin is warm and dry.  Neurological:     General: No focal deficit present.     Mental Status: He is alert and oriented to person, place, and time.      Diagnostic Studies & Laboratory data:    Left Heart  Catherization: Left main/three-vessel coronary artery disease.  The PDA fills from left to right collaterals.  There appears to be a good target on the lateral wall as well as the LAD.  Echo: Preserved biventricular function.  No significant valvular disease.   I have independently reviewed the above radiologic studies and discussed with the patient   Recent Lab Findings: Lab Results  Component Value Date   WBC 8.0 11/04/2020   HGB 15.3 11/04/2020   HCT 43.5 11/04/2020   PLT 163 11/04/2020   GLUCOSE 95 11/04/2020   CHOL 145 11/04/2020   TRIG 103 11/04/2020   HDL 38 (L) 11/04/2020   LDLDIRECT 71.0 01/03/2016   LDLCALC 86 11/04/2020   ALT 19 11/04/2020   AST 17 11/04/2020   NA 139 11/04/2020   K 3.8 11/04/2020   CL 107 11/04/2020   CREATININE 0.98 11/04/2020   BUN 8 11/04/2020   CO2 26 11/04/2020   TSH 1.36 12/16/2019   HGBA1C 5.7 (H) 11/04/2020      Assessment / Plan:   65 year old male with left main/three-vessel coronary disease who is admitted following an elective left heart cath.  We discussed the risks and benefits of surgical revascularization and he is agreeable to proceed.  He is scheduled for 11/05/2020.  Carotids from 2021 were reviewed.  He had no significant disease at that time.     I  spent 40 minutes counseling the patient face to face.   Lajuana Matte 11/04/2020 2:32 PM

## 2020-11-04 NOTE — Anesthesia Preprocedure Evaluation (Addendum)
Anesthesia Evaluation  Patient identified by MRN, date of birth, ID band Patient awake    Reviewed: Allergy & Precautions, H&P , NPO status , Patient's Chart, lab work & pertinent test results, reviewed documented beta blocker date and time   Airway Mallampati: III  TM Distance: >3 FB Neck ROM: Limited    Dental no notable dental hx. (+) Teeth Intact, Dental Advisory Given   Pulmonary sleep apnea , COPD,  COPD inhaler, former smoker,    Pulmonary exam normal breath sounds clear to auscultation       Cardiovascular Exercise Tolerance: Good hypertension, Pt. on home beta blockers and Pt. on medications + angina + CAD   Rhythm:Regular Rate:Normal     Neuro/Psych Anxiety negative neurological ROS     GI/Hepatic Neg liver ROS, GERD  ,  Endo/Other  negative endocrine ROS  Renal/GU negative Renal ROS  negative genitourinary   Musculoskeletal  (+) Arthritis , Osteoarthritis,    Abdominal   Peds  Hematology negative hematology ROS (+)   Anesthesia Other Findings   Reproductive/Obstetrics negative OB ROS                            Anesthesia Physical Anesthesia Plan  ASA: 4  Anesthesia Plan: General   Post-op Pain Management:    Induction: Intravenous  PONV Risk Score and Plan: 2 and Midazolam and Treatment may vary due to age or medical condition  Airway Management Planned: Oral ETT  Additional Equipment: Arterial line, CVP, TEE and Ultrasound Guidance Line Placement  Intra-op Plan:   Post-operative Plan: Post-operative intubation/ventilation  Informed Consent: I have reviewed the patients History and Physical, chart, labs and discussed the procedure including the risks, benefits and alternatives for the proposed anesthesia with the patient or authorized representative who has indicated his/her understanding and acceptance.     Dental advisory given  Plan Discussed with:  CRNA  Anesthesia Plan Comments:        Anesthesia Quick Evaluation

## 2020-11-04 NOTE — Plan of Care (Signed)

## 2020-11-05 ENCOUNTER — Encounter (HOSPITAL_COMMUNITY): Payer: Self-pay | Admitting: Cardiovascular Disease

## 2020-11-05 ENCOUNTER — Inpatient Hospital Stay (HOSPITAL_COMMUNITY)
Admission: AD | Disposition: A | Discharge status: Home / Self Care | Payer: Self-pay | Source: Other Acute Inpatient Hospital | Attending: Thoracic Surgery (Cardiothoracic Vascular Surgery)

## 2020-11-05 ENCOUNTER — Inpatient Hospital Stay (HOSPITAL_COMMUNITY): Payer: BC Managed Care – PPO | Admitting: Certified Registered Nurse Anesthetist

## 2020-11-05 ENCOUNTER — Inpatient Hospital Stay (HOSPITAL_COMMUNITY): Payer: BC Managed Care – PPO

## 2020-11-05 DIAGNOSIS — Z951 Presence of aortocoronary bypass graft: Secondary | ICD-10-CM

## 2020-11-05 DIAGNOSIS — I25110 Atherosclerotic heart disease of native coronary artery with unstable angina pectoris: Secondary | ICD-10-CM | POA: Diagnosis not present

## 2020-11-05 HISTORY — PX: CORONARY ARTERY BYPASS GRAFT: SHX141

## 2020-11-05 LAB — POCT I-STAT 7, (LYTES, BLD GAS, ICA,H+H)
Acid-Base Excess: 1 mmol/L (ref 0.0–2.0)
Acid-Base Excess: 2 mmol/L (ref 0.0–2.0)
Acid-Base Excess: 1 mmol/L (ref 0.0–2.0)
Acid-base deficit: 1 mmol/L (ref 0.0–2.0)
Acid-base deficit: 1 mmol/L (ref 0.0–2.0)
Acid-base deficit: 4 mmol/L — ABNORMAL HIGH (ref 0.0–2.0)
Acid-base deficit: 5 mmol/L — ABNORMAL HIGH (ref 0.0–2.0)
Acid-base deficit: 5 mmol/L — ABNORMAL HIGH (ref 0.0–2.0)
Acid-base deficit: 6 mmol/L — ABNORMAL HIGH (ref 0.0–2.0)
Acid-base deficit: 6 mmol/L — ABNORMAL HIGH (ref 0.0–2.0)
Acid-base deficit: 6 mmol/L — ABNORMAL HIGH (ref 0.0–2.0)
Bicarbonate: 25.4 mmol/L (ref 20.0–28.0)
Bicarbonate: 27.9 mmol/L (ref 20.0–28.0)
Bicarbonate: 28.9 mmol/L — ABNORMAL HIGH (ref 20.0–28.0)
Bicarbonate: 25 mmol/L (ref 20.0–28.0)
Bicarbonate: 24.5 mmol/L (ref 20.0–28.0)
Bicarbonate: 22.9 mmol/L (ref 20.0–28.0)
Bicarbonate: 21.9 mmol/L (ref 20.0–28.0)
Bicarbonate: 20.8 mmol/L (ref 20.0–28.0)
Bicarbonate: 23 mmol/L (ref 20.0–28.0)
Bicarbonate: 21.8 mmol/L (ref 20.0–28.0)
Bicarbonate: 21 mmol/L (ref 20.0–28.0)
Calcium, Ion: 1.27 mmol/L (ref 1.15–1.40)
Calcium, Ion: 1.07 mmol/L — ABNORMAL LOW (ref 1.15–1.40)
Calcium, Ion: 1.15 mmol/L (ref 1.15–1.40)
Calcium, Ion: 0.99 mmol/L — ABNORMAL LOW (ref 1.15–1.40)
Calcium, Ion: 1.27 mmol/L (ref 1.15–1.40)
Calcium, Ion: 1.25 mmol/L (ref 1.15–1.40)
Calcium, Ion: 1.17 mmol/L (ref 1.15–1.40)
Calcium, Ion: 1.15 mmol/L (ref 1.15–1.40)
Calcium, Ion: 1.2 mmol/L (ref 1.15–1.40)
Calcium, Ion: 1.1 mmol/L — ABNORMAL LOW (ref 1.15–1.40)
Calcium, Ion: 1.11 mmol/L — ABNORMAL LOW (ref 1.15–1.40)
HCT: 41 % (ref 39.0–52.0)
HCT: 33 % — ABNORMAL LOW (ref 39.0–52.0)
HCT: 33 % — ABNORMAL LOW (ref 39.0–52.0)
HCT: 30 % — ABNORMAL LOW (ref 39.0–52.0)
HCT: 32 % — ABNORMAL LOW (ref 39.0–52.0)
HCT: 28 % — ABNORMAL LOW (ref 39.0–52.0)
HCT: 26 % — ABNORMAL LOW (ref 39.0–52.0)
HCT: 27 % — ABNORMAL LOW (ref 39.0–52.0)
HCT: 28 % — ABNORMAL LOW (ref 39.0–52.0)
HCT: 26 % — ABNORMAL LOW (ref 39.0–52.0)
HCT: 26 % — ABNORMAL LOW (ref 39.0–52.0)
Hemoglobin: 13.9 g/dL (ref 13.0–17.0)
Hemoglobin: 11.2 g/dL — ABNORMAL LOW (ref 13.0–17.0)
Hemoglobin: 11.2 g/dL — ABNORMAL LOW (ref 13.0–17.0)
Hemoglobin: 10.2 g/dL — ABNORMAL LOW (ref 13.0–17.0)
Hemoglobin: 10.9 g/dL — ABNORMAL LOW (ref 13.0–17.0)
Hemoglobin: 9.5 g/dL — ABNORMAL LOW (ref 13.0–17.0)
Hemoglobin: 8.8 g/dL — ABNORMAL LOW (ref 13.0–17.0)
Hemoglobin: 9.2 g/dL — ABNORMAL LOW (ref 13.0–17.0)
Hemoglobin: 9.5 g/dL — ABNORMAL LOW (ref 13.0–17.0)
Hemoglobin: 8.8 g/dL — ABNORMAL LOW (ref 13.0–17.0)
Hemoglobin: 8.8 g/dL — ABNORMAL LOW (ref 13.0–17.0)
O2 Saturation: 100 %
O2 Saturation: 100 %
O2 Saturation: 86 %
O2 Saturation: 100 %
O2 Saturation: 100 %
O2 Saturation: 93 %
O2 Saturation: 97 %
O2 Saturation: 88 %
O2 Saturation: 90 %
O2 Saturation: 92 %
O2 Saturation: 87 %
Patient temperature: 97.8
Patient temperature: 36
Patient temperature: 36.3
Patient temperature: 36.9
Patient temperature: 37.7
Potassium: 3.7 mmol/L (ref 3.5–5.1)
Potassium: 4 mmol/L (ref 3.5–5.1)
Potassium: 4.4 mmol/L (ref 3.5–5.1)
Potassium: 5.1 mmol/L (ref 3.5–5.1)
Potassium: 4.5 mmol/L (ref 3.5–5.1)
Potassium: 4.4 mmol/L (ref 3.5–5.1)
Potassium: 4.3 mmol/L (ref 3.5–5.1)
Potassium: 4.4 mmol/L (ref 3.5–5.1)
Potassium: 4.6 mmol/L (ref 3.5–5.1)
Potassium: 4.6 mmol/L (ref 3.5–5.1)
Potassium: 4.3 mmol/L (ref 3.5–5.1)
Sodium: 142 mmol/L (ref 135–145)
Sodium: 142 mmol/L (ref 135–145)
Sodium: 144 mmol/L (ref 135–145)
Sodium: 141 mmol/L (ref 135–145)
Sodium: 144 mmol/L (ref 135–145)
Sodium: 144 mmol/L (ref 135–145)
Sodium: 145 mmol/L (ref 135–145)
Sodium: 144 mmol/L (ref 135–145)
Sodium: 144 mmol/L (ref 135–145)
Sodium: 144 mmol/L (ref 135–145)
Sodium: 143 mmol/L (ref 135–145)
TCO2: 27 mmol/L (ref 22–32)
TCO2: 30 mmol/L (ref 22–32)
TCO2: 31 mmol/L (ref 22–32)
TCO2: 26 mmol/L (ref 22–32)
TCO2: 26 mmol/L (ref 22–32)
TCO2: 24 mmol/L (ref 22–32)
TCO2: 23 mmol/L (ref 22–32)
TCO2: 22 mmol/L (ref 22–32)
TCO2: 25 mmol/L (ref 22–32)
TCO2: 23 mmol/L (ref 22–32)
TCO2: 22 mmol/L (ref 22–32)
pCO2 arterial: 49.8 mmHg — ABNORMAL HIGH (ref 32.0–48.0)
pCO2 arterial: 54.1 mmHg — ABNORMAL HIGH (ref 32.0–48.0)
pCO2 arterial: 55.8 mmHg — ABNORMAL HIGH (ref 32.0–48.0)
pCO2 arterial: 36.5 mmHg (ref 32.0–48.0)
pCO2 arterial: 43.6 mmHg (ref 32.0–48.0)
pCO2 arterial: 46.7 mmHg (ref 32.0–48.0)
pCO2 arterial: 45.7 mmHg (ref 32.0–48.0)
pCO2 arterial: 46.9 mmHg (ref 32.0–48.0)
pCO2 arterial: 54.4 mmHg — ABNORMAL HIGH (ref 32.0–48.0)
pCO2 arterial: 56.3 mmHg — ABNORMAL HIGH (ref 32.0–48.0)
pCO2 arterial: 50.1 mmHg — ABNORMAL HIGH (ref 32.0–48.0)
pH, Arterial: 7.316 — ABNORMAL LOW (ref 7.350–7.450)
pH, Arterial: 7.307 — ABNORMAL LOW (ref 7.350–7.450)
pH, Arterial: 7.336 — ABNORMAL LOW (ref 7.350–7.450)
pH, Arterial: 7.443 (ref 7.350–7.450)
pH, Arterial: 7.357 (ref 7.350–7.450)
pH, Arterial: 7.298 — ABNORMAL LOW (ref 7.350–7.450)
pH, Arterial: 7.287 — ABNORMAL LOW (ref 7.350–7.450)
pH, Arterial: 7.231 — ABNORMAL LOW (ref 7.350–7.450)
pH, Arterial: 7.251 — ABNORMAL LOW (ref 7.350–7.450)
pH, Arterial: 7.194 — CL (ref 7.350–7.450)
pH, Arterial: 7.234 — ABNORMAL LOW (ref 7.350–7.450)
pO2, Arterial: 389 mmHg — ABNORMAL HIGH (ref 83.0–108.0)
pO2, Arterial: 363 mmHg — ABNORMAL HIGH (ref 83.0–108.0)
pO2, Arterial: 57 mmHg — ABNORMAL LOW (ref 83.0–108.0)
pO2, Arterial: 278 mmHg — ABNORMAL HIGH (ref 83.0–108.0)
pO2, Arterial: 280 mmHg — ABNORMAL HIGH (ref 83.0–108.0)
pO2, Arterial: 76 mmHg — ABNORMAL LOW (ref 83.0–108.0)
pO2, Arterial: 96 mmHg (ref 83.0–108.0)
pO2, Arterial: 63 mmHg — ABNORMAL LOW (ref 83.0–108.0)
pO2, Arterial: 65 mmHg — ABNORMAL LOW (ref 83.0–108.0)
pO2, Arterial: 78 mmHg — ABNORMAL LOW (ref 83.0–108.0)
pO2, Arterial: 66 mmHg — ABNORMAL LOW (ref 83.0–108.0)

## 2020-11-05 LAB — CBC
HCT: 47.7 % (ref 39.0–52.0)
HCT: 31.1 % — ABNORMAL LOW (ref 39.0–52.0)
HCT: 30.5 % — ABNORMAL LOW (ref 39.0–52.0)
Hemoglobin: 16.1 g/dL (ref 13.0–17.0)
Hemoglobin: 10.4 g/dL — ABNORMAL LOW (ref 13.0–17.0)
Hemoglobin: 10.1 g/dL — ABNORMAL LOW (ref 13.0–17.0)
MCH: 32.7 pg (ref 26.0–34.0)
MCH: 32.7 pg (ref 26.0–34.0)
MCH: 32.9 pg (ref 26.0–34.0)
MCHC: 33.8 g/dL (ref 30.0–36.0)
MCHC: 33.4 g/dL (ref 30.0–36.0)
MCHC: 33.1 g/dL (ref 30.0–36.0)
MCV: 96.8 fL (ref 80.0–100.0)
MCV: 97.8 fL (ref 80.0–100.0)
MCV: 99.3 fL (ref 80.0–100.0)
Platelets: 197 10*3/uL (ref 150–400)
Platelets: 109 10*3/uL — ABNORMAL LOW (ref 150–400)
Platelets: 152 10*3/uL (ref 150–400)
RBC: 4.93 MIL/uL (ref 4.22–5.81)
RBC: 3.18 MIL/uL — ABNORMAL LOW (ref 4.22–5.81)
RBC: 3.07 MIL/uL — ABNORMAL LOW (ref 4.22–5.81)
RDW: 13.1 % (ref 11.5–15.5)
RDW: 12.9 % (ref 11.5–15.5)
RDW: 13.2 % (ref 11.5–15.5)
WBC: 10.8 10*3/uL — ABNORMAL HIGH (ref 4.0–10.5)
WBC: 10.8 10*3/uL — ABNORMAL HIGH (ref 4.0–10.5)
WBC: 18 10*3/uL — ABNORMAL HIGH (ref 4.0–10.5)
nRBC: 0 % (ref 0.0–0.2)
nRBC: 0 % (ref 0.0–0.2)
nRBC: 0 % (ref 0.0–0.2)

## 2020-11-05 LAB — BASIC METABOLIC PANEL
Anion gap: 6 (ref 5–15)
Anion gap: 6 (ref 5–15)
BUN: 10 mg/dL (ref 8–23)
BUN: 9 mg/dL (ref 8–23)
CO2: 28 mmol/L (ref 22–32)
CO2: 24 mmol/L (ref 22–32)
Calcium: 9.2 mg/dL (ref 8.9–10.3)
Calcium: 7.8 mg/dL — ABNORMAL LOW (ref 8.9–10.3)
Chloride: 107 mmol/L (ref 98–111)
Chloride: 110 mmol/L (ref 98–111)
Creatinine, Ser: 1.05 mg/dL (ref 0.61–1.24)
Creatinine, Ser: 0.93 mg/dL (ref 0.61–1.24)
GFR, Estimated: >60 mL/min (ref >60)
GFR, Estimated: >60 mL/min (ref >60)
Glucose, Bld: 102 mg/dL — ABNORMAL HIGH (ref 70–99)
Glucose, Bld: 130 mg/dL — ABNORMAL HIGH (ref 70–99)
Potassium: 3.8 mmol/L (ref 3.5–5.1)
Potassium: 4.8 mmol/L (ref 3.5–5.1)
Sodium: 141 mmol/L (ref 135–145)
Sodium: 140 mmol/L (ref 135–145)

## 2020-11-05 LAB — GLUCOSE, CAPILLARY
Glucose-Capillary: 100 mg/dL — ABNORMAL HIGH (ref 70–99)
Glucose-Capillary: 112 mg/dL — ABNORMAL HIGH (ref 70–99)
Glucose-Capillary: 117 mg/dL — ABNORMAL HIGH (ref 70–99)
Glucose-Capillary: 129 mg/dL — ABNORMAL HIGH (ref 70–99)
Glucose-Capillary: 130 mg/dL — ABNORMAL HIGH (ref 70–99)
Glucose-Capillary: 142 mg/dL — ABNORMAL HIGH (ref 70–99)
Glucose-Capillary: 119 mg/dL — ABNORMAL HIGH (ref 70–99)
Glucose-Capillary: 115 mg/dL — ABNORMAL HIGH (ref 70–99)
Glucose-Capillary: 98 mg/dL (ref 70–99)
Glucose-Capillary: 99 mg/dL (ref 70–99)

## 2020-11-05 LAB — POCT I-STAT, CHEM 8
BUN: 10 mg/dL (ref 8–23)
BUN: 9 mg/dL (ref 8–23)
BUN: 9 mg/dL (ref 8–23)
BUN: 8 mg/dL (ref 8–23)
BUN: 9 mg/dL (ref 8–23)
Calcium, Ion: 1.28 mmol/L (ref 1.15–1.40)
Calcium, Ion: 1.26 mmol/L (ref 1.15–1.40)
Calcium, Ion: 1.1 mmol/L — ABNORMAL LOW (ref 1.15–1.40)
Calcium, Ion: 1.04 mmol/L — ABNORMAL LOW (ref 1.15–1.40)
Calcium, Ion: 1.29 mmol/L (ref 1.15–1.40)
Chloride: 105 mmol/L (ref 98–111)
Chloride: 107 mmol/L (ref 98–111)
Chloride: 106 mmol/L (ref 98–111)
Chloride: 108 mmol/L (ref 98–111)
Chloride: 109 mmol/L (ref 98–111)
Creatinine, Ser: 0.8 mg/dL (ref 0.61–1.24)
Creatinine, Ser: 0.8 mg/dL (ref 0.61–1.24)
Creatinine, Ser: 0.7 mg/dL (ref 0.61–1.24)
Creatinine, Ser: 0.7 mg/dL (ref 0.61–1.24)
Creatinine, Ser: 0.7 mg/dL (ref 0.61–1.24)
Glucose, Bld: 111 mg/dL — ABNORMAL HIGH (ref 70–99)
Glucose, Bld: 104 mg/dL — ABNORMAL HIGH (ref 70–99)
Glucose, Bld: 108 mg/dL — ABNORMAL HIGH (ref 70–99)
Glucose, Bld: 105 mg/dL — ABNORMAL HIGH (ref 70–99)
Glucose, Bld: 120 mg/dL — ABNORMAL HIGH (ref 70–99)
HCT: 39 % (ref 39.0–52.0)
HCT: 36 % — ABNORMAL LOW (ref 39.0–52.0)
HCT: 33 % — ABNORMAL LOW (ref 39.0–52.0)
HCT: 31 % — ABNORMAL LOW (ref 39.0–52.0)
HCT: 33 % — ABNORMAL LOW (ref 39.0–52.0)
Hemoglobin: 13.3 g/dL (ref 13.0–17.0)
Hemoglobin: 12.2 g/dL — ABNORMAL LOW (ref 13.0–17.0)
Hemoglobin: 11.2 g/dL — ABNORMAL LOW (ref 13.0–17.0)
Hemoglobin: 10.5 g/dL — ABNORMAL LOW (ref 13.0–17.0)
Hemoglobin: 11.2 g/dL — ABNORMAL LOW (ref 13.0–17.0)
Potassium: 3.8 mmol/L (ref 3.5–5.1)
Potassium: 4.2 mmol/L (ref 3.5–5.1)
Potassium: 5.1 mmol/L (ref 3.5–5.1)
Potassium: 4.4 mmol/L (ref 3.5–5.1)
Potassium: 5.1 mmol/L (ref 3.5–5.1)
Sodium: 142 mmol/L (ref 135–145)
Sodium: 142 mmol/L (ref 135–145)
Sodium: 140 mmol/L (ref 135–145)
Sodium: 142 mmol/L (ref 135–145)
Sodium: 144 mmol/L (ref 135–145)
TCO2: 26 mmol/L (ref 22–32)
TCO2: 26 mmol/L (ref 22–32)
TCO2: 26 mmol/L (ref 22–32)
TCO2: 26 mmol/L (ref 22–32)
TCO2: 27 mmol/L (ref 22–32)

## 2020-11-05 LAB — MAGNESIUM: Magnesium: 2.9 mg/dL — ABNORMAL HIGH (ref 1.7–2.4)

## 2020-11-05 LAB — ECHO INTRAOPERATIVE TEE
Height: 71 in
Weight: 3496 oz

## 2020-11-05 LAB — HEMOGLOBIN AND HEMATOCRIT, BLOOD
HCT: 33.3 % — ABNORMAL LOW (ref 39.0–52.0)
Hemoglobin: 11.5 g/dL — ABNORMAL LOW (ref 13.0–17.0)

## 2020-11-05 LAB — HEPARIN LEVEL (UNFRACTIONATED): Heparin Unfractionated: 0.63 IU/mL (ref 0.30–0.70)

## 2020-11-05 LAB — APTT: aPTT: 30 seconds (ref 24–36)

## 2020-11-05 LAB — PLATELET COUNT: Platelets: 146 10*3/uL — ABNORMAL LOW (ref 150–400)

## 2020-11-05 LAB — PROTIME-INR
INR: 1.4 — ABNORMAL HIGH (ref 0.8–1.2)
Prothrombin Time: 17.6 seconds — ABNORMAL HIGH (ref 11.4–15.2)

## 2020-11-05 SURGERY — CORONARY ARTERY BYPASS GRAFTING (CABG)
Anesthesia: General | Site: Chest

## 2020-11-05 MED ORDER — METOPROLOL TARTRATE 12.5 MG HALF TABLET
12.5000 mg | ORAL_TABLET | Freq: Two times a day (BID) | ORAL | Status: DC
Start: 2020-11-05 — End: 2020-11-10
  Administered 2020-11-06 – 2020-11-09 (×7): 12.5 mg via ORAL
  Filled 2020-11-05 (×7): qty 1

## 2020-11-05 MED ORDER — ALBUMIN HUMAN 5 % IV SOLN: INTRAVENOUS | Status: DC | PRN

## 2020-11-05 MED ORDER — SODIUM CHLORIDE 0.9% FLUSH
3.0000 mL | Freq: Two times a day (BID) | INTRAVENOUS | Status: DC
  Administered 2020-11-06 – 2020-11-07 (×3): 3 mL via INTRAVENOUS

## 2020-11-05 MED ORDER — SODIUM CHLORIDE 0.9 % IV SOLN: INTRAVENOUS | Status: DC

## 2020-11-05 MED ORDER — HEPARIN SODIUM (PORCINE) 1000 UNIT/ML IJ SOLN
INTRAMUSCULAR | Status: AC
  Filled 2020-11-05: qty 1

## 2020-11-05 MED ORDER — ACETAMINOPHEN 650 MG RE SUPP
650.0000 mg | Freq: Once | RECTAL | Status: AC
  Administered 2020-11-05: 650 mg via RECTAL

## 2020-11-05 MED ORDER — VANCOMYCIN HCL 1500 MG/300ML IV SOLN: 1500.0000 mg | INTRAVENOUS | Status: DC

## 2020-11-05 MED ORDER — SODIUM CHLORIDE (PF) 0.9 % IJ SOLN
OROMUCOSAL | Status: DC | PRN
  Administered 2020-11-05 (×2): 4 mL via TOPICAL

## 2020-11-05 MED ORDER — CEFAZOLIN SODIUM-DEXTROSE 2-4 GM/100ML-% IV SOLN
2.0000 g | Freq: Three times a day (TID) | INTRAVENOUS | Status: AC
Start: 2020-11-05 — End: 2020-11-07
  Administered 2020-11-05 – 2020-11-07 (×6): 2 g via INTRAVENOUS
  Filled 2020-11-05 (×7): qty 100

## 2020-11-05 MED ORDER — METOPROLOL TARTRATE 25 MG/10 ML ORAL SUSPENSION
12.5000 mg | Freq: Two times a day (BID) | ORAL | Status: DC
  Filled 2020-11-05: qty 5

## 2020-11-05 MED ORDER — ACETAMINOPHEN 500 MG PO TABS: 1000.0000 mg | ORAL_TABLET | Freq: Once | ORAL | Status: DC

## 2020-11-05 MED ORDER — CHLORHEXIDINE GLUCONATE 0.12 % MT SOLN
15.0000 mL | OROMUCOSAL | Status: AC
  Administered 2020-11-05: 15 mL via OROMUCOSAL

## 2020-11-05 MED ORDER — LACTATED RINGERS IV SOLN: 500.0000 mL | Freq: Once | INTRAVENOUS | Status: DC | PRN

## 2020-11-05 MED ORDER — LACTATED RINGERS IV SOLN: INTRAVENOUS | Status: DC | PRN

## 2020-11-05 MED ORDER — POTASSIUM CHLORIDE 10 MEQ/50ML IV SOLN: 10.0000 meq | INTRAVENOUS | Status: AC

## 2020-11-05 MED ORDER — MIDAZOLAM HCL (PF) 10 MG/2ML IJ SOLN
INTRAMUSCULAR | Status: AC
  Filled 2020-11-05: qty 2

## 2020-11-05 MED ORDER — CEFAZOLIN SODIUM-DEXTROSE 2-4 GM/100ML-% IV SOLN: 2.0000 g | INTRAVENOUS | Status: DC

## 2020-11-05 MED ORDER — SODIUM CHLORIDE 0.9 % IV SOLN: 250.0000 mL | INTRAVENOUS | Status: DC

## 2020-11-05 MED ORDER — CHLORHEXIDINE GLUCONATE CLOTH 2 % EX PADS
6.0000 | MEDICATED_PAD | Freq: Every day | CUTANEOUS | Status: DC
  Administered 2020-11-05 – 2020-11-07 (×3): 6 via TOPICAL

## 2020-11-05 MED ORDER — FENTANYL CITRATE (PF) 250 MCG/5ML IJ SOLN
INTRAMUSCULAR | Status: DC | PRN
  Administered 2020-11-05: 100 ug via INTRAVENOUS
  Administered 2020-11-05: 25 ug via INTRAVENOUS
  Administered 2020-11-05 (×3): 50 ug via INTRAVENOUS
  Administered 2020-11-05: 400 ug via INTRAVENOUS
  Administered 2020-11-05: 50 ug via INTRAVENOUS
  Administered 2020-11-05: 100 ug via INTRAVENOUS
  Administered 2020-11-05: 50 ug via INTRAVENOUS
  Administered 2020-11-05: 25 ug via INTRAVENOUS
  Administered 2020-11-05 (×2): 50 ug via INTRAVENOUS
  Administered 2020-11-05: 100 ug via INTRAVENOUS
  Administered 2020-11-05: 50 ug via INTRAVENOUS
  Administered 2020-11-05: 100 ug via INTRAVENOUS

## 2020-11-05 MED ORDER — PROTAMINE SULFATE 10 MG/ML IV SOLN
INTRAVENOUS | Status: AC
  Filled 2020-11-05: qty 5

## 2020-11-05 MED ORDER — ONDANSETRON HCL 4 MG/2ML IJ SOLN: 4.0000 mg | Freq: Four times a day (QID) | INTRAMUSCULAR | Status: DC | PRN

## 2020-11-05 MED ORDER — ALBUMIN HUMAN 5 % IV SOLN
INTRAVENOUS | Status: AC
  Administered 2020-11-05: 12.5 g via INTRAVENOUS
  Filled 2020-11-05: qty 250

## 2020-11-05 MED ORDER — LACTATED RINGERS IV SOLN: INTRAVENOUS | Status: DC

## 2020-11-05 MED ORDER — PROTAMINE SULFATE 10 MG/ML IV SOLN
INTRAVENOUS | Status: DC | PRN
  Administered 2020-11-05: 340 mg via INTRAVENOUS
  Administered 2020-11-05: 10 mg via INTRAVENOUS

## 2020-11-05 MED ORDER — ACETAMINOPHEN 160 MG/5ML PO SOLN: 650.0000 mg | Freq: Once | ORAL | Status: AC

## 2020-11-05 MED ORDER — SODIUM CHLORIDE 0.45 % IV SOLN: INTRAVENOUS | Status: DC | PRN

## 2020-11-05 MED ORDER — PROPOFOL 10 MG/ML IV BOLUS
INTRAVENOUS | Status: AC
  Filled 2020-11-05: qty 20

## 2020-11-05 MED ORDER — HEMOSTATIC AGENTS (NO CHARGE) OPTIME
TOPICAL | Status: DC | PRN
  Administered 2020-11-05: 1 via TOPICAL

## 2020-11-05 MED ORDER — PANTOPRAZOLE SODIUM 40 MG PO TBEC
40.0000 mg | DELAYED_RELEASE_TABLET | Freq: Every day | ORAL | Status: DC
  Administered 2020-11-07 – 2020-11-10 (×4): 40 mg via ORAL
  Filled 2020-11-05 (×4): qty 1

## 2020-11-05 MED ORDER — ASPIRIN 81 MG PO CHEW: 324.0000 mg | CHEWABLE_TABLET | Freq: Every day | ORAL | Status: DC

## 2020-11-05 MED ORDER — CHLORHEXIDINE GLUCONATE 0.12% ORAL RINSE (MEDLINE KIT)
15.0000 mL | Freq: Two times a day (BID) | OROMUCOSAL | Status: DC
  Administered 2020-11-06: 15 mL via OROMUCOSAL

## 2020-11-05 MED ORDER — DEXTROSE 50 % IV SOLN: 0.0000 mL | INTRAVENOUS | Status: DC | PRN

## 2020-11-05 MED ORDER — ROCURONIUM BROMIDE 10 MG/ML (PF) SYRINGE
PREFILLED_SYRINGE | INTRAVENOUS | Status: AC
  Filled 2020-11-05: qty 20

## 2020-11-05 MED ORDER — ATORVASTATIN CALCIUM 80 MG PO TABS
80.0000 mg | ORAL_TABLET | Freq: Every day | ORAL | Status: DC
  Administered 2020-11-06 – 2020-11-10 (×5): 80 mg via ORAL
  Filled 2020-11-05 (×5): qty 1

## 2020-11-05 MED ORDER — FENTANYL CITRATE (PF) 250 MCG/5ML IJ SOLN
INTRAMUSCULAR | Status: AC
  Filled 2020-11-05: qty 25

## 2020-11-05 MED ORDER — DEXMEDETOMIDINE HCL IN NACL 400 MCG/100ML IV SOLN
0.0000 ug/kg/h | INTRAVENOUS | Status: DC
  Administered 2020-11-05: 0.5 ug/kg/h via INTRAVENOUS
  Administered 2020-11-06: 0.7 ug/kg/h via INTRAVENOUS
  Filled 2020-11-05 (×2): qty 100

## 2020-11-05 MED ORDER — FAMOTIDINE IN NACL 20-0.9 MG/50ML-% IV SOLN
INTRAVENOUS | Status: AC
  Filled 2020-11-05: qty 50

## 2020-11-05 MED ORDER — MORPHINE SULFATE (PF) 2 MG/ML IV SOLN
1.0000 mg | INTRAVENOUS | Status: DC | PRN
  Administered 2020-11-06 – 2020-11-07 (×2): 2 mg via INTRAVENOUS
  Filled 2020-11-05 (×2): qty 1

## 2020-11-05 MED ORDER — ACETAMINOPHEN 160 MG/5ML PO SOLN
1000.0000 mg | Freq: Four times a day (QID) | ORAL | Status: DC
  Administered 2020-11-05 – 2020-11-06 (×2): 1000 mg
  Filled 2020-11-05 (×2): qty 40.6

## 2020-11-05 MED ORDER — MIDAZOLAM HCL 5 MG/5ML IJ SOLN
INTRAMUSCULAR | Status: DC | PRN
  Administered 2020-11-05 (×2): 1 mg via INTRAVENOUS
  Administered 2020-11-05: 2 mg via INTRAVENOUS
  Administered 2020-11-05: 1 mg via INTRAVENOUS
  Administered 2020-11-05: 3 mg via INTRAVENOUS

## 2020-11-05 MED ORDER — DOCUSATE SODIUM 100 MG PO CAPS
200.0000 mg | ORAL_CAPSULE | Freq: Every day | ORAL | Status: DC
  Administered 2020-11-06 – 2020-11-07 (×2): 200 mg via ORAL
  Filled 2020-11-05 (×2): qty 2

## 2020-11-05 MED ORDER — INSULIN REGULAR(HUMAN) IN NACL 100-0.9 UT/100ML-% IV SOLN: INTRAVENOUS | Status: DC

## 2020-11-05 MED ORDER — PLASMA-LYTE A IV SOLN
INTRAVENOUS | Status: DC | PRN
  Administered 2020-11-05: 1000 mL

## 2020-11-05 MED ORDER — BISACODYL 5 MG PO TBEC
10.0000 mg | DELAYED_RELEASE_TABLET | Freq: Every day | ORAL | Status: DC
  Administered 2020-11-06 – 2020-11-07 (×2): 10 mg via ORAL
  Filled 2020-11-05 (×2): qty 2

## 2020-11-05 MED ORDER — 0.9 % SODIUM CHLORIDE (POUR BTL) OPTIME
TOPICAL | Status: DC | PRN
  Administered 2020-11-05: 5000 mL

## 2020-11-05 MED ORDER — PROPOFOL 10 MG/ML IV BOLUS
INTRAVENOUS | Status: DC | PRN
  Administered 2020-11-05 (×2): 10 mg via INTRAVENOUS
  Administered 2020-11-05: 50 mg via INTRAVENOUS
  Administered 2020-11-05: 20 mg via INTRAVENOUS

## 2020-11-05 MED ORDER — ROCURONIUM BROMIDE 10 MG/ML (PF) SYRINGE
PREFILLED_SYRINGE | INTRAVENOUS | Status: DC | PRN
  Administered 2020-11-05: 100 mg via INTRAVENOUS
  Administered 2020-11-05 (×2): 50 mg via INTRAVENOUS

## 2020-11-05 MED ORDER — HEPARIN SODIUM (PORCINE) 1000 UNIT/ML IJ SOLN
INTRAMUSCULAR | Status: DC | PRN
  Administered 2020-11-05: 35000 [IU] via INTRAVENOUS

## 2020-11-05 MED ORDER — ALBUMIN HUMAN 5 % IV SOLN
250.0000 mL | INTRAVENOUS | Status: AC | PRN
  Administered 2020-11-05 (×3): 12.5 g via INTRAVENOUS
  Filled 2020-11-05 (×2): qty 250

## 2020-11-05 MED ORDER — FLUTICASONE FUROATE-VILANTEROL 100-25 MCG/INH IN AEPB
1.0000 | INHALATION_SPRAY | Freq: Every day | RESPIRATORY_TRACT | Status: DC
  Administered 2020-11-06 – 2020-11-10 (×5): 1 via RESPIRATORY_TRACT
  Filled 2020-11-05: qty 28

## 2020-11-05 MED ORDER — ASPIRIN EC 325 MG PO TBEC
325.0000 mg | DELAYED_RELEASE_TABLET | Freq: Every day | ORAL | Status: DC
  Administered 2020-11-06 – 2020-11-10 (×5): 325 mg via ORAL
  Filled 2020-11-05 (×5): qty 1

## 2020-11-05 MED ORDER — OXYCODONE HCL 5 MG PO TABS
5.0000 mg | ORAL_TABLET | ORAL | Status: DC | PRN
  Administered 2020-11-06: 10 mg via ORAL
  Administered 2020-11-07: 5 mg via ORAL
  Administered 2020-11-07: 10 mg via ORAL
  Administered 2020-11-07: 5 mg via ORAL
  Administered 2020-11-08 – 2020-11-09 (×2): 10 mg via ORAL
  Filled 2020-11-05 (×2): qty 2
  Filled 2020-11-05: qty 1
  Filled 2020-11-05: qty 2
  Filled 2020-11-05: qty 1
  Filled 2020-11-05: qty 2

## 2020-11-05 MED ORDER — ALBUMIN HUMAN 5 % IV SOLN
25.0000 g | Freq: Once | INTRAVENOUS | Status: AC
  Administered 2020-11-05: 25 g via INTRAVENOUS

## 2020-11-05 MED ORDER — METOPROLOL TARTRATE 5 MG/5ML IV SOLN: 2.5000 mg | INTRAVENOUS | Status: DC | PRN

## 2020-11-05 MED ORDER — ACETAMINOPHEN 500 MG PO TABS
1000.0000 mg | ORAL_TABLET | Freq: Four times a day (QID) | ORAL | Status: DC
  Administered 2020-11-06 – 2020-11-10 (×14): 1000 mg via ORAL
  Filled 2020-11-05 (×14): qty 2

## 2020-11-05 MED ORDER — MAGNESIUM SULFATE 4 GM/100ML IV SOLN
INTRAVENOUS | Status: AC
  Administered 2020-11-05: 4 g via INTRAVENOUS
  Filled 2020-11-05: qty 100

## 2020-11-05 MED ORDER — NOREPINEPHRINE 4 MG/250ML-% IV SOLN
0.0000 ug/min | INTRAVENOUS | Status: DC
  Administered 2020-11-05: 2 ug/min via INTRAVENOUS

## 2020-11-05 MED ORDER — NICARDIPINE HCL IN NACL 20-0.86 MG/200ML-% IV SOLN
5.0000 mg/h | INTRAVENOUS | Status: DC
  Filled 2020-11-05 (×2): qty 200

## 2020-11-05 MED ORDER — VANCOMYCIN HCL IN DEXTROSE 1-5 GM/200ML-% IV SOLN
1000.0000 mg | Freq: Once | INTRAVENOUS | Status: AC
  Administered 2020-11-05: 1000 mg via INTRAVENOUS
  Filled 2020-11-05: qty 200

## 2020-11-05 MED ORDER — MAGNESIUM SULFATE 4 GM/100ML IV SOLN: 4.0000 g | Freq: Once | INTRAVENOUS | Status: AC

## 2020-11-05 MED ORDER — BISACODYL 10 MG RE SUPP: 10.0000 mg | Freq: Every day | RECTAL | Status: DC

## 2020-11-05 MED ORDER — SODIUM CHLORIDE 0.9% FLUSH: 3.0000 mL | INTRAVENOUS | Status: DC | PRN

## 2020-11-05 MED ORDER — FAMOTIDINE IN NACL 20-0.9 MG/50ML-% IV SOLN
20.0000 mg | Freq: Two times a day (BID) | INTRAVENOUS | Status: AC
  Administered 2020-11-05 (×2): 20 mg via INTRAVENOUS
  Filled 2020-11-05: qty 50

## 2020-11-05 MED ORDER — PROTAMINE SULFATE 10 MG/ML IV SOLN
INTRAVENOUS | Status: AC
  Filled 2020-11-05: qty 25

## 2020-11-05 MED ORDER — ORAL CARE MOUTH RINSE
15.0000 mL | OROMUCOSAL | Status: DC
  Administered 2020-11-06 (×4): 15 mL via OROMUCOSAL

## 2020-11-05 MED ORDER — TRAMADOL HCL 50 MG PO TABS
50.0000 mg | ORAL_TABLET | ORAL | Status: DC | PRN
  Administered 2020-11-05 – 2020-11-06 (×2): 50 mg via ORAL
  Filled 2020-11-05 (×3): qty 1

## 2020-11-05 MED ORDER — MIDAZOLAM HCL 2 MG/2ML IJ SOLN: 2.0000 mg | INTRAMUSCULAR | Status: DC | PRN

## 2020-11-05 SURGICAL SUPPLY — 87 items
BAG DECANTER FOR FLEXI CONT (MISCELLANEOUS) ×3 IMPLANT
BLADE CLIPPER SURG (BLADE) ×3 IMPLANT
BLADE STERNUM SYSTEM 6 (BLADE) ×3 IMPLANT
BLANKET WARM CARDIAC ADLT BAIR (MISCELLANEOUS) ×3 IMPLANT
BNDG ELASTIC 4X5.8 VLCR STR LF (GAUZE/BANDAGES/DRESSINGS) ×6 IMPLANT
BNDG ELASTIC 6X5.8 VLCR STR LF (GAUZE/BANDAGES/DRESSINGS) ×6 IMPLANT
BNDG GAUZE ELAST 4 BULKY (GAUZE/BANDAGES/DRESSINGS) ×6 IMPLANT
CABLE SURGICAL S-101-97-12 (CABLE) ×3 IMPLANT
CANISTER SUCT 3000ML PPV (MISCELLANEOUS) ×3 IMPLANT
CANNULA MC2 2 STG 29/37 NON-V (CANNULA) ×2 IMPLANT
CANNULA MC2 TWO STAGE (CANNULA) ×3
CANNULA NON VENT 20FR 12 (CANNULA) IMPLANT
CANNULA NON VENT 22FR 12 (CANNULA) ×3 IMPLANT
CATH ROBINSON RED A/P 18FR (CATHETERS) ×6 IMPLANT
CLIP RETRACTION 3.0MM CORONARY (MISCELLANEOUS) IMPLANT
CLIP VESOCCLUDE MED 24/CT (CLIP) IMPLANT
CLIP VESOCCLUDE SM WIDE 24/CT (CLIP) IMPLANT
CONN ST 1/2X1/2  BEN (MISCELLANEOUS)
CONN ST 1/2X1/2 BEN (MISCELLANEOUS) IMPLANT
CONNECTOR BLAKE 2:1 CARIO BLK (MISCELLANEOUS) ×3 IMPLANT
CONTAINER PROTECT SURGISLUSH (MISCELLANEOUS) ×6 IMPLANT
DRAIN CHANNEL 19F RND (DRAIN) ×9 IMPLANT
DRAIN CONNECTOR BLAKE 1:1 (MISCELLANEOUS) ×3 IMPLANT
DRAPE CARDIOVASCULAR INCISE (DRAPES) ×3
DRAPE INCISE IOBAN 66X45 STRL (DRAPES) IMPLANT
DRAPE SRG 135X102X78XABS (DRAPES) ×2 IMPLANT
DRAPE WARM FLUID 44X44 (DRAPES) ×3 IMPLANT
DRSG AQUACEL AG ADV 3.5X10 (GAUZE/BANDAGES/DRESSINGS) ×3 IMPLANT
DRSG AQUACEL AG ADV 3.5X14 (GAUZE/BANDAGES/DRESSINGS) ×3 IMPLANT
DRSG COVADERM 4X14 (GAUZE/BANDAGES/DRESSINGS) IMPLANT
ELECT BLADE 4.0 EZ CLEAN MEGAD (MISCELLANEOUS) ×3
ELECT REM PT RETURN 9FT ADLT (ELECTROSURGICAL) ×6
ELECTRODE BLDE 4.0 EZ CLN MEGD (MISCELLANEOUS) ×2 IMPLANT
ELECTRODE REM PT RTRN 9FT ADLT (ELECTROSURGICAL) ×4 IMPLANT
FELT TEFLON 1X6 (MISCELLANEOUS) ×3 IMPLANT
GAUZE 4X4 16PLY ~~LOC~~+RFID DBL (SPONGE) ×3 IMPLANT
GAUZE SPONGE 4X4 12PLY STRL (GAUZE/BANDAGES/DRESSINGS) ×9 IMPLANT
GAUZE SPONGE 4X4 12PLY STRL LF (GAUZE/BANDAGES/DRESSINGS) ×9 IMPLANT
GLOVE SURG ENC MOIS LTX SZ7 (GLOVE) ×6 IMPLANT
GLOVE SURG ENC TEXT LTX SZ7.5 (GLOVE) ×6 IMPLANT
GOWN STRL REUS W/ TWL LRG LVL3 (GOWN DISPOSABLE) ×8 IMPLANT
GOWN STRL REUS W/ TWL XL LVL3 (GOWN DISPOSABLE) ×16 IMPLANT
GOWN STRL REUS W/TWL LRG LVL3 (GOWN DISPOSABLE) ×12
GOWN STRL REUS W/TWL XL LVL3 (GOWN DISPOSABLE) ×24
HEMOSTAT POWDER SURGIFOAM 1G (HEMOSTASIS) ×6 IMPLANT
HEMOSTAT SURGICEL 2X14 (HEMOSTASIS) ×3 IMPLANT
INSERT SUTURE HOLDER (MISCELLANEOUS) ×3 IMPLANT
KIT BASIN OR (CUSTOM PROCEDURE TRAY) ×3 IMPLANT
KIT SUCTION CATH 14FR (SUCTIONS) ×3 IMPLANT
KIT TURNOVER KIT B (KITS) ×3 IMPLANT
KIT VASOVIEW HEMOPRO 2 VH 4000 (KITS) ×3 IMPLANT
LEAD PACING MYOCARDI (MISCELLANEOUS) ×3 IMPLANT
MARKER GRAFT CORONARY BYPASS (MISCELLANEOUS) ×6 IMPLANT
MARKER PEN SURG W/LABELS BLK (STERILIZATION PRODUCTS) ×3 IMPLANT
NS IRRIG 1000ML POUR BTL (IV SOLUTION) ×15 IMPLANT
PACK ACCESSORY CANNULA KIT (KITS) ×3 IMPLANT
PACK E OPEN HEART (SUTURE) ×3 IMPLANT
PACK OPEN HEART (CUSTOM PROCEDURE TRAY) ×3 IMPLANT
PAD ARMBOARD 7.5X6 YLW CONV (MISCELLANEOUS) ×6 IMPLANT
PAD ELECT DEFIB RADIOL ZOLL (MISCELLANEOUS) ×3 IMPLANT
PENCIL BUTTON HOLSTER BLD 10FT (ELECTRODE) ×3 IMPLANT
POSITIONER HEAD DONUT 9IN (MISCELLANEOUS) ×3 IMPLANT
PUNCH AORTIC ROTATE 4.0MM (MISCELLANEOUS) ×3 IMPLANT
SET MPS 3-ND DEL (MISCELLANEOUS) ×3 IMPLANT
SPONGE T-LAP 18X18 ~~LOC~~+RFID (SPONGE) ×12 IMPLANT
SPONGE T-LAP 4X18 ~~LOC~~+RFID (SPONGE) ×3 IMPLANT
SUPPORT HEART JANKE-BARRON (MISCELLANEOUS) ×3 IMPLANT
SUT BONE WAX W31G (SUTURE) ×3 IMPLANT
SUT ETHIBOND X763 2 0 SH 1 (SUTURE) ×6 IMPLANT
SUT MNCRL AB 3-0 PS2 18 (SUTURE) ×6 IMPLANT
SUT MNCRL AB 4-0 PS2 18 (SUTURE) ×3 IMPLANT
SUT PDS AB 1 CTX 36 (SUTURE) ×6 IMPLANT
SUT PROLENE 4 0 SH DA (SUTURE) ×6 IMPLANT
SUT PROLENE 5 0 C 1 36 (SUTURE) ×15 IMPLANT
SUT PROLENE 7 0 BV1 MDA (SUTURE) ×6 IMPLANT
SUT STEEL 6MS V (SUTURE) ×6 IMPLANT
SUT VIC AB 2-0 CT1 27 (SUTURE) ×3
SUT VIC AB 2-0 CT1 TAPERPNT 27 (SUTURE) ×2 IMPLANT
SYSTEM SAHARA CHEST DRAIN ATS (WOUND CARE) ×3 IMPLANT
TAPE CLOTH SURG 4X10 WHT LF (GAUZE/BANDAGES/DRESSINGS) ×9 IMPLANT
TAPE PAPER 2X10 WHT MICROPORE (GAUZE/BANDAGES/DRESSINGS) ×3 IMPLANT
TOWEL GREEN STERILE (TOWEL DISPOSABLE) ×3 IMPLANT
TOWEL GREEN STERILE FF (TOWEL DISPOSABLE) ×3 IMPLANT
TRAY FOLEY SLVR 16FR TEMP STAT (SET/KITS/TRAYS/PACK) ×3 IMPLANT
TUBING LAP HI FLOW INSUFFLATIO (TUBING) ×3 IMPLANT
UNDERPAD 30X36 HEAVY ABSORB (UNDERPADS AND DIAPERS) ×3 IMPLANT
WATER STERILE IRR 1000ML POUR (IV SOLUTION) ×6 IMPLANT

## 2020-11-05 NOTE — Progress Notes (Signed)
Spoke with Dr. Kipp Brood regarding ongoing acidosis noted on ABG. Fluid was administered per MD instruction without resolution of acidosis. RN instructed to discontinue ABG checks.

## 2020-11-05 NOTE — Anesthesia Procedure Notes (Signed)
Procedure Name: Intubation Date/Time: 11/05/2020 8:07 AM Performed by: Clearnce Sorrel, CRNA Pre-anesthesia Checklist: Patient identified, Emergency Drugs available, Suction available and Patient being monitored Patient Re-evaluated:Patient Re-evaluated prior to induction Oxygen Delivery Method: Circle System Utilized Preoxygenation: Pre-oxygenation with 100% oxygen Induction Type: IV induction Ventilation: Mask ventilation without difficulty Laryngoscope Size: Mac and 4 Grade View: Grade III Tube type: Oral Tube size: 8.0 mm Number of attempts: 1 Airway Equipment and Method: Stylet and Oral airway Placement Confirmation: positive ETCO2 and breath sounds checked- equal and bilateral Secured at: 23 cm Tube secured with: Tape Dental Injury: Teeth and Oropharynx as per pre-operative assessment

## 2020-11-05 NOTE — Progress Notes (Signed)
     [address]       [place] [address]             [phone]       No events  Vitals:   11/05/20 0551 11/05/20 0657  BP: 132/81 (!) 181/71  Pulse: (!) 53 (!) 50  Resp: 19 18  Temp: 97.9 F (36.6 C) 97.7 F (36.5 C)  SpO2: 95% 91%   Alert NAD Sinus brady EWOB  OR today for CABG 3  Jenifer Struve O Tida Saner

## 2020-11-05 NOTE — Progress Notes (Signed)
Assumed care at 1900.  Pt resting comfortably in bed on vent. Waking up and following commands.  Norepi infusing at 58mg/min, neo infusing at 571m/min and being titrated as pts BP tolerates.  CI ranging between 3.0-4.1. SVR 500-650. SV 119-129.  RT at bedside, placed pt on PS/CPAP. Pt does not appear in distress. Sats maintaining 91-92%.  ABG obtained. See results for details.  2105- Spoke with Dr. LiKipp Deleon updated on patient hemodynamics and recent ABG/lab work.  Orders to keep patient intubated overnight. Spoke with pts wife Hayden Almondnd updated on patient status.  Questions/concerns addressed at this time. Emotional support provided.

## 2020-11-05 NOTE — Transfer of Care (Signed)
Immediate Anesthesia Transfer of Care Note  Patient: Olson Fackrell  Procedure(s) Performed: CORONARY ARTERY BYPASS GRAFTING (CABG) TIMES 3, USING LEFT INTERNAL MAMMARY ARTERY AND GREATER SAPHEOUS VEIN HARVESTED ENDOSCOPICALLY LEFT AND RIGHT LEGS (Chest) APPLICATION OF CELL SAVER  Patient Location: SICU  Anesthesia Type:General  Level of Consciousness: Patient remains intubated per anesthesia plan  Airway & Oxygen Therapy: Patient remains intubated per anesthesia plan  Post-op Assessment: Report given to RN and Post -op Vital signs reviewed and stable  Post vital signs: Reviewed and stable  Last Vitals:  Vitals Value Taken Time  BP 80/54 11/05/20 1345  Temp    Pulse 57 11/05/20 1347  Resp 12 11/05/20 1347  SpO2 94 % 11/05/20 1347  Vitals shown include unvalidated device data.  Last Pain:  Vitals:   11/05/20 0657  TempSrc: Oral  PainSc: 0-No pain      Patients Stated Pain Goal: 0 ([id number])  Complications: No notable events documented.

## 2020-11-05 NOTE — Plan of Care (Signed)
  Problem: Cardiovascular: Goal: Ability to achieve and maintain adequate cardiovascular perfusion will improve Outcome: Progressing Goal: Vascular access site(s) Level 0-1 will be maintained Outcome: Completed/Met   Problem: Education: Goal: Knowledge of General Education information will improve Description: Including pain rating scale, medication(s)/side effects and non-pharmacologic comfort measures Outcome: Progressing   Problem: Activity: Goal: Risk for activity intolerance will decrease Outcome: Progressing   Problem: Nutrition: Goal: Adequate nutrition will be maintained Outcome: Progressing   Problem: Coping: Goal: Level of anxiety will decrease Outcome: Progressing   Problem: Elimination: Goal: Will not experience complications related to bowel motility Outcome: Progressing Goal: Will not experience complications related to urinary retention Outcome: Progressing   Problem: Pain Managment: Goal: General experience of comfort will improve Outcome: Progressing   Problem: Safety: Goal: Ability to remain free from injury will improve Outcome: Progressing   Problem: Skin Integrity: Goal: Risk for impaired skin integrity will decrease Outcome: Progressing

## 2020-11-05 NOTE — Anesthesia Procedure Notes (Signed)
Arterial Line Insertion Start/End8/09/2020 7:15 AM, 11/05/2020 7:18 AM Performed by: Babs Bertin, CRNA  Patient location: Pre-op. Preanesthetic checklist: patient identified, IV checked, site marked, risks and benefits discussed, surgical consent, monitors and equipment checked, pre-op evaluation, timeout performed and anesthesia consent Lidocaine 1% used for infiltration Left, radial was placed Catheter size: 20 G Hand hygiene performed  and maximum sterile barriers used   Attempts: 1 Procedure performed without using ultrasound guided technique. Following insertion, dressing applied. Post procedure assessment: normal and unchanged

## 2020-11-05 NOTE — Brief Op Note (Signed)
11/03/2020 - 11/05/2020  11:08 AM  PATIENT:  Hayden Deleon  65 y.o. male  PRE-OPERATIVE DIAGNOSIS:  coronary artery disease  POST-OPERATIVE DIAGNOSIS:  coronary artery disease  PROCEDURE:   CORONARY ARTERY BYPASS GRAFTING (CABG) TIMES, USING LEFT INTERNAL MAMMARY ARTERY AND GREATER SAPHEOUS VEIN HARVESTED ENDOSCOPICALLY LEFT AND RIGHT THIGHS   LIMA->LAD  SVG->OM  SVG->RCA  APPLICATION OF CELL SAVER  SURGEON:  Lightfoot, Lucile Crater, MD   PHYSICIAN ASSISTANT: Cassiel Fernandez  ASSISTANTS: Gaylyn Cheers, RN, RN First Assistant   ANESTHESIA:   general  EBL:  per anesthesia and perfusion records   BLOOD ADMINISTERED:  none  DRAINS:  mediastinal and bilateral pleural drains    LOCAL MEDICATIONS USED:  NONE  SPECIMEN:  No Specimen  DISPOSITION OF SPECIMEN:  N/A  COUNTS:  YES  DICTATION: .Dragon Dictation  PLAN OF CARE: Admit to inpatient   PATIENT DISPOSITION:  ICU - intubated and hemodynamically stable.   Delay start of Pharmacological VTE agent (>24hrs) due to surgical blood loss or risk of bleeding: yes

## 2020-11-05 NOTE — Progress Notes (Signed)
Dr. Kipp Brood notified that the pre-CABG dopplers have not been done.  Jodell Cipro

## 2020-11-05 NOTE — Progress Notes (Signed)
RT note:  Pt failed weaning due to apnea and per ABG. RT will retry weaning per protocol. RT will continue to monitor.

## 2020-11-05 NOTE — Op Note (Signed)
[address]       [place] [address]             [phone]                                          11/05/2020 Patient:  Bethel Deery Pre-Op Dx: Left main/three-vessel coronary artery disease   Hypertension   Obesity   Hyperlipidemia Post-op Dx: Same Procedure: CABG X 3.  LIMA to LAD, reverse saphenous vein graft to PDA, reverse saphenous vein graft to OM 2. Endoscopic greater saphenous vein harvest on the right and left legs   Surgeon and Role:      * Orpha Dain, Lucile Crater, MD - Primary    *M. Roddenberry, PA-C- assisting  Anesthesia  general EBL:  523m Blood Administration: none Xclamp Time:  57 min Pump Time:  1080m  Drains: 1954 blake drain: R, L, mediastinal  Wires: ventricular Counts: correct   Indications: [age]ear old male with left main/three-vessel coronary disease who is admitted following an elective left heart cath.  We discussed the risks and benefits of surgical revascularization and he is agreeable to proceed.  He is scheduled for 11/05/2020.  Carotids from 2021 were reviewed.  He had no significant disease at that time.  Findings: Good LIMA, good vein graft.  Small calcified LAD.  Small PDA.  Heavily calcified obtuse marginal.  Small caliber.  Operative Technique: All invasive lines were placed in pre-op holding.  After the risks, benefits and alternatives were thoroughly discussed, the patient was brought to the operative theatre.  Anesthesia was induced, and the patient was prepped and draped in normal sterile fashion.  An appropriate surgical pause was performed, and pre-operative antibiotics were dosed accordingly.  We began with simultaneous incisions along the right leg for harvesting of the greater saphenous vein and the chest for the sternotomy.  In regards to the sternotomy, this was carried down with bovie cautery, and the sternum was divided with a reciprocating saw.  Meticulous hemostasis was obtained.  The left  internal thoracic artery was exposed and harvested in in pedicled fashion.  The patient was systemically heparinized, and the artery was divided distally, and placed in a papaverine sponge.    The sternal elevator was removed, and a retractor was placed.  The pericardium was divided in the midline and fashioned into a cradle with pericardial stitches.   After we confirmed an appropriate ACT, the ascending aorta was cannulated in standard fashion.  The right atrial appendage was used for venous cannulation site.  Cardiopulmonary bypass was initiated, and the heart retractor was placed. The cross clamp was applied, and a dose of anterograde cardioplegia was given with good arrest of the heart.  We moved to the posterior wall of the heart, and found a good target on the PDA.  An arteriotomy was made, and the vein graft was anastomosed to it in an end to side fashion.  Next we exposed the lateral wall, and found a good target on the OM 2.  An end to side anastomosis with the vein graft was then created.  An arteriotomy was created.  The vein was anastomosed in an end to side fashion.  Finally, we exposed a good target on the LAD, and fashioned an end to side anastomosis between it and the LITA.  We began to re-warm, and a  re-animation dose of cardioplegia was given.  The heart was de-aired, and the cross clamp was removed.  Meticulous hemostasis was obtained.    A partial occludding clamp was then placed on the ascending aorta, and we created an end to side anastomosis between it and the proximal vein grafts.  The proximal sites were marked with rings.  Hemostasis was obtained, and we separated from cardiopulmonary bypass without event.  The heparin was reversed with protamine.  Chest tubes and wires were placed, and the sternum was re-approximated with sternal wires.  The soft tissue and skin were re-approximated wth absorbable suture.    The patient tolerated the procedure without any immediate complications,  and was transferred to the ICU in guarded condition.  Sakoya Win Bary Leriche

## 2020-11-05 NOTE — Progress Notes (Signed)
  Echocardiogram Echocardiogram Transesophageal has been performed.  Hayden Deleon 11/05/2020, 8:52 AM

## 2020-11-05 NOTE — Anesthesia Procedure Notes (Signed)
Central Venous Catheter Insertion Performed by: Roderic Palau, MD, anesthesiologist Start/End8/09/2020 7:10 AM, 11/05/2020 7:25 AM Patient location: Pre-op. Preanesthetic checklist: patient identified, IV checked, site marked, risks and benefits discussed, surgical consent, monitors and equipment checked, pre-op evaluation, timeout performed and anesthesia consent Position: Trendelenburg Lidocaine 1% used for infiltration and patient sedated Hand hygiene performed , maximum sterile barriers used  and Seldinger technique used Catheter size: 8.5 Fr Total catheter length 10. Central line was placed.Sheath introducer Procedure performed using ultrasound guided technique. Ultrasound Notes:anatomy identified, needle tip was noted to be adjacent to the nerve/plexus identified, no ultrasound evidence of intravascular and/or intraneural injection and image(s) printed for medical record Attempts: 1 Following insertion, line sutured, dressing applied and Biopatch. Post procedure assessment: blood return through all ports, free fluid flow and no air  Patient tolerated the procedure well with no immediate complications. Additional procedure comments: Triple lumen slick catheter placed through the swan port.[name]

## 2020-11-05 NOTE — Anesthesia Postprocedure Evaluation (Signed)
Anesthesia Post Note  Patient: Hayden Deleon  Procedure(s) Performed: CORONARY ARTERY BYPASS GRAFTING (CABG) TIMES 3, USING LEFT INTERNAL MAMMARY ARTERY AND GREATER SAPHEOUS VEIN HARVESTED ENDOSCOPICALLY LEFT AND RIGHT LEGS (Chest) APPLICATION OF CELL SAVER     Patient location during evaluation: SICU Anesthesia Type: General Level of consciousness: sedated Pain management: pain level controlled Vital Signs Assessment: post-procedure vital signs reviewed and stable Respiratory status: patient remains intubated per anesthesia plan Cardiovascular status: stable Postop Assessment: no apparent nausea or vomiting Anesthetic complications: no   No notable events documented.  Last Vitals:  Vitals:   11/05/20 1527 11/05/20 1625  BP: (!) 146/71 133/61  Pulse: (!) 58 62  Resp: 16 20  Temp:    SpO2: 95% 93%    Last Pain:  Vitals:   11/05/20 1600  TempSrc:   PainSc: 0-No pain                 Jameah Rouser,W. EDMOND

## 2020-11-06 ENCOUNTER — Inpatient Hospital Stay (HOSPITAL_COMMUNITY): Payer: BC Managed Care – PPO

## 2020-11-06 ENCOUNTER — Encounter: Payer: Self-pay | Admitting: Cardiovascular Disease

## 2020-11-06 ENCOUNTER — Telehealth: Payer: Self-pay | Admitting: *Deleted

## 2020-11-06 DIAGNOSIS — I200 Unstable angina: Secondary | ICD-10-CM | POA: Diagnosis not present

## 2020-11-06 LAB — BASIC METABOLIC PANEL
Anion gap: 9 (ref 5–15)
Anion gap: 8 (ref 5–15)
BUN: 11 mg/dL (ref 8–23)
BUN: 15 mg/dL (ref 8–23)
CO2: 20 mmol/L — ABNORMAL LOW (ref 22–32)
CO2: 21 mmol/L — ABNORMAL LOW (ref 22–32)
Calcium: 7.9 mg/dL — ABNORMAL LOW (ref 8.9–10.3)
Calcium: 8.4 mg/dL — ABNORMAL LOW (ref 8.9–10.3)
Chloride: 109 mmol/L (ref 98–111)
Chloride: 106 mmol/L (ref 98–111)
Creatinine, Ser: 0.69 mg/dL (ref 0.61–1.24)
Creatinine, Ser: 1.09 mg/dL (ref 0.61–1.24)
GFR, Estimated: >60 mL/min (ref >60)
GFR, Estimated: >60 mL/min (ref >60)
Glucose, Bld: 139 mg/dL — ABNORMAL HIGH (ref 70–99)
Glucose, Bld: 133 mg/dL — ABNORMAL HIGH (ref 70–99)
Potassium: 4.3 mmol/L (ref 3.5–5.1)
Potassium: 4 mmol/L (ref 3.5–5.1)
Sodium: 138 mmol/L (ref 135–145)
Sodium: 135 mmol/L (ref 135–145)

## 2020-11-06 LAB — ECHOCARDIOGRAM COMPLETE
AR max vel: 2.41 cm2
AV Area VTI: 2.15 cm2
AV Area mean vel: 2.14 cm2
AV Mean grad: 8 mmHg
AV Peak grad: 12 mmHg
Ao pk vel: 1.73 m/s
Area-P 1/2: 3.42 cm2
Height: 71 in
MV VTI: 3.29 cm2
S' Lateral: 3 cm
Weight: 3496 oz

## 2020-11-06 LAB — POCT I-STAT 7, (LYTES, BLD GAS, ICA,H+H)
Acid-base deficit: 5 mmol/L — ABNORMAL HIGH (ref 0.0–2.0)
Acid-base deficit: 5 mmol/L — ABNORMAL HIGH (ref 0.0–2.0)
Bicarbonate: 19.8 mmol/L — ABNORMAL LOW (ref 20.0–28.0)
Bicarbonate: 19.9 mmol/L — ABNORMAL LOW (ref 20.0–28.0)
Calcium, Ion: 1.16 mmol/L (ref 1.15–1.40)
Calcium, Ion: 1.15 mmol/L (ref 1.15–1.40)
HCT: 25 % — ABNORMAL LOW (ref 39.0–52.0)
HCT: 26 % — ABNORMAL LOW (ref 39.0–52.0)
Hemoglobin: 8.5 g/dL — ABNORMAL LOW (ref 13.0–17.0)
Hemoglobin: 8.8 g/dL — ABNORMAL LOW (ref 13.0–17.0)
O2 Saturation: 95 %
O2 Saturation: 89 %
Patient temperature: 37
Patient temperature: 37.2
Potassium: 4 mmol/L (ref 3.5–5.1)
Potassium: 4.1 mmol/L (ref 3.5–5.1)
Sodium: 142 mmol/L (ref 135–145)
Sodium: 141 mmol/L (ref 135–145)
TCO2: 21 mmol/L — ABNORMAL LOW (ref 22–32)
TCO2: 21 mmol/L — ABNORMAL LOW (ref 22–32)
pCO2 arterial: 32.8 mmHg (ref 32.0–48.0)
pCO2 arterial: 35.2 mmHg (ref 32.0–48.0)
pH, Arterial: 7.389 (ref 7.350–7.450)
pH, Arterial: 7.362 (ref 7.350–7.450)
pO2, Arterial: 74 mmHg — ABNORMAL LOW (ref 83.0–108.0)
pO2, Arterial: 60 mmHg — ABNORMAL LOW (ref 83.0–108.0)

## 2020-11-06 LAB — GLUCOSE, CAPILLARY
Glucose-Capillary: 108 mg/dL — ABNORMAL HIGH (ref 70–99)
Glucose-Capillary: 109 mg/dL — ABNORMAL HIGH (ref 70–99)
Glucose-Capillary: 109 mg/dL — ABNORMAL HIGH (ref 70–99)
Glucose-Capillary: 112 mg/dL — ABNORMAL HIGH (ref 70–99)
Glucose-Capillary: 121 mg/dL — ABNORMAL HIGH (ref 70–99)
Glucose-Capillary: 125 mg/dL — ABNORMAL HIGH (ref 70–99)
Glucose-Capillary: 130 mg/dL — ABNORMAL HIGH (ref 70–99)
Glucose-Capillary: 135 mg/dL — ABNORMAL HIGH (ref 70–99)
Glucose-Capillary: 137 mg/dL — ABNORMAL HIGH (ref 70–99)

## 2020-11-06 LAB — CBC
HCT: 29.1 % — ABNORMAL LOW (ref 39.0–52.0)
HCT: 27.8 % — ABNORMAL LOW (ref 39.0–52.0)
Hemoglobin: 9.8 g/dL — ABNORMAL LOW (ref 13.0–17.0)
Hemoglobin: 9.6 g/dL — ABNORMAL LOW (ref 13.0–17.0)
MCH: 33.1 pg (ref 26.0–34.0)
MCH: 33.6 pg (ref 26.0–34.0)
MCHC: 33.7 g/dL (ref 30.0–36.0)
MCHC: 34.5 g/dL (ref 30.0–36.0)
MCV: 98.3 fL (ref 80.0–100.0)
MCV: 97.2 fL (ref 80.0–100.0)
Platelets: 125 10*3/uL — ABNORMAL LOW (ref 150–400)
Platelets: 98 10*3/uL — ABNORMAL LOW (ref 150–400)
RBC: 2.96 MIL/uL — ABNORMAL LOW (ref 4.22–5.81)
RBC: 2.86 MIL/uL — ABNORMAL LOW (ref 4.22–5.81)
RDW: 13.3 % (ref 11.5–15.5)
RDW: 13.4 % (ref 11.5–15.5)
WBC: 13 10*3/uL — ABNORMAL HIGH (ref 4.0–10.5)
WBC: 10.7 10*3/uL — ABNORMAL HIGH (ref 4.0–10.5)
nRBC: 0 % (ref 0.0–0.2)
nRBC: 0 % (ref 0.0–0.2)

## 2020-11-06 LAB — MAGNESIUM
Magnesium: 2.5 mg/dL — ABNORMAL HIGH (ref 1.7–2.4)
Magnesium: 2.4 mg/dL (ref 1.7–2.4)

## 2020-11-06 LAB — ABO/RH: ABO/RH(D): A NEG

## 2020-11-06 MED ORDER — INSULIN ASPART 100 UNIT/ML IJ SOLN: 0.0000 [IU] | INTRAMUSCULAR | Status: DC

## 2020-11-06 MED ORDER — UMECLIDINIUM BROMIDE 62.5 MCG/INH IN AEPB
1.0000 | INHALATION_SPRAY | Freq: Every day | RESPIRATORY_TRACT | Status: DC
  Administered 2020-11-06 – 2020-11-10 (×5): 1 via RESPIRATORY_TRACT
  Filled 2020-11-06: qty 7

## 2020-11-06 MED ORDER — INSULIN ASPART 100 UNIT/ML IJ SOLN
0.0000 [IU] | INTRAMUSCULAR | Status: DC
  Administered 2020-11-06 – 2020-11-07 (×5): 2 [IU] via SUBCUTANEOUS

## 2020-11-06 MED ORDER — ENOXAPARIN SODIUM 40 MG/0.4ML IJ SOSY
40.0000 mg | PREFILLED_SYRINGE | Freq: Every day | INTRAMUSCULAR | Status: DC
  Administered 2020-11-06 – 2020-11-09 (×4): 40 mg via SUBCUTANEOUS
  Filled 2020-11-06 (×4): qty 0.4

## 2020-11-06 NOTE — Progress Notes (Signed)
      [address]       [place] [address]             [phone]      POD # 1 CABG x 3  Up in chair  BP (!) 121/55   Pulse 64   Temp 99.4 F (37.4 C) (Oral)   Resp (!) 21   Ht '5\' 11"'$  (1.803 m)   Wt 106.8 kg   SpO2 93%   BMI 32.84 kg/m  Off norepi 6L [place] 94% sat  Intake/Output Summary (Last 24 hours) at 11/06/2020 1821 Last data filed at 11/06/2020 1800 Gross per 24 hour  Intake 1434.96 ml  Output 1270 ml  Net 164.96 ml   K = 4.0, creatinine 1.09 Hct = 28  Doing well POD # 1  Allea Kassner C. Roxan Hockey, MD [facility] [phone]

## 2020-11-06 NOTE — Hospital Course (Addendum)
  Primary Care: Libby Maw, MD Primary Cardiologist:Muhammad Fletcher Anon, MD       History of Present Illness:     65 year old male transferred from [facility] after undergoing elective left heart cath which showed severe left main/three-vessel coronary artery disease.  The patient states that he has been having exertional angina that is progressed over the last year.  He underwent a stress test in January of this year which was abnormal.  He currently is chest pain-free. He denies any lower extremity swelling, orthopnea, or shortness of breath at rest. We discussed the risks and benefits of surgical revascularization and he is agreeable to proceed.  He is scheduled for 11/05/2020.  Carotids from 2021 were reviewed.  He had no significant disease at that time.  Hospital Course: Mr. Nishiyama remained stable following left heart catheterization.  He was taken to the operating room on 11/05/2020 where CABG x3 was carried out without complication.  Saphenous vein was harvested endoscopically from both thighs.  The left internal mammary artery was grafted to the left anterior descending coronary artery.  Saphenous veins were grafted to the obtuse marginal and right coronary arteries.  Following the procedure, he transferred to the surgical ICU in stable condition on Levophed.  He remained hemodynamically stable.  Ventilator support continued until the morning of the first postoperative day he was extubated uneventfully.  Vasoactive medications were weaned.  Monitoring lines were removed and he was mobilized protocol. Chest tubes were removed on post op day 2. Patient was started on Lopressor 08/09. He was volume overloaded and diuresed accordingly. He had expected post op blood loss anemia. He did not require a post op transfusion. Last H and H was **. He also had thrombocytopenia. His last platelet count was up to **. He was weaned off the Insulin drip. His HGA1C pre op was 5.7. He will  be provided nutrition information with discharge instructions. Accu checks and SS PRN will be stopped after transfer. Patient was felt surgically stable for transfer to 4E on 08/09.

## 2020-11-06 NOTE — Progress Notes (Signed)
RT note. RT informed by RN that patient wears CPAP at home. Patient stated he only wants a cpap mask with nasal pillows. Offered patient a nasal mask and patient still refused. Patient currently on [place] sat 96%, RT will continue to monitor.

## 2020-11-06 NOTE — Progress Notes (Signed)
      [address]       [place] [address]             [phone]                 1 Day Post-Op Procedure(s) (LRB): CORONARY ARTERY BYPASS GRAFTING (CABG) TIMES 3, USING LEFT INTERNAL MAMMARY ARTERY AND GREATER SAPHEOUS VEIN HARVESTED ENDOSCOPICALLY LEFT AND RIGHT LEGS (N/A) APPLICATION OF CELL SAVER   Events: No events Extubated this am _______________________________________________________________ Vitals: BP (!) 138/55   Pulse 60   Temp 98.2 F (36.8 C)   Resp (!) 22   Ht '5\' 11"'$  (1.803 m)   Wt 106.8 kg   SpO2 95%   BMI 32.84 kg/m   - Neuro: alert NAd  - Cardiovascular: sinus brad  Drips: levo 3.   CVP:  [7 mmHg-27 mmHg] 12 mmHg CO:  [4 L/min-8.8 L/min] 8.2 L/min  - Pulm: EWOB   ABG    Component Value Date/Time   PHART 7.389 11/06/2020 0854   PCO2ART 32.8 11/06/2020 0854   PO2ART 74 (L) 11/06/2020 0854   HCO3 19.8 (L) 11/06/2020 0854   TCO2 21 (L) 11/06/2020 0854   ACIDBASEDEF 5.0 (H) 11/06/2020 0854   O2SAT 95.0 11/06/2020 0854    - Abd: ND - Extremity: warm  .Intake/Output      08/07 0701 08/08 0700 08/08 0701 08/09 0700   P.O.     I.V. (mL/kg) 3466.7 (32.5) 87.2 (0.8)   Blood 286    IV Piggyback 2747.5    Total Intake(mL/kg) 6500.1 (60.9) 87.2 (0.8)   Urine (mL/kg/hr) 5250 (2)    Blood 419    Chest Tube 420    Total Output 6089    Net +411.1 +87.2           _______________________________________________________________ Labs: CBC Latest Ref Rng & Units 11/06/2020 11/06/2020 11/05/2020  WBC 4.0 - 10.5 K/uL - 13.0(H) -  Hemoglobin 13.0 - 17.0 g/dL 8.5(L) 9.8(L) 8.8(L)  Hematocrit 39.0 - 52.0 % 25.0(L) 29.1(L) 26.0(L)  Platelets 150 - 400 K/uL - 125(L) -   CMP Latest Ref Rng & Units 11/06/2020 11/06/2020 11/05/2020  Glucose 70 - 99 mg/dL - 139(H) -  BUN 8 - 23 mg/dL - 11 -  Creatinine 0.61 - 1.24 mg/dL - 0.69 -  Sodium 135 - 145 mmol/L 142 138 143  Potassium 3.5 - 5.1 mmol/L 4.0 4.3 4.3  Chloride 98 - 111 mmol/L - 109 -   CO2 22 - 32 mmol/L - 20(L) -  Calcium 8.9 - 10.3 mg/dL - 7.9(L) -  Total Protein 6.5 - 8.1 g/dL - - -  Total Bilirubin 0.3 - 1.2 mg/dL - - -  Alkaline Phos 38 - 126 U/L - - -  AST 15 - 41 U/L - - -  ALT 0 - 44 U/L - - -    CXR: Small L pneumoT PV congestion  _______________________________________________________________  Assessment and Plan: POD 1 s/p CABG  Neuro: pain controlled CV: on levo.  Good hemodynamics.  Will wean levo as tolerated.  Hold BB.  On A/S.   Pulm: extubated this am.  Continue pulm hygiene.  Will keep CTs Renal: good uop.  Creat stable. GI: advancing diet Heme: stable ID: [email or web address] Endo: SSI Dispo: continue ICU care   Hayden Deleon O Hayden Deleon 11/06/2020 11:14 AM

## 2020-11-06 NOTE — Telephone Encounter (Signed)
Pt currently admitted. Scheduled with Ignacia Bayley, NP at 11/16/20.  Will follow up with pt for [facility] call after hospital discharge.

## 2020-11-06 NOTE — Telephone Encounter (Signed)
-----   Message from Abigail Butts, PA-C sent at 11/06/2020  1:42 PM EDT ----- Regarding: TOC call Hey there! This patient is scheduled to see Ignacia Bayley 11/16/20 at 10:30am. Can you make sure he gets a TOC call following discharge in anticipation of this visit? Thanks!

## 2020-11-06 NOTE — Procedures (Signed)
Extubation Procedure Note  Patient Details:   Name: Hayden Deleon DOB: 11-May-1955 MRN: [id number]   Airway Documentation:    Vent end date: 11/06/20 Vent end time: 0905   Evaluation  O2 sats: stable throughout Complications: No apparent complications Patient did tolerate procedure well. Bilateral Breath Sounds: Clear, Diminished   Yes  Patient was extubated to a 4L [place] without any complications, dyspnea or stridor noted. NIF: greater than -40, VC: 1.1L, positive cuff leak prior to extubation.   Hayden Deleon L 11/06/2020, 9:05 AM

## 2020-11-06 NOTE — Progress Notes (Signed)
Progress Note  Patient Name: Hayden Deleon Date of Encounter: 11/06/2020  Primary Cardiologist: [name], MD   Subjective   Intubated but on SBT and doing well.  Follows all commands.  Inpatient Medications    Scheduled Meds:  acetaminophen  1,000 mg Oral Q6H   Or   acetaminophen (TYLENOL) oral liquid 160 mg/5 mL  1,000 mg Per Tube Q6H   aspirin EC  325 mg Oral Daily   Or   aspirin  324 mg Per Tube Daily   atorvastatin  80 mg Oral Daily   bisacodyl  10 mg Oral Daily   Or   bisacodyl  10 mg Rectal Daily   chlorhexidine gluconate (MEDLINE KIT)  15 mL Mouth Rinse BID   Chlorhexidine Gluconate Cloth  6 each Topical Daily   docusate sodium  200 mg Oral Daily   fluticasone furoate-vilanterol  1 puff Inhalation Daily   insulin aspart  0-24 Units Subcutaneous Q4H   mouth rinse  15 mL Mouth Rinse 10 times per day   metoprolol tartrate  12.5 mg Oral BID   Or   metoprolol tartrate  12.5 mg Per Tube BID   [START ON 11/07/2020] pantoprazole  40 mg Oral Daily   sodium chloride flush  3 mL Intravenous Q12H   Continuous Infusions:  sodium chloride 20 mL/hr at 11/06/20 0600   sodium chloride     sodium chloride      ceFAZolin (ANCEF) IV Stopped (11/06/20 0420)   dexmedetomidine (PRECEDEX) IV infusion 0.7 mcg/kg/hr (11/06/20 0600)   lactated ringers     lactated ringers     lactated ringers 20 mL/hr at 11/06/20 0600   niCARDipine Stopped (11/05/20 2027)   norepinephrine (LEVOPHED) Adult infusion 2 mcg/min (11/06/20 0600)   PRN Meds: sodium chloride, lactated ringers, metoprolol tartrate, midazolam, morphine injection, ondansetron (ZOFRAN) IV, oxyCODONE, sodium chloride flush, traMADol   Vital Signs    Vitals:   11/06/20 0515 11/06/20 0530 11/06/20 0545 11/06/20 0600  BP:      Pulse: (!) 51 (!) 51 (!) 52 (!) 51  Resp: _0 Temp: 99 F (37.2 C) 99 F (37.2 C) 99 F (37.2 C) 98.8 F (37.1 C)  TempSrc:      SpO2: 99% 99% 99% 98%  Weight:      Height:         Intake/Output Summary (Last 24 hours) at 11/06/2020 0740 Last data filed at 11/06/2020 0600 Gross per 24 hour  Intake 6500.12 ml  Output 6089 ml  Net 411.12 ml   Filed Weights   11/03/20 1647 11/06/20 0500  Weight: 99.1 kg 106.8 kg    Telemetry    Sinus bradycardia with rare PVCs; no pauses - Personally Reviewed  ECG    Sinus bradycardia rate 52 11/06/20 - Personally Reviewed  Physical Exam   GEN: No acute distress.   Neck: No JVD, CVL c/d/i Cardiac: RRR, no murmurs, rubs, or gallops.  Respiratory: Clear to auscultation bilaterally with decreased breath sounds GI: Soft, nontender, non-distended  MS: No edema; No deformity. Neuro:  Nonfocal  Psych: non-verbally responds appropriately to questions  Labs    Chemistry Recent Labs  Lab 11/04/20 0320 11/05/20 0524 11/05/20 0815 11/05/20 1213 11/05/20 1216 11/05/20 1941 11/05/20 2059 11/06/20 0420  NA 139 141   < > 144   < > 140 143 138  K 3.8 3.8   < > 4.4   < > 4.8 4.3 4.3  CL 107 107   < >  109  --  110  --  109  CO2 26 28  --   --   --  24  --  20*  GLUCOSE 95 102*   < > 105*  --  130*  --  139*  BUN 8 10   < > 8  --  9  --  11  CREATININE 0.98 1.05   < > 0.70  --  0.93  --  0.69  CALCIUM 9.1 9.2  --   --   --  7.8*  --  7.9*  PROT 6.1*  --   --   --   --   --   --   --   ALBUMIN 3.3*  --   --   --   --   --   --   --   AST 17  --   --   --   --   --   --   --   ALT 19  --   --   --   --   --   --   --   ALKPHOS 50  --   --   --   --   --   --   --   BILITOT 0.9  --   --   --   --   --   --   --   GFRNONAA >60 >60  --   --   --  >60  --  >60  ANIONGAP 6 6  --   --   --  6  --  9   < > = values in this interval not displayed.     Hematology Recent Labs  Lab 11/05/20 1340 11/05/20 1452 11/05/20 1941 11/05/20 2059 11/06/20 0420  WBC 10.8*  --  18.0*  --  13.0*  RBC 3.18*  --  3.07*  --  2.96*  HGB 9.5*  10.4*   < > 10.1* 8.8* 9.8*  HCT 28.0*  31.1*   < > 30.5* 26.0* 29.1*  MCV 97.8  --  99.3  --   98.3  MCH 32.7  --  32.9  --  33.1  MCHC 33.4  --  33.1  --  33.7  RDW 12.9  --  13.2  --  13.3  PLT 109*  --  152  --  125*   < > = values in this interval not displayed.    Cardiac EnzymesNo results for input(s): TROPONINI in the last 168 hours. No results for input(s): TROPIPOC in the last 168 hours.   BNPNo results for input(s): BNP, PROBNP in the last 168 hours.   DDimer No results for input(s): DDIMER in the last 168 hours.   Radiology    DG Chest 2 View  Result Date: 11/04/2020 CLINICAL DATA:  Preop evaluation for upcoming cardiac surgery EXAM: CHEST - 2 VIEW COMPARISON:  08/20/2016 FINDINGS: Cardiac shadow is stable. Aortic calcifications are again seen. The lungs are well aerated bilaterally. No focal infiltrate or sizable effusion is seen. No acute bony abnormality is noted. Postsurgical changes in the right shoulder are seen. IMPRESSION: No acute abnormality noted. Electronically Signed   By: Inez Catalina M.D.   On: 11/04/2020 19:36   DG Chest Port 1 View  Result Date: 11/05/2020 CLINICAL DATA:  Prior CABG x3 EXAM: PORTABLE CHEST 1 VIEW COMPARISON:  11/04/2020 FINDINGS: Interval median sternotomy 2.5 centimeters above the carina. Endotracheal tube. Nasogastric tube is in place, tip beyond  the edge of the image. Patient's RIGHT IJ central line, tip overlying the region of the superior vena cava. Mediastinal drains are in place. Heart size is normal. There is bibasilar atelectasis. Possible RIGHT trace apical pneumothorax. Pleural thickening noted at the LEFT lung apex. IMPRESSION: Postoperative changes. Bilateral atelectasis. Suspect small RIGHT apical pneumothorax. Electronically Signed   By: Nolon Nations M.D.   On: 11/05/2020 14:09   ECHOCARDIOGRAM COMPLETE  Result Date: 11/06/2020    ECHOCARDIOGRAM REPORT   Patient Name:   Hayden Deleon Date of Exam: 11/04/2020 Medical Rec #:  [id number]         Height:       71.0 in Accession #:    [id number]        Weight:       218.5 lb  Date of Birth:  [birth year]          BSA:          2.189 m Patient Age:    65 years          BP:           147/74 mmHg Patient Gender: M                 HR:           54 bpm. Exam Location:  Inpatient Procedure: 2D Echo, Color Doppler, Cardiac Doppler and Intracardiac            Opacification Agent                      STAT ECHO Reported to: Dr Dorris Carnes on 11/04/2020 11:14:00 AM. Indications:     Acute Ischemic Heart Disease  History:         Patient has prior history of Echocardiogram examinations, most                  recent 03/05/2018. CAD; Risk Factors:Former Smoker, Hypertension                  and Dyslipidemia.  Sonographer:     Clayton Lefort RDCS (AE) Referring Phys:  [id number] Darreld Mclean Diagnosing Phys: Dorris Carnes MD  Sonographer Comments: No subcostal window, Technically difficult study due to poor echo windows, suboptimal parasternal window, suboptimal apical window and patient is morbidly obese. IMPRESSIONS  1. Left ventricular ejection fraction, by estimation, is 70 to 75%. The left ventricle has hyperdynamic function. The left ventricle has no regional wall motion abnormalities. There is moderate left ventricular hypertrophy. Left ventricular diastolic parameters were normal.  2. Right ventricular systolic function is normal. The right ventricular size is normal.  3. Right atrial size was mildly dilated.  4. Trivial mitral valve regurgitation.  5. The aortic valve is normal in structure. Aortic valve regurgitation is not visualized.  6. There is mild dilatation of the ascending aorta, measuring 40 mm. FINDINGS  Left Ventricle: Left ventricular ejection fraction, by estimation, is 70 to 75%. The left ventricle has hyperdynamic function. The left ventricle has no regional wall motion abnormalities. Definity contrast agent was given IV to delineate the left ventricular endocardial borders. The left ventricular internal cavity size was normal in size. There is moderate left ventricular hypertrophy. Left  ventricular diastolic parameters were normal. Right Ventricle: The right ventricular size is normal. Right vetricular wall thickness was not assessed. Right ventricular systolic function is normal. Left Atrium: Left atrial size was normal in size. Right Atrium: Right atrial size was mildly  dilated. Pericardium: There is no evidence of pericardial effusion. Mitral Valve: Systolic anterior motion of the anterior mitral leaflet. There is mild thickening of the mitral valve leaflet(s). Mild mitral annular calcification. Trivial mitral valve regurgitation. MV peak gradient, 2.8 mmHg. The mean mitral valve gradient is 1.0 mmHg. Tricuspid Valve: The tricuspid valve is normal in structure. Tricuspid valve regurgitation is trivial. Aortic Valve: The aortic valve is normal in structure. Aortic valve regurgitation is not visualized. Aortic valve mean gradient measures 8.0 mmHg. Aortic valve peak gradient measures 12.0 mmHg. Aortic valve area, by VTI measures 2.15 cm. Pulmonic Valve: The pulmonic valve was not well visualized. Pulmonic valve regurgitation is not visualized. Aorta: The aortic root is normal in size and structure. There is mild dilatation of the ascending aorta, measuring 40 mm. IAS/Shunts: No atrial level shunt detected by color flow Doppler.  LEFT VENTRICLE PLAX 2D LVIDd:         3.70 cm  Diastology LVIDs:         3.00 cm  LV e' medial:    8.81 cm/s LV PW:         1.50 cm  LV E/e' medial:  9.5 LV IVS:        1.50 cm  LV e' lateral:   11.00 cm/s LVOT diam:     1.90 cm  LV E/e' lateral: 7.6 LV SV:         100 LV SV Index:   46 LVOT Area:     2.84 cm  RIGHT VENTRICLE RV Basal diam:  2.90 cm RV S prime:     13.20 cm/s TAPSE (M-mode): 2.9 cm LEFT ATRIUM             Index       RIGHT ATRIUM           Index LA diam:        3.30 cm 1.51 cm/m  RA Area:     20.50 cm LA Vol (A2C):   67.1 ml 30.65 ml/m RA Volume:   60.30 ml  27.54 ml/m LA Vol (A4C):   37.4 ml 17.08 ml/m LA Biplane Vol: 51.3 ml 23.43 ml/m  AORTIC  VALVE AV Area (Vmax):    2.41 cm AV Area (Vmean):   2.14 cm AV Area (VTI):     2.15 cm AV Vmax:           173.00 cm/s AV Vmean:          134.000 cm/s AV VTI:            0.464 m AV Peak Grad:      12.0 mmHg AV Mean Grad:      8.0 mmHg LVOT Vmax:         147.00 cm/s LVOT Vmean:        101.000 cm/s LVOT VTI:          0.352 m LVOT/AV VTI ratio: 0.76  AORTA Ao Root diam: 3.60 cm Ao Asc diam:  4.00 cm MITRAL VALVE MV Area (PHT): 3.42 cm    SHUNTS MV Area VTI:   3.29 cm    Systemic VTI:  0.35 m MV Peak grad:  2.8 mmHg    Systemic Diam: 1.90 cm MV Mean grad:  1.0 mmHg MV Vmax:       0.83 m/s MV Vmean:      46.2 cm/s MV Decel Time: 222 msec MV E velocity: 83.30 cm/s MV A velocity: 69.70 cm/s MV E/A ratio:  1.20 Dorris Carnes  MD Electronically signed by Dorris Carnes MD Signature Date/Time: 11/04/2020/1:12:48 PM    Final (Updated)    ECHOCARDIOGRAM COMPLETE  Result Date: 11/04/2020    ECHOCARDIOGRAM REPORT   Patient Name:   Tama Gander Justo Date of Exam: 11/04/2020 Medical Rec #:  [id number]         Height:       71.0 in Accession #:    [id number]        Weight:       218.5 lb Date of Birth:  1955-05-14          BSA:          2.189 m Patient Age:    75 years          BP:           147/74 mmHg Patient Gender: M                 HR:           54 bpm. Exam Location:  Inpatient Procedure: 2D Echo, Color Doppler, Cardiac Doppler and Intracardiac            Opacification Agent                      STAT ECHO Reported to: Dr Dorris Carnes on 11/04/2020 11:14:00 AM. Indications:    Acute Ischemic Heart Disease  History:        Patient has prior history of Echocardiogram examinations, most                 recent 03/05/2018. CAD; Risk Factors:Former Smoker, Hypertension                 and Dyslipidemia.  Sonographer:    Clayton Lefort RDCS (AE) Referring Phys: [id number] Darreld Mclean  Sonographer Comments: No subcostal window, Technically difficult study due to poor echo windows, suboptimal parasternal window, suboptimal apical window and patient  is morbidly obese. IMPRESSIONS  1. Left ventricular ejection fraction, by estimation, is 70 to 75%. The left ventricle has hyperdynamic function. The left ventricle has no regional wall motion abnormalities. There is moderate left ventricular hypertrophy. Left ventricular diastolic parameters were normal.  2. Right ventricular systolic function is normal. The right ventricular size is normal.  3. Right atrial size was mildly dilated.  4. Trivial mitral valve regurgitation.  5. The aortic valve is normal in structure. Aortic valve regurgitation is not visualized.  6. There is mild dilatation of the ascending aorta, measuring 40 mm. FINDINGS  Left Ventricle: Left ventricular ejection fraction, by estimation, is 70 to 75%. The left ventricle has hyperdynamic function. The left ventricle has no regional wall motion abnormalities. Definity contrast agent was given IV to delineate the left ventricular endocardial borders. The left ventricular internal cavity size was normal in size. There is moderate left ventricular hypertrophy. Left ventricular diastolic parameters were normal. Right Ventricle: The right ventricular size is normal. Right vetricular wall thickness was not assessed. Right ventricular systolic function is normal. Left Atrium: Left atrial size was normal in size. Right Atrium: Right atrial size was mildly dilated. Pericardium: There is no evidence of pericardial effusion. Mitral Valve: Systolic anterior motion of the anterior mitral leaflet. There is mild thickening of the mitral valve leaflet(s). Mild mitral annular calcification. Trivial mitral valve regurgitation. MV peak gradient, 2.8 mmHg. The mean mitral valve gradient is 1.0 mmHg. Tricuspid Valve: The tricuspid valve is normal in structure. Tricuspid valve regurgitation is trivial. Aortic  Valve: The aortic valve is normal in structure. Aortic valve regurgitation is not visualized. Aortic valve mean gradient measures 8.0 mmHg. Aortic valve peak  gradient measures 12.0 mmHg. Aortic valve area, by VTI measures 2.15 cm. Pulmonic Valve: The pulmonic valve was not well visualized. Pulmonic valve regurgitation is not visualized. Aorta: The aortic root is normal in size and structure. There is mild dilatation of the ascending aorta, measuring 40 mm. IAS/Shunts: No atrial level shunt detected by color flow Doppler.  LEFT VENTRICLE PLAX 2D LVIDd:         3.70 cm  Diastology LVIDs:         3.00 cm  LV e' medial:    8.81 cm/s LV PW:         1.50 cm  LV E/e' medial:  9.5 LV IVS:        1.50 cm  LV e' lateral:   11.00 cm/s LVOT diam:     1.90 cm  LV E/e' lateral: 7.6 LV SV:         100 LV SV Index:   46 LVOT Area:     2.84 cm  RIGHT VENTRICLE RV Basal diam:  2.90 cm RV S prime:     13.20 cm/s TAPSE (M-mode): 2.9 cm LEFT ATRIUM             Index       RIGHT ATRIUM           Index LA diam:        3.30 cm 1.51 cm/m  RA Area:     20.50 cm LA Vol (A2C):   67.1 ml 30.65 ml/m RA Volume:   60.30 ml  27.54 ml/m LA Vol (A4C):   37.4 ml 17.08 ml/m LA Biplane Vol: 51.3 ml 23.43 ml/m  AORTIC VALVE AV Area (Vmax):    2.41 cm AV Area (Vmean):   2.14 cm AV Area (VTI):     2.15 cm AV Vmax:           173.00 cm/s AV Vmean:          134.000 cm/s AV VTI:            0.464 m AV Peak Grad:      12.0 mmHg AV Mean Grad:      8.0 mmHg LVOT Vmax:         147.00 cm/s LVOT Vmean:        101.000 cm/s LVOT VTI:          0.352 m LVOT/AV VTI ratio: 0.76  AORTA Ao Root diam: 3.60 cm Ao Asc diam:  4.00 cm MITRAL VALVE MV Area (PHT): 3.42 cm    SHUNTS MV Area VTI:   3.29 cm    Systemic VTI:  0.35 m MV Peak grad:  2.8 mmHg    Systemic Diam: 1.90 cm MV Mean grad:  1.0 mmHg MV Vmax:       0.83 m/s MV Vmean:      46.2 cm/s MV Decel Time: 222 msec MV E velocity: 83.30 cm/s MV A velocity: 69.70 cm/s MV E/A ratio:  1.20 Dorris Carnes MD Electronically signed by Dorris Carnes MD Signature Date/Time: 11/04/2020/1:12:48 PM    Final    ECHO INTRAOPERATIVE TEE  Result Date: 11/05/2020  *INTRAOPERATIVE  TRANSESOPHAGEAL REPORT *  Patient Name:   Tama Gander Chopra Date of Exam: 11/05/2020 Medical Rec #:  [id number]         Height:       71.0  in Accession #:    [id number]        Weight:       218.5 lb Date of Birth:  1955/12/10          BSA:          2.19 m Patient Age:    62 years          BP:           132/81 mmHg Patient Gender: M                 HR:           46 bpm. Exam Location:  Inpatient Transesophogeal exam was perform intraoperatively during surgical procedure. Patient was closely monitored under general anesthesia during the entirety of examination. Indications:     CABG Performing Phys: [id number] Lucile Crater LIGHTFOOT Diagnosing Phys: Roderic Palau MD Complications: No known complications during this procedure. POST-OP IMPRESSIONS Overall, there were no significant changes from pre-bypass. PRE-OP FINDINGS  Left Ventricle: The left ventricle has normal systolic function, with an ejection fraction of 60-65%. The cavity size was left ventricular size was not assessed. There is moderately increased left ventricular wall thickness. No evidence of left ventricular regional wall motion abnormalities. There is moderate asymmetric left ventricular hypertrophy of the basal-septal segment. Right Ventricle: The right ventricle has normal systolic function. The cavity was not assessed. There is no increase in right ventricular wall thickness. Left Atrium: Left atrial size was not assessed. No left atrial/left atrial appendage thrombus was detected. Right Atrium: Right atrial size was not assessed. Interatrial Septum: No atrial level shunt detected by color flow Doppler. Pericardium: There is no evidence of pericardial effusion. Mitral Valve: The mitral valve is normal in structure. Mitral valve regurgitation is trivial by color flow Doppler. The MR jet is centrally-directed. SAM noted in addition to septal hypertrophy. Tricuspid Valve: The tricuspid valve was normal in structure. Tricuspid valve regurgitation was not  visualized by color flow Doppler. Aortic Valve: The aortic valve is normal in structure. Aortic valve regurgitation was not visualized by color flow Doppler. Pulmonic Valve: The pulmonic valve was normal in structure. Pulmonic valve regurgitation is mild by color flow Doppler. +--------------+--------++ LEFT VENTRICLE         +--------------+--------++ PLAX 2D                +--------------+--------++ LVOT diam:    2.50 cm  +--------------+--------++ LVOT Area:    4.91 cm +--------------+--------++                        +--------------+--------++ +------------+------------++ AORTIC VALVE             +------------+------------++ LVOT Vmax:  174.00 cm/s  +------------+------------++ LVOT Vmean: 128.000 cm/s +------------+------------++ LVOT VTI:   0.346 m      +------------+------------++  +-------------+-------++ AORTA                +-------------+-------++ Ao Root diam:3.50 cm +-------------+-------++ Ao Asc diam: 3.80 cm +-------------+-------++  +--------------+-------+ SHUNTS                +--------------+-------+ Systemic VTI: 0.35 m  +--------------+-------+ Systemic Diam:2.50 cm +--------------+-------+  Roderic Palau MD Electronically signed by Roderic Palau MD Signature Date/Time: 11/05/2020/4:36:43 PM    Final     Cardiac Studies   Left/Right Heart Catheterizations: Date: 11/03/20 Results:    Prox RCA to Mid RCA lesion is 99% stenosed.   1st Mrg lesion is 100% stenosed.  Prox LAD to Mid LAD lesion is 30% stenosed.   Ost LM lesion is 85% stenosed.   The left ventricular systolic function is normal.   LV end diastolic pressure is mildly elevated.   The left ventricular ejection fraction is 55-65% by visual estimate.   1.  Severe ostial left main stenosis and subtotal occlusion of the right coronary artery with left-to-right collaterals.  Chronically occluded OM1.  Significant pressure dampening with catheter engagement of the  left main.  The ostial left main stenosis is eccentric and seems to be due to a calcified plaque from the aorta with a slitlike origin. 2.  Normal LV systolic function and mildly elevated left ventricular end-diastolic pressure.   Recommendations: Recommend transfer to [facility] for evaluation of CABG.  The patient has been having chest pain with any level of exertion.  His symptoms are unstable and with current anatomy, I do not think it would be safe to do this as an outpatient. Recommend starting heparin drip at 6 PM.  Patient Profile     65 y.o. male HTN, HLD, CAS, Former Tobacco Use, OSA who presented with unstable angina found to have multi-vessel disease/ s/p CABG 11/05/20  Assessment & Plan    Coronary Artery Disease; Obstructive CAS HLD HTN - anatomy: LIMA->LAD, SVG->OM SVG->RCA - continue ASA 81 - continue atorvastatin 80 (up from home does of 40), goal LDL < 70 - Agree with planned decrease in metoprolol dose given pauses (hx of 50 BID, written for 12.5 mg PO BID per CT Surg Protocol and not given, still on 3 of levophed) - outpatient, will plan for 12.5 mg PO BID of metoprolol - on no Imdur with chart hx of grogginess    Will peripherally follow throughout course and engage as appropriate  For questions or updates, please contact [place] Please consult [email or web address] for contact info under Cardiology/STEMI.      Signed, Werner Lean, MD  11/06/2020, 7:40 AM

## 2020-11-07 ENCOUNTER — Inpatient Hospital Stay (HOSPITAL_COMMUNITY): Payer: BC Managed Care – PPO

## 2020-11-07 LAB — CBC
HCT: 27.3 % — ABNORMAL LOW (ref 39.0–52.0)
Hemoglobin: 9.1 g/dL — ABNORMAL LOW (ref 13.0–17.0)
MCH: 33.2 pg (ref 26.0–34.0)
MCHC: 33.3 g/dL (ref 30.0–36.0)
MCV: 99.6 fL (ref 80.0–100.0)
Platelets: 94 10*3/uL — ABNORMAL LOW (ref 150–400)
RBC: 2.74 MIL/uL — ABNORMAL LOW (ref 4.22–5.81)
RDW: 13.6 % (ref 11.5–15.5)
WBC: 11.2 10*3/uL — ABNORMAL HIGH (ref 4.0–10.5)
nRBC: 0 % (ref 0.0–0.2)

## 2020-11-07 LAB — BASIC METABOLIC PANEL
Anion gap: 9 (ref 5–15)
BUN: 13 mg/dL (ref 8–23)
CO2: 24 mmol/L (ref 22–32)
Calcium: 8.5 mg/dL — ABNORMAL LOW (ref 8.9–10.3)
Chloride: 102 mmol/L (ref 98–111)
Creatinine, Ser: 0.93 mg/dL (ref 0.61–1.24)
GFR, Estimated: >60 mL/min (ref >60)
Glucose, Bld: 128 mg/dL — ABNORMAL HIGH (ref 70–99)
Potassium: 3.7 mmol/L (ref 3.5–5.1)
Sodium: 135 mmol/L (ref 135–145)

## 2020-11-07 LAB — LIPID PANEL
Cholesterol: 85 mg/dL (ref 0–200)
HDL: 21 mg/dL — ABNORMAL LOW (ref >40)
LDL Cholesterol: 34 mg/dL (ref 0–99)
Total CHOL/HDL Ratio: 4 RATIO
Triglycerides: 150 mg/dL — ABNORMAL HIGH (ref <150)
VLDL: 30 mg/dL (ref 0–40)

## 2020-11-07 LAB — GLUCOSE, CAPILLARY
Glucose-Capillary: 117 mg/dL — ABNORMAL HIGH (ref 70–99)
Glucose-Capillary: 127 mg/dL — ABNORMAL HIGH (ref 70–99)
Glucose-Capillary: 96 mg/dL (ref 70–99)
Glucose-Capillary: 113 mg/dL — ABNORMAL HIGH (ref 70–99)
Glucose-Capillary: 131 mg/dL — ABNORMAL HIGH (ref 70–99)

## 2020-11-07 LAB — HEMOGLOBIN A1C
Hgb A1c MFr Bld: 5.6 % (ref 4.8–5.6)
Mean Plasma Glucose: 114.02 mg/dL

## 2020-11-07 MED ORDER — ~~LOC~~ CARDIAC SURGERY, PATIENT & FAMILY EDUCATION
Freq: Once | Status: AC
  Administered 2020-11-07: 1

## 2020-11-07 MED ORDER — SODIUM CHLORIDE 0.9% FLUSH
3.0000 mL | INTRAVENOUS | Status: DC | PRN
  Administered 2020-11-07 – 2020-11-09 (×2): 3 mL via INTRAVENOUS

## 2020-11-07 MED ORDER — SERTRALINE HCL 50 MG PO TABS
50.0000 mg | ORAL_TABLET | Freq: Every day | ORAL | Status: DC
  Administered 2020-11-07 – 2020-11-10 (×4): 50 mg via ORAL
  Filled 2020-11-07 (×4): qty 1

## 2020-11-07 MED ORDER — FUROSEMIDE 10 MG/ML IJ SOLN
40.0000 mg | Freq: Once | INTRAMUSCULAR | Status: AC
  Administered 2020-11-07: 40 mg via INTRAVENOUS
  Filled 2020-11-07: qty 4

## 2020-11-07 MED ORDER — SODIUM CHLORIDE 0.9% FLUSH
3.0000 mL | Freq: Two times a day (BID) | INTRAVENOUS | Status: DC
  Administered 2020-11-07 – 2020-11-09 (×2): 3 mL via INTRAVENOUS

## 2020-11-07 MED ORDER — POTASSIUM CHLORIDE 10 MEQ/50ML IV SOLN
10.0000 meq | INTRAVENOUS | Status: AC
  Administered 2020-11-07 (×3): 10 meq via INTRAVENOUS
  Filled 2020-11-07 (×3): qty 50

## 2020-11-07 MED ORDER — SODIUM CHLORIDE 0.9 % IV SOLN: 250.0000 mL | INTRAVENOUS | Status: DC | PRN

## 2020-11-07 MED ORDER — FUROSEMIDE 40 MG PO TABS
40.0000 mg | ORAL_TABLET | Freq: Every day | ORAL | Status: DC
  Administered 2020-11-08: 40 mg via ORAL
  Filled 2020-11-07: qty 1

## 2020-11-07 MED ORDER — INSULIN ASPART 100 UNIT/ML IJ SOLN
0.0000 [IU] | Freq: Three times a day (TID) | INTRAMUSCULAR | Status: DC
  Administered 2020-11-07: 2 [IU] via SUBCUTANEOUS

## 2020-11-07 MED ORDER — POTASSIUM CHLORIDE CRYS ER 20 MEQ PO TBCR
20.0000 meq | EXTENDED_RELEASE_TABLET | Freq: Every day | ORAL | Status: DC
  Administered 2020-11-07: 20 meq via ORAL
  Filled 2020-11-07 (×2): qty 1

## 2020-11-07 NOTE — Progress Notes (Signed)
Pt arrived to 4e from 2h. Pt oriented to room and staff. Vitals obtained. Telemetry box applied and CCMD notified. CHG bath completed. Pt denies needs at this time.

## 2020-11-07 NOTE — Progress Notes (Signed)
[id number] Offered to walk but pt resting now. Stated he walked earlier. Encouraged him to walk with Mobility specialist later. Pt did not have IS as he stated it fell on floor earlier. Gave pt IS and he demonstrated 1250 ml correctly twice. He stated he will use it as he uses one at home. Knows it will help get off oxygen. .95% on 3L. Graylon Good RN BSN 11/07/2020 2:40 PM

## 2020-11-07 NOTE — Progress Notes (Signed)
Epicardial wires pulled per Dr. Abran Duke order.

## 2020-11-07 NOTE — Progress Notes (Signed)
      [address]       [place] [address]             [phone]                 2 Days Post-Op Procedure(s) (LRB): CORONARY ARTERY BYPASS GRAFTING (CABG) TIMES 3, USING LEFT INTERNAL MAMMARY ARTERY AND GREATER SAPHEOUS VEIN HARVESTED ENDOSCOPICALLY LEFT AND RIGHT LEGS (N/A) APPLICATION OF CELL SAVER   Events: No events  _______________________________________________________________ Vitals: BP (!) 133/56 (BP Location: Right Arm)   Pulse 66   Temp 99.2 F (37.3 C) (Axillary)   Resp 17   Ht '5\' 11"'$  (1.803 m)   Wt 103.4 kg   SpO2 97%   BMI 31.79 kg/m   - Neuro: alert NAd  - Cardiovascular: sinus   Drips:   CVP:  [10 mmHg-20 mmHg] 16 mmHg  - Pulm: EWOB   ABG    Component Value Date/Time   PHART 7.362 11/06/2020 1056   PCO2ART 35.2 11/06/2020 1056   PO2ART 60 (L) 11/06/2020 1056   HCO3 19.9 (L) 11/06/2020 1056   TCO2 21 (L) 11/06/2020 1056   ACIDBASEDEF 5.0 (H) 11/06/2020 1056   O2SAT 89.0 11/06/2020 1056    - Abd: ND - Extremity: warm  .Intake/Output      08/08 0701 08/09 0700 08/09 0701 08/10 0700   [name]. 240 240   I.V. (mL/kg) 191 (1.8) 3 (0)   Blood     IV Piggyback 288.9 72.3   Total Intake(mL/kg) 719.9 (7) 315.3 (3)   Urine (mL/kg/hr) 1200 (0.5)    Blood     Chest Tube 250    Total Output 1450    Net -730.1 +315.3           _______________________________________________________________ Labs: CBC Latest Ref Rng & Units 11/07/2020 11/06/2020 11/06/2020  WBC 4.0 - 10.5 K/uL 11.2(H) 10.7(H) -  Hemoglobin 13.0 - 17.0 g/dL 9.1(L) 9.6(L) 8.8(L)  Hematocrit 39.0 - 52.0 % 27.3(L) 27.8(L) 26.0(L)  Platelets 150 - 400 K/uL 94(L) 98(L) -   CMP Latest Ref Rng & Units 11/07/2020 11/06/2020 11/06/2020  Glucose 70 - 99 mg/dL 128(H) 133(H) -  BUN 8 - 23 mg/dL 13 15 -  Creatinine 0.61 - 1.24 mg/dL 0.93 1.09 -  Sodium 135 - 145 mmol/L 135 135 141  Potassium 3.5 - 5.1 mmol/L 3.7 4.0 4.1  Chloride 98 - 111 mmol/L 102 106 -  CO2 22 - 32 mmol/L  24 21(L) -  Calcium 8.9 - 10.3 mg/dL 8.5(L) 8.4(L) -  Total Protein 6.5 - 8.1 g/dL - - -  Total Bilirubin 0.3 - 1.2 mg/dL - - -  Alkaline Phos 38 - 126 U/L - - -  AST 15 - 41 U/L - - -  ALT 0 - 44 U/L - - -    CXR: Small L pneumoT PV congestion  _______________________________________________________________  Assessment and Plan: POD 2 s/p CABG  Neuro: pain controlled CV:   will remove A line.  Good hemodynamics.  On A/S/BB.   Pulm:  Continue pulm hygiene.   Renal: good uop.  Creat stable.  Gentle diuresis today GI: on diet Heme: stable ID: [email or web address] Endo: SSI Dispo: floor   Hayden Deleon Hayden Deleon 11/07/2020 8:48 AM

## 2020-11-08 ENCOUNTER — Encounter (HOSPITAL_COMMUNITY)
Admission: AD | Disposition: A | Discharge status: Home / Self Care | Payer: Self-pay | Source: Other Acute Inpatient Hospital | Attending: Thoracic Surgery (Cardiothoracic Vascular Surgery)

## 2020-11-08 ENCOUNTER — Inpatient Hospital Stay: Admit: 2020-11-08 | Payer: BC Managed Care – PPO | Admitting: Thoracic Surgery (Cardiothoracic Vascular Surgery)

## 2020-11-08 ENCOUNTER — Inpatient Hospital Stay (HOSPITAL_COMMUNITY): Payer: BC Managed Care – PPO

## 2020-11-08 DIAGNOSIS — I200 Unstable angina: Secondary | ICD-10-CM | POA: Diagnosis not present

## 2020-11-08 LAB — BASIC METABOLIC PANEL
Anion gap: 5 (ref 5–15)
BUN: 13 mg/dL (ref 8–23)
CO2: 27 mmol/L (ref 22–32)
Calcium: 8.6 mg/dL — ABNORMAL LOW (ref 8.9–10.3)
Chloride: 106 mmol/L (ref 98–111)
Creatinine, Ser: 0.97 mg/dL (ref 0.61–1.24)
GFR, Estimated: >60 mL/min (ref >60)
Glucose, Bld: 99 mg/dL (ref 70–99)
Potassium: 3.8 mmol/L (ref 3.5–5.1)
Sodium: 138 mmol/L (ref 135–145)

## 2020-11-08 LAB — CBC
HCT: 27.5 % — ABNORMAL LOW (ref 39.0–52.0)
Hemoglobin: 9.4 g/dL — ABNORMAL LOW (ref 13.0–17.0)
MCH: 33.9 pg (ref 26.0–34.0)
MCHC: 34.2 g/dL (ref 30.0–36.0)
MCV: 99.3 fL (ref 80.0–100.0)
Platelets: 105 10*3/uL — ABNORMAL LOW (ref 150–400)
RBC: 2.77 MIL/uL — ABNORMAL LOW (ref 4.22–5.81)
RDW: 13.7 % (ref 11.5–15.5)
WBC: 10.5 10*3/uL (ref 4.0–10.5)
nRBC: 0 % (ref 0.0–0.2)

## 2020-11-08 LAB — GLUCOSE, CAPILLARY: Glucose-Capillary: 90 mg/dL (ref 70–99)

## 2020-11-08 SURGERY — CORONARY ARTERY BYPASS GRAFTING (CABG)
Anesthesia: General | Site: Chest

## 2020-11-08 MED ORDER — POTASSIUM CHLORIDE CRYS ER 20 MEQ PO TBCR
40.0000 meq | EXTENDED_RELEASE_TABLET | Freq: Every day | ORAL | Status: DC
  Administered 2020-11-08: 40 meq via ORAL
  Filled 2020-11-08: qty 2

## 2020-11-08 MED ORDER — GUAIFENESIN ER 600 MG PO TB12
600.0000 mg | ORAL_TABLET | Freq: Two times a day (BID) | ORAL | Status: DC
  Administered 2020-11-08 – 2020-11-10 (×5): 600 mg via ORAL
  Filled 2020-11-08 (×5): qty 1

## 2020-11-08 MED FILL — Albumin, Human Inj 5%: INTRAVENOUS | Qty: 250 | Status: AC

## 2020-11-08 MED FILL — Lidocaine HCl Local Preservative Free (PF) Inj 2%: INTRAMUSCULAR | Qty: 15 | Status: AC

## 2020-11-08 MED FILL — Calcium Chloride Inj 10%: INTRAVENOUS | Qty: 10 | Status: AC

## 2020-11-08 MED FILL — Heparin Sodium (Porcine) Inj 1000 Unit/ML: INTRAMUSCULAR | Qty: 20 | Status: AC

## 2020-11-08 MED FILL — Potassium Chloride Inj 2 mEq/ML: INTRAVENOUS | Qty: 40 | Status: AC

## 2020-11-08 MED FILL — Sodium Chloride IV Soln 0.9%: INTRAVENOUS | Qty: 3000 | Status: AC

## 2020-11-08 MED FILL — Sodium Bicarbonate IV Soln 8.4%: INTRAVENOUS | Qty: 50 | Status: AC

## 2020-11-08 MED FILL — Electrolyte-R (PH 7.4) Solution: INTRAVENOUS | Qty: 5000 | Status: AC

## 2020-11-08 MED FILL — Heparin Sodium (Porcine) Inj 1000 Unit/ML: INTRAMUSCULAR | Qty: 30 | Status: AC

## 2020-11-08 NOTE — Progress Notes (Addendum)
[address]       [place] [address]             [phone]      3 Days Post-Op Procedure(s) (LRB): CORONARY ARTERY BYPASS GRAFTING (CABG) TIMES 3, USING LEFT INTERNAL MAMMARY ARTERY AND GREATER SAPHEOUS VEIN HARVESTED ENDOSCOPICALLY LEFT AND RIGHT LEGS (N/A) APPLICATION OF CELL SAVER Subjective: Feels well, no specific C/O  Objective: Vital signs in last 24 hours: Temp:  [98.2 F (36.8 C)-99 F (37.2 C)] 98.9 F (37.2 C) (08/10 0340) Pulse Rate:  [63-74] 63 (08/10 0643) Cardiac Rhythm: Normal sinus rhythm (08/09 1906) Resp:  [15-22] 15 (08/10 0643) BP: (107-142)/(55-87) 107/55 (08/10 0340) SpO2:  [88 %-99 %] 93 % (08/10 0643) FiO2 (%):  [32 %] 32 % (08/09 1039) Weight:  [103 kg] 103 kg (08/10 0643)  Hemodynamic parameters for last 24 hours:    Intake/Output from previous day: 08/09 0701 - 08/10 0700 In: 325.3 [P.O.:240; I.V.:13; IV Piggyback:72.3] Out: 400 [Urine:400] Intake/Output this shift: No intake/output data recorded.  General appearance: alert, cooperative, and no distress Heart: regular rate and rhythm Lungs: clear to auscultation bilaterally Abdomen: benign Extremities: no edema Wound: incis healing well, mod echymosis right thigh   Lab Results: Recent Labs    11/07/20 0309 11/08/20 0624  WBC 11.2* 10.5  HGB 9.1* 9.4*  HCT 27.3* 27.5*  PLT 94* 105*   BMET:  Recent Labs    11/07/20 0309 11/08/20 0624  NA 135 138  K 3.7 3.8  CL 102 106  CO2 24 27  GLUCOSE 128* 99  BUN 13 13  CREATININE 0.93 0.97  CALCIUM 8.5* 8.6*    PT/INR:  Recent Labs    11/05/20 1340  LABPROT 17.6*  INR 1.4*   ABG    Component Value Date/Time   PHART 7.362 11/06/2020 1056   HCO3 19.9 (L) 11/06/2020 1056   TCO2 21 (L) 11/06/2020 1056   ACIDBASEDEF 5.0 (H) 11/06/2020 1056   O2SAT 89.0 11/06/2020 1056   CBG (last 3)  Recent Labs    11/07/20 1722 11/07/20 2133 11/08/20 0627  GLUCAP 113* 131* 90    Meds Scheduled Meds:   acetaminophen  1,000 mg Oral Q6H   Or   acetaminophen (TYLENOL) oral liquid 160 mg/5 mL  1,000 mg Per Tube Q6H   aspirin EC  325 mg Oral Daily   Or   aspirin  324 mg Per Tube Daily   atorvastatin  80 mg Oral Daily   bisacodyl  10 mg Oral Daily   Or   bisacodyl  10 mg Rectal Daily   Chlorhexidine Gluconate Cloth  6 each Topical Daily   docusate sodium  200 mg Oral Daily   enoxaparin (LOVENOX) injection  40 mg Subcutaneous QHS   fluticasone furoate-vilanterol  1 puff Inhalation Daily   furosemide  40 mg Oral Daily   insulin aspart  0-20 Units Subcutaneous TID WC   metoprolol tartrate  12.5 mg Oral BID   Or   metoprolol tartrate  12.5 mg Per Tube BID   pantoprazole  40 mg Oral Daily   potassium chloride  20 mEq Oral Daily   sertraline  50 mg Oral Daily   sodium chloride flush  3 mL Intravenous Q12H   umeclidinium bromide  1 puff Inhalation Daily   Continuous Infusions:  sodium chloride Stopped (11/06/20 0615)   sodium chloride     sodium chloride     sodium chloride  lactated ringers     lactated ringers     lactated ringers Stopped (11/06/20 0932)   PRN Meds:.sodium chloride, sodium chloride, lactated ringers, metoprolol tartrate, ondansetron (ZOFRAN) IV, oxyCODONE, sodium chloride flush, traMADol  Xrays DG Chest Port 1 View  Result Date: 11/07/2020 CLINICAL DATA:  Pleural effusion, central chest pain at incision site, history coronary artery disease post CABG, COPD, hypertension, diastolic dysfunction EXAM: PORTABLE CHEST 1 VIEW COMPARISON:  Portable exam 0524 hours compared to 11/06/2020 FINDINGS: Interval removal of nasogastric and endotracheal tubes. Mediastinal drain and thoracostomy tube remain. RIGHT jugular central venous catheter with tip projecting over SVC. Upper normal heart size post CABG. Slight pulmonary vascular congestion. Mild perihilar pulmonary edema and minimal LEFT pleural effusion. Tiny residual RIGHT pneumothorax. No acute osseous findings. IMPRESSION:  Postoperative changes with mild pulmonary edema and tiny LEFT pleural effusion. Minimal residual RIGHT pneumothorax. Electronically Signed   By: Lavonia Dana M.D.   On: 11/07/2020 08:19    Assessment/Plan: S/P Procedure(s) (LRB): CORONARY ARTERY BYPASS GRAFTING (CABG) TIMES 3, USING LEFT INTERNAL MAMMARY ARTERY AND GREATER SAPHEOUS VEIN HARVESTED ENDOSCOPICALLY LEFT AND RIGHT LEGS (N/A) APPLICATION OF CELL SAVER  1 Tmax 99, VSS sBP 100's-140 range. Sinus rhythm, occas PVC 2 sats good on 2 liters [place] 3 weight trending down, about 4 KG >preop, UOP not accurate - conts daily lasix 4 normal renal fxn 5 no leukocytosis 6 thrombocytopenia trend improving 7 expected ABLA is stable 8 CBG's well controlled, no DM hx/meds 9 cont pulm toilet/rehab 10 poss home 1-2 days dep on progress    LOS: 5 days    John Giovanni PA-C Pager [phone] 11/08/2020    Agree with above. Doing well. Wean O2. Continue physical therapy.  Lashawnna Lambrecht Bary Leriche

## 2020-11-08 NOTE — Progress Notes (Addendum)
Progress Note  Patient Name: Hayden Deleon Date of Encounter: 11/08/2020  [facility] Cardiologist: [name], MD   Subjective   Patient states he is doing well, denied any resting chest pain, endorses chets pain when cough. He states his chest tube and wires were taken out yesterday. He had walked with PT briefly and is able to tolerate that. He mentioned that he quit smoking in 2001.     Inpatient Medications    Scheduled Meds:  acetaminophen  1,000 mg Oral Q6H   Or   acetaminophen (TYLENOL) oral liquid 160 mg/5 mL  1,000 mg Per Tube Q6H   aspirin EC  325 mg Oral Daily   Or   aspirin  324 mg Per Tube Daily   atorvastatin  80 mg Oral Daily   bisacodyl  10 mg Oral Daily   Or   bisacodyl  10 mg Rectal Daily   Chlorhexidine Gluconate Cloth  6 each Topical Daily   docusate sodium  200 mg Oral Daily   enoxaparin (LOVENOX) injection  40 mg Subcutaneous QHS   fluticasone furoate-vilanterol  1 puff Inhalation Daily   furosemide  40 mg Oral Daily   insulin aspart  0-20 Units Subcutaneous TID WC   metoprolol tartrate  12.5 mg Oral BID   Or   metoprolol tartrate  12.5 mg Per Tube BID   pantoprazole  40 mg Oral Daily   potassium chloride  20 mEq Oral Daily   sertraline  50 mg Oral Daily   sodium chloride flush  3 mL Intravenous Q12H   umeclidinium bromide  1 puff Inhalation Daily   Continuous Infusions:  sodium chloride Stopped (11/06/20 0615)   sodium chloride     sodium chloride     sodium chloride     lactated ringers     lactated ringers     lactated ringers Stopped (11/06/20 0932)   PRN Meds: sodium chloride, sodium chloride, lactated ringers, metoprolol tartrate, ondansetron (ZOFRAN) IV, oxyCODONE, sodium chloride flush, traMADol   Vital Signs    Vitals:   11/07/20 1951 11/08/20 0021 11/08/20 0340 11/08/20 0643  BP: 126/62 (!) 120/58 (!) 107/55   Pulse: 72 66 63 63  Resp: '15 18 15 15  '$ Temp: 98.2 F (36.8 C) 98.6 F (37 C) 98.9 F (37.2 C)    TempSrc: Oral Oral Oral   SpO2: 93% 96% 99% 93%  Weight:    103 kg  Height:        Intake/Output Summary (Last 24 hours) at 11/08/2020 0739 Last data filed at 11/07/2020 1504 Gross per 24 hour  Intake 325.33 ml  Output 400 ml  Net -74.67 ml   Last 3 Weights 11/08/2020 11/07/2020 11/06/2020  Weight (lbs) 227 lb 227 lb 15.3 oz 235 lb 7.2 oz  Weight (kg) 102.967 kg 103.4 kg 106.8 kg      Telemetry    Sinus rhythm with rate of 70s, occasional PVCs.  - Personally Reviewed  ECG    N/A this AM  - Personally Reviewed  Physical Exam   GEN: No acute distress.   Neck: No JVD Cardiac: RRR, no murmurs, rubs, or gallops. Mid-sternum post-op dressing in place.  Respiratory: Clear but diminished at base to auscultation bilaterally. No wheezing. On 1LNC with pox 96% GI: Soft, nontender, non-distended  MS: Trace BLE edema; No deformity. Right thigh surgical incision intact with mild ecchymosis surround  Neuro:  Nonfocal  Psych: Normal affect  Left radial site with dressing in place, hand  warm, no neurovascular deficit noted   Labs    High Sensitivity Troponin:  No results for input(s): TROPONINIHS in the last 720 hours.    Chemistry Recent Labs  Lab 11/04/20 0320 11/05/20 0524 11/06/20 1608 11/07/20 0309 11/08/20 0624  NA 139   < > 135 135 138  K 3.8   < > 4.0 3.7 3.8  CL 107   < > 106 102 106  CO2 26   < > 21* 24 27  GLUCOSE 95   < > 133* 128* 99  BUN 8   < > '15 13 13  '$ CREATININE 0.98   < > 1.09 0.93 0.97  CALCIUM 9.1   < > 8.4* 8.5* 8.6*  PROT 6.1*  --   --   --   --   ALBUMIN 3.3*  --   --   --   --   AST 17  --   --   --   --   ALT 19  --   --   --   --   ALKPHOS 50  --   --   --   --   BILITOT 0.9  --   --   --   --   GFRNONAA >60   < > >60 >60 >60  ANIONGAP 6   < > '8 9 5   '$ < > = values in this interval not displayed.     Hematology Recent Labs  Lab 11/06/20 1608 11/07/20 0309 11/08/20 0624  WBC 10.7* 11.2* 10.5  RBC 2.86* 2.74* 2.77*  HGB 9.6* 9.1* 9.4*   HCT 27.8* 27.3* 27.5*  MCV 97.2 99.6 99.3  MCH 33.6 33.2 33.9  MCHC 34.5 33.3 34.2  RDW 13.4 13.6 13.7  PLT 98* 94* 105*    BNPNo results for input(s): BNP, PROBNP in the last 168 hours.   DDimer No results for input(s): DDIMER in the last 168 hours.   Radiology    DG Chest Port 1 View  Result Date: 11/07/2020 CLINICAL DATA:  Pleural effusion, central chest pain at incision site, history coronary artery disease post CABG, COPD, hypertension, diastolic dysfunction EXAM: PORTABLE CHEST 1 VIEW COMPARISON:  Portable exam 0524 hours compared to 11/06/2020 FINDINGS: Interval removal of nasogastric and endotracheal tubes. Mediastinal drain and thoracostomy tube remain. RIGHT jugular central venous catheter with tip projecting over SVC. Upper normal heart size post CABG. Slight pulmonary vascular congestion. Mild perihilar pulmonary edema and minimal LEFT pleural effusion. Tiny residual RIGHT pneumothorax. No acute osseous findings. IMPRESSION: Postoperative changes with mild pulmonary edema and tiny LEFT pleural effusion. Minimal residual RIGHT pneumothorax. Electronically Signed   By: Lavonia Dana M.D.   On: 11/07/2020 08:19    Cardiac Studies   Left/Right Heart Catheterizations from 11/03/20:    Prox RCA to Mid RCA lesion is 99% stenosed.   1st Mrg lesion is 100% stenosed.   Prox LAD to Mid LAD lesion is 30% stenosed.   Ost LM lesion is 85% stenosed.   The left ventricular systolic function is normal.   LV end diastolic pressure is mildly elevated.   The left ventricular ejection fraction is 55-65% by visual estimate.   1.  Severe ostial left main stenosis and subtotal occlusion of the right coronary artery with left-to-right collaterals.  Chronically occluded OM1.  Significant pressure dampening with catheter engagement of the left main.  The ostial left main stenosis is eccentric and seems to be due to a calcified plaque from the aorta with  a slitlike origin. 2.  Normal LV systolic function  and mildly elevated left ventricular end-diastolic pressure.   Recommendations: Recommend transfer to [facility] for evaluation of CABG.  The patient has been having chest pain with any level of exertion.  His symptoms are unstable and with current anatomy, I do not think it would be safe to do this as an outpatient. Recommend starting heparin drip at 6 PM.   Echo from 11/04/2020:   1. Left ventricular ejection fraction, by estimation, is 70 to 75%. The  left ventricle has hyperdynamic function. The left ventricle has no  regional wall motion abnormalities. There is moderate left ventricular  hypertrophy. Left ventricular diastolic parameters were normal.   2. Right ventricular systolic function is normal. The right ventricular  size is normal.   3. Right atrial size was mildly dilated.   4. Trivial mitral valve regurgitation.   5. The aortic valve is normal in structure. Aortic valve regurgitation is  not visualized.   6. There is mild dilatation of the ascending aorta, measuring 40 mm.     Patient Profile     Hayden Deleon is a 65 y.o. male with PMH of CAD, HTN, HLD, COPD, carotid artery disease, prior tobacco use quitting in 2005, and OSA, who underwent outpatient cardiac cath on 11/03/20 due to exertional angina. Cath showed subtotally occluded right coronary artery as well as significant ostial left main stenosis.  Ejection fraction was normal with mildly elevated left ventricular end-diastolic pressure. He was transferred to [facility] on 11/04/20 for CABG evaluation, underwent CABG x3 including LIMA to LAD, reverse SVG to PDA, reverse SVG to OM 2 on 11/05/20. Cardiology is following peripherally post-operatively.      Assessment & Plan   Stable angina  CAD - transferred from [place] to [facility] for CABG evaluation on 11/04/20, after outpatient cath (done for progressive angina) on 11/03/20 showed subtotally occluded right coronary artery as well as significant ostial left main stenosis - Echo  from 11/04/20 with EF 70 to 75% - s/p CABG x3 including LIMA to LAD, reverse SVG to PDA, reverse SVG to OM 2 on 11/05/20 by Dr Kipp Brood  - post-op course: extubated and off vasopressor support now, no A fib on telemetry noted, adequate pain control, tolerating PO intake and PT, diuresis per CTS, wean off oxygen as tolerated  - medical therapy: continue ASA '325mg'$  per CTS, Liptior '80mg'$ , Metoprolol 12.'5mg'$  BID  - cardiology follow up arranged on 11/16/20   HTN - BP controlled at this time, off pressors - home med amlodipine '5mg'$  and metoprolol '50mg'$  BID held  - currently continued on low dose metoprolol 12.'5mg'$  BID, may up-tritiate if BP rising   HLD - LDL 34 on 11/07/20,at goal  - continue lipitor '80mg'$  daily, increased from '40mg'$  this admission   Carotid arterial disease -  Ultrasound from 03/2020 showed stable 1 to 39% right internal carotid artery stenosis and 40 to 59% left internal carotid artery stenosis.   - Continue aspirin and statin.   - follow up ultrasound planned in December 2022.  OSA: CPAP HS  COPD : no acute exacerbation Anemia : monitor Hgb GERD : on PPI  Anxiety : on SSRI  - managed per primary team     For questions or updates, please contact [place] Please consult [email or web address] for contact info under        Signed, Margie Billet, NP  11/08/2020, 7:39 AM    Personally seen and examined. Agree with APP above  with the following comments: Briefly 65 yo M with multivessel disease Patient notes he is doing much better post cath.  Still has sternal pain. - therapy as above; we will not plan on returning his BB to 50 mg PO BID unless worsening ectopy; he has pause pre-RCA intervention.  Rudean Haskell, MD [address], [address] [place], [place] [address] [phone]  11:18 AM

## 2020-11-08 NOTE — Progress Notes (Signed)
Mobility Specialist: Progress Note   11/08/20 1249  Mobility  Activity Ambulated in hall  Level of Assistance Standby assist, set-up cues, supervision of patient - no hands on  Assistive Device Front wheel walker  Distance Ambulated (ft) 280 ft  Mobility Ambulated with assistance in hallway  Mobility Response Tolerated well  Mobility performed by Mobility specialist  Bed Position Chair  $Mobility charge 1 Mobility   Pre-Mobility: 64 HR, 90% SpO2 During Mobility: 80 HR, 93% SpO2 Post-Mobility: 79 HR, 146/65 BP, 93% SpO2  Pt contact guard to stand from recliner and mod independent during ambulation. Pt asx throughout. Pt back to recliner after walk with call bell and phone in reach. Pt has family member present in the room.   [facility] Gavyn Zoss Mobility Specialist Mobility Specialist Phone: [phone]

## 2020-11-08 NOTE — Progress Notes (Signed)
CARDIAC REHAB PHASE I   PRE:  Rate/Rhythm: 68 SR  BP:  Supine: 130/59  Sitting:   Standing:    SaO2: 94%2L  MODE:  Ambulation: 250 ft   POST:  Rate/Rhythm: 83 SR  BP:  Supine:   Sitting: 150/90  Standing:    SaO2: 90% 2L hall, 88% in room so bumped to 3L to recover, then back to 11/2-2L  [id number] Reinforced sternal precautions. Pt walked 250 ft on 2L. Encouraged pursed lip breathing and stopped twice and I checked sats. Maintained on 2L until returning to room. To recliner with call bell and took 3L to recover. Once pt recovered from walk, decreased oxygen to 1/12-2L. Encouraged IS. Wife in room. Encouraged more walks with staff.  Graylon Good, RN BSN  11/08/2020 10:34 AM

## 2020-11-09 MED ORDER — POTASSIUM CHLORIDE CRYS ER 20 MEQ PO TBCR
40.0000 meq | EXTENDED_RELEASE_TABLET | Freq: Two times a day (BID) | ORAL | Status: AC
  Administered 2020-11-09 – 2020-11-10 (×3): 40 meq via ORAL
  Filled 2020-11-09 (×3): qty 2

## 2020-11-09 MED ORDER — METOLAZONE 5 MG PO TABS
2.5000 mg | ORAL_TABLET | Freq: Once | ORAL | Status: AC
  Administered 2020-11-09: 2.5 mg via ORAL
  Filled 2020-11-09: qty 1

## 2020-11-09 MED ORDER — FUROSEMIDE 10 MG/ML IJ SOLN
40.0000 mg | Freq: Four times a day (QID) | INTRAMUSCULAR | Status: AC
  Administered 2020-11-09: 40 mg via INTRAVENOUS
  Filled 2020-11-09: qty 4

## 2020-11-09 NOTE — Progress Notes (Signed)
CARDIAC REHAB PHASE I   PRE:  Rate/Rhythm: 70 SR   Up in room   SaO2: 90% 1L  MODE:  Ambulation: 590 ft   POST:  Rate/Rhythm: 85 SR  BP:  Supine: 177/71  Sitting:   Standing:    SaO2: 86-87% 2L   to 3L 90-91%    back to 1L after recovery [id number] Pt up in room walking back from bathroom. Offered walk. Pt increased distance to 590 ft. Took a couple of standing rest breaks so I could check sat more accurately. Had to increase from 2 to 3L to keep sats up. Back to bed after walk and sats stable. Put back to 1L for rest and 91%. Gave flutter valve and encouraged pt to use in addition to IS. Instructed in use.    Graylon Good, RN BSN  11/09/2020 10:29 AM

## 2020-11-09 NOTE — Discharge Summary (Signed)
Physician Discharge Summary  Patient ID: Hayden Deleon MRN: [id number] DOB/AGE: 65-23-1957 65 y.o.  Admit date: 11/03/2020 Discharge date: 11/10/2020  Admission Diagnoses:  Unstable angina pectoris COPD Remote h/o tobacco use Obstructive sleep apnea Hypertension Dyslipidemia Obesity  Discharge Diagnoses:   Unstable angina pectoris COPD Remote h/o tobacco use Obstructive sleep apnea Hypertension Dyslipidemia Obesity Carotid artery disease (HCC) S/P CABG (coronary artery bypass grafting) x 3 Hypoxia Pulmonary edema Expected acute blood loss anemia  Discharged Condition: stable  History of Present Illness:     65 year old male transferred from [facility] after undergoing elective left heart cath which showed severe left main/three-vessel coronary artery disease.  The patient states that he has been having exertional angina that is progressed over the last year.  He underwent a stress test in January of this year which was abnormal.  He currently is chest pain-free. He denies any lower extremity swelling, orthopnea, or shortness of breath at rest. We discussed the risks and benefits of surgical revascularization and he is agreeable to proceed.  He is scheduled for 11/05/2020.  Carotids from 2021 were reviewed.  He had no significant disease at that time.  Hospital Course: Mr. Reuter remained stable following left heart catheterization.  He was taken to the operating room on 11/05/2020 where CABG x3 was carried out without complication.  Saphenous vein was harvested endoscopically from both thighs.  The left internal mammary artery was grafted to the left anterior descending coronary artery.  Saphenous veins were grafted to the obtuse marginal and right coronary arteries.  Following the procedure, he transferred to the surgical ICU in stable condition on Levophed.  He remained hemodynamically stable.  Ventilator support continued until the morning of the first  postoperative day he was extubated uneventfully.  Vasoactive medications were weaned.  Monitoring lines were removed and he was mobilized protocol. Chest tubes were removed on post op day 2. Patient was started on Lopressor 08/09. He was volume overloaded and diuresed accordingly. He had expected post op blood loss anemia. He did not require a post op transfusion. Last H and H was **. He also had thrombocytopenia. His last platelet count was up to **. He was weaned off the Insulin drip. His HGA1C pre op was 5.7. He will be provided nutrition information with discharge instructions. Patient was felt surgically stable for transfer to 4E on 08/09. He continued to have significant hypoxia related to his COPD compounded with expected post-operative volume excess. He was diuresed aggressively and pulmonary hygiene was encouraged.   Consults: None  Significant Diagnostic Studies:   LEFT HEART CATH AND CORONARY ANGIOGRAPHY   Conclusion      Prox RCA to Mid RCA lesion is 99% stenosed.   1st Mrg lesion is 100% stenosed.   Prox LAD to Mid LAD lesion is 30% stenosed.   Ost LM lesion is 85% stenosed.   The left ventricular systolic function is normal.   LV end diastolic pressure is mildly elevated.   The left ventricular ejection fraction is 55-65% by visual estimate.   1.  Severe ostial left main stenosis and subtotal occlusion of the right coronary artery with left-to-right collaterals.  Chronically occluded OM1.  Significant pressure dampening with catheter engagement of the left main.  The ostial left main stenosis is eccentric and seems to be due to a calcified plaque from the aorta with a slitlike origin. 2.  Normal LV systolic function and mildly elevated left ventricular end-diastolic pressure.   Recommendations: Recommend transfer  to [facility] for evaluation of CABG.  The patient has been having chest pain with any level of exertion.  His symptoms are unstable and with current anatomy, I do not  think it would be safe to do this as an outpatient. Recommend starting heparin drip at 6 PM.  Diagnostic Dominance: Right     Treatments:      [address]       [place] [address]             [phone]                                           11/05/2020 Patient:  Hayden Deleon Pre-Op Dx: Left main/three-vessel coronary artery disease                         Hypertension                         Obesity                         Hyperlipidemia Post-op Dx: Same Procedure: CABG X 3.  LIMA to LAD, reverse saphenous vein graft to PDA, reverse saphenous vein graft to OM 2. Endoscopic greater saphenous vein harvest on the right and left legs     Surgeon and Role:      * Lightfoot, Lucile Crater, MD - Primary    *M. Shianne Zeiser, PA-C- assisting   Anesthesia  general EBL:  565m Blood Administration: none Xclamp Time:  57 min Pump Time:  1053m   Drains: 1971 blake drain: R, L, mediastinal  Wires: ventricular Counts: correct     Indications: 89ear old male with left main/three-vessel coronary disease who is admitted following an elective left heart cath.  We discussed the risks and benefits of surgical revascularization and he is agreeable to proceed.  He is scheduled for 11/05/2020.  Carotids from 2021 were reviewed.  He had no significant disease at that time.   Findings: Good LIMA, good vein graft.  Small calcified LAD.  Small PDA.  Heavily calcified obtuse marginal.  Small caliber.  Discharge Exam: Blood pressure (!) 111/59, pulse 72, temperature 98.5 F (36.9 C), temperature source Oral, resp. rate 20, height '5\' 11"'$  (1.803 m), weight 103 kg, SpO2 90 %.  General appearance: alert, cooperative, and mild distress Neurologic: intact Heart: Regular rate and rhythm, occasional PAC and PVC. Lungs: Coarse, shallow breath sounds bilaterally Abdomen: Soft, nontender Extremities: Trace peripheral edema, incisions intact and dry Wound:  The sternal  incision is well approximated and dry.  Chest tube site incisions are intact and dry as well.    Disposition:    Allergies as of 11/10/2020       Reactions   Avelox [moxifloxacin Hcl In Nacl] Other (See Comments)   Facial swelling   Sulfamethoxazole-trimethoprim Other (See Comments)   REACTION: sore mouth   Imdur [isosorbide Nitrate] Other (See Comments)   Made pt feel groggy, loopy, bad dreams    Aspirin Other (See Comments)   REACTION: Nausea; fine with '81mg'$         Medication List     STOP taking these medications    amLODipine 5 MG tablet Commonly known as: NORVASC   metoprolol tartrate 50 MG tablet Commonly known as: [employer]  TAKE these medications    albuterol 108 (90 Base) MCG/ACT inhaler Commonly known as: VENTOLIN HFA TAKE 2 PUFFS BY MOUTH EVERY 6 HOURS AS NEEDED FOR WHEEZE OR SHORTNESS OF BREATH What changed: See the new instructions.   aspirin 325 MG EC tablet Take 1 tablet (325 mg total) by mouth daily.   atorvastatin 80 MG tablet Commonly known as: LIPITOR Take 1 tablet (80 mg total) by mouth daily. What changed:  medication strength how much to take how to take this when to take this additional instructions   B-12 3000 MCG Subl Place 3,000 mcg under the tongue daily.   furosemide 40 MG tablet Commonly known as: Lasix Take 1 tablet (40 mg total) by mouth daily for 7 doses.   guaiFENesin 600 MG 12 hr tablet Commonly known as: MUCINEX Take 1 tablet (600 mg total) by mouth 2 (two) times daily.   multivitamin capsule Take 1 capsule by mouth daily.   nitroGLYCERIN 0.4 MG SL tablet Commonly known as: NITROSTAT Place 0.4 mg under the tongue every 5 (five) minutes as needed for chest pain.   potassium chloride SA 20 MEQ tablet Commonly known as: KLOR-CON Take 1 tablet (20 mEq total) by mouth daily.   sertraline 50 MG tablet Commonly known as: ZOLOFT TAKE 1 TABLET BY MOUTH EVERY DAY   traMADol 50 MG tablet Commonly known as:  ULTRAM Take 1 tablet (50 mg total) by mouth every 6 (six) hours as needed for up to 5 days for moderate pain.   Trelegy Ellipta 100-62.5-25 MCG/INH Aepb Generic drug: Fluticasone-Umeclidin-Vilant TAKE 1 PUFF BY MOUTH EVERY DAY What changed: See the new instructions.        Follow-up Information     Theora Gianotti, NP Follow up on 11/16/2020.   Specialties: Nurse Practitioner, Cardiology, Radiology Why: Please arrive 15 minutes early for your 10:30am post-hospital cardiology appointment Contact information: [address] [place] [address] [phone]         Lajuana Matte, MD. Call on 11/17/2020.   Specialty: Cardiothoracic Surgery Why: This is a VIRTUAL FOLLOW UP APPOINTMENT AT 2PM with Dr. Kipp Brood.  Do not go to the office. Contact information: [address] [place] [address] [phone]                 Signed: Antony Odea, PA-C 11/10/2020, 8:15 AM

## 2020-11-09 NOTE — Discharge Instructions (Addendum)
Discharge Instructions:  1. You may shower, please wash incisions daily with soap and water and keep dry.  If you wish to cover wounds with dressing you may do so but please keep clean and change daily.  No tub baths or swimming until incisions have completely healed.  If your incisions become red or develop any drainage please call our office at [phone]  2. No Driving until cleared by Dr. Abran Duke office and you are no longer using narcotic pain medications  3. Monitor your weight daily.. Please use the same scale and weigh at same time... If you gain 5-10 lbs in 48 hours with associated lower extremity swelling, please contact our office at [phone]  4. Fever of 101.5 for at least 24 hours with no source, please contact our office at [phone]  5. Activity- up as tolerated, please walk at least 3 times per day.  Avoid strenuous activity, no lifting, pushing, or pulling with your arms over 8-10 lbs for a minimum of 6 weeks  6. Continue using the incentive spirometer and flutter valve 6 times daily for 1 week.  7. If any questions or concerns arise, please do not hesitate to contact our office at [phone]

## 2020-11-09 NOTE — Progress Notes (Addendum)
[address]       [place] [address]             [phone]      4 Days Post-Op Procedure(s) (LRB): CORONARY ARTERY BYPASS GRAFTING (CABG) TIMES 3, USING LEFT INTERNAL MAMMARY ARTERY AND GREATER SAPHEOUS VEIN HARVESTED ENDOSCOPICALLY LEFT AND RIGHT LEGS (N/A) APPLICATION OF CELL SAVER Subjective: Awake and alert, says he is still having frequent cough that is mildly productive and chest soreness associated with cough.  O2 at 3  1/2 L/min by nasal cannula this morning.  He is tolerating cardiac diet and having bowel movements.  Objective: Vital signs in last 24 hours: Temp:  [98.1 F (36.7 C)-99.3 F (37.4 C)] 98.1 F (36.7 C) (08/11 0449) Pulse Rate:  [63-71] 63 (08/11 0449) Cardiac Rhythm: Normal sinus rhythm (08/10 2015) Resp:  [13-20] 17 (08/11 0449) BP: (105-146)/(53-88) 105/65 (08/11 0449) SpO2:  [91 %-96 %] 95 % (08/11 0449) FiO2 (%):  [28 %] 28 % (08/10 0808)     Intake/Output from previous day: 08/10 0701 - 08/11 0700 In: -  Out: 950 [Urine:950] Intake/Output this shift: No intake/output data recorded.  General appearance: alert, cooperative, and mild distress Neurologic: intact Heart: Regular rate and rhythm, occasional PAC. Lungs: Coarse, shallow breath sounds bilaterally Abdomen: Soft, nontender Extremities: Trace peripheral edema, incisions intact and dry Wound: The Aquacel dressing was removed.  The sternal incision is well approximated and dry.  This tube site incisions are intact and dry as well.  The wires been removed.  Lab Results: Recent Labs    11/07/20 0309 11/08/20 0624  WBC 11.2* 10.5  HGB 9.1* 9.4*  HCT 27.3* 27.5*  PLT 94* 105*   BMET:  Recent Labs    11/07/20 0309 11/08/20 0624  NA 135 138  K 3.7 3.8  CL 102 106  CO2 24 27  GLUCOSE 128* 99  BUN 13 13  CREATININE 0.93 0.97  CALCIUM 8.5* 8.6*    PT/INR: No results for input(s): LABPROT, INR in the last 72 hours. ABG    Component Value Date/Time    PHART 7.362 11/06/2020 1056   HCO3 19.9 (L) 11/06/2020 1056   TCO2 21 (L) 11/06/2020 1056   ACIDBASEDEF 5.0 (H) 11/06/2020 1056   O2SAT 89.0 11/06/2020 1056   CBG (last 3)  Recent Labs    11/07/20 1722 11/07/20 2133 11/08/20 0627  GLUCAP 113* 131* 90    Assessment/Plan: S/P Procedure(s) (LRB): CORONARY ARTERY BYPASS GRAFTING (CABG) TIMES 3, USING LEFT INTERNAL MAMMARY ARTERY AND GREATER SAPHEOUS VEIN HARVESTED ENDOSCOPICALLY LEFT AND RIGHT LEGS (N/A) APPLICATION OF CELL SAVER  -Postop day 4 CABG x3 for multivessel and left main coronary stenoses presenting with unstable angina.  LV function is preserved.  Stable cardiac status.  On aspirin, atorvastatin, and low-dose metoprolol.` Blood pressure 105-130s.    -Hypoxia with expected postoperative volume excess--has a history of COPD with remote history of tobacco use.  No new weight recorded today but he was still about 4 kg above preop yesterday.  Chest x-ray showing diffuse bilateral interstitial opacities.  Need to continue with diuresis (IV Lasix x 2 doses today) and work on pulmonary hygiene.  -H/O HTN- BP control adequate. Will accept mild HTN while working on diuresis.   -Expected acute blood loss anemia- Hct stable.   - Disposition- planning for eventual discharge to home but needs more lung work and diuresis.    LOS: 6 days    Antony Odea,  PA-C [id number] 11/09/2020   Agree with above Continue diuresis  Dispo planning  Aliyana Dlugosz O Devonn Giampietro

## 2020-11-09 NOTE — Progress Notes (Addendum)
Progress Note  Patient Name: Nimit Melnikov Date of Encounter: 11/09/2020  [facility] Cardiologist: [name], MD   Subjective   Patient states he is feeling good, was able to walk 600 feet today without chest pain and SOB, felt slightly dizzy today. He states his Pox is around 86-87% at home.He is tolerating PO intake well.     Inpatient Medications    Scheduled Meds:  acetaminophen  1,000 mg Oral Q6H   Or   acetaminophen (TYLENOL) oral liquid 160 mg/5 mL  1,000 mg Per Tube Q6H   aspirin EC  325 mg Oral Daily   Or   aspirin  324 mg Per Tube Daily   atorvastatin  80 mg Oral Daily   bisacodyl  10 mg Oral Daily   Or   bisacodyl  10 mg Rectal Daily   Chlorhexidine Gluconate Cloth  6 each Topical Daily   docusate sodium  200 mg Oral Daily   enoxaparin (LOVENOX) injection  40 mg Subcutaneous QHS   fluticasone furoate-vilanterol  1 puff Inhalation Daily   furosemide  40 mg Intravenous Q6H   guaiFENesin  600 mg Oral BID   metoprolol tartrate  12.5 mg Oral BID   Or   metoprolol tartrate  12.5 mg Per Tube BID   pantoprazole  40 mg Oral Daily   potassium chloride  40 mEq Oral BID   sertraline  50 mg Oral Daily   sodium chloride flush  3 mL Intravenous Q12H   umeclidinium bromide  1 puff Inhalation Daily   Continuous Infusions:  sodium chloride Stopped (11/06/20 0615)   sodium chloride     sodium chloride     sodium chloride     lactated ringers     lactated ringers     lactated ringers Stopped (11/06/20 0932)   PRN Meds: sodium chloride, sodium chloride, lactated ringers, metoprolol tartrate, ondansetron (ZOFRAN) IV, oxyCODONE, sodium chloride flush, traMADol   Vital Signs    Vitals:   11/08/20 2353 11/09/20 0449 11/09/20 0740 11/09/20 0833  BP: 125/60 105/65  102/77  Pulse: 70 63  68  Resp: '13 17  18  '$ Temp: 99.3 F (37.4 C) 98.1 F (36.7 C)  98 F (36.7 C)  TempSrc: Oral Oral  Oral  SpO2: 93% 95% 92% 90%  Weight:      Height:         Intake/Output Summary (Last 24 hours) at 11/09/2020 1007 Last data filed at 11/09/2020 0835 Gross per 24 hour  Intake 600 ml  Output 375 ml  Net 225 ml    Last 3 Weights 11/08/2020 11/07/2020 11/06/2020  Weight (lbs) 227 lb 227 lb 15.3 oz 235 lb 7.2 oz  Weight (kg) 102.967 kg 103.4 kg 106.8 kg      Telemetry    Sinus rhythm with rate of 60-70s, occasional PVCs.  - Personally Reviewed  ECG    N/A this AM  - Personally Reviewed  Physical Exam   GEN: No acute distress.   Neck: No JVD Cardiac: RRR, no murmurs, rubs, or gallops. Mid-sternum post-op dressing in place.  Respiratory: Clear to auscultation bilaterally. No wheezing. On 1LNC with pox 89-92% GI: Soft, nontender, non-distended  MS: Trace BLE edema; No deformity. Right thigh surgical incision intact with mild ecchymosis surround  Neuro:  Nonfocal  Psych: Normal affect  Bilateral radial site with dressing in place, hand warm, no neurovascular deficit noted   Labs    High Sensitivity Troponin:  No results for input(s):  TROPONINIHS in the last 720 hours.    Chemistry Recent Labs  Lab 11/04/20 0320 11/05/20 0524 11/06/20 1608 11/07/20 0309 11/08/20 0624  NA 139   < > 135 135 138  K 3.8   < > 4.0 3.7 3.8  CL 107   < > 106 102 106  CO2 26   < > 21* 24 27  GLUCOSE 95   < > 133* 128* 99  BUN 8   < > '15 13 13  '$ CREATININE 0.98   < > 1.09 0.93 0.97  CALCIUM 9.1   < > 8.4* 8.5* 8.6*  PROT 6.1*  --   --   --   --   ALBUMIN 3.3*  --   --   --   --   AST 17  --   --   --   --   ALT 19  --   --   --   --   ALKPHOS 50  --   --   --   --   BILITOT 0.9  --   --   --   --   GFRNONAA >60   < > >60 >60 >60  ANIONGAP 6   < > '8 9 5   '$ < > = values in this interval not displayed.      Hematology Recent Labs  Lab 11/06/20 1608 11/07/20 0309 11/08/20 0624  WBC 10.7* 11.2* 10.5  RBC 2.86* 2.74* 2.77*  HGB 9.6* 9.1* 9.4*  HCT 27.8* 27.3* 27.5*  MCV 97.2 99.6 99.3  MCH 33.6 33.2 33.9  MCHC 34.5 33.3 34.2  RDW 13.4  13.6 13.7  PLT 98* 94* 105*     BNPNo results for input(s): BNP, PROBNP in the last 168 hours.   DDimer No results for input(s): DDIMER in the last 168 hours.   Radiology    DG Chest 2 View  Result Date: 11/08/2020 CLINICAL DATA:  Pneumothorax, history of CABG EXAM: CHEST - 2 VIEW COMPARISON:  11/07/2020 FINDINGS: Interval removal of mediastinal and left chest drainage tubes as well as a right neck vascular catheter. No significant pneumothorax. Small, layering bilateral pleural effusions and diffuse bilateral interstitial opacity, similar to prior. Cardiomegaly status post median sternotomy and CABG. IMPRESSION: 1. Interval removal of mediastinal and left chest drainage tubes as well as a right neck vascular catheter. 2.  No significant pneumothorax. 3.  Cardiomegaly. Electronically Signed   By: Eddie Candle M.D.   On: 11/08/2020 08:28    Cardiac Studies   Left/Right Heart Catheterizations from 11/03/20:    Prox RCA to Mid RCA lesion is 99% stenosed.   1st Mrg lesion is 100% stenosed.   Prox LAD to Mid LAD lesion is 30% stenosed.   Ost LM lesion is 85% stenosed.   The left ventricular systolic function is normal.   LV end diastolic pressure is mildly elevated.   The left ventricular ejection fraction is 55-65% by visual estimate.   1.  Severe ostial left main stenosis and subtotal occlusion of the right coronary artery with left-to-right collaterals.  Chronically occluded OM1.  Significant pressure dampening with catheter engagement of the left main.  The ostial left main stenosis is eccentric and seems to be due to a calcified plaque from the aorta with a slitlike origin. 2.  Normal LV systolic function and mildly elevated left ventricular end-diastolic pressure.   Recommendations: Recommend transfer to [facility] for evaluation of CABG.  The patient has been having chest pain with  any level of exertion.  His symptoms are unstable and with current anatomy, I do not think it would be  safe to do this as an outpatient. Recommend starting heparin drip at 6 PM.   Echo from 11/04/2020:   1. Left ventricular ejection fraction, by estimation, is 70 to 75%. The  left ventricle has hyperdynamic function. The left ventricle has no  regional wall motion abnormalities. There is moderate left ventricular  hypertrophy. Left ventricular diastolic parameters were normal.   2. Right ventricular systolic function is normal. The right ventricular  size is normal.   3. Right atrial size was mildly dilated.   4. Trivial mitral valve regurgitation.   5. The aortic valve is normal in structure. Aortic valve regurgitation is  not visualized.   6. There is mild dilatation of the ascending aorta, measuring 40 mm.     Patient Profile     Zacary Deloe is a 65 y.o. male with PMH of CAD, HTN, HLD, COPD, carotid artery disease, prior tobacco use quitting in 2005, and OSA, who underwent outpatient cardiac cath on 11/03/20 due to exertional angina. Cath showed subtotally occluded right coronary artery as well as significant ostial left main stenosis.  Ejection fraction was normal with mildly elevated left ventricular end-diastolic pressure. He was transferred to [facility] on 11/04/20 for CABG evaluation, underwent CABG x3 including LIMA to LAD, reverse SVG to PDA, reverse SVG to OM 2 on 11/05/20. Cardiology is following peripherally post-operatively.      Assessment & Plan   Stable angina  CAD - transferred from [place] to [facility] for CABG evaluation on 11/04/20, after outpatient cath (done for progressive angina) on 11/03/20 showed subtotally occluded right coronary artery as well as significant ostial left main stenosis - Echo from 11/04/20 with EF 70 to 75% - s/p CABG x3 including LIMA to LAD, reverse SVG to PDA, reverse SVG to OM 2 on 11/05/20 by Dr Kipp Brood  - post-op course: extubated and off vasopressor support now, no A fib on telemetry noted, adequate pain control, tolerating PO intake and PT, diuresis per  CTS, wean off oxygen as tolerated (note his baseline pox is around 86-87% RA at home due to COPD, avoid excess oxygen use)  - medical therapy: continue ASA '325mg'$  per CTS, Liptior '80mg'$ , Metoprolol 12.'5mg'$  BID  - cardiology follow up arranged with cardiology on 11/16/20   HTN - BP controlled at this time, off pressors - home med amlodipine '5mg'$  and metoprolol '50mg'$  BID held  - currently continued on low dose metoprolol 12.'5mg'$  BID, may up-tritiate if BP rising, BP 102/77 -134/53 over the past 24 hours   HLD - LDL 34 on 11/07/20,at goal  - continue lipitor '80mg'$  daily, increased from '40mg'$  this admission   Carotid arterial disease -  Ultrasound from 03/2020 showed stable 1 to 39% right internal carotid artery stenosis and 40 to 59% left internal carotid artery stenosis.   - Continue aspirin and statin.   - follow up ultrasound planned in December 2022.  OSA: CPAP HS  COPD : no acute exacerbation, pox 86-87% RA at home, he is likely near his baseline  Anemia : monitor Hgb, has been stable around 9  GERD : on PPI  Anxiety : on SSRI  - managed per primary team     For questions or updates, please contact [facility] Please consult [email or web address] for contact info under        Signed, Margie Billet, NP  11/09/2020, 10:07 AM  Personally seen and examined. Agree with APP above with the following comments: Briefly 64 yo M with multi-vessel disease who presented need CABG performed 11/05/20 Patient notes a 2 pack history of 32 years; paitent notes baseline O2 sat of 88-92% Exam notable for decease breath sounds in bases, sternal scar well approximated, no l radial bruit or hematoma. Labs notable for stalbe anemia Hgb 9. Personally reviewed relevant tests; rare PVCs without pauses on tele Would recommend  - therapy as above; diuresis is reasonable; did IS teaching - updated patient's primary cardiologist   Rudean Haskell, [place]  [address] [place], [place] [address] [phone]  10:53 AM

## 2020-11-09 NOTE — Progress Notes (Signed)
Per PA Parks Neptune will hold diuretics for now.  Pt c/o feeling light-headed and confused after getting. Pt has voided between 2000-2500 ml. Pt resting with call bell within reach.  Will continue to monitor.

## 2020-11-10 ENCOUNTER — Inpatient Hospital Stay (HOSPITAL_COMMUNITY): Payer: BC Managed Care – PPO

## 2020-11-10 LAB — CBC
HCT: 29.6 % — ABNORMAL LOW (ref 39.0–52.0)
Hemoglobin: 9.7 g/dL — ABNORMAL LOW (ref 13.0–17.0)
MCH: 32.6 pg (ref 26.0–34.0)
MCHC: 32.8 g/dL (ref 30.0–36.0)
MCV: 99.3 fL (ref 80.0–100.0)
Platelets: 207 10*3/uL (ref 150–400)
RBC: 2.98 MIL/uL — ABNORMAL LOW (ref 4.22–5.81)
RDW: 13.7 % (ref 11.5–15.5)
WBC: 10 10*3/uL (ref 4.0–10.5)
nRBC: 0.2 % (ref 0.0–0.2)

## 2020-11-10 LAB — BASIC METABOLIC PANEL
Anion gap: 8 (ref 5–15)
BUN: 15 mg/dL (ref 8–23)
CO2: 25 mmol/L (ref 22–32)
Calcium: 8.9 mg/dL (ref 8.9–10.3)
Chloride: 104 mmol/L (ref 98–111)
Creatinine, Ser: 0.97 mg/dL (ref 0.61–1.24)
GFR, Estimated: >60 mL/min (ref >60)
Glucose, Bld: 126 mg/dL — ABNORMAL HIGH (ref 70–99)
Potassium: 3.7 mmol/L (ref 3.5–5.1)
Sodium: 137 mmol/L (ref 135–145)

## 2020-11-10 MED ORDER — FUROSEMIDE 40 MG PO TABS: 40.0000 mg | ORAL_TABLET | Freq: Every day | ORAL | 0 refills | Status: DC

## 2020-11-10 MED ORDER — METOPROLOL SUCCINATE ER 25 MG PO TB24
25.0000 mg | ORAL_TABLET | Freq: Every day | ORAL | Status: DC
  Administered 2020-11-10: 25 mg via ORAL
  Filled 2020-11-10: qty 1

## 2020-11-10 MED ORDER — ATORVASTATIN CALCIUM 80 MG PO TABS: 80.0000 mg | ORAL_TABLET | Freq: Every day | ORAL | 2 refills | Status: DC

## 2020-11-10 MED ORDER — TRAMADOL HCL 50 MG PO TABS: 50.0000 mg | ORAL_TABLET | Freq: Four times a day (QID) | ORAL | 0 refills | Status: AC | PRN

## 2020-11-10 MED ORDER — GUAIFENESIN ER 600 MG PO TB12: 600.0000 mg | ORAL_TABLET | Freq: Two times a day (BID) | ORAL | 99 refills | Status: DC

## 2020-11-10 MED ORDER — POTASSIUM CHLORIDE CRYS ER 20 MEQ PO TBCR: 20.0000 meq | EXTENDED_RELEASE_TABLET | Freq: Every day | ORAL | 0 refills | Status: DC

## 2020-11-10 MED ORDER — ASPIRIN 325 MG PO TBEC: 325.0000 mg | DELAYED_RELEASE_TABLET | Freq: Every day | ORAL | 0 refills | Status: AC

## 2020-11-10 NOTE — Progress Notes (Signed)
      [address]       [place] [address]             [phone]      5 Days Post-Op Procedure(s) (LRB): CORONARY ARTERY BYPASS GRAFTING (CABG) TIMES 3, USING LEFT INTERNAL MAMMARY ARTERY AND GREATER SAPHEOUS VEIN HARVESTED ENDOSCOPICALLY LEFT AND RIGHT LEGS (N/A) APPLICATION OF CELL SAVER Subjective: Awake and alert, no new concerns. He is anxious to go home. On O2 at 1Lnc overnight but currently on RA with O2 sat 91%.  Says he does not feel short of breath.   Good response to diuresis yesterday.   Objective: Vital signs in last 24 hours: Temp:  [98 F (36.7 C)-98.5 F (36.9 C)] 98.5 F (36.9 C) (08/11 1751) Pulse Rate:  [68-72] 72 (08/11 1751) Cardiac Rhythm: Normal sinus rhythm (08/11 1909) Resp:  [18-20] 20 (08/11 1751) BP: (102-152)/(59-77) 111/59 (08/11 1751) SpO2:  [90 %-93 %] 93 % (08/11 1751)     Intake/Output from previous day: 08/11 0701 - 08/12 0700 In: 600 [P.O.:600] Out: 1875 [Urine:1875] Intake/Output this shift: No intake/output data recorded.  General appearance: alert, cooperative, and mild distress Neurologic: intact Heart: Regular rate and rhythm, occasional PAC and PVC. Lungs: Coarse, shallow breath sounds bilaterally Abdomen: Soft, nontender Extremities: Trace peripheral edema, incisions intact and dry Wound:  The sternal incision is well approximated and dry.  Chest tube site incisions are intact and dry as well.   Lab Results: Recent Labs    11/08/20 0624 11/10/20 0105  WBC 10.5 10.0  HGB 9.4* 9.7*  HCT 27.5* 29.6*  PLT 105* 207    BMET:  Recent Labs    11/08/20 0624 11/10/20 0105  NA 138 137  K 3.8 3.7  CL 106 104  CO2 27 25  GLUCOSE 99 126*  BUN 13 15  CREATININE 0.97 0.97  CALCIUM 8.6* 8.9     PT/INR: No results for input(s): LABPROT, INR in the last 72 hours. ABG    Component Value Date/Time   PHART 7.362 11/06/2020 1056   HCO3 19.9 (L) 11/06/2020 1056   TCO2 21 (L) 11/06/2020 1056    ACIDBASEDEF 5.0 (H) 11/06/2020 1056   O2SAT 89.0 11/06/2020 1056   CBG (last 3)  Recent Labs    11/07/20 1722 11/07/20 2133 11/08/20 0627  GLUCAP 113* 131* 90     Assessment/Plan: S/P Procedure(s) (LRB): CORONARY ARTERY BYPASS GRAFTING (CABG) TIMES 3, USING LEFT INTERNAL MAMMARY ARTERY AND GREATER SAPHEOUS VEIN HARVESTED ENDOSCOPICALLY LEFT AND RIGHT LEGS (N/A) APPLICATION OF CELL SAVER  -Postop day 5 CABG x3 for multivessel and left main coronary stenoses presenting with unstable angina.  LV function is preserved.  Stable cardiac status.  On aspirin, atorvastatin, and low-dose metoprolol.  -Hypoxia with expected postoperative volume excess--has a history of COPD with remote history of tobacco use.  Diuresed well yesterday though total I&O not recorded. Currently on RA with O2 sats likely at his baseline. CXR showinw a small left effusion with associated ATX.   -H/O HTN- BP control adequate. Will likely need BP meds titrated as outpatient.   -Expected acute blood loss anemia- Hct stable.   - Disposition- plan for discharge today. Continue oral Lasix for 5 days. Advised to continue was frequent ambulation and IS / flutter valve at home.    LOS: 7 days    Antony Odea, [place] [phone] 11/10/2020

## 2020-11-10 NOTE — Telephone Encounter (Signed)
Pt currently admitted. He currently has follow up scheduled with Ignacia Bayley, NP 11/16/20.  Will follow up regarding discharge.

## 2020-11-10 NOTE — Progress Notes (Signed)
Pt discharged home with wife.  All instructions given and reviewed, all questions answered.  Assisted to load home O2 into car.

## 2020-11-10 NOTE — TOC Transition Note (Signed)
Transition of Care (TOC) - CM/SW Discharge Note Marvetta Gibbons RN, BSN Transitions of Care Unit 4E- RN Case Manager See Treatment Team for direct phone #    Patient Details  Name: Mavric Slavey MRN: [id number] Date of Birth: September 12, 1955  Transition of Care [facility]) CM/SW Contact:  Dawayne Patricia, RN Phone Number: 11/10/2020, 10:06 AM   Clinical Narrative:    Pt stable for transition home today, from home w/ spouse. order placed for home 02, call placed to in house provider- [employer] for home 02 needs- portable 02 will be delivered to room prior to discharge for pt to transport home with.    Final next level of care: Home/Self Care Barriers to Discharge: No Barriers Identified   Patient Goals and CMS Choice Patient states their goals for this hospitalization and ongoing recovery are:: return home      Discharge Placement               home        Discharge Plan and Services   Discharge Planning Services: CM Consult Post Acute Care Choice: Durable Medical Equipment          DME Arranged: Oxygen DME Agency: [employer] Date DME Agency Contacted: 11/10/20 Time DME Agency Contacted: 1005 Representative spoke with at DME Agency: [place]: NA [place] Agency: NA        Social Determinants of Health ([place]) Interventions     Readmission Risk Interventions Readmission Risk Prevention Plan 11/10/2020  Post Dischage Appt Complete  Medication Screening Complete  Transportation Screening Complete  Some recent data might be hidden

## 2020-11-10 NOTE — Progress Notes (Signed)
[id number] Education completed with pt and wife who voiced understanding. Stressed importance of adhering to sternal precautions and staying in the tube. Gave heart healthy diet and discussed healthy food choices. Encouraged IS and flutter valve. Gave walking instructions for ex. Discussed CRP 2 and will refer to [facility]. . Discussed wound care.  Graylon Good RN BSN 11/10/2020 1:32 PM

## 2020-11-10 NOTE — Progress Notes (Signed)
CARDIAC REHAB PHASE I   PRE:  Rate/Rhythm: 68 SR  BP:  Supine: 131/69  Sitting:   Standing:    SaO2: 90%RA  MODE:  Ambulation: 590 ft   POST:  Rate/Rhythm: 82 SR  BP:  Supine:   Sitting: 155/78  Standing:    SaO2: 86%RA  2L 89-90% 0800-0835 Came to walk pt to see if there is need for home oxygen as he has not walked on RA and it took 3L to keep sats up yesterday. Tried pt on RA with monitoring sats two ways. With portable monitor and my sat machine. Had pt stop three times to assess correctly. Took 2L to keep sats up. Pt stated may have felt a little better with oxygen but he could not tell a big difference. Offered rolling walker since pt has been walking with one but he declined at this time. To recliner on 2L and decreased to 1L after recovery. Will return to ed when wife here. Discussed findings with RN. See qualifying note.   Graylon Good, RN BSN  11/10/2020 8:29 AM

## 2020-11-10 NOTE — Progress Notes (Signed)
SATURATION QUALIFICATIONS: (This note is used to comply with regulatory documentation for home oxygen)  Patient Saturations on Room Air at Rest = 90%  Patient Saturations on Room Air while Ambulating = 86%  Patient Saturations on 2 Liters of oxygen while Ambulating = 90%  Please briefly explain why patient needs home oxygen: desat on RA when walking

## 2020-11-10 NOTE — Progress Notes (Addendum)
Progress Note  Patient Name: Hayden Deleon Date of Encounter: 11/10/2020  [facility] Cardiologist: [name], MD   Subjective   Patient states he is doing well, waiting for discharge, was working with PT and ambulating well. He states his BP gets elevated after PT session but soon settles down. He denied any chest pain, SOB, oliguria.    Inpatient Medications    Scheduled Meds:  acetaminophen  1,000 mg Oral Q6H   Or   acetaminophen (TYLENOL) oral liquid 160 mg/5 mL  1,000 mg Per Tube Q6H   aspirin EC  325 mg Oral Daily   Or   aspirin  324 mg Per Tube Daily   atorvastatin  80 mg Oral Daily   bisacodyl  10 mg Oral Daily   Or   bisacodyl  10 mg Rectal Daily   Chlorhexidine Gluconate Cloth  6 each Topical Daily   docusate sodium  200 mg Oral Daily   enoxaparin (LOVENOX) injection  40 mg Subcutaneous QHS   fluticasone furoate-vilanterol  1 puff Inhalation Daily   guaiFENesin  600 mg Oral BID   metoprolol succinate  25 mg Oral Daily   pantoprazole  40 mg Oral Daily   potassium chloride  40 mEq Oral BID   sertraline  50 mg Oral Daily   sodium chloride flush  3 mL Intravenous Q12H   umeclidinium bromide  1 puff Inhalation Daily   Continuous Infusions:  sodium chloride Stopped (11/06/20 0615)   sodium chloride     sodium chloride     sodium chloride     lactated ringers     lactated ringers     lactated ringers Stopped (11/06/20 0932)   PRN Meds: sodium chloride, sodium chloride, lactated ringers, metoprolol tartrate, ondansetron (ZOFRAN) IV, oxyCODONE, sodium chloride flush, traMADol   Vital Signs    Vitals:   11/09/20 0833 11/09/20 1354 11/09/20 1751 11/10/20 0807  BP: 102/77 (!) 152/71 (!) 111/59   Pulse: 68 69 72   Resp: '18 20 20   '$ Temp: 98 F (36.7 C) 98.3 F (36.8 C) 98.5 F (36.9 C)   TempSrc: Oral Oral Oral   SpO2: 90% 93% 93% 90%  Weight:      Height:        Intake/Output Summary (Last 24 hours) at 11/10/2020 0839 Last data filed at  11/09/2020 1753 Gross per 24 hour  Intake --  Output 1450 ml  Net -1450 ml   Last 3 Weights 11/08/2020 11/07/2020 11/06/2020  Weight (lbs) 227 lb 227 lb 15.3 oz 235 lb 7.2 oz  Weight (kg) 102.967 kg 103.4 kg 106.8 kg      Telemetry    Sinus rhythm with rate of 60-70s, rare PVCs.  - Personally Reviewed  ECG    N/A this AM  - Personally Reviewed  Physical Exam   GEN: No acute distress. Ambulate independently.  Neck: No JVD Cardiac: RRR, no murmurs, rubs, or gallops. Mid-sternum post-op dressing in place.  Respiratory: Clear to auscultation bilaterally. No wheezing. On 1LNC with pox 92% GI: Soft, nontender, non-distended  MS: Trace BLE edema; No deformity. Right thigh surgical incision intact with surround ecchymosis Neuro:  Nonfocal  Psych: Normal affect  Bilateral radial site with dressing in place, hand warm, no neurovascular deficit noted   Labs    High Sensitivity Troponin:  No results for input(s): TROPONINIHS in the last 720 hours.    Chemistry Recent Labs  Lab 11/04/20 0320 11/05/20 [id number] 11/07/20 0309 11/08/20 [id number] 11/10/20  0105  NA 139   < > 135 138 137  K 3.8   < > 3.7 3.8 3.7  CL 107   < > 102 106 104  CO2 26   < > '24 27 25  '$ GLUCOSE 95   < > 128* 99 126*  BUN 8   < > '13 13 15  '$ CREATININE 0.98   < > 0.93 0.97 0.97  CALCIUM 9.1   < > 8.5* 8.6* 8.9  PROT 6.1*  --   --   --   --   ALBUMIN 3.3*  --   --   --   --   AST 17  --   --   --   --   ALT 19  --   --   --   --   ALKPHOS 50  --   --   --   --   BILITOT 0.9  --   --   --   --   GFRNONAA >60   < > >60 >60 >60  ANIONGAP 6   < > '9 5 8   '$ < > = values in this interval not displayed.     Hematology Recent Labs  Lab 11/07/20 0309 11/08/20 0624 11/10/20 0105  WBC 11.2* 10.5 10.0  RBC 2.74* 2.77* 2.98*  HGB 9.1* 9.4* 9.7*  HCT 27.3* 27.5* 29.6*  MCV 99.6 99.3 99.3  MCH 33.2 33.9 32.6  MCHC 33.3 34.2 32.8  RDW 13.6 13.7 13.7  PLT 94* 105* 207    BNPNo results for input(s): BNP, PROBNP in the  last 168 hours.   DDimer No results for input(s): DDIMER in the last 168 hours.   Radiology    No results found.  Cardiac Studies   Left/Right Heart Catheterizations from 11/03/20:    Prox RCA to Mid RCA lesion is 99% stenosed.   1st Mrg lesion is 100% stenosed.   Prox LAD to Mid LAD lesion is 30% stenosed.   Ost LM lesion is 85% stenosed.   The left ventricular systolic function is normal.   LV end diastolic pressure is mildly elevated.   The left ventricular ejection fraction is 55-65% by visual estimate.   1.  Severe ostial left main stenosis and subtotal occlusion of the right coronary artery with left-to-right collaterals.  Chronically occluded OM1.  Significant pressure dampening with catheter engagement of the left main.  The ostial left main stenosis is eccentric and seems to be due to a calcified plaque from the aorta with a slitlike origin. 2.  Normal LV systolic function and mildly elevated left ventricular end-diastolic pressure.   Recommendations: Recommend transfer to [facility] for evaluation of CABG.  The patient has been having chest pain with any level of exertion.  His symptoms are unstable and with current anatomy, I do not think it would be safe to do this as an outpatient. Recommend starting heparin drip at 6 PM.   Echo from 11/04/2020:   1. Left ventricular ejection fraction, by estimation, is 70 to 75%. The  left ventricle has hyperdynamic function. The left ventricle has no  regional wall motion abnormalities. There is moderate left ventricular  hypertrophy. Left ventricular diastolic parameters were normal.   2. Right ventricular systolic function is normal. The right ventricular  size is normal.   3. Right atrial size was mildly dilated.   4. Trivial mitral valve regurgitation.   5. The aortic valve is normal in structure. Aortic valve regurgitation is  not visualized.   6. There is mild dilatation of the ascending aorta, measuring 40 mm.     Patient  Profile     Hayden Deleon is a 65 y.o. male with PMH of CAD, HTN, HLD, COPD, carotid artery disease, prior tobacco use quitting in 2005, and OSA, who underwent outpatient cardiac cath on 11/03/20 due to exertional angina. Cath showed subtotally occluded right coronary artery as well as significant ostial left main stenosis.  EF was normal with mildly elevated LVEDP. He was transferred to [facility] on 11/04/20 for CABG evaluation, underwent CABG x3 including LIMA to LAD, reverse SVG to PDA, reverse SVG to OM 2 on 11/05/20. Cardiology is following peripherally post-operatively.      Assessment & Plan   Stable angina  CAD - transferred from [place] to [facility] for CABG evaluation on 11/04/20, after outpatient cath (done for progressive angina) on 11/03/20 showed subtotally occluded right coronary artery as well as significant ostial left main stenosis - Echo from 11/04/20 with EF 70 to 75% - s/p CABG x3 including LIMA to LAD, reverse SVG to PDA, reverse SVG to OM 2 on 11/05/20 by Dr Kipp Brood  - post-op course: extubated and off vasopressor support , no A fib on telemetry noted, adequate pain control, tolerating PO intake and PT, diuresis x5 days per CTS, weaned off oxygen as tolerated (note his baseline pox is around 86-87% RA at home due to COPD, avoid excess oxygen use)  - medical therapy: continue ASA '325mg'$  per CTS, Liptior '80mg'$ , will transition Metoprolol 12.'5mg'$  BID to '25mg'$  XL daily today at discharge  - Post cath and radial site care discussed in detail   - cardiology follow up arranged with cardiology on 11/16/20  - follow up with CTS as instructed   HTN - BP controlled at this time, off pressors, seems elevated after PT session but resting BP low normal - home med amlodipine '5mg'$  and metoprolol '50mg'$  BID held during this admission  - currently tolerating low dose metoprolol 12.'5mg'$  BID, will transition Metoprolol to '25mg'$  XL daily today at discharge, follow up in the office for further titration if need   HLD -  LDL 34 on 11/07/20,at goal  - continue lipitor '80mg'$  daily, increased from '40mg'$  this admission, patient agreeable   Carotid arterial disease -  Ultrasound from 03/2020 showed stable 1 to 39% right internal carotid artery stenosis and 40 to 59% left internal carotid artery stenosis.   - Continue aspirin and statin.   - follow up ultrasound planned in December 2022.  OSA: CPAP HS  COPD : no acute exacerbation, pox 86-87% RA at home, he is likely near his baseline  Anemia : monitor Hgb, has been stable around 9  GERD : on PPI  Anxiety : on SSRI  - managed per primary team     For questions or updates, please contact [facility] Please consult [email or web address] for contact info under        Signed, Margie Billet, NP  11/10/2020, 8:39 AM    Personally seen and examined. Agree with APP above with the following comments: - S/p CABG doing well - Planned for metoprolol succinate at DC - Will need outpatient eval for COPD and medication optimization - Has follow up 11/16/20  Rudean Haskell, MD [place]  [address] [place], [place] [address] [phone]  10:15 AM

## 2020-11-13 ENCOUNTER — Other Ambulatory Visit: Payer: Self-pay | Admitting: *Deleted

## 2020-11-13 DIAGNOSIS — Z951 Presence of aortocoronary bypass graft: Secondary | ICD-10-CM

## 2020-11-13 NOTE — Telephone Encounter (Signed)
Patient contacted regarding discharge from [facility] on 11/10/20.  Patient understands to follow up with provider Murray Hodgkins NP on 11/16/20 at 10:30 am at [facility] Patient understands discharge instructions? Yes Patient understands medications and regiment? Yes Patient understands to bring all medications to this visit? Yes  Instructed him to bring in medications so we can update his list. He verbalized understanding with no questions or concerns at this time.

## 2020-11-16 ENCOUNTER — Ambulatory Visit (INDEPENDENT_AMBULATORY_CARE_PROVIDER_SITE_OTHER): Payer: BC Managed Care – PPO | Admitting: Nurse Practitioner

## 2020-11-16 ENCOUNTER — Other Ambulatory Visit: Payer: Self-pay

## 2020-11-16 ENCOUNTER — Encounter: Payer: Self-pay | Admitting: Nurse Practitioner

## 2020-11-16 VITALS — BP 120/70 | HR 94 | Ht 71.0 in | Wt 208.4 lb

## 2020-11-16 DIAGNOSIS — I6523 Occlusion and stenosis of bilateral carotid arteries: Secondary | ICD-10-CM

## 2020-11-16 DIAGNOSIS — I10 Essential (primary) hypertension: Secondary | ICD-10-CM

## 2020-11-16 DIAGNOSIS — G4733 Obstructive sleep apnea (adult) (pediatric): Secondary | ICD-10-CM

## 2020-11-16 DIAGNOSIS — E785 Hyperlipidemia, unspecified: Secondary | ICD-10-CM | POA: Diagnosis not present

## 2020-11-16 DIAGNOSIS — I2510 Atherosclerotic heart disease of native coronary artery without angina pectoris: Secondary | ICD-10-CM | POA: Diagnosis not present

## 2020-11-16 DIAGNOSIS — I951 Orthostatic hypotension: Secondary | ICD-10-CM | POA: Diagnosis not present

## 2020-11-16 NOTE — Progress Notes (Signed)
Office Visit    Patient Name: Hayden Deleon Date of Encounter: 11/16/2020  Primary Care Provider:  Libby Maw, MD Primary Cardiologist:  [name], MD  Chief Complaint    65 year old male with a history of CAD status post recent CABG, hypertension, hyperlipidemia, COPD, sleep apnea and carotid arterial disease, who presents for follow-up after hospitalization for coronary artery bypass grafting.  Past Medical History    Past Medical History:  Diagnosis Date   Allergy    Anxiety    Arthritis    right shoulder   CAD (coronary artery disease)    a. 2003 Cath: OM1 100%, good collats, RCA 60-70p-->med rx; b. 04/2020 MV: mild ischemia to sm area of mid inflat & apical lat segments. EF >65%; d. 10/2020 Cath: LM 85ost, LAD 30p/m, OM1 100, RCA 99p/m; e. 10/2020 CABG x 3: LIMA->LAD, VG->RPDA, VG->OM2.   Carotid artery disease ([place])    a. 03/2020 Carotid U/S: RICA 123456, LICA 123456.   COPD (chronic obstructive pulmonary disease) ([place])    Significant Dr. Lamonte Sakai   Diastolic dysfunction    a. 03/2018 Echo: EF 60-65%, no rwma, Gr1 DD. Mildly dil LA. Nl RV fxn.   Dilation of ascending aorta and aortic root ([place])    a. 10/2020 Echo: Asc Ao 90m.   Diverticulosis of colon    GERD (gastroesophageal reflux disease)    Gout    Hyperlipidemia    Hypertension    LVH (left ventricular hypertrophy)    a. 10/2020 Echo: EF 70-75%, no rwma, mod LVH. Nl RV size/fxn. Mildly dil RA. Triv MR. Asc Ao 434m   Prostatitis    Sleep apnea    uses cpap nightly   Past Surgical History:  Procedure Laterality Date   COLONOSCOPY  12/2003   [place] - polyp   CORONARY ARTERY BYPASS GRAFT N/A 11/05/2020   Procedure: CORONARY ARTERY BYPASS GRAFTING (CABG) TIMES 3, USING LEFT INTERNAL MAMMARY ARTERY AND GREATER SAPHEOUS VEIN HARVESTED ENDOSCOPICALLY LEFT AND RIGHT LEGS;  Surgeon: LiLajuana MatteMD;  [place] [facility] Service: Open Heart Surgery;  Laterality: N/A;   deviated septum      KNEE ARTHROSCOPY     right   LEFT HEART CATH AND CORONARY ANGIOGRAPHY N/A 11/03/2020   Procedure: LEFT HEART CATH AND CORONARY ANGIOGRAPHY;  Surgeon: ArWellington HampshireMD;  [facility];  Service: Cardiovascular;  Laterality: N/A;   TOTAL SHOULDER ARTHROPLASTY Right 02/03/2018   Procedure: RIGHT REVERSE SHOULDER ARTHROPLASTY;  Surgeon: DeMeredith PelMD;  [facility] Service: Orthopedics;  Laterality: Right;   WISDOM TOOTH EXTRACTION      Allergies  Allergies  Allergen Reactions   Avelox [Moxifloxacin Hcl In Nacl] Other (See Comments)    Facial swelling   Sulfamethoxazole-Trimethoprim Other (See Comments)    REACTION: sore mouth   Imdur [Isosorbide Nitrate] Other (See Comments)    Made pt feel groggy, loopy, bad dreams    Aspirin Other (See Comments)    REACTION: Nausea; fine with '81mg'$     History of Present Illness    [age]ear old male with the above past medical history including CAD, hypertension, hyperlipidemia, COPD, sleep apnea, and carotid arterial disease.  Diagnostic catheterization 2003 showed an occluded OM1 with collaterals as well as a 60 to 70% proximal RCA stenosis.  EF was normal.  He had a prolonged history of stable angina with mild chest pain with overexertion.  Stress testing in January 2022 in the setting of progressive  angina showed a small area of mild ischemia in the inferolateral and apical lateral segments.  EF was greater than 65%.  CT imaging showed coronary calcifications and aortic atherosclerosis.  He was also noted to have bilateral groundglass opacities with more focal consolidation versus scarring in the left lower lobe with recommendation for dedicated CT if clinically appropriate.  Due to progressive symptoms reported in June of this year, diagnostic catheterization was advised however he initially deferred.  At follow-up in July, he agreed to schedule and this was subsequently performed in early August revealing 85% ostial left  main disease, an occluded OM1, and 99% proximal and mid RCA stenosis.  He was transferred to [facility] and seen by the CT surgical team.  Underwent CABG x3 with a LIMA to the LAD, vein graft to the PDA, and vein graft to the OM 2.  Postoperative course was relatively uncomplicated and he was discharged home August 12.  Since his CABG and hospitalization, he has noted significant improvement in his energy level and activity tolerance.  He has not been experiencing chest pain or dyspnea and now recognizes just how badly off he was prior to surgery.  He only notes mild pleuritic sternal chest discomfort with coughing and has only been using 1 Tylenol daily.  He feels as though his surgical wounds have been healing well.  He denies palpitations, PND, orthopnea, syncope, edema, or early satiety.  He has occasionally noted orthostatic lightheadedness.  He is interested in participating in cardiac rehab once cleared by surgery.  Home Medications    Current Outpatient Medications  Medication Sig Dispense Refill   albuterol (VENTOLIN HFA) 108 (90 Base) MCG/ACT inhaler TAKE 2 PUFFS BY MOUTH EVERY 6 HOURS AS NEEDED FOR WHEEZE OR SHORTNESS OF BREATH 6.7 each 10   aspirin EC 325 MG EC tablet Take 1 tablet (325 mg total) by mouth daily. 30 tablet 0   atorvastatin (LIPITOR) 80 MG tablet Take 1 tablet (80 mg total) by mouth daily. 30 tablet 2   Cyanocobalamin (B-12) 3000 MCG SUBL Place 3,000 mcg under the tongue daily.     guaiFENesin (MUCINEX) 600 MG 12 hr tablet Take 1 tablet (600 mg total) by mouth 2 (two) times daily. 30 tablet PRN   Multiple Vitamin (MULTIVITAMIN) capsule Take 1 capsule by mouth daily.     nitroGLYCERIN (NITROSTAT) 0.4 MG SL tablet Place 0.4 mg under the tongue every 5 (five) minutes as needed for chest pain.     sertraline (ZOLOFT) 50 MG tablet TAKE 1 TABLET BY MOUTH EVERY DAY 90 tablet 1   TRELEGY ELLIPTA 100-62.5-25 MCG/INH AEPB TAKE 1 PUFF BY MOUTH EVERY DAY 180 each 2   No current  facility-administered medications for this visit.     Review of Systems    Doing much better post CABG.  Minimal incisional pain.  Occasional orthostatic lightheadedness.  He denies angina, dyspnea, palpitations, PND, orthopnea, syncope, edema, or early satiety..  All other systems reviewed and are otherwise negative except as noted above.  Physical Exam    VS:  BP 120/70 (BP Location: Left Arm, Patient Position: Sitting, Cuff Size: Normal)   Pulse 94   Ht '5\' 11"'$  (1.803 m)   Wt 208 lb 6 oz (94.5 kg)   SpO2 96%   BMI 29.06 kg/m  , BMI Body mass index is 29.06 kg/m.     Orthostatic VS for the past 24 hrs:  BP- Lying Pulse- Lying BP- Sitting Pulse- Sitting BP- Standing at 0 minutes  Pulse- Standing at 0 minutes  11/16/20 1055 132/78 91 120/77 95 99/50 96   GEN: Well nourished, well developed, in no acute distress. HEENT: normal. Neck: Supple, no JVD, carotid bruits, or masses. Cardiac: RRR, no murmurs, rubs, or gallops. No clubbing, cyanosis, edema.  Radials/PT 2+ and equal bilaterally.  Midline surgical incision and medial right and left lower leg incisions are healing well without bleeding, drainage, or erythema. Respiratory:  Respirations regular and unlabored, clear to auscultation bilaterally. GI: Soft, nontender, nondistended, BS + x 4.  Surgical incisions healing well without bleeding, drainage, or erythema. MS: no deformity or atrophy. Skin: warm and dry, no rash. Neuro:  Strength and sensation are intact. Psych: Normal affect.  Accessory Clinical Findings    ECG personally reviewed by me today -regular sinus rhythm, 94- no acute changes.  Lab Results  Component Value Date   WBC 10.0 11/10/2020   HGB 9.7 (L) 11/10/2020   HCT 29.6 (L) 11/10/2020   MCV 99.3 11/10/2020   PLT 207 11/10/2020   Lab Results  Component Value Date   CREATININE 0.97 11/10/2020   BUN 15 11/10/2020   NA 137 11/10/2020   K 3.7 11/10/2020   CL 104 11/10/2020   CO2 25 11/10/2020   Lab  Results  Component Value Date   ALT 19 11/04/2020   AST 17 11/04/2020   ALKPHOS 50 11/04/2020   BILITOT 0.9 11/04/2020   Lab Results  Component Value Date   CHOL 85 11/07/2020   HDL 21 (L) 11/07/2020   LDLCALC 34 11/07/2020   LDLDIRECT 71.0 01/03/2016   TRIG 150 (H) 11/07/2020   CHOLHDL 4.0 11/07/2020    Lab Results  Component Value Date   HGBA1C 5.6 11/07/2020    Assessment & Plan    1.  Coronary artery disease: Patient recent underwent diagnostic catheterization in the setting of progressive angina was found to have severe left main and RCA disease.  He subsequently underwent CABG x3 with a LIMA to the LAD, vein graft to the OM 2, and vein graft to the RPDA.  Since discharge, he has been feeling well.  He has minimal incisional pain and denies angina or dyspnea.  He remains on aspirin and statin therapy.  Heart rate is mildly elevated at 94 bpm.  Blood pressure stable.  I considered adding a low-dose beta-blocker however, he has had a little bit of orthostatic lightheadedness and is orthostatic on exam today.  I asked him to stop Lasix and potassium chloride, which was due to and tomorrow anyway.  He is interested in cardiac rehabilitation once cleared by surgery.  He has virtual follow-up with surgery tomorrow and office follow-up next month.  2.  Essential hypertension/orthostatic hypotension: He has been having some orthostatic lightheadedness at home and is orthostatic on examination today.  I have asked him to stop his Lasix and potassium, which is due to and tomorrow anyway.  3.  Hyperlipidemia: LDL of 34 with normal LFTs in August.  Continue high potency statin therapy.  4.  Obstructive sleep apnea: Reports compliance with CPAP.  5.  Carotid arterial disease: Mild to moderate bilateral disease by ultrasound in December 2021.  Continue aspirin and statin therapy.  Plan for follow-up ultrasound in December 2022.  6.  Disposition: Follow-up in clinic in 2 months or sooner if  necessary.   Murray Hodgkins, NP 11/16/2020, 12:36 PM

## 2020-11-16 NOTE — Patient Instructions (Signed)
Medication Instructions:  Your physician has recommended you make the following change in your medication:   STOP Furosemide (lasix) STOP Potassium (KCL)  *If you need a refill on your cardiac medications before your next appointment, please call your pharmacy*   Lab Work: None  If you have labs (blood work) drawn today and your tests are completely normal, you will receive your results only by: [place] (if you have MyChart) OR A paper copy in the mail If you have any lab test that is abnormal or we need to change your treatment, we will call you to review the results.   Testing/Procedures: None   Follow-Up: At [facility], you and your health needs are our priority.  As part of our continuing mission to provide you with exceptional heart care, we have created designated Provider Care Teams.  These Care Teams include your primary Cardiologist (physician) and Advanced Practice Providers (APPs -  Physician Assistants and Nurse Practitioners) who all work together to provide you with the care you need, when you need it.   Your next appointment:   2 month(s)  The format for your next appointment:   In Person  Provider:   [name], MD or Murray Hodgkins, NP

## 2020-11-17 ENCOUNTER — Telehealth (INDEPENDENT_AMBULATORY_CARE_PROVIDER_SITE_OTHER): Payer: Self-pay | Admitting: Thoracic Surgery (Cardiothoracic Vascular Surgery)

## 2020-11-17 ENCOUNTER — Encounter: Payer: Self-pay | Admitting: Thoracic Surgery (Cardiothoracic Vascular Surgery)

## 2020-11-17 DIAGNOSIS — Z951 Presence of aortocoronary bypass graft: Secondary | ICD-10-CM

## 2020-11-17 NOTE — Discharge Summary (Signed)
65 year old male with known history of coronary artery disease, essential hypertension, hyperlipidemia and carotid disease who was referred for outpatient cardiac catheterization due to severe anginal symptoms happening with any level of activities. Cardiac catheterization was performed via the right radial artery which showed severe ostial left main stenosis as well as subtotally occluded right coronary artery with left-to-right collaterals.  Given his symptoms and coronary anatomy, I have recommended transfer to [facility] for CABG evaluation.

## 2020-11-17 NOTE — Progress Notes (Signed)
     [address]       [place] [address]             [phone]       Patient: Home Provider: Office Consent for Telemedicine visit obtained.  Today's visit was completed via a real-time telehealth (see specific modality noted below). The patient/authorized person provided oral consent at the time of the visit to engage in a telemedicine encounter with the present provider at [facility]. The patient/authorized person was informed of the potential benefits, limitations, and risks of telemedicine. The patient/authorized person expressed understanding that the laws that protect confidentiality also apply to telemedicine. The patient/authorized person acknowledged understanding that telemedicine does not provide emergency services and that he or she would need to call 911 or proceed to the nearest hospital for help if such a need arose.   Total time spent in the clinical discussion 10 minutes.  Telehealth Modality: Phone visit (audio only)  I had a telephone visit with Hayden Deleon, who is s/p CABG.  Overall doing well.  Ambulating with good O2 sats off oxygen.  He denies any pain.  He has not had any dizziness.  Nurses visit next week to check ambulatory O2 sats and potentially d/c home O2 F/u in 1 month with CXR

## 2020-11-23 ENCOUNTER — Other Ambulatory Visit: Payer: Self-pay

## 2020-11-23 ENCOUNTER — Ambulatory Visit (INDEPENDENT_AMBULATORY_CARE_PROVIDER_SITE_OTHER): Payer: Self-pay | Admitting: *Deleted

## 2020-11-23 VITALS — BP 153/85 | HR 102 | Resp 20

## 2020-11-23 DIAGNOSIS — Z5189 Encounter for other specified aftercare: Secondary | ICD-10-CM

## 2020-11-23 DIAGNOSIS — Z4802 Encounter for removal of sutures: Secondary | ICD-10-CM

## 2020-11-23 NOTE — Progress Notes (Signed)
Patient arrived today per Dr. Kipp Brood for oxygen saturation evaluation and 6 min walk test.  Pre-walk: 102/62 102 HR 91% O2 sat No SOB  Post-walk: 153/85 102 HR 91% O2 sat No SOB  Patient tolerated walk well without SOB. Oxygen saturation during walk did fall to 83-84% and recovered quickly during post walk VS check. Patient walked a total of 768 feet. Advised patient to continue to wear oxygen with ambulation. Patient verbalized understanding. Patient asked about going back to work mid September. Per Dr. Kipp Brood, he will discuss this with the patient at his next follow up. Dr. Kipp Brood aware of walk test results.

## 2020-11-28 DIAGNOSIS — J449 Chronic obstructive pulmonary disease, unspecified: Secondary | ICD-10-CM | POA: Diagnosis not present

## 2020-12-02 ENCOUNTER — Other Ambulatory Visit: Payer: Self-pay | Admitting: Physician Assistant

## 2020-12-05 ENCOUNTER — Telehealth: Payer: Self-pay | Admitting: Internal Medicine

## 2020-12-05 NOTE — Telephone Encounter (Signed)
Spoke to patient, who had questions regarding upcoming appt and SMW. Patient is 4 weeks post bypass surgery. He is concerned about doing SMW.  Patient will keep scheduled OV and discuss SMW at that time.  Nothing further needed at this time.

## 2020-12-06 ENCOUNTER — Telehealth: Payer: Self-pay | Admitting: Internal Medicine

## 2020-12-06 NOTE — Telephone Encounter (Signed)
Lm requesting that patient bring SD card.

## 2020-12-07 ENCOUNTER — Other Ambulatory Visit: Payer: Self-pay | Admitting: Thoracic Surgery (Cardiothoracic Vascular Surgery)

## 2020-12-07 ENCOUNTER — Encounter: Payer: Self-pay | Admitting: Internal Medicine

## 2020-12-07 ENCOUNTER — Ambulatory Visit (INDEPENDENT_AMBULATORY_CARE_PROVIDER_SITE_OTHER): Payer: BC Managed Care – PPO | Admitting: Internal Medicine

## 2020-12-07 ENCOUNTER — Other Ambulatory Visit: Payer: Self-pay

## 2020-12-07 VITALS — BP 106/60 | HR 93 | Temp 98.3°F | Ht 71.0 in | Wt 198.6 lb

## 2020-12-07 DIAGNOSIS — G4733 Obstructive sleep apnea (adult) (pediatric): Secondary | ICD-10-CM

## 2020-12-07 DIAGNOSIS — J449 Chronic obstructive pulmonary disease, unspecified: Secondary | ICD-10-CM

## 2020-12-07 DIAGNOSIS — Z951 Presence of aortocoronary bypass graft: Secondary | ICD-10-CM

## 2020-12-07 MED ORDER — TRELEGY ELLIPTA 100-62.5-25 MCG/INH IN AEPB: 100.0000 ug | INHALATION_SPRAY | Freq: Every day | RESPIRATORY_TRACT | 0 refills | Status: DC

## 2020-12-07 NOTE — Patient Instructions (Addendum)
Continue Trelegy Ventolin HFA as needed Recommend weight loss Continue CPAP therapy as prescribed and follow up with Cardiothoracic surgery

## 2020-12-07 NOTE — Progress Notes (Signed)
[facility] Consultation        Date: 12/07/2020  MRN# [id number] Hayden Deleon June 25, 1955    Hayden Deleon is a 65 y.o. old male seen in consultation for chief complaint of:  SYNOPSIS Established care for COPD and OSA  **CPAP download 07/2019 Excellent compliance report CPAP set at 7 AHI down to 2.6 93% for greater than 4 hours 9% for days  **CPAP download 05/22/2018- 08/19/2018.>>  Raw data present reviewed.  Average usage on days used is 6 hours 40 minutes.  Usage greater than 4 hours is 77/90 days.  CPAP set at 7.  Leaks are within normal limits.  Residual AHI is 2.  Overall this shows good compliance with CPAP with excellent control obstructive sleep apnea. **Pneumovax administered 12/30/16 **PFT 12/24/16; Spirometry: FVC was 62% of predicted, FEV1 was 33% of predicted, ratio is 43%. Flow volume loop is consistent with obstruction. There is significant improvement with bronchodilator therapy. Lung volumes: TLC is 78% of predicted, RV to TLC ratio was normal. DLCO is 49% of predicted. -Overall this test is consistent with severe COPD with reversibility, there may also be an element of mild restrictive lung disease.  **CBC 08/22/16; Eos=600 during an episode of AECOPD. At baseline he is at 300.   **Alpha-1 09/30/16>> normal.  **CT chest 09/06/16 shows severe diffuse panlobular emphysema, with some left basilar scarring, left pleural thickening, small effusion with leftward mediastinal shift.. **Sleep study 05/30/12>>AHI 35  12/28/2012 PSG showed -AHI 28 times/h  He did not sleep much on CPAP - start CPAP 7cm , pillows, humidity,  Download 10/28/12 on 7 cm - AHI 5/h, good usage, no leak  LAST OV DOWNLOAD excellent compliance report Patient uses CPAP 7 cm water pressure 90% compliance for greater than 4 hours 93% compliance for days there is no leak uses nasal pillows Previous AHI was 35    CC Follow-up OSA Follow-up COPD   HPI:   Follow-up assessment of  COPD Chronic shortness of breath and dyspnea exertion seems to be stable Currently on triple therapy with Trelegy Previous history of COPD exacerbations Gold stage D  No exacerbation at this time No evidence of heart failure at this time No evidence or signs of infection at this time No respiratory distress No fevers, chills, nausea, vomiting, diarrhea No evidence of lower extremity edema No evidence hemoptysis  Regarding OSA Previous download shows excellent compliance with well-controlled sleep apnea AHI reduced to 1.8   Previous weight 237 Current weight is 217 pounds goal weight next visit will be 195 pounds   s/p CABG 1 month out Doing well post op   Review of Systems:  Gen:  Denies  fever, sweats, chills weight loss  HEENT: Denies blurred vision, double vision, ear pain, eye pain, hearing loss, nose bleeds, sore throat Cardiac:  No dizziness, chest pain or heaviness, chest tightness,edema, No JVD Resp:   No cough, -sputum production, -shortness of breath,-wheezing, -hemoptysis,  Other:  All other systems negative  BP 106/60 (BP Location: Left Arm, Patient Position: Sitting, Cuff Size: Normal)   Pulse 93   Temp 98.3 F (36.8 C) (Oral)   Ht '5\' 11"'$  (1.803 m)   Wt 198 lb 9.6 oz (90.1 kg)   SpO2 93%   BMI 27.70 kg/m    Physical Examination:   General Appearance: No distress  EYES PERRLA, EOM intact.   NECK Supple, No JVD Pulmonary: normal breath sounds, No wheezing.  CardiovascularNormal S1,S2.  No m/r/g.   Abdomen:  Benign, Soft, non-tender. Skin:   midline scar looks good, no infecion Extremities: normal, no cyanosis, clubbing. Neuro:without focal findings,  speech normal  PSYCHIATRIC: Mood, affect within normal limits.   ALL OTHER ROS ARE NEGATIVE    Allergies:  Avelox [moxifloxacin hcl in nacl], Sulfamethoxazole-trimethoprim, Imdur [isosorbide nitrate], and Aspirin   Assessment and Plan:  65 year old pleasant white male seen today for follow-up  severe COPD based on pulmonary function testing of FEV1 33% predicted with underlying severe sleep apnea in the setting of obesity and deconditioned state   Shortness of breath and dyspnea on exertion  Seems to be stable  S/p CABG Follow up cardiothoracic surgery  severe Gold stage D Continue Trelegy therapy Albuterol as needed Avoid secondhand smoke exposure Pneumovax administered 2016 Prevnar at age 45 Alpha-1 normal limits No exacerbation at this time  Regarding OSA Previous download showed excellent compliance report Current download....till 11/09/2020 Patient is and benefits from therapy AHI reduced to 1.8 Excellent compliance  Obesity -recommend significant weight loss -recommend changing diet -recommend significant weight loss -recommend changing diet 237-->215--> current weight 198 Next weight goal 185/190    Deconditioned state -Recommend increased daily activity and exercise   MEDICATION ADJUSTMENTS/LABS AND TESTS ORDERED: Continue Trelegy Ventolin HFA as needed Continue weight loss Continue CPAP therapy  Patient satisfied with Plan of action and management. All questions answered  Follow-up in 1 year  Total time spent 25 minutes    Alaiyah Bollman Patricia Pesa, M.D.  [facility]  Medical Director [place] Director [facility] Cardio-Pulmonary Department

## 2020-12-08 ENCOUNTER — Ambulatory Visit (INDEPENDENT_AMBULATORY_CARE_PROVIDER_SITE_OTHER): Payer: Self-pay | Admitting: Physician Assistant

## 2020-12-08 ENCOUNTER — Ambulatory Visit
Admission: RE | Admit: 2020-12-08 | Discharge: 2020-12-08 | Disposition: A | Discharge status: Home / Self Care | Payer: BC Managed Care – PPO | Source: Ambulatory Visit | Attending: Thoracic Surgery (Cardiothoracic Vascular Surgery) | Admitting: Thoracic Surgery (Cardiothoracic Vascular Surgery)

## 2020-12-08 ENCOUNTER — Encounter: Payer: Self-pay | Admitting: Physician Assistant

## 2020-12-08 ENCOUNTER — Other Ambulatory Visit: Payer: Self-pay | Admitting: *Deleted

## 2020-12-08 VITALS — BP 127/85 | HR 95 | Resp 20 | Ht 71.0 in | Wt 197.0 lb

## 2020-12-08 DIAGNOSIS — J439 Emphysema, unspecified: Secondary | ICD-10-CM | POA: Diagnosis not present

## 2020-12-08 DIAGNOSIS — Z951 Presence of aortocoronary bypass graft: Secondary | ICD-10-CM | POA: Diagnosis not present

## 2020-12-08 DIAGNOSIS — Z5189 Encounter for other specified aftercare: Secondary | ICD-10-CM

## 2020-12-08 NOTE — Progress Notes (Signed)
[address]       [place] [address]             [phone]    Hayden Deleon is a 65 y.o. male patient s/p CABG x 3 on 11/05/2020 with Dr. Kipp Brood. He was seen virtually on 8/19 by Dr. Kipp Brood. He was doing well at this time. He presents today for his routine in-person office visit about a month after surgery. Over all he is doing well. He is walking over a mile a day. He has been doing some work from home as tolerated. Overall, he feels good and has no complaints today. He has not needed narcotic pain medication.     1. S/P CABG x 3   2. Visit for wound check    Past Medical History:  Diagnosis Date   Allergy    Anxiety    Arthritis    right shoulder   CAD (coronary artery disease)    a. 2003 Cath: OM1 100%, good collats, RCA 60-70p-->med rx; b. 04/2020 MV: mild ischemia to sm area of mid inflat & apical lat segments. EF >65%; d. 10/2020 Cath: LM 85ost, LAD 30p/m, OM1 100, RCA 99p/m; e. 10/2020 CABG x 3: LIMA->LAD, VG->RPDA, VG->OM2.   Carotid artery disease ([place])    a. 03/2020 Carotid U/S: RICA 123456, LICA 123456.   COPD (chronic obstructive pulmonary disease) ([place])    Significant Dr. Lamonte Sakai   Diastolic dysfunction    a. 03/2018 Echo: EF 60-65%, no rwma, Gr1 DD. Mildly dil LA. Nl RV fxn.   Dilation of ascending aorta and aortic root ([place])    a. 10/2020 Echo: Asc Ao 65m.   Diverticulosis of colon    GERD (gastroesophageal reflux disease)    Gout    Hyperlipidemia    Hypertension    LVH (left ventricular hypertrophy)    a. 10/2020 Echo: EF 70-75%, no rwma, mod LVH. Nl RV size/fxn. Mildly dil RA. Triv MR. Asc Ao 447m   Prostatitis    Sleep apnea    uses cpap nightly   No past surgical history pertinent negatives on file. Scheduled Meds: Current Outpatient Medications on File Prior to Visit  Medication Sig Dispense Refill   albuterol (VENTOLIN HFA) 108 (90 Base) MCG/ACT inhaler TAKE 2 PUFFS BY MOUTH EVERY 6 HOURS AS NEEDED FOR WHEEZE OR SHORTNESS  OF BREATH 6.7 each 10   aspirin EC 325 MG EC tablet Take 1 tablet (325 mg total) by mouth daily. 30 tablet 0   atorvastatin (LIPITOR) 80 MG tablet Take 1 tablet (80 mg total) by mouth daily. 30 tablet 2   Cyanocobalamin (B-12) 3000 MCG SUBL Place 3,000 mcg under the tongue daily.     Fluticasone-Umeclidin-Vilant (TRELEGY ELLIPTA) 100-62.5-25 MCG/INH AEPB Inhale 100 mcg into the lungs daily. 28 each 0   guaiFENesin (MUCINEX) 600 MG 12 hr tablet Take 1 tablet (600 mg total) by mouth 2 (two) times daily. 30 tablet PRN   Multiple Vitamin (MULTIVITAMIN) capsule Take 1 capsule by mouth daily.     nitroGLYCERIN (NITROSTAT) 0.4 MG SL tablet Place 0.4 mg under the tongue every 5 (five) minutes as needed for chest pain.     sertraline (ZOLOFT) 50 MG tablet TAKE 1 TABLET BY MOUTH EVERY DAY 90 tablet 1   TRELEGY ELLIPTA 100-62.5-25 MCG/INH AEPB TAKE 1 PUFF BY MOUTH EVERY DAY 180 each 2   No current facility-administered medications on file prior to visit.     Allergies  Allergen  Reactions   Avelox [Moxifloxacin Hcl In Nacl] Other (See Comments)    Facial swelling   Sulfamethoxazole-Trimethoprim Other (See Comments)    REACTION: sore mouth   Imdur [Isosorbide Nitrate] Other (See Comments)    Made pt feel groggy, loopy, bad dreams    Aspirin Other (See Comments)    REACTION: Nausea; fine with '81mg'$    Active Problems:   * No active hospital problems. *  Vitals:   12/08/20 1108  BP: 127/85  Pulse: 95  Resp: 20  SpO2: 95%     Subjective Objective: Vital signs (most recent): Blood pressure 127/85, pulse 95, resp. rate 20, height '5\' 11"'$  (1.803 m), weight 197 lb (89.4 kg), SpO2 95 %.  Cor: RRR, no murmur Pulm: CTA bilaterally and in all fields Abd: no tenderness Wound: well healed sternal incision and left EVH site Ext: no edema  Assessment & Plan  Mr. Sifuentez is s/p CABG x 3 about a month ago. He is doing well overall and has been walking over a mile a day. He has not had any significant  shortness of breath or chest pain. The only pain he has is when he coughs or sneezes. He has not needed narcotic pain medication therefore, I have cleared him to drive short distances. He is advised to drive during the day and avoid night driving for a while. We discussed cardiac and pulmonary rehab and he wants to do this at [facility]. A referral was sent today. He is encouraged to continue ambulation and slowly increasing his weight limitation by about 2 lbs a week.   We discussed return to work and decided 9/19 would be a good return day. This puts him 6 weeks from his surgery. He is encouraged to start back part-time and delicate any tasks involving heavy lifting or flour-filled environments. After a few weeks if he is comfortable he can return full time. We discussed if work requires any long-distance travel he is to post-pone this until later in Oct, closer to the 3 month mark.    Plan: No medication changes were made during this visit. He does not need to return for any routine follow-up. He is to reach out to our office if he has any questions or concerns that arise.    Elgie Collard 12/08/2020

## 2020-12-11 DIAGNOSIS — J449 Chronic obstructive pulmonary disease, unspecified: Secondary | ICD-10-CM | POA: Diagnosis not present

## 2020-12-22 ENCOUNTER — Ambulatory Visit: Payer: BC Managed Care – PPO | Admitting: Thoracic Surgery (Cardiothoracic Vascular Surgery)

## 2020-12-22 ENCOUNTER — Encounter: Payer: BC Managed Care – PPO | Attending: Cardiovascular Disease | Admitting: *Deleted

## 2020-12-22 ENCOUNTER — Other Ambulatory Visit: Payer: Self-pay

## 2020-12-22 ENCOUNTER — Encounter: Payer: Self-pay | Admitting: *Deleted

## 2020-12-22 DIAGNOSIS — Z951 Presence of aortocoronary bypass graft: Secondary | ICD-10-CM | POA: Insufficient documentation

## 2020-12-22 DIAGNOSIS — Z48812 Encounter for surgical aftercare following surgery on the circulatory system: Secondary | ICD-10-CM | POA: Insufficient documentation

## 2020-12-22 NOTE — Progress Notes (Signed)
Initial telephone orientation completed. Diagnosis can be found in [facility] 8/5. EP orientation scheduled for Thursday 9/29 at 2pm.

## 2020-12-28 ENCOUNTER — Other Ambulatory Visit: Payer: Self-pay

## 2020-12-28 VITALS — Ht 71.0 in | Wt 196.3 lb

## 2020-12-28 DIAGNOSIS — Z48812 Encounter for surgical aftercare following surgery on the circulatory system: Secondary | ICD-10-CM | POA: Diagnosis not present

## 2020-12-28 DIAGNOSIS — Z951 Presence of aortocoronary bypass graft: Secondary | ICD-10-CM

## 2020-12-28 NOTE — Patient Instructions (Signed)
Patient Instructions  Patient Details  Name: Hayden Deleon MRN: [id number] Date of Birth: 1956/02/22 Referring Provider:  Wellington Hampshire, MD  Below are your personal goals for exercise, nutrition, and risk factors. Our goal is to help you stay on track towards obtaining and maintaining these goals. We will be discussing your progress on these goals with you throughout the program.  Initial Exercise Prescription:  Initial Exercise Prescription - 12/28/20 1500       Date of Initial Exercise RX and Referring Provider   Date 12/28/20    Referring Provider Arida      Treadmill   MPH 2    Grade 1.5    Minutes 15    METs 3      NuStep   Level 3    SPM 80    Minutes 15    METs 3      Elliptical   Level 1    Speed 2.5    Minutes 15    METs 3      REL-XR   Level 3    Speed 50    Minutes 15    METs 3      T5 Nustep   Level 2    SPM 80    Minutes 15    METs 3      Prescription Details   Frequency (times per week) 3    Duration Progress to 30 minutes of continuous aerobic without signs/symptoms of physical distress      Intensity   THRR 40-80% of Max Heartrate 110-140    Ratings of Perceived Exertion 11-15    Perceived Dyspnea 0-4      Resistance Training   Training Prescription Yes    Weight 3 lb    Reps 10-15             Exercise Goals: Frequency: Be able to perform aerobic exercise two to three times per week in program working toward 2-5 days per week of home exercise.  Intensity: Work with a perceived exertion of 11 (fairly light) - 15 (hard) while following your exercise prescription.  We will make changes to your prescription with you as you progress through the program.   Duration: Be able to do 30 to 45 minutes of continuous aerobic exercise in addition to a 5 minute warm-up and a 5 minute cool-down routine.   Nutrition Goals: Your personal nutrition goals will be established when you do your nutrition analysis with the dietician.  The  following are general nutrition guidelines to follow: Cholesterol < 200mg /day Sodium < 1500mg /day Fiber: Men over 50 yrs - 30 grams per day  Personal Goals:  Personal Goals and Risk Factors at Admission - 12/28/20 1530       Core Components/Risk Factors/Patient Goals on Admission   Hypertension Yes    Intervention Provide education on lifestyle modifcations including regular physical activity/exercise, weight management, moderate sodium restriction and increased consumption of fresh fruit, vegetables, and low fat dairy, alcohol moderation, and smoking cessation.;Monitor prescription use compliance.    Expected Outcomes Short Term: Continued assessment and intervention until BP is < 140/51mm HG in hypertensive participants. < 130/44mm HG in hypertensive participants with diabetes, heart failure or chronic kidney disease.;Long Term: Maintenance of blood pressure at goal levels.    Lipids Yes    Intervention Provide education and support for participant on nutrition & aerobic/resistive exercise along with prescribed medications to achieve LDL 70mg , HDL >40mg .    Expected Outcomes Short Term: Participant  states understanding of desired cholesterol values and is compliant with medications prescribed. Participant is following exercise prescription and nutrition guidelines.;Long Term: Cholesterol controlled with medications as prescribed, with individualized exercise RX and with personalized nutrition plan. Value goals: LDL < 70mg , HDL > 40 mg.             Tobacco Use Initial Evaluation: Social History   Tobacco Use  Smoking Status Former   Packs/day: 2.00   Years: 32.00   Pack years: 64.00   Types: Cigarettes   Quit date: 08/31/1999   Years since quitting: 21.3  Smokeless Tobacco Former    Exercise Goals and Review:  Exercise Goals     Row Name 12/28/20 1528             Exercise Goals   Increase Physical Activity Yes       Intervention Provide advice, education, support and  counseling about physical activity/exercise needs.;Develop an individualized exercise prescription for aerobic and resistive training based on initial evaluation findings, risk stratification, comorbidities and participant's personal goals.       Expected Outcomes Short Term: Attend rehab on a regular basis to increase amount of physical activity.;Long Term: Add in home exercise to make exercise part of routine and to increase amount of physical activity.;Long Term: Exercising regularly at least 3-5 days a week.       Increase Strength and Stamina Yes       Intervention Provide advice, education, support and counseling about physical activity/exercise needs.;Develop an individualized exercise prescription for aerobic and resistive training based on initial evaluation findings, risk stratification, comorbidities and participant's personal goals.       Expected Outcomes Short Term: Increase workloads from initial exercise prescription for resistance, speed, and METs.;Short Term: Perform resistance training exercises routinely during rehab and add in resistance training at home;Long Term: Improve cardiorespiratory fitness, muscular endurance and strength as measured by increased METs and functional capacity (6MWT)       Able to understand and use rate of perceived exertion (RPE) scale Yes       Intervention Provide education and explanation on how to use RPE scale       Expected Outcomes Short Term: Able to use RPE daily in rehab to express subjective intensity level;Long Term:  Able to use RPE to guide intensity level when exercising independently       Able to understand and use Dyspnea scale Yes       Intervention Provide education and explanation on how to use Dyspnea scale       Expected Outcomes Short Term: Able to use Dyspnea scale daily in rehab to express subjective sense of shortness of breath during exertion;Long Term: Able to use Dyspnea scale to guide intensity level when exercising independently        Knowledge and understanding of Target Heart Rate Range (THRR) Yes       Intervention Provide education and explanation of THRR including how the numbers were predicted and where they are located for reference       Expected Outcomes Short Term: Able to state/look up THRR;Short Term: Able to use daily as guideline for intensity in rehab;Long Term: Able to use THRR to govern intensity when exercising independently       Able to check pulse independently Yes       Intervention Provide education and demonstration on how to check pulse in carotid and radial arteries.;Review the importance of being able to check your own pulse for safety during independent  exercise       Expected Outcomes Short Term: Able to explain why pulse checking is important during independent exercise;Long Term: Able to check pulse independently and accurately       Understanding of Exercise Prescription Yes       Intervention Provide education, explanation, and written materials on patient's individual exercise prescription       Expected Outcomes Short Term: Able to explain program exercise prescription;Long Term: Able to explain home exercise prescription to exercise independently                Copy of goals given to participant.

## 2020-12-28 NOTE — Progress Notes (Signed)
Cardiac Individual Treatment Plan  Patient Details  Name: Hayden Deleon MRN: [id number] Date of Birth: 03/21/1956 Referring Provider:   Flowsheet Row Cardiac Rehab from 12/28/2020 in [facility] Cardiac and Pulmonary Rehab  Referring Provider Arida       Initial Encounter Date:  Flowsheet Row Cardiac Rehab from 12/28/2020 in [facility] Cardiac and Pulmonary Rehab  Date 12/28/20       Visit Diagnosis: S/P CABG x 3  Patient's Home Medications on Admission:  Current Outpatient Medications:    albuterol (VENTOLIN HFA) 108 (90 Base) MCG/ACT inhaler, TAKE 2 PUFFS BY MOUTH EVERY 6 HOURS AS NEEDED FOR WHEEZE OR SHORTNESS OF BREATH, Disp: 6.7 each, Rfl: 10   aspirin EC 325 MG EC tablet, Take 1 tablet (325 mg total) by mouth daily., Disp: 30 tablet, Rfl: 0   atorvastatin (LIPITOR) 80 MG tablet, Take 1 tablet (80 mg total) by mouth daily., Disp: 30 tablet, Rfl: 2   Cyanocobalamin (B-12) 3000 MCG SUBL, Place 3,000 mcg under the tongue daily., Disp: , Rfl:    Fluticasone-Umeclidin-Vilant (TRELEGY ELLIPTA) 100-62.5-25 MCG/INH AEPB, Inhale 100 mcg into the lungs daily., Disp: 28 each, Rfl: 0   guaiFENesin (MUCINEX) 600 MG 12 hr tablet, Take 1 tablet (600 mg total) by mouth 2 (two) times daily., Disp: 30 tablet, Rfl: PRN   Multiple Vitamin (MULTIVITAMIN) capsule, Take 1 capsule by mouth daily., Disp: , Rfl:    nitroGLYCERIN (NITROSTAT) 0.4 MG SL tablet, Place 0.4 mg under the tongue every 5 (five) minutes as needed for chest pain., Disp: , Rfl:    sertraline (ZOLOFT) 50 MG tablet, TAKE 1 TABLET BY MOUTH EVERY DAY, Disp: 90 tablet, Rfl: 1   TRELEGY ELLIPTA 100-62.5-25 MCG/INH AEPB, TAKE 1 PUFF BY MOUTH EVERY DAY, Disp: 180 each, Rfl: 2  Past Medical History: Past Medical History:  Diagnosis Date   Allergy    Anxiety    Arthritis    right shoulder   CAD (coronary artery disease)    a. 2003 Cath: OM1 100%, good collats, RCA 60-70p-->med rx; b. 04/2020 MV: mild ischemia to sm area of mid inflat & apical lat  segments. EF >65%; d. 10/2020 Cath: LM 85ost, LAD 30p/m, OM1 100, RCA 99p/m; e. 10/2020 CABG x 3: LIMA->LAD, VG->RPDA, VG->OM2.   Carotid artery disease ([place])    a. 03/2020 Carotid U/S: RICA 4-27%, LICA 06-23%.   COPD (chronic obstructive pulmonary disease) ([place])    Significant Dr. Lamonte Sakai   Diastolic dysfunction    a. 03/2018 Echo: EF 60-65%, no rwma, Gr1 DD. Mildly dil LA. Nl RV fxn.   Dilation of ascending aorta and aortic root ([place])    a. 10/2020 Echo: Asc Ao 43mm.   Diverticulosis of colon    GERD (gastroesophageal reflux disease)    Gout    Hyperlipidemia    Hypertension    LVH (left ventricular hypertrophy)    a. 10/2020 Echo: EF 70-75%, no rwma, mod LVH. Nl RV size/fxn. Mildly dil RA. Triv MR. Asc Ao 46mm.   Prostatitis    Sleep apnea    uses cpap nightly    Tobacco Use: Social History   Tobacco Use  Smoking Status Former   Packs/day: 2.00   Years: 32.00   Pack years: 64.00   Types: Cigarettes   Quit date: 08/31/1999   Years since quitting: 21.3  Smokeless Tobacco Former    Labs: Recent Government social research officer for [employer] Cardiac and Pulmonary Rehab Latest Ref Rng & Units 11/05/2020 11/05/2020  11/06/2020 11/06/2020 11/07/2020   Cholestrol 0 - 200 mg/dL - - - - 85   LDLCALC 0 - 99 mg/dL - - - - 34   LDLDIRECT mg/dL - - - - -   HDL >40 mg/dL - - - - 21(L)   Trlycerides <150 mg/dL - - - - 150(H)   Hemoglobin A1c 4.8 - 5.6 % - - - - 5.6   PHART 7.350 - 7.450 7.194(LL) 7.234(L) 7.389 7.362 -   PCO2ART 32.0 - 48.0 mmHg 56.3(H) 50.1(H) 32.8 35.2 -   HCO3 20.0 - 28.0 mmol/L 21.8 21.0 19.8(L) 19.9(L) -   TCO2 22 - 32 mmol/L 23 22 21(L) 21(L) -   ACIDBASEDEF 0.0 - 2.0 mmol/L 6.0(H) 6.0(H) 5.0(H) 5.0(H) -   O2SAT % 92.0 87.0 95.0 89.0 -        Exercise Target Goals: Exercise Program Goal: Individual exercise prescription set using results from initial 6 min walk test and THRR while considering  patient's activity barriers and safety.   Exercise Prescription Goal: Initial  exercise prescription builds to 30-45 minutes a day of aerobic activity, 2-3 days per week.  Home exercise guidelines will be given to patient during program as part of exercise prescription that the participant will acknowledge.   Education: Aerobic Exercise: - Group verbal and visual presentation on the components of exercise prescription. Introduces F.I.T.T principle from ACSM for exercise prescriptions.  Reviews F.I.T.T. principles of aerobic exercise including progression. Written material given at graduation. Flowsheet Row Cardiac Rehab from 12/28/2020 in [facility] Cardiac and Pulmonary Rehab  Education need identified 12/28/20       Education: Resistance Exercise: - Group verbal and visual presentation on the components of exercise prescription. Introduces F.I.T.T principle from ACSM for exercise prescriptions  Reviews F.I.T.T. principles of resistance exercise including progression. Written material given at graduation.    Education: Exercise & Equipment Safety: - Individual verbal instruction and demonstration of equipment use and safety with use of the equipment. Flowsheet Row Cardiac Rehab from 12/28/2020 in [facility] Cardiac and Pulmonary Rehab  Date 12/28/20  Educator AS  Instruction Review Code 1- Verbalizes Understanding       Education: Exercise Physiology & General Exercise Guidelines: - Group verbal and written instruction with models to review the exercise physiology of the cardiovascular system and associated critical values. Provides general exercise guidelines with specific guidelines to those with heart or lung disease.    Education: Flexibility, Balance, Mind/Body Relaxation: - Group verbal and visual presentation with interactive activity on the components of exercise prescription. Introduces F.I.T.T principle from ACSM for exercise prescriptions. Reviews F.I.T.T. principles of flexibility and balance exercise training including progression. Also discusses the mind body  connection.  Reviews various relaxation techniques to help reduce and manage stress (i.e. Deep breathing, progressive muscle relaxation, and visualization). Balance handout provided to take home. Written material given at graduation.   Activity Barriers & Risk Stratification:  Activity Barriers & Cardiac Risk Stratification - 12/22/20 1004       Activity Barriers & Cardiac Risk Stratification   Activity Barriers Other (comment)    Comments COPD; history of shoulder replacement (doesn't cause issues)    Cardiac Risk Stratification High             6 Minute Walk:  6 Minute Walk     Row Name 12/28/20 1525         6 Minute Walk   Phase Initial     Distance 1115 feet     Walk Time 6  minutes     # of Rest Breaks 0     MPH 2.1     METS 3     RPE 11     Perceived Dyspnea  0     VO2 Peak 10.5     Symptoms No     Resting HR 81 bpm     Resting BP 138/66     Resting Oxygen Saturation  95 %     Exercise Oxygen Saturation  during 6 min walk 91 %     Max Ex. HR 97 bpm     Max Ex. BP 160/72     2 Minute Post BP 130/66              Oxygen Initial Assessment:   Oxygen Re-Evaluation:   Oxygen Discharge (Final Oxygen Re-Evaluation):   Initial Exercise Prescription:  Initial Exercise Prescription - 12/28/20 1500       Date of Initial Exercise RX and Referring Provider   Date 12/28/20    Referring Provider Arida      Treadmill   MPH 2    Grade 1.5    Minutes 15    METs 3      NuStep   Level 3    SPM 80    Minutes 15    METs 3      Elliptical   Level 1    Speed 2.5    Minutes 15    METs 3      REL-XR   Level 3    Speed 50    Minutes 15    METs 3      T5 Nustep   Level 2    SPM 80    Minutes 15    METs 3      Prescription Details   Frequency (times per week) 3    Duration Progress to 30 minutes of continuous aerobic without signs/symptoms of physical distress      Intensity   THRR 40-80% of Max Heartrate 110-140    Ratings of Perceived  Exertion 11-15    Perceived Dyspnea 0-4      Resistance Training   Training Prescription Yes    Weight 3 lb    Reps 10-15             Perform Capillary Blood Glucose checks as needed.  Exercise Prescription Changes:   Exercise Prescription Changes     Row Name 12/28/20 1500             Response to Exercise   Blood Pressure (Admit) 138/66       Blood Pressure (Exercise) 160/72       Blood Pressure (Exit) 130/66       Heart Rate (Admit) 81 bpm       Heart Rate (Exercise) 97 bpm       Heart Rate (Exit) 87 bpm       Oxygen Saturation (Admit) 95 %       Oxygen Saturation (Exercise) 91 %       Rating of Perceived Exertion (Exercise) 11       Perceived Dyspnea (Exercise) 0       Symptoms none                Exercise Comments:   Exercise Goals and Review:   Exercise Goals     Row Name 12/28/20 1528             Exercise Goals   Increase Physical  Activity Yes       Intervention Provide advice, education, support and counseling about physical activity/exercise needs.;Develop an individualized exercise prescription for aerobic and resistive training based on initial evaluation findings, risk stratification, comorbidities and participant's personal goals.       Expected Outcomes Short Term: Attend rehab on a regular basis to increase amount of physical activity.;Long Term: Add in home exercise to make exercise part of routine and to increase amount of physical activity.;Long Term: Exercising regularly at least 3-5 days a week.       Increase Strength and Stamina Yes       Intervention Provide advice, education, support and counseling about physical activity/exercise needs.;Develop an individualized exercise prescription for aerobic and resistive training based on initial evaluation findings, risk stratification, comorbidities and participant's personal goals.       Expected Outcomes Short Term: Increase workloads from initial exercise prescription for resistance,  speed, and METs.;Short Term: Perform resistance training exercises routinely during rehab and add in resistance training at home;Long Term: Improve cardiorespiratory fitness, muscular endurance and strength as measured by increased METs and functional capacity (6MWT)       Able to understand and use rate of perceived exertion (RPE) scale Yes       Intervention Provide education and explanation on how to use RPE scale       Expected Outcomes Short Term: Able to use RPE daily in rehab to express subjective intensity level;Long Term:  Able to use RPE to guide intensity level when exercising independently       Able to understand and use Dyspnea scale Yes       Intervention Provide education and explanation on how to use Dyspnea scale       Expected Outcomes Short Term: Able to use Dyspnea scale daily in rehab to express subjective sense of shortness of breath during exertion;Long Term: Able to use Dyspnea scale to guide intensity level when exercising independently       Knowledge and understanding of Target Heart Rate Range (THRR) Yes       Intervention Provide education and explanation of THRR including how the numbers were predicted and where they are located for reference       Expected Outcomes Short Term: Able to state/look up THRR;Short Term: Able to use daily as guideline for intensity in rehab;Long Term: Able to use THRR to govern intensity when exercising independently       Able to check pulse independently Yes       Intervention Provide education and demonstration on how to check pulse in carotid and radial arteries.;Review the importance of being able to check your own pulse for safety during independent exercise       Expected Outcomes Short Term: Able to explain why pulse checking is important during independent exercise;Long Term: Able to check pulse independently and accurately       Understanding of Exercise Prescription Yes       Intervention Provide education, explanation, and written  materials on patient's individual exercise prescription       Expected Outcomes Short Term: Able to explain program exercise prescription;Long Term: Able to explain home exercise prescription to exercise independently                Exercise Goals Re-Evaluation :   Discharge Exercise Prescription (Final Exercise Prescription Changes):  Exercise Prescription Changes - 12/28/20 1500       Response to Exercise   Blood Pressure (Admit) 138/66    Blood  Pressure (Exercise) 160/72    Blood Pressure (Exit) 130/66    Heart Rate (Admit) 81 bpm    Heart Rate (Exercise) 97 bpm    Heart Rate (Exit) 87 bpm    Oxygen Saturation (Admit) 95 %    Oxygen Saturation (Exercise) 91 %    Rating of Perceived Exertion (Exercise) 11    Perceived Dyspnea (Exercise) 0    Symptoms none             Nutrition:  Target Goals: Understanding of nutrition guidelines, daily intake of sodium 1500mg , cholesterol 200mg , calories 30% from fat and 7% or less from saturated fats, daily to have 5 or more servings of fruits and vegetables.  Education: All About Nutrition: -Group instruction provided by verbal, written material, interactive activities, discussions, models, and posters to present general guidelines for heart healthy nutrition including fat, fiber, MyPlate, the role of sodium in heart healthy nutrition, utilization of the nutrition label, and utilization of this knowledge for meal planning. Follow up email sent as well. Written material given at graduation. Flowsheet Row Cardiac Rehab from 12/28/2020 in [facility] Cardiac and Pulmonary Rehab  Education need identified 12/28/20       Biometrics:  Pre Biometrics - 12/28/20 1530       Pre Biometrics   Height 5\' 11"  (1.803 m)    Weight 196 lb 4.8 oz (89 kg)    BMI (Calculated) 27.39    Single Leg Stand 30 seconds              Nutrition Therapy Plan and Nutrition Goals:   Nutrition Assessments:  MEDIFICTS Score Key: ?70 Need to make  dietary changes  40-70 Heart Healthy Diet ? 40 Therapeutic Level Cholesterol Diet  Flowsheet Row Cardiac Rehab from 12/28/2020 in [facility] Cardiac and Pulmonary Rehab  Picture Your Plate Total Score on Admission 74      Picture Your Plate Scores: <59 Unhealthy dietary pattern with much room for improvement. 41-50 Dietary pattern unlikely to meet recommendations for good health and room for improvement. 51-60 More healthful dietary pattern, with some room for improvement.  >60 Healthy dietary pattern, although there may be some specific behaviors that could be improved.    Nutrition Goals Re-Evaluation:   Nutrition Goals Discharge (Final Nutrition Goals Re-Evaluation):   Psychosocial: Target Goals: Acknowledge presence or absence of significant depression and/or stress, maximize coping skills, provide positive support system. Participant is able to verbalize types and ability to use techniques and skills needed for reducing stress and depression.   Education: Stress, Anxiety, and Depression - Group verbal and visual presentation to define topics covered.  Reviews how body is impacted by stress, anxiety, and depression.  Also discusses healthy ways to reduce stress and to treat/manage anxiety and depression.  Written material given at graduation.   Education: Sleep Hygiene -Provides group verbal and written instruction about how sleep can affect your health.  Define sleep hygiene, discuss sleep cycles and impact of sleep habits. Review good sleep hygiene tips.    Initial Review & Psychosocial Screening:  Initial Psych Review & Screening - 12/22/20 1007       Initial Review   Current issues with None Identified      Family Dynamics   Good Support System? Yes   wife, mother in law, friends, coworkers     Barriers   Psychosocial barriers to participate in program There are no identifiable barriers or psychosocial needs.      Screening Interventions   Interventions Encouraged to  exercise;To provide support and resources with identified psychosocial needs;Provide feedback about the scores to participant    Expected Outcomes Short Term goal: Utilizing psychosocial counselor, staff and physician to assist with identification of specific Stressors or current issues interfering with healing process. Setting desired goal for each stressor or current issue identified.;Long Term Goal: Stressors or current issues are controlled or eliminated.;Long Term goal: The participant improves quality of Life and PHQ9 Scores as seen by post scores and/or verbalization of changes;Short Term goal: Identification and review with participant of any Quality of Life or Depression concerns found by scoring the questionnaire.             Quality of Life Scores:   Quality of Life - 12/28/20 1521       Quality of Life   Select Quality of Life      Quality of Life Scores   Health/Function Pre 27.86 %    Socioeconomic Pre 25.5 %    Psych/Spiritual Pre 30 %    Family Pre 25.8 %    GLOBAL Pre 27.86 %            Scores of 19 and below usually indicate a poorer quality of life in these areas.  A difference of  2-3 points is a clinically meaningful difference.  A difference of 2-3 points in the total score of the Quality of Life Index has been associated with significant improvement in overall quality of life, self-image, physical symptoms, and general health in studies assessing change in quality of life.  PHQ-9: Recent Review Flowsheet Data     Depression screen [facility] 2/9 12/28/2020 12/21/2018 04/02/2017   Decreased Interest 0 0 0   Down, Depressed, Hopeless 0 0 0   PHQ - 2 Score 0 0 0   Altered sleeping 0 - -   Tired, decreased energy 1 - -   Change in appetite 0 - -   Feeling bad or failure about yourself  0 - -   Trouble concentrating 0 - -   Moving slowly or fidgety/restless 0 - -   Suicidal thoughts 0 - -   PHQ-9 Score 1 - -   Difficult doing work/chores Not difficult at all - -       Interpretation of Total Score  Total Score Depression Severity:  1-4 = Minimal depression, 5-9 = Mild depression, 10-14 = Moderate depression, 15-19 = Moderately severe depression, 20-27 = Severe depression   Psychosocial Evaluation and Intervention:  Psychosocial Evaluation - 12/22/20 1027       Psychosocial Evaluation & Interventions   Interventions Encouraged to exercise with the program and follow exercise prescription    Comments Ray reports feeling quite well post CABG. He has noted his COPD symptoms feel better as well. He has been walking a few miles a week and is back to work part time. 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 His wife, mother in law, friends and coworkers have been a great support system during his recovery. He and his wife have been making a lot of diet changes since surgery and he has lost 23 lbs since being admitted for CABG. He feels a lot better and is motivated to keep making heart healthy choices.    Expected Outcomes Short: attend cardiac rehab for education and exercise. Long: develop and maintian positive self care habits.    Continue Psychosocial Services  Follow up  required by staff             Psychosocial Re-Evaluation:   Psychosocial Discharge (Final Psychosocial Re-Evaluation):   Vocational Rehabilitation: Provide vocational rehab assistance to qualifying candidates.   Vocational Rehab Evaluation & Intervention:  Vocational Rehab - 12/22/20 1006       Initial Vocational Rehab Evaluation & Intervention   Assessment shows need for Vocational Rehabilitation No             Education: Education Goals: Education classes will be provided on a variety of topics geared toward better understanding of heart health and risk factor modification. Participant will state understanding/return demonstration of topics presented as noted by education test  scores.  Learning Barriers/Preferences:  Learning Barriers/Preferences - 12/22/20 1006       Learning Barriers/Preferences   Learning Barriers None             General Cardiac Education Topics:  AED/CPR: - Group verbal and written instruction with the use of models to demonstrate the basic use of the AED with the basic ABC's of resuscitation.   Anatomy and Cardiac Procedures: - Group verbal and visual presentation and models provide information about basic cardiac anatomy and function. Reviews the testing methods done to diagnose heart disease and the outcomes of the test results. Describes the treatment choices: Medical Management, Angioplasty, or Coronary Bypass Surgery for treating various heart conditions including Myocardial Infarction, Angina, Valve Disease, and Cardiac Arrhythmias.  Written material given at graduation. Flowsheet Row Cardiac Rehab from 12/28/2020 in [facility] Cardiac and Pulmonary Rehab  Education need identified 12/28/20       Medication Safety: - Group verbal and visual instruction to review commonly prescribed medications for heart and lung disease. Reviews the medication, class of the drug, and side effects. Includes the steps to properly store meds and maintain the prescription regimen.  Written material given at graduation.   Intimacy: - Group verbal instruction through game format to discuss how heart and lung disease can affect sexual intimacy. Written material given at graduation..   Know Your Numbers and Heart Failure: - Group verbal and visual instruction to discuss disease risk factors for cardiac and pulmonary disease and treatment options.  Reviews associated critical values for Overweight/Obesity, Hypertension, Cholesterol, and Diabetes.  Discusses basics of heart failure: signs/symptoms and treatments.  Introduces Heart Failure Zone chart for action plan for heart failure.  Written material given at graduation.   Infection Prevention: -  Provides verbal and written material to individual with discussion of infection control including proper hand washing and proper equipment cleaning during exercise session. Flowsheet Row Cardiac Rehab from 12/28/2020 in [facility] Cardiac and Pulmonary Rehab  Date 12/28/20  Educator AS  Instruction Review Code 1- Verbalizes Understanding       Falls Prevention: - Provides verbal and written material to individual with discussion of falls prevention and safety. Flowsheet Row Cardiac Rehab from 12/28/2020 in [facility] Cardiac and Pulmonary Rehab  Date 12/28/20  Educator AS  Instruction Review Code 1- Verbalizes Understanding       Other: -Provides group and verbal instruction on various topics (see comments)   Knowledge Questionnaire Score:  Knowledge Questionnaire Score - 12/28/20 1521       Knowledge Questionnaire Score   Pre Score 22/26             Core Components/Risk Factors/Patient Goals at Admission:  Personal Goals and Risk Factors at Admission - 12/28/20 1530       Core Components/Risk Factors/Patient Goals on Admission  Hypertension Yes    Intervention Provide education on lifestyle modifcations including regular physical activity/exercise, weight management, moderate sodium restriction and increased consumption of fresh fruit, vegetables, and low fat dairy, alcohol moderation, and smoking cessation.;Monitor prescription use compliance.    Expected Outcomes Short Term: Continued assessment and intervention until BP is < 140/97mm HG in hypertensive participants. < 130/30mm HG in hypertensive participants with diabetes, heart failure or chronic kidney disease.;Long Term: Maintenance of blood pressure at goal levels.    Lipids Yes    Intervention Provide education and support for participant on nutrition & aerobic/resistive exercise along with prescribed medications to achieve LDL 70mg , HDL >40mg .    Expected Outcomes Short Term: Participant states understanding of desired  cholesterol values and is compliant with medications prescribed. Participant is following exercise prescription and nutrition guidelines.;Long Term: Cholesterol controlled with medications as prescribed, with individualized exercise RX and with personalized nutrition plan. Value goals: LDL < 70mg , HDL > 40 mg.             Education:Diabetes - Individual verbal and written instruction to review signs/symptoms of diabetes, desired ranges of glucose level fasting, after meals and with exercise. Acknowledge that pre and post exercise glucose checks will be done for 3 sessions at entry of program.   Core Components/Risk Factors/Patient Goals Review:    Core Components/Risk Factors/Patient Goals at Discharge (Final Review):    ITP Comments:  ITP Comments     Row Name 12/22/20 1025           ITP Comments Initial telephone orientation completed. Diagnosis can be found in [facility] 8/5. EP orientation scheduled for Thursday 9/29 at 2pm.                Comments: initial ITP

## 2020-12-29 DIAGNOSIS — J449 Chronic obstructive pulmonary disease, unspecified: Secondary | ICD-10-CM | POA: Diagnosis not present

## 2021-01-01 ENCOUNTER — Other Ambulatory Visit: Payer: Self-pay

## 2021-01-01 ENCOUNTER — Encounter: Payer: BC Managed Care – PPO | Attending: Cardiovascular Disease

## 2021-01-01 DIAGNOSIS — Z951 Presence of aortocoronary bypass graft: Secondary | ICD-10-CM | POA: Insufficient documentation

## 2021-01-01 DIAGNOSIS — Z48812 Encounter for surgical aftercare following surgery on the circulatory system: Secondary | ICD-10-CM | POA: Insufficient documentation

## 2021-01-01 NOTE — Progress Notes (Signed)
Daily Session Note  Patient Details  Name: Hayden Deleon MRN: [id number] Date of Birth: 07/27/1955 Referring Provider:   Flowsheet Row Cardiac Rehab from 12/28/2020 in [facility] Cardiac [facility] Rehab  Referring Provider Fletcher Anon       Encounter Date: 01/01/2021  Check In:  Session Check In - 01/01/21 0732       Check-In   Supervising physician immediately available to respond to emergencies See telemetry face sheet for immediately available ER MD    Location [facility]-Cardiac & Pulmonary Rehab    Staff Present Birdie Sons, MPA, Mauricia Area, BS, ACSM CEP, Exercise Physiologist;Joseph Tessie Fass, [place]    Virtual Visit No    Medication changes reported     No    Fall or balance concerns reported    No    Warm-up and Cool-down Performed on first and last piece of equipment    Resistance Training Performed Yes    VAD Patient? No    PAD/SET Patient? No      Pain Assessment   Currently in Pain? No/denies                Social History   Tobacco Use  Smoking Status Former   Packs/day: 2.00   Years: 32.00   Pack years: 64.00   Types: Cigarettes   Quit date: 08/31/1999   Years since quitting: 21.3  Smokeless Tobacco Former    Goals Met:  Independence with exercise equipment Exercise tolerated well No report of concerns or symptoms today Strength training completed today  Goals Unmet:  Not Applicable  Comments: First full day of exercise!  Patient was oriented to gym and equipment including functions, settings, policies, and procedures.  Patient's individual exercise prescription and treatment plan were reviewed.  All starting workloads were established based on the results of the 6 minute walk test done at initial orientation visit.  The plan for exercise progression was also introduced and progression will be customized based on patient's performance and goals.    Dr. Emily Filbert is Medical Director for [place].  Dr. Ottie Glazier  is Medical Director for [facility] Pulmonary Rehabilitation.

## 2021-01-03 ENCOUNTER — Encounter: Payer: Self-pay | Admitting: *Deleted

## 2021-01-03 ENCOUNTER — Other Ambulatory Visit: Payer: Self-pay

## 2021-01-03 DIAGNOSIS — Z951 Presence of aortocoronary bypass graft: Secondary | ICD-10-CM

## 2021-01-03 DIAGNOSIS — Z48812 Encounter for surgical aftercare following surgery on the circulatory system: Secondary | ICD-10-CM | POA: Diagnosis not present

## 2021-01-03 NOTE — Progress Notes (Signed)
Daily Session Note  Patient Details  Name: Hayden Deleon MRN: [id number] Date of Birth: [birth year] Referring Provider:   Flowsheet Row Cardiac Rehab from 12/28/2020 in [facility] Cardiac and Pul[facility] Rehab  Referring Provider Fletcher Anon       Encounter Date: 01/03/2021  Check In:  Session Check In - 01/03/21 0736       Check-In   Supervising physician immediately available to respond to emergencies See telemetry face sheet for immediately available ER MD    Location [facility]-Cardiac & Pulmonary Rehab    Staff Present Birdie Sons, MPA, RN;Amanda Oletta Darter, BA, ACSM CEP, Exercise Physiologist;Melissa Caiola, RDN, LDN    Virtual Visit No    Medication changes reported     No    Fall or balance concerns reported    No    Warm-up and Cool-down Performed on first and last piece of equipment    Resistance Training Performed Yes    VAD Patient? No    PAD/SET Patient? No      Pain Assessment   Currently in Pain? No/denies                Social History   Tobacco Use  Smoking Status Former   Packs/day: 2.00   Years: 32.00   Pack years: 64.00   Types: Cigarettes   Quit date: 08/31/1999   Years since quitting: 21.3  Smokeless Tobacco Former    Goals Met:  Independence with exercise equipment Exercise tolerated well No report of concerns or symptoms today Strength training completed today  Goals Unmet:  Not Applicable  Comments: Pt able to follow exercise prescription today without complaint.  Will continue to monitor for progression.    Dr. Emily Filbert is Medical Director for [place].  Dr. Ottie Glazier is Medical Director for [facility] Pulmonary Rehabilitation.

## 2021-01-03 NOTE — Progress Notes (Signed)
Cardiac Individual Treatment Plan  Patient Details  Name: Hayden Deleon MRN: [id number] Date of Birth: [birth year] Referring Provider:   Flowsheet Row Cardiac Rehab from 12/28/2020 in [facility] Cardiac and Pulmonary Rehab  Referring Provider Arida       Initial Encounter Date:  Flowsheet Row Cardiac Rehab from 12/28/2020 in [facility] Cardiac and Pulmonary Rehab  Date 12/28/20       Visit Diagnosis: S/P CABG x 3  Patient's Home Medications on Admission:  Current Outpatient Medications:    albuterol (VENTOLIN HFA) 108 (90 Base) MCG/ACT inhaler, TAKE 2 PUFFS BY MOUTH EVERY 6 HOURS AS NEEDED FOR WHEEZE OR SHORTNESS OF BREATH, Disp: 6.7 each, Rfl: 10   aspirin EC 325 MG EC tablet, Take 1 tablet (325 mg total) by mouth daily., Disp: 30 tablet, Rfl: 0   atorvastatin (LIPITOR) 80 MG tablet, Take 1 tablet (80 mg total) by mouth daily., Disp: 30 tablet, Rfl: 2   Cyanocobalamin (B-12) 3000 MCG SUBL, Place 3,000 mcg under the tongue daily., Disp: , Rfl:    Fluticasone-Umeclidin-Vilant (TRELEGY ELLIPTA) 100-62.5-25 MCG/INH AEPB, Inhale 100 mcg into the lungs daily., Disp: 28 each, Rfl: 0   guaiFENesin (MUCINEX) 600 MG 12 hr tablet, Take 1 tablet (600 mg total) by mouth 2 (two) times daily., Disp: 30 tablet, Rfl: PRN   Multiple Vitamin (MULTIVITAMIN) capsule, Take 1 capsule by mouth daily., Disp: , Rfl:    nitroGLYCERIN (NITROSTAT) 0.4 MG SL tablet, Place 0.4 mg under the tongue every 5 (five) minutes as needed for chest pain., Disp: , Rfl:    sertraline (ZOLOFT) 50 MG tablet, TAKE 1 TABLET BY MOUTH EVERY DAY, Disp: 90 tablet, Rfl: 1   TRELEGY ELLIPTA 100-62.5-25 MCG/INH AEPB, TAKE 1 PUFF BY MOUTH EVERY DAY, Disp: 180 each, Rfl: 2  Past Medical History: Past Medical History:  Diagnosis Date   Allergy    Anxiety    Arthritis    right shoulder   CAD (coronary artery disease)    a. 2003 Cath: OM1 100%, good collats, RCA 60-70p-->med rx; b. 04/2020 MV: mild ischemia to sm area of mid inflat & apical lat  segments. EF >65%; d. 10/2020 Cath: LM 85ost, LAD 30p/m, OM1 100, RCA 99p/m; e. 10/2020 CABG x 3: LIMA->LAD, VG->RPDA, VG->OM2.   Carotid artery disease ([place])    a. 03/2020 Carotid U/S: RICA 8-11%, LICA 57-26%.   COPD (chronic obstructive pulmonary disease) ([place])    Significant Dr. Lamonte Sakai   Diastolic dysfunction    a. 03/2018 Echo: EF 60-65%, no rwma, Gr1 DD. Mildly dil LA. Nl RV fxn.   Dilation of ascending aorta and aortic root ([place])    a. 10/2020 Echo: Asc Ao 11mm.   Diverticulosis of colon    GERD (gastroesophageal reflux disease)    Gout    Hyperlipidemia    Hypertension    LVH (left ventricular hypertrophy)    a. 10/2020 Echo: EF 70-75%, no rwma, mod LVH. Nl RV size/fxn. Mildly dil RA. Triv MR. Asc Ao 50mm.   Prostatitis    Sleep apnea    uses cpap nightly    Tobacco Use: Social History   Tobacco Use  Smoking Status Former   Packs/day: 2.00   Years: 32.00   Pack years: 64.00   Types: Cigarettes   Quit date: 08/31/1999   Years since quitting: 21.3  Smokeless Tobacco Former    Labs: Recent Government social research officer for [employer] Cardiac and Pulmonary Rehab Latest Ref Rng & Units 11/05/2020 11/05/2020  11/06/2020 11/06/2020 11/07/2020   Cholestrol 0 - 200 mg/dL - - - - 85   LDLCALC 0 - 99 mg/dL - - - - 34   LDLDIRECT mg/dL - - - - -   HDL >40 mg/dL - - - - 21(L)   Trlycerides <150 mg/dL - - - - 150(H)   Hemoglobin A1c 4.8 - 5.6 % - - - - 5.6   PHART 7.350 - 7.450 7.194(LL) 7.234(L) 7.389 7.362 -   PCO2ART 32.0 - 48.0 mmHg 56.3(H) 50.1(H) 32.8 35.2 -   HCO3 20.0 - 28.0 mmol/L 21.8 21.0 19.8(L) 19.9(L) -   TCO2 22 - 32 mmol/L 23 22 21(L) 21(L) -   ACIDBASEDEF 0.0 - 2.0 mmol/L 6.0(H) 6.0(H) 5.0(H) 5.0(H) -   O2SAT % 92.0 87.0 95.0 89.0 -        Exercise Target Goals: Exercise Program Goal: Individual exercise prescription set using results from initial 6 min walk test and THRR while considering  patient's activity barriers and safety.   Exercise Prescription Goal: Initial  exercise prescription builds to 30-45 minutes a day of aerobic activity, 2-3 days per week.  Home exercise guidelines will be given to patient during program as part of exercise prescription that the participant will acknowledge.   Education: Aerobic Exercise: - Group verbal and visual presentation on the components of exercise prescription. Introduces F.I.T.T principle from ACSM for exercise prescriptions.  Reviews F.I.T.T. principles of aerobic exercise including progression. Written material given at graduation. Flowsheet Row Cardiac Rehab from 01/03/2021 in [facility] Cardiac and Pulmonary Rehab  Education need identified 12/28/20       Education: Resistance Exercise: - Group verbal and visual presentation on the components of exercise prescription. Introduces F.I.T.T principle from ACSM for exercise prescriptions  Reviews F.I.T.T. principles of resistance exercise including progression. Written material given at graduation.    Education: Exercise & Equipment Safety: - Individual verbal instruction and demonstration of equipment use and safety with use of the equipment. Flowsheet Row Cardiac Rehab from 01/03/2021 in [facility] Cardiac and Pulmonary Rehab  Date 12/28/20  Educator AS  Instruction Review Code 1- Verbalizes Understanding       Education: Exercise Physiology & General Exercise Guidelines: - Group verbal and written instruction with models to review the exercise physiology of the cardiovascular system and associated critical values. Provides general exercise guidelines with specific guidelines to those with heart or lung disease.    Education: Flexibility, Balance, Mind/Body Relaxation: - Group verbal and visual presentation with interactive activity on the components of exercise prescription. Introduces F.I.T.T principle from ACSM for exercise prescriptions. Reviews F.I.T.T. principles of flexibility and balance exercise training including progression. Also discusses the mind body  connection.  Reviews various relaxation techniques to help reduce and manage stress (i.e. Deep breathing, progressive muscle relaxation, and visualization). Balance handout provided to take home. Written material given at graduation.   Activity Barriers & Risk Stratification:  Activity Barriers & Cardiac Risk Stratification - 12/22/20 1004       Activity Barriers & Cardiac Risk Stratification   Activity Barriers Other (comment)    Comments COPD; history of shoulder replacement (doesn't cause issues)    Cardiac Risk Stratification High             6 Minute Walk:  6 Minute Walk     Row Name 12/28/20 1525         6 Minute Walk   Phase Initial     Distance 1115 feet     Walk Time 6  minutes     # of Rest Breaks 0     MPH 2.1     METS 3     RPE 11     Perceived Dyspnea  0     VO2 Peak 10.5     Symptoms No     Resting HR 81 bpm     Resting BP 138/66     Resting Oxygen Saturation  95 %     Exercise Oxygen Saturation  during 6 min walk 91 %     Max Ex. HR 97 bpm     Max Ex. BP 160/72     2 Minute Post BP 130/66              Oxygen Initial Assessment:   Oxygen Re-Evaluation:   Oxygen Discharge (Final Oxygen Re-Evaluation):   Initial Exercise Prescription:  Initial Exercise Prescription - 12/28/20 1500       Date of Initial Exercise RX and Referring Provider   Date 12/28/20    Referring Provider Arida      Treadmill   MPH 2    Grade 1.5    Minutes 15    METs 3      NuStep   Level 3    SPM 80    Minutes 15    METs 3      Elliptical   Level 1    Speed 2.5    Minutes 15    METs 3      REL-XR   Level 3    Speed 50    Minutes 15    METs 3      T5 Nustep   Level 2    SPM 80    Minutes 15    METs 3      Prescription Details   Frequency (times per week) 3    Duration Progress to 30 minutes of continuous aerobic without signs/symptoms of physical distress      Intensity   THRR 40-80% of Max Heartrate 110-140    Ratings of Perceived  Exertion 11-15    Perceived Dyspnea 0-4      Resistance Training   Training Prescription Yes    Weight 3 lb    Reps 10-15             Perform Capillary Blood Glucose checks as needed.  Exercise Prescription Changes:   Exercise Prescription Changes     Row Name 12/28/20 1500 01/01/21 1500           Response to Exercise   Blood Pressure (Admit) 138/66 130/76      Blood Pressure (Exercise) 160/72 142/58      Blood Pressure (Exit) 130/66 124/76      Heart Rate (Admit) 81 bpm 84 bpm      Heart Rate (Exercise) 97 bpm 110 bpm      Heart Rate (Exit) 87 bpm 96 bpm      Oxygen Saturation (Admit) 95 % --      Oxygen Saturation (Exercise) 91 % --      Rating of Perceived Exertion (Exercise) 11 11      Perceived Dyspnea (Exercise) 0 --      Symptoms none none      Comments -- first full day of exercise      Duration -- Progress to 30 minutes of  aerobic without signs/symptoms of physical distress      Intensity -- THRR unchanged  Progression   Progression -- Continue to progress workloads to maintain intensity without signs/symptoms of physical distress.      Average METs -- 2.9             Resistance Training   Training Prescription -- Yes      Weight -- 3 lb      Reps -- 10-15             Interval Training   Interval Training -- No             Treadmill   MPH -- 2      Grade -- 1.5      Minutes -- 15      METs -- 2.94             NuStep   Level -- 3      Minutes -- 15      METs -- 3             Oxygen   Maintain Oxygen Saturation -- 88% or higher               Exercise Comments:   Exercise Comments     Row Name 01/01/21 0733           Exercise Comments First full day of exercise!  Patient was oriented to gym and equipment including functions, settings, policies, and procedures.  Patient's individual exercise prescription and treatment plan were reviewed.  All starting workloads were established based on the results of the 6  minute walk test done at initial orientation visit.  The plan for exercise progression was also introduced and progression will be customized based on patient's performance and goals.                Exercise Goals and Review:   Exercise Goals     Row Name 12/28/20 1528             Exercise Goals   Increase Physical Activity Yes       Intervention Provide advice, education, support and counseling about physical activity/exercise needs.;Develop an individualized exercise prescription for aerobic and resistive training based on initial evaluation findings, risk stratification, comorbidities and participant's personal goals.       Expected Outcomes Short Term: Attend rehab on a regular basis to increase amount of physical activity.;Long Term: Add in home exercise to make exercise part of routine and to increase amount of physical activity.;Long Term: Exercising regularly at least 3-5 days a week.       Increase Strength and Stamina Yes       Intervention Provide advice, education, support and counseling about physical activity/exercise needs.;Develop an individualized exercise prescription for aerobic and resistive training based on initial evaluation findings, risk stratification, comorbidities and participant's personal goals.       Expected Outcomes Short Term: Increase workloads from initial exercise prescription for resistance, speed, and METs.;Short Term: Perform resistance training exercises routinely during rehab and add in resistance training at home;Long Term: Improve cardiorespiratory fitness, muscular endurance and strength as measured by increased METs and functional capacity (6MWT)       Able to understand and use rate of perceived exertion (RPE) scale Yes       Intervention Provide education and explanation on how to use RPE scale       Expected Outcomes Short Term: Able to use RPE daily in rehab to express subjective intensity level;Long Term:  Able to use RPE to guide  intensity level  when exercising independently       Able to understand and use Dyspnea scale Yes       Intervention Provide education and explanation on how to use Dyspnea scale       Expected Outcomes Short Term: Able to use Dyspnea scale daily in rehab to express subjective sense of shortness of breath during exertion;Long Term: Able to use Dyspnea scale to guide intensity level when exercising independently       Knowledge and understanding of Target Heart Rate Range (THRR) Yes       Intervention Provide education and explanation of THRR including how the numbers were predicted and where they are located for reference       Expected Outcomes Short Term: Able to state/look up THRR;Short Term: Able to use daily as guideline for intensity in rehab;Long Term: Able to use THRR to govern intensity when exercising independently       Able to check pulse independently Yes       Intervention Provide education and demonstration on how to check pulse in carotid and radial arteries.;Review the importance of being able to check your own pulse for safety during independent exercise       Expected Outcomes Short Term: Able to explain why pulse checking is important during independent exercise;Long Term: Able to check pulse independently and accurately       Understanding of Exercise Prescription Yes       Intervention Provide education, explanation, and written materials on patient's individual exercise prescription       Expected Outcomes Short Term: Able to explain program exercise prescription;Long Term: Able to explain home exercise prescription to exercise independently                Exercise Goals Re-Evaluation :  Exercise Goals Re-Evaluation     Row Name 01/01/21 0733             Exercise Goal Re-Evaluation   Exercise Goals Review Increase Physical Activity;Able to understand and use rate of perceived exertion (RPE) scale;Understanding of Exercise Prescription;Knowledge and understanding of  Target Heart Rate Range (THRR);Increase Strength and Stamina;Able to understand and use Dyspnea scale;Able to check pulse independently       Comments Reviewed RPE and dyspnea scales, THR and program prescription with pt today.  Pt voiced understanding and was given a copy of goals to take home.       Expected Outcomes Short: Use RPE daily to regulate intensity. Long: Follow program prescription in THR.                Discharge Exercise Prescription (Final Exercise Prescription Changes):  Exercise Prescription Changes - 01/01/21 1500       Response to Exercise   Blood Pressure (Admit) 130/76    Blood Pressure (Exercise) 142/58    Blood Pressure (Exit) 124/76    Heart Rate (Admit) 84 bpm    Heart Rate (Exercise) 110 bpm    Heart Rate (Exit) 96 bpm    Rating of Perceived Exertion (Exercise) 11    Symptoms none    Comments first full day of exercise    Duration Progress to 30 minutes of  aerobic without signs/symptoms of physical distress    Intensity THRR unchanged      Progression   Progression Continue to progress workloads to maintain intensity without signs/symptoms of physical distress.    Average METs 2.9      Resistance Training   Training Prescription Yes    Weight 3  lb    Reps 10-15      Interval Training   Interval Training No      Treadmill   MPH 2    Grade 1.5    Minutes 15    METs 2.94      NuStep   Level 3    Minutes 15    METs 3      Oxygen   Maintain Oxygen Saturation 88% or higher             Nutrition:  Target Goals: Understanding of nutrition guidelines, daily intake of sodium 1500mg , cholesterol 200mg , calories 30% from fat and 7% or less from saturated fats, daily to have 5 or more servings of fruits and vegetables.  Education: All About Nutrition: -Group instruction provided by verbal, written material, interactive activities, discussions, models, and posters to present general guidelines for heart healthy nutrition including fat,  fiber, MyPlate, the role of sodium in heart healthy nutrition, utilization of the nutrition label, and utilization of this knowledge for meal planning. Follow up email sent as well. Written material given at graduation. Flowsheet Row Cardiac Rehab from 01/03/2021 in [facility] Cardiac and Pulmonary Rehab  Education need identified 12/28/20       Biometrics:  Pre Biometrics - 12/28/20 1530       Pre Biometrics   Height 5\' 11"  (1.803 m)    Weight 196 lb 4.8 oz (89 kg)    BMI (Calculated) 27.39    Single Leg Stand 30 seconds              Nutrition Therapy Plan and Nutrition Goals:   Nutrition Assessments:  MEDIFICTS Score Key: ?70 Need to make dietary changes  40-70 Heart Healthy Diet ? 40 Therapeutic Level Cholesterol Diet  Flowsheet Row Cardiac Rehab from 12/28/2020 in [facility] Cardiac and Pulmonary Rehab  Picture Your Plate Total Score on Admission 74      Picture Your Plate Scores: <56 Unhealthy dietary pattern with much room for improvement. 41-50 Dietary pattern unlikely to meet recommendations for good health and room for improvement. 51-60 More healthful dietary pattern, with some room for improvement.  >60 Healthy dietary pattern, although there may be some specific behaviors that could be improved.    Nutrition Goals Re-Evaluation:   Nutrition Goals Discharge (Final Nutrition Goals Re-Evaluation):   Psychosocial: Target Goals: Acknowledge presence or absence of significant depression and/or stress, maximize coping skills, provide positive support system. Participant is able to verbalize types and ability to use techniques and skills needed for reducing stress and depression.   Education: Stress, Anxiety, and Depression - Group verbal and visual presentation to define topics covered.  Reviews how body is impacted by stress, anxiety, and depression.  Also discusses healthy ways to reduce stress and to treat/manage anxiety and depression.  Written material given at  graduation.   Education: Sleep Hygiene -Provides group verbal and written instruction about how sleep can affect your health.  Define sleep hygiene, discuss sleep cycles and impact of sleep habits. Review good sleep hygiene tips.    Initial Review & Psychosocial Screening:  Initial Psych Review & Screening - 12/22/20 1007       Initial Review   Current issues with None Identified      Family Dynamics   Good Support System? Yes   wife, mother in law, friends, coworkers     Barriers   Psychosocial barriers to participate in program There are no identifiable barriers or psychosocial needs.  Screening Interventions   Interventions Encouraged to exercise;To provide support and resources with identified psychosocial needs;Provide feedback about the scores to participant    Expected Outcomes Short Term goal: Utilizing psychosocial counselor, staff and physician to assist with identification of specific Stressors or current issues interfering with healing process. Setting desired goal for each stressor or current issue identified.;Long Term Goal: Stressors or current issues are controlled or eliminated.;Long Term goal: The participant improves quality of Life and PHQ9 Scores as seen by post scores and/or verbalization of changes;Short Term goal: Identification and review with participant of any Quality of Life or Depression concerns found by scoring the questionnaire.             Quality of Life Scores:   Quality of Life - 12/28/20 1521       Quality of Life   Select Quality of Life      Quality of Life Scores   Health/Function Pre 27.86 %    Socioeconomic Pre 25.5 %    Psych/Spiritual Pre 30 %    Family Pre 25.8 %    GLOBAL Pre 27.86 %            Scores of 19 and below usually indicate a poorer quality of life in these areas.  A difference of  2-3 points is a clinically meaningful difference.  A difference of 2-3 points in the total score of the Quality of Life Index has  been associated with significant improvement in overall quality of life, self-image, physical symptoms, and general health in studies assessing change in quality of life.  PHQ-9: Recent Review Flowsheet Data     Depression screen [facility] 2/9 12/28/2020 12/21/2018 04/02/2017   Decreased Interest 0 0 0   Down, Depressed, Hopeless 0 0 0   PHQ - 2 Score 0 0 0   Altered sleeping 0 - -   Tired, decreased energy 1 - -   Change in appetite 0 - -   Feeling bad or failure about yourself  0 - -   Trouble concentrating 0 - -   Moving slowly or fidgety/restless 0 - -   Suicidal thoughts 0 - -   PHQ-9 Score 1 - -   Difficult doing work/chores Not difficult at all - -      Interpretation of Total Score  Total Score Depression Severity:  1-4 = Minimal depression, 5-9 = Mild depression, 10-14 = Moderate depression, 15-19 = Moderately severe depression, 20-27 = Severe depression   Psychosocial Evaluation and Intervention:  Psychosocial Evaluation - 12/22/20 1027       Psychosocial Evaluation & Interventions   Interventions Encouraged to exercise with the program and follow exercise prescription    Comments Ray reports feeling quite well post CABG. He has noted his COPD symptoms feel better as well. He has been walking a few miles a week and is back to work part time. 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 His wife, mother in law, friends and coworkers have been a great support system during his recovery. He and his wife have been making a lot of diet changes since surgery and he has lost 23 lbs since being admitted for CABG. He feels a lot better and is motivated to keep making heart healthy choices.    Expected Outcomes Short: attend cardiac rehab for education and exercise. Long: develop and maintian positive self care habits.  Continue Psychosocial Services  Follow up required by staff             Psychosocial  Re-Evaluation:   Psychosocial Discharge (Final Psychosocial Re-Evaluation):   Vocational Rehabilitation: Provide vocational rehab assistance to qualifying candidates.   Vocational Rehab Evaluation & Intervention:  Vocational Rehab - 12/22/20 1006       Initial Vocational Rehab Evaluation & Intervention   Assessment shows need for Vocational Rehabilitation No             Education: Education Goals: Education classes will be provided on a variety of topics geared toward better understanding of heart health and risk factor modification. Participant will state understanding/return demonstration of topics presented as noted by education test scores.  Learning Barriers/Preferences:  Learning Barriers/Preferences - 12/22/20 1006       Learning Barriers/Preferences   Learning Barriers None             General Cardiac Education Topics:  AED/CPR: - Group verbal and written instruction with the use of models to demonstrate the basic use of the AED with the basic ABC's of resuscitation.   Anatomy and Cardiac Procedures: - Group verbal and visual presentation and models provide information about basic cardiac anatomy and function. Reviews the testing methods done to diagnose heart disease and the outcomes of the test results. Describes the treatment choices: Medical Management, Angioplasty, or Coronary Bypass Surgery for treating various heart conditions including Myocardial Infarction, Angina, Valve Disease, and Cardiac Arrhythmias.  Written material given at graduation. Flowsheet Row Cardiac Rehab from 01/03/2021 in [facility] Cardiac and Pulmonary Rehab  Education need identified 12/28/20       Medication Safety: - Group verbal and visual instruction to review commonly prescribed medications for heart and lung disease. Reviews the medication, class of the drug, and side effects. Includes the steps to properly store meds and maintain the prescription regimen.  Written material  given at graduation.   Intimacy: - Group verbal instruction through game format to discuss how heart and lung disease can affect sexual intimacy. Written material given at graduation..   Know Your Numbers and Heart Failure: - Group verbal and visual instruction to discuss disease risk factors for cardiac and pulmonary disease and treatment options.  Reviews associated critical values for Overweight/Obesity, Hypertension, Cholesterol, and Diabetes.  Discusses basics of heart failure: signs/symptoms and treatments.  Introduces Heart Failure Zone chart for action plan for heart failure.  Written material given at graduation.   Infection Prevention: - Provides verbal and written material to individual with discussion of infection control including proper hand washing and proper equipment cleaning during exercise session. Flowsheet Row Cardiac Rehab from 01/03/2021 in [facility] Cardiac and Pulmonary Rehab  Date 12/28/20  Educator AS  Instruction Review Code 1- Verbalizes Understanding       Falls Prevention: - Provides verbal and written material to individual with discussion of falls prevention and safety. Flowsheet Row Cardiac Rehab from 01/03/2021 in [facility] Cardiac and Pulmonary Rehab  Date 12/28/20  Educator AS  Instruction Review Code 1- Verbalizes Understanding       Other: -Provides group and verbal instruction on various topics (see comments)   Knowledge Questionnaire Score:  Knowledge Questionnaire Score - 12/28/20 1521       Knowledge Questionnaire Score   Pre Score 22/26             Core Components/Risk Factors/Patient Goals at Admission:  Personal Goals and Risk Factors at Admission - 12/28/20 1530  Core Components/Risk Factors/Patient Goals on Admission   Hypertension Yes    Intervention Provide education on lifestyle modifcations including regular physical activity/exercise, weight management, moderate sodium restriction and increased consumption of fresh  fruit, vegetables, and low fat dairy, alcohol moderation, and smoking cessation.;Monitor prescription use compliance.    Expected Outcomes Short Term: Continued assessment and intervention until BP is < 140/15mm HG in hypertensive participants. < 130/74mm HG in hypertensive participants with diabetes, heart failure or chronic kidney disease.;Long Term: Maintenance of blood pressure at goal levels.    Lipids Yes    Intervention Provide education and support for participant on nutrition & aerobic/resistive exercise along with prescribed medications to achieve LDL 70mg , HDL >40mg .    Expected Outcomes Short Term: Participant states understanding of desired cholesterol values and is compliant with medications prescribed. Participant is following exercise prescription and nutrition guidelines.;Long Term: Cholesterol controlled with medications as prescribed, with individualized exercise RX and with personalized nutrition plan. Value goals: LDL < 70mg , HDL > 40 mg.             Education:Diabetes - Individual verbal and written instruction to review signs/symptoms of diabetes, desired ranges of glucose level fasting, after meals and with exercise. Acknowledge that pre and post exercise glucose checks will be done for 3 sessions at entry of program.   Core Components/Risk Factors/Patient Goals Review:    Core Components/Risk Factors/Patient Goals at Discharge (Final Review):    ITP Comments:  ITP Comments     Row Name 12/22/20 1025 12/28/20 1537 01/01/21 0733 01/03/21 0821     ITP Comments Initial telephone orientation completed. Diagnosis can be found in [facility] 8/5. EP orientation scheduled for Thursday 9/29 at 2pm. Completed 6MWT and gym orientation. Initial ITP created and sent for review to Dr. Emily Filbert, Medical Director. First full day of exercise!  Patient was oriented to gym and equipment including functions, settings, policies, and procedures.  Patient's individual exercise prescription  and treatment plan were reviewed.  All starting workloads were established based on the results of the 6 minute walk test done at initial orientation visit.  The plan for exercise progression was also introduced and progression will be customized based on patient's performance and goals. 30 day review completed. ITP sent to Dr. Emily Filbert, Medical Director of Cardiac Rehab. Continue with ITP unless changes are made by physician.             Comments: 30 day review

## 2021-01-05 ENCOUNTER — Other Ambulatory Visit: Payer: Self-pay

## 2021-01-05 ENCOUNTER — Encounter: Payer: BC Managed Care – PPO | Admitting: *Deleted

## 2021-01-05 DIAGNOSIS — Z951 Presence of aortocoronary bypass graft: Secondary | ICD-10-CM

## 2021-01-05 DIAGNOSIS — Z48812 Encounter for surgical aftercare following surgery on the circulatory system: Secondary | ICD-10-CM | POA: Diagnosis not present

## 2021-01-05 NOTE — Progress Notes (Signed)
Daily Session Note  Patient Details  Name: Roshad Hack MRN: [id number] Date of Birth: [birth year] Referring Provider:   Flowsheet Row Cardiac Rehab from 12/28/2020 in [facility] Cardiac and Pulmonary Rehab  Referring Provider Fletcher Anon       Encounter Date: 01/05/2021  Check In:  Session Check In - 01/05/21 0723       Check-In   Supervising physician immediately available to respond to emergencies See telemetry face sheet for immediately available ER MD    Location [facility]-Cardiac & Pulmonary Rehab    Staff Present Renita Papa, RN BSN;Joseph Gaston, RCP,RRT,BSRT;Jessica [place], [place], RCEP, CCRP, CCET    Virtual Visit No    Medication changes reported     No    Fall or balance concerns reported    No    Warm-up and Cool-down Performed on first and last piece of equipment    Resistance Training Performed Yes    VAD Patient? No    PAD/SET Patient? No      Pain Assessment   Currently in Pain? No/denies                Social History   Tobacco Use  Smoking Status Former   Packs/day: 2.00   Years: 32.00   Pack years: 64.00   Types: Cigarettes   Quit date: 08/31/1999   Years since quitting: 21.3  Smokeless Tobacco Former    Goals Met:  Independence with exercise equipment Exercise tolerated well No report of concerns or symptoms today Strength training completed today  Goals Unmet:  Not Applicable  Comments: Pt able to follow exercise prescription today without complaint.  Will continue to monitor for progression.    Dr. Emily Filbert is Medical Director for [place].  Dr. Ottie Glazier is Medical Director for [facility] Pulmonary Rehabilitation.

## 2021-01-08 ENCOUNTER — Other Ambulatory Visit: Payer: Self-pay

## 2021-01-08 DIAGNOSIS — Z951 Presence of aortocoronary bypass graft: Secondary | ICD-10-CM

## 2021-01-08 DIAGNOSIS — Z48812 Encounter for surgical aftercare following surgery on the circulatory system: Secondary | ICD-10-CM | POA: Diagnosis not present

## 2021-01-08 NOTE — Progress Notes (Signed)
Completed initial RD consultation ?

## 2021-01-08 NOTE — Progress Notes (Signed)
Daily Session Note  Patient Details  Name: Hayden Deleon MRN: [id number] Date of Birth: 04-11-1955 Referring Provider:   Flowsheet Row Cardiac Rehab from 12/28/2020 in [facility] Cardiac and Pulmonary Rehab  Referring Provider Fletcher Anon       Encounter Date: 01/08/2021  Check In:  Session Check In - 01/08/21 0728       Check-In   Supervising physician immediately available to respond to emergencies See telemetry face sheet for immediately available ER MD    Location [facility]-Cardiac & Pulmonary Rehab    Staff Present Will Bonnet, RN,BC,MSN;Joseph York, RCP,RRT,BSRT;Kelly Melbourne, BS, ACSM CEP, Exercise Physiologist;Jessica [place], [place], RCEP, CCRP, CCET    Virtual Visit No    Medication changes reported     No    Fall or balance concerns reported    No    Warm-up and Cool-down Performed on first and last piece of equipment    Resistance Training Performed Yes    VAD Patient? No    PAD/SET Patient? No      Pain Assessment   Currently in Pain? No/denies    Multiple Pain Sites No                Social History   Tobacco Use  Smoking Status Former   Packs/day: 2.00   Years: 32.00   Pack years: 64.00   Types: Cigarettes   Quit date: 08/31/1999   Years since quitting: 21.3  Smokeless Tobacco Former    Goals Met:  Independence with exercise equipment Exercise tolerated well No report of concerns or symptoms today Strength training completed today  Goals Unmet:  Not Applicable  Comments: Pt able to follow exercise prescription today without complaint.  Will continue to monitor for progression.    Dr. Emily Filbert is Medical Director for [place].  Dr. Ottie Glazier is Medical Director for [facility] Pulmonary Rehabilitation.

## 2021-01-10 ENCOUNTER — Other Ambulatory Visit: Payer: Self-pay

## 2021-01-10 DIAGNOSIS — Z951 Presence of aortocoronary bypass graft: Secondary | ICD-10-CM

## 2021-01-10 DIAGNOSIS — Z48812 Encounter for surgical aftercare following surgery on the circulatory system: Secondary | ICD-10-CM | POA: Diagnosis not present

## 2021-01-10 DIAGNOSIS — J449 Chronic obstructive pulmonary disease, unspecified: Secondary | ICD-10-CM | POA: Diagnosis not present

## 2021-01-10 NOTE — Progress Notes (Signed)
Daily Session Note  Patient Details  Name: Hayden Deleon MRN: [id number] Date of Birth: 03-05-1956 Referring Provider:   Flowsheet Row Cardiac Rehab from 12/28/2020 in [facility] Cardiac and Pulmonary Rehab  Referring Provider Fletcher Anon       Encounter Date: 01/10/2021  Check In:  Session Check In - 01/10/21 [place]       Check-In   Supervising physician immediately available to respond to emergencies See telemetry face sheet for immediately available ER MD    Location [facility]-Cardiac & Pulmonary Rehab    Staff Present Birdie Sons, MPA, Elveria Rising, BA, ACSM CEP, Exercise Physiologist;Joseph Tessie Fass, [place]    Virtual Visit No    Medication changes reported     No    Fall or balance concerns reported    No    Warm-up and Cool-down Performed on first and last piece of equipment    Resistance Training Performed Yes    VAD Patient? No    PAD/SET Patient? No      Pain Assessment   Currently in Pain? No/denies                Social History   Tobacco Use  Smoking Status Former   Packs/day: 2.00   Years: 32.00   Pack years: 64.00   Types: Cigarettes   Quit date: 08/31/1999   Years since quitting: 21.3  Smokeless Tobacco Former    Goals Met:  Independence with exercise equipment Exercise tolerated well No report of concerns or symptoms today Strength training completed today  Goals Unmet:  Not Applicable  Comments: Pt able to follow exercise prescription today without complaint.  Will continue to monitor for progression.    Dr. Emily Filbert is Medical Director for [place].  Dr. Ottie Glazier is Medical Director for [facility] Pulmonary Rehabilitation.

## 2021-01-12 ENCOUNTER — Other Ambulatory Visit: Payer: Self-pay

## 2021-01-12 ENCOUNTER — Encounter: Payer: Self-pay | Admitting: Family Medicine

## 2021-01-12 ENCOUNTER — Telehealth (INDEPENDENT_AMBULATORY_CARE_PROVIDER_SITE_OTHER): Payer: BC Managed Care – PPO | Admitting: Family Medicine

## 2021-01-12 VITALS — BP 122/66 | Ht 71.0 in | Wt 193.0 lb

## 2021-01-12 DIAGNOSIS — J441 Chronic obstructive pulmonary disease with (acute) exacerbation: Secondary | ICD-10-CM | POA: Diagnosis not present

## 2021-01-12 DIAGNOSIS — Z951 Presence of aortocoronary bypass graft: Secondary | ICD-10-CM

## 2021-01-12 DIAGNOSIS — Z48812 Encounter for surgical aftercare following surgery on the circulatory system: Secondary | ICD-10-CM | POA: Diagnosis not present

## 2021-01-12 MED ORDER — AZITHROMYCIN 250 MG PO TABS: ORAL_TABLET | ORAL | 0 refills | Status: DC

## 2021-01-12 MED ORDER — PREDNISONE 20 MG PO TABS: ORAL_TABLET | ORAL | 0 refills | Status: DC

## 2021-01-12 NOTE — Assessment & Plan Note (Signed)
Likely COPD exacerbation due to allergic,  viral or bacterial trigger.. cover for bacterial trigger given change in mucus.  COVID less likely given neg home test, but consider retesting 4-5 days into illness if not improving.   Treat with antibiotics, steroid taper, albuterol inhaler prn and continue daily controlled Trelegy Ellipta.  Return sn ER precautions given.

## 2021-01-12 NOTE — Progress Notes (Signed)
Daily Session Note  Patient Details  Name: Hayden Deleon MRN: [id number] Date of Birth: [birth year] Referring Provider:   Flowsheet Row Cardiac Rehab from 12/28/2020 in [facility] Cardiac and Pulmonary Rehab  Referring Provider Fletcher Anon       Encounter Date: 01/12/2021  Check In:  Session Check In - 01/12/21 0720       Check-In   Supervising physician immediately available to respond to emergencies See telemetry face sheet for immediately available ER MD    Location [facility]-Cardiac & Pulmonary Rehab    Staff Present Birdie Sons, MPA, RN;Joseph Clayton, RCP,RRT,BSRT;Jessica [place], [place], RCEP, CCRP, CCET    Virtual Visit No    Medication changes reported     No    Fall or balance concerns reported    No    Warm-up and Cool-down Performed on first and last piece of equipment    Resistance Training Performed Yes    VAD Patient? No    PAD/SET Patient? No      Pain Assessment   Currently in Pain? No/denies                Social History   Tobacco Use  Smoking Status Former   Packs/day: 2.00   Years: 32.00   Pack years: 64.00   Types: Cigarettes   Quit date: 08/31/1999   Years since quitting: 21.3  Smokeless Tobacco Former    Goals Met:  Independence with exercise equipment Exercise tolerated well No report of concerns or symptoms today Strength training completed today  Goals Unmet:  Not Applicable  Comments: Pt able to follow exercise prescription today without complaint.  Will continue to monitor for progression.    Dr. Emily Filbert is Medical Director for [place].  Dr. Ottie Glazier is Medical Director for [facility] Pulmonary Rehabilitation.

## 2021-01-12 NOTE — Progress Notes (Signed)
VIRTUAL VISIT Due to national recommendations of social distancing due to [place] 19, a virtual visit is felt to be most appropriate for this patient at this time.   I connected with the patient on 01/12/21 at  3:20 PM EDT by virtual telehealth platform and verified that I am speaking with the correct person using two identifiers.   I discussed the limitations, risks, security and privacy concerns of performing an evaluation and management service by  virtual telehealth platform and the availability of in person appointments. I also discussed with the patient that there may be a patient responsible charge related to this service. The patient expressed understanding and agreed to proceed.  Patient location: Home Provider Location: [place] [facility] Participants: Eliezer Lofts and [name]   Chief Complaint  Patient presents with   URI    Started last night with chest congestion.    History of Present Illness:  65 year old male pt of Dr. Ethelene Hal with history of CAD, HTN, OSA, and COPD presents with new onset cough.   Onset of symptoms:  01/11/21  Started with  heavy chest feeling, productive cough.. change in mucus.  Burning in chest when coughing.  No fever  Mild runny nose  No ST, no ear pain, no face pain.   Mild increase SOB,  no wheeze   Has not taken any OTC meds yet.   Recovering from CABG x3   in 10/2020  Using Trelegy Ellipta for COPD, albuterol prn... used once.  COVID 19 screen COVID testing:home test COVID negative. COVID vaccine: 08/04/2019 , 07/09/2019 COVID exposure: No recent travel or known exposure to [place]  The importance of social distancing was discussed today.    Review of Systems  Constitutional:  Positive for malaise/fatigue. Negative for fever and weight loss.  HENT:  Positive for congestion.   Respiratory:  Positive for cough and shortness of breath. Negative for wheezing.      Past Medical History:  Diagnosis Date   Allergy    Anxiety     Arthritis    right shoulder   CAD (coronary artery disease)    a. 2003 Cath: OM1 100%, good collats, RCA 60-70p-->med rx; b. 04/2020 MV: mild ischemia to sm area of mid inflat & apical lat segments. EF >65%; d. 10/2020 Cath: LM 85ost, LAD 30p/m, OM1 100, RCA 99p/m; e. 10/2020 CABG x 3: LIMA->LAD, VG->RPDA, VG->OM2.   Carotid artery disease ([place])    a. 03/2020 Carotid U/S: RICA 2-35%, LICA 57-32%.   COPD (chronic obstructive pulmonary disease) ([place])    Significant Dr. Lamonte Sakai   Diastolic dysfunction    a. 03/2018 Echo: EF 60-65%, no rwma, Gr1 DD. Mildly dil LA. Nl RV fxn.   Dilation of ascending aorta and aortic root ([place])    a. 10/2020 Echo: Asc Ao 57mm.   Diverticulosis of colon    GERD (gastroesophageal reflux disease)    Gout    Hyperlipidemia    Hypertension    LVH (left ventricular hypertrophy)    a. 10/2020 Echo: EF 70-75%, no rwma, mod LVH. Nl RV size/fxn. Mildly dil RA. Triv MR. Asc Ao 23mm.   Prostatitis    Sleep apnea    uses cpap nightly    reports that he quit smoking about 21 years ago. His smoking use included cigarettes. He has a 64.00 pack-year smoking history. He has quit using smokeless tobacco. He reports that he does not drink alcohol and does not use drugs.   Current Outpatient Medications:  albuterol (VENTOLIN HFA) 108 (90 Base) MCG/ACT inhaler, TAKE 2 PUFFS BY MOUTH EVERY 6 HOURS AS NEEDED FOR WHEEZE OR SHORTNESS OF BREATH, Disp: 6.7 each, Rfl: 10   aspirin EC 325 MG EC tablet, Take 1 tablet (325 mg total) by mouth daily., Disp: 30 tablet, Rfl: 0   atorvastatin (LIPITOR) 80 MG tablet, Take 1 tablet (80 mg total) by mouth daily., Disp: 30 tablet, Rfl: 2   Cyanocobalamin (B-12) 3000 MCG SUBL, Place 3,000 mcg under the tongue daily., Disp: , Rfl:    guaiFENesin (MUCINEX) 600 MG 12 hr tablet, Take 1 tablet (600 mg total) by mouth 2 (two) times daily., Disp: 30 tablet, Rfl: PRN   Multiple Vitamin (MULTIVITAMIN) capsule, Take 1 capsule by mouth daily., Disp: , Rfl:     sertraline (ZOLOFT) 50 MG tablet, TAKE 1 TABLET BY MOUTH EVERY DAY, Disp: 90 tablet, Rfl: 1   TRELEGY ELLIPTA 100-62.5-25 MCG/INH AEPB, TAKE 1 PUFF BY MOUTH EVERY DAY, Disp: 180 each, Rfl: 2   Observations/Objective: Blood pressure 122/66, height 5\' 11"  (1.803 m), weight 193 lb (87.5 kg).  Physical Exam  Physical Exam Constitutional:      General: The patient is not in acute distress. Pulmonary:     Effort: Pulmonary effort is normal. No respiratory distress.  Neurological:     Mental Status: The patient is alert and oriented to person, place, and time.  Psychiatric:        Mood and Affect: Mood normal.        Behavior: Behavior normal.   Assessment and Plan Problem List Items Addressed This Visit     COPD with acute exacerbation ([place]) - Primary    Likely COPD exacerbation due to allergic,  viral or bacterial trigger.. cover for bacterial trigger given change in mucus.  COVID less likely given neg home test, but consider retesting 4-5 days into illness if not improving.   Treat with antibiotics, steroid taper, albuterol inhaler prn and continue daily controlled Trelegy Ellipta.  Return sn ER precautions given.      Relevant Medications   azithromycin (ZITHROMAX) 250 MG tablet   predniSONE (DELTASONE) 20 MG tablet      I discussed the assessment and treatment plan with the patient. The patient was provided an opportunity to ask questions and all were answered. The patient agreed with the plan and demonstrated an understanding of the instructions.   The patient was advised to call back or seek an in-person evaluation if the symptoms worsen or if the condition fails to improve as anticipated.     Eliezer Lofts, MD

## 2021-01-17 ENCOUNTER — Other Ambulatory Visit: Payer: Self-pay

## 2021-01-17 DIAGNOSIS — Z951 Presence of aortocoronary bypass graft: Secondary | ICD-10-CM

## 2021-01-17 DIAGNOSIS — Z48812 Encounter for surgical aftercare following surgery on the circulatory system: Secondary | ICD-10-CM | POA: Diagnosis not present

## 2021-01-17 NOTE — Progress Notes (Signed)
Daily Session Note  Patient Details  Name: Hayden Deleon MRN: [id number] Date of Birth: 1956/02/10 Referring Provider:   Flowsheet Row Cardiac Rehab from 12/28/2020 in [facility] Cardiac and Pulmonary Rehab  Referring Provider Fletcher Anon       Encounter Date: 01/17/2021  Check In:  Session Check In - 01/17/21 0716       Check-In   Supervising physician immediately available to respond to emergencies See telemetry face sheet for immediately available ER MD    Location [facility]-Cardiac & Pulmonary Rehab    Staff Present Birdie Sons, MPA, Elveria Rising, BA, ACSM CEP, Exercise Physiologist;Joseph Tessie Fass, [place]    Virtual Visit No    Medication changes reported     No    Fall or balance concerns reported    No    Warm-up and Cool-down Performed on first and last piece of equipment    Resistance Training Performed Yes    VAD Patient? No    PAD/SET Patient? No      Pain Assessment   Currently in Pain? No/denies                Social History   Tobacco Use  Smoking Status Former   Packs/day: 2.00   Years: 32.00   Pack years: 64.00   Types: Cigarettes   Quit date: 08/31/1999   Years since quitting: 21.3  Smokeless Tobacco Former    Goals Met:  Independence with exercise equipment Exercise tolerated well No report of concerns or symptoms today Strength training completed today  Goals Unmet:  Not Applicable  Comments: Pt able to follow exercise prescription today without complaint.  Will continue to monitor for progression.    Dr. Emily Filbert is Medical Director for [place].  Dr. Ottie Glazier is Medical Director for [facility] Pulmonary Rehabilitation.

## 2021-01-18 DIAGNOSIS — H5203 Hypermetropia, bilateral: Secondary | ICD-10-CM | POA: Diagnosis not present

## 2021-01-18 DIAGNOSIS — H52213 Irregular astigmatism, bilateral: Secondary | ICD-10-CM | POA: Diagnosis not present

## 2021-01-18 DIAGNOSIS — H53032 Strabismic amblyopia, left eye: Secondary | ICD-10-CM | POA: Diagnosis not present

## 2021-01-18 DIAGNOSIS — H524 Presbyopia: Secondary | ICD-10-CM | POA: Diagnosis not present

## 2021-01-19 ENCOUNTER — Other Ambulatory Visit: Payer: Self-pay

## 2021-01-19 ENCOUNTER — Other Ambulatory Visit: Payer: Self-pay | Admitting: Family Medicine

## 2021-01-19 ENCOUNTER — Encounter: Payer: BC Managed Care – PPO | Admitting: *Deleted

## 2021-01-19 DIAGNOSIS — Z48812 Encounter for surgical aftercare following surgery on the circulatory system: Secondary | ICD-10-CM | POA: Diagnosis not present

## 2021-01-19 DIAGNOSIS — Z951 Presence of aortocoronary bypass graft: Secondary | ICD-10-CM

## 2021-01-19 MED ORDER — AZITHROMYCIN 250 MG PO TABS: ORAL_TABLET | ORAL | 0 refills | Status: DC

## 2021-01-19 NOTE — Progress Notes (Signed)
Daily Session Note  Patient Details  Name: Jerret Mcbane MRN: [id number] Date of Birth: Mar 16, 1956 Referring Provider:   Flowsheet Row Cardiac Rehab from 12/28/2020 in [facility] Cardiac and Pulmonary Rehab  Referring Provider Fletcher Anon       Encounter Date: 01/19/2021  Check In:  Session Check In - 01/19/21 0733       Check-In   Supervising physician immediately available to respond to emergencies See telemetry face sheet for immediately available ER MD    Location [facility]-Cardiac & Pulmonary Rehab    Staff Present Nyoka Cowden, RN, BSN, Ardeth Sportsman, RDN, LDN;Susanne Bice, RN, BSN, CCRP    Virtual Visit No    Medication changes reported     No    Fall or balance concerns reported    No    Tobacco Cessation No Change    Warm-up and Cool-down Performed on first and last piece of equipment    Resistance Training Performed Yes    VAD Patient? No    PAD/SET Patient? No      Pain Assessment   Currently in Pain? No/denies                Social History   Tobacco Use  Smoking Status Former   Packs/day: 2.00   Years: 32.00   Pack years: 64.00   Types: Cigarettes   Quit date: 08/31/1999   Years since quitting: 21.4  Smokeless Tobacco Former    Goals Met:  Independence with exercise equipment Exercise tolerated well No report of concerns or symptoms today  Goals Unmet:  Not Applicable  Comments: Pt able to follow exercise prescription today without complaint.  Will continue to monitor for progression.    Dr. Emily Filbert is Medical Director for [place].  Dr. Ottie Glazier is Medical Director for [facility] Pulmonary Rehabilitation.

## 2021-01-22 ENCOUNTER — Encounter: Payer: BC Managed Care – PPO | Admitting: *Deleted

## 2021-01-22 ENCOUNTER — Other Ambulatory Visit: Payer: Self-pay | Admitting: Family Medicine

## 2021-01-22 ENCOUNTER — Other Ambulatory Visit: Payer: Self-pay

## 2021-01-22 DIAGNOSIS — Z48812 Encounter for surgical aftercare following surgery on the circulatory system: Secondary | ICD-10-CM | POA: Diagnosis not present

## 2021-01-22 DIAGNOSIS — Z951 Presence of aortocoronary bypass graft: Secondary | ICD-10-CM | POA: Diagnosis not present

## 2021-01-22 NOTE — Progress Notes (Signed)
Daily Session Note  Patient Details  Name: Paulo Keimig MRN: [id number] Date of Birth: 22-Aug-1955 Referring Provider:   Flowsheet Row Cardiac Rehab from 12/28/2020 in [facility] Cardiac and Pulmonary Rehab  Referring Provider Fletcher Anon       Encounter Date: 01/22/2021  Check In:  Session Check In - 01/22/21 0758       Check-In   Supervising physician immediately available to respond to emergencies See telemetry face sheet for immediately available ER MD    Location [facility]-Cardiac & Pulmonary Rehab    Staff Present Heath Lark, RN, BSN, Laveda Norman, BS, ACSM CEP, Exercise Physiologist;Joseph [place], [place]    Virtual Visit No    Medication changes reported     No    Fall or balance concerns reported    No    Warm-up and Cool-down Performed on first and last piece of equipment    Resistance Training Performed Yes    VAD Patient? No    PAD/SET Patient? No      Pain Assessment   Currently in Pain? No/denies                Social History   Tobacco Use  Smoking Status Former   Packs/day: 2.00   Years: 32.00   Pack years: 64.00   Types: Cigarettes   Quit date: 08/31/1999   Years since quitting: 21.4  Smokeless Tobacco Former    Goals Met:  Independence with exercise equipment Exercise tolerated well No report of concerns or symptoms today  Goals Unmet:  Not Applicable  Comments: Pt able to follow exercise prescription today without complaint.  Will continue to monitor for progression.    Dr. Emily Filbert is Medical Director for [place].  Dr. Ottie Glazier is Medical Director for [facility] Pulmonary Rehabilitation.

## 2021-01-24 ENCOUNTER — Other Ambulatory Visit: Payer: Self-pay

## 2021-01-24 DIAGNOSIS — Z951 Presence of aortocoronary bypass graft: Secondary | ICD-10-CM | POA: Diagnosis not present

## 2021-01-24 DIAGNOSIS — Z48812 Encounter for surgical aftercare following surgery on the circulatory system: Secondary | ICD-10-CM | POA: Diagnosis not present

## 2021-01-24 NOTE — Progress Notes (Signed)
Daily Session Note  Patient Details  Name: Hayden Deleon MRN: [id number] Date of Birth: 1955/04/19 Referring Provider:   Flowsheet Row Cardiac Rehab from 12/28/2020 in [facility] Cardiac and Pulmonary Rehab  Referring Provider Fletcher Anon       Encounter Date: 01/24/2021  Check In:  Session Check In - 01/24/21 0716       Check-In   Supervising physician immediately available to respond to emergencies See telemetry face sheet for immediately available ER MD    Location [facility]-Cardiac & Pulmonary Rehab    Staff Present Birdie Sons, MPA, RN;Jessica Kronen[place]etter, MA, RCEP, CCRP, CCET;Joseph [place], [place]    Virtual Visit No    Medication changes reported     No    Fall or balance concerns reported    No    Tobacco Cessation No Change    Warm-up and Cool-down Performed on first and last piece of equipment    Resistance Training Performed Yes    VAD Patient? No    PAD/SET Patient? No      Pain Assessment   Currently in Pain? No/denies                Social History   Tobacco Use  Smoking Status Former   Packs/day: 2.00   Years: 32.00   Pack years: 64.00   Types: Cigarettes   Quit date: 08/31/1999   Years since quitting: 21.4  Smokeless Tobacco Former    Goals Met:  Independence with exercise equipment Exercise tolerated well Personal goals reviewed No report of concerns or symptoms today Strength training completed today  Goals Unmet:  Not Applicable  Comments: Pt able to follow exercise prescription today without complaint.  Will continue to monitor for progression.    Dr. Emily Filbert is Medical Director for [place].  Dr. Ottie Glazier is Medical Director for [facility] Pulmonary Rehabilitation.

## 2021-01-26 ENCOUNTER — Ambulatory Visit (INDEPENDENT_AMBULATORY_CARE_PROVIDER_SITE_OTHER): Payer: BC Managed Care – PPO | Admitting: Nurse Practitioner

## 2021-01-26 ENCOUNTER — Other Ambulatory Visit: Payer: Self-pay | Admitting: Family Medicine

## 2021-01-26 ENCOUNTER — Other Ambulatory Visit: Payer: Self-pay

## 2021-01-26 ENCOUNTER — Encounter: Payer: Self-pay | Admitting: Nurse Practitioner

## 2021-01-26 ENCOUNTER — Encounter: Payer: BC Managed Care – PPO | Admitting: *Deleted

## 2021-01-26 VITALS — BP 122/74 | HR 81 | Ht 71.0 in | Wt 189.0 lb

## 2021-01-26 DIAGNOSIS — G4733 Obstructive sleep apnea (adult) (pediatric): Secondary | ICD-10-CM | POA: Diagnosis not present

## 2021-01-26 DIAGNOSIS — I10 Essential (primary) hypertension: Secondary | ICD-10-CM | POA: Diagnosis not present

## 2021-01-26 DIAGNOSIS — I6523 Occlusion and stenosis of bilateral carotid arteries: Secondary | ICD-10-CM

## 2021-01-26 DIAGNOSIS — E785 Hyperlipidemia, unspecified: Secondary | ICD-10-CM | POA: Diagnosis not present

## 2021-01-26 DIAGNOSIS — Z951 Presence of aortocoronary bypass graft: Secondary | ICD-10-CM | POA: Diagnosis not present

## 2021-01-26 DIAGNOSIS — Z48812 Encounter for surgical aftercare following surgery on the circulatory system: Secondary | ICD-10-CM | POA: Diagnosis not present

## 2021-01-26 DIAGNOSIS — I2510 Atherosclerotic heart disease of native coronary artery without angina pectoris: Secondary | ICD-10-CM | POA: Diagnosis not present

## 2021-01-26 NOTE — Progress Notes (Signed)
Called patients mobile number and left message that we would like him to schedule carotid ultrasound at this convenience with number to call and schedule.

## 2021-01-26 NOTE — Progress Notes (Signed)
Daily Session Note  Patient Details  Name: Hayden Deleon MRN: [id number] Date of Birth: Dec 20, 1955 Referring Provider:   Flowsheet Row Cardiac Rehab from 12/28/2020 in [facility] Cardiac and Pulmonary Rehab  Referring Provider Fletcher Anon       Encounter Date: 01/26/2021  Check In:  Session Check In - 01/26/21 0747       Check-In   Supervising physician immediately available to respond to emergencies See telemetry face sheet for immediately available ER MD    Location [facility]-Cardiac & Pulmonary Rehab    Staff Present Heath Lark, RN, BSN, CCRP;Joseph Ider, RCP,RRT,BSRT;Jessica [place], [place], [place], CCRP, CCET    Virtual Visit No    Fall or balance concerns reported    No    Warm-up and Cool-down Performed on first and last piece of equipment    Resistance Training Performed Yes    VAD Patient? No    PAD/SET Patient? No      Pain Assessment   Currently in Pain? No/denies                Social History   Tobacco Use  Smoking Status Former   Packs/day: 2.00   Years: 32.00   Pack years: 64.00   Types: Cigarettes   Quit date: 08/31/1999   Years since quitting: 21.4  Smokeless Tobacco Former    Goals Met:  Independence with exercise equipment Exercise tolerated well No report of concerns or symptoms today  Goals Unmet:  Not Applicable  Comments: Pt able to follow exercise prescription today without complaint.  Will continue to monitor for progression.    Dr. Emily Filbert is Medical Director for [place].  Dr. Ottie Glazier is Medical Director for [facility] Pulmonary Rehabilitation.

## 2021-01-26 NOTE — Patient Instructions (Addendum)
Medication Instructions:  No changes at this time.  *If you need a refill on your cardiac medications before your next appointment, please call your pharmacy*   Lab Work: None  If you have labs (blood work) drawn today and your tests are completely normal, you will receive your results only by: [place] (if you have MyChart) OR A paper copy in the mail If you have any lab test that is abnormal or we need to change your treatment, we will call you to review the results.   Testing/Procedures: Your physician has requested that you have a carotid duplex at your convenience. This test is an ultrasound of the carotid arteries in your neck. It looks at blood flow through these arteries that supply the brain with blood. Allow one hour for this exam. There are no restrictions or special instructions.    Follow-Up: At [facility], you and your health needs are our priority.  As part of our continuing mission to provide you with exceptional heart care, we have created designated Provider Care Teams.  These Care Teams include your primary Cardiologist (physician) and Advanced Practice Providers (APPs -  Physician Assistants and Nurse Practitioners) who all work together to provide you with the care you need, when you need it.   Your next appointment:   6 month(s)  The format for your next appointment:   In Person  Provider:   [name], MD or Murray Hodgkins, NP

## 2021-01-26 NOTE — Progress Notes (Signed)
Office Visit    Patient Name: Hayden Deleon Date of Encounter: 01/26/2021  Primary Care Provider:  Libby Maw, MD Primary Cardiologist:  [name], MD  Chief Complaint    65 year old male with a history of CAD status post recent CABG, hypertension, hyperlipidemia, COPD, sleep apnea, and carotid arterial disease, presents for follow-up related to CAD status post CABG.  Past Medical History    Past Medical History:  Diagnosis Date   Allergy    Anxiety    Arthritis    right shoulder   CAD (coronary artery disease)    a. 2003 Cath: OM1 100%, good collats, RCA 60-70p-->med rx; b. 04/2020 MV: mild ischemia to sm area of mid inflat & apical lat segments. EF >65%; d. 10/2020 Cath: LM 85ost, LAD 30p/m, OM1 100, RCA 99p/m; e. 10/2020 CABG x 3: LIMA->LAD, VG->RPDA, VG->OM2.   Carotid artery disease ([place])    a. 03/2020 Carotid U/S: RICA 2-83%, LICA 66-29%.   COPD (chronic obstructive pulmonary disease) ([place])    Significant Dr. Lamonte Sakai   Diastolic dysfunction    a. 03/2018 Echo: EF 60-65%, no rwma, Gr1 DD. Mildly dil LA. Nl RV fxn.   Dilation of ascending aorta and aortic root ([place])    a. 10/2020 Echo: Asc Ao 69mm.   Diverticulosis of colon    GERD (gastroesophageal reflux disease)    Gout    Hyperlipidemia    Hypertension    LVH (left ventricular hypertrophy)    a. 10/2020 Echo: EF 70-75%, no rwma, mod LVH. Nl RV size/fxn. Mildly dil RA. Triv MR. Asc Ao 58mm.   Prostatitis    Sleep apnea    uses cpap nightly   Past Surgical History:  Procedure Laterality Date   COLONOSCOPY  12/2003   [place] - polyp   CORONARY ARTERY BYPASS GRAFT N/A 11/05/2020   Procedure: CORONARY ARTERY BYPASS GRAFTING (CABG) TIMES 3, USING LEFT INTERNAL MAMMARY ARTERY AND GREATER SAPHEOUS VEIN HARVESTED ENDOSCOPICALLY LEFT AND RIGHT LEGS;  Surgeon: Lajuana Matte, MD;  Location: [place];  Service: Open Heart Surgery;  Laterality: N/A;   deviated septum     KNEE ARTHROSCOPY     right    LEFT HEART CATH AND CORONARY ANGIOGRAPHY N/A 11/03/2020   Procedure: LEFT HEART CATH AND CORONARY ANGIOGRAPHY;  Surgeon: Wellington Hampshire, MD;  Location: [place] [facility];  Service: Cardiovascular;  Laterality: N/A;   TOTAL SHOULDER ARTHROPLASTY Right 02/03/2018   Procedure: RIGHT REVERSE SHOULDER ARTHROPLASTY;  Surgeon: Meredith Pel, MD;  Location: [place];  Service: Orthopedics;  Laterality: Right;   WISDOM TOOTH EXTRACTION      Allergies  Allergies  Allergen Reactions   Avelox [Moxifloxacin Hcl In Nacl] Other (See Comments)    Facial swelling   Sulfamethoxazole-Trimethoprim Other (See Comments)    REACTION: sore mouth   Imdur [Isosorbide Nitrate] Other (See Comments)    Made pt feel groggy, loopy, bad dreams    Aspirin Other (See Comments)    REACTION: Nausea; fine with 81mg     History of Present Illness    31 male with above past medical history including CAD, hypertension, hyperlipidemia, COPD, sleep apnea, and carotid arterial disease.  Diagnostic catheterization 2003 showed an occluded OM1 with collaterals, as well as a 60 to 70% proximal RCA stenosis.  EF was normal.  He had a prolonged history of stable angina with mild chest pain with overexertion.  Stress testing in January 2022 in the setting of progressive angina showed a  small area of mild ischemia in the inferolateral apical lateral segments.  EF was greater than 65%.  CT imaging showed coronary calcifications and aortic atherosclerosis.  He was also noted to have bilateral groundglass opacities with more focal consolidation versus scarring in the left lower lobe with recommendation for dedicated CT if clinically appropriate.  Due to progressive symptoms he underwent diagnostic catheterization in early August of this year revealing an 85% ostial left main stenosis, occluded OM1, and 99% proximal and mid RCA stenoses.  He was transferred to [facility] and underwent CABG x3 with a LIMA to the LAD, vein graft to the PDA, and  vein graft to OM 2.  Postoperative course was relatively uncomplicated.  Mr. Goetting was last seen in cardiology clinic on August 18, at which time he noted improvement in energy level and activity tolerance.  He reported some orthostasis and was orthostatic on exam and I discontinued his Lasix.  He was doing well at CT surgical follow-up on September 9, and has since participated in cardiac rehabilitation, which he has tolerated well.  He recently had a lung infection which required a prednisone taper and Zithromax, but he feels that he is recovered.  He only missed 1 day of rehab in that setting.  He is going to rehab 3 days a week and exercising at home the 2 other days.  He denies any chest pain, dyspnea, palpitations, PND, orthopnea, dizziness, syncope, edema, or early satiety.  His weight is down from about 219 pounds preoperatively 289 pounds today.  He is back to working full-time but notes that he is taking it easy.  Home Medications    Current Outpatient Medications  Medication Sig Dispense Refill   albuterol (VENTOLIN HFA) 108 (90 Base) MCG/ACT inhaler TAKE 2 PUFFS BY MOUTH EVERY 6 HOURS AS NEEDED FOR WHEEZE OR SHORTNESS OF BREATH 6.7 each 10   aspirin EC 325 MG EC tablet Take 1 tablet (325 mg total) by mouth daily. 30 tablet 0   atorvastatin (LIPITOR) 80 MG tablet Take 1 tablet (80 mg total) by mouth daily. 30 tablet 2   Cyanocobalamin (B-12) 3000 MCG SUBL Place 3,000 mcg under the tongue daily.     guaiFENesin (MUCINEX) 600 MG 12 hr tablet Take 1 tablet (600 mg total) by mouth 2 (two) times daily. 30 tablet PRN   Multiple Vitamin (MULTIVITAMIN) capsule Take 1 capsule by mouth daily.     sertraline (ZOLOFT) 50 MG tablet TAKE 1 TABLET BY MOUTH EVERY DAY 90 tablet 1   TRELEGY ELLIPTA 100-62.5-25 MCG/INH AEPB TAKE 1 PUFF BY MOUTH EVERY DAY 180 each 2   No current facility-administered medications for this visit.     Review of Systems    Doing exceptionally well.  He denies chest pain,  palpitations, dyspnea, pnd, orthopnea, n, v, dizziness, syncope, edema, weight gain, or early satiety.  All other systems reviewed and are otherwise negative except as noted above.  Physical Exam    VS:  BP 122/74   Pulse 81   Ht 5\' 11"  (1.803 m)   Wt 189 lb (85.7 kg)   SpO2 96%   BMI 26.36 kg/m  , BMI Body mass index is 26.36 kg/m.     GEN: Well nourished, well developed, in no acute distress. HEENT: normal. Neck: Supple, no JVD, carotid bruits, or masses. Cardiac: RRR, no murmurs, rubs, or gallops. No clubbing, cyanosis, edema.  Radials/PT 2+ and equal bilaterally.  Respiratory:  Respirations regular and unlabored, clear to auscultation bilaterally.  GI: Soft, nontender, nondistended, BS + x 4. MS: no deformity or atrophy. Skin: warm and dry, no rash. Neuro:  Strength and sensation are intact. Psych: Normal affect.  Accessory Clinical Findings    ECG personally reviewed by me today -regular sinus rhythm, 81, rightward axis- no acute changes.  Lab Results  Component Value Date   WBC 10.0 11/10/2020   HGB 9.7 (L) 11/10/2020   HCT 29.6 (L) 11/10/2020   MCV 99.3 11/10/2020   PLT 207 11/10/2020   Lab Results  Component Value Date   CREATININE 0.97 11/10/2020   BUN 15 11/10/2020   NA 137 11/10/2020   K 3.7 11/10/2020   CL 104 11/10/2020   CO2 25 11/10/2020   Lab Results  Component Value Date   ALT 19 11/04/2020   AST 17 11/04/2020   ALKPHOS 50 11/04/2020   BILITOT 0.9 11/04/2020   Lab Results  Component Value Date   CHOL 85 11/07/2020   HDL 21 (L) 11/07/2020   LDLCALC 34 11/07/2020   LDLDIRECT 71.0 01/03/2016   TRIG 150 (H) 11/07/2020   CHOLHDL 4.0 11/07/2020    Lab Results  Component Value Date   HGBA1C 5.6 11/07/2020    Assessment & Plan    1.  Coronary artery disease: Status post CABG x3 in August with a LIMA to the LAD, vein graft to OM 2, and vein graft to the RPDA.  He has been participating in cardiac rehab and has lost roughly 30 pounds he has  not been experiencing any chest pain or dyspnea.  He remains on aspirin and statin therapy.  2.  Essential hypertension: Patient was orthostatic at his last visit.  This resolved following discontinuation of Lasix, and blood pressure stable today at 122/74.  3.  Hyperlipidemia: LDL 34 with normal LFTs in August.  Continue high potency statin therapy.  4.  Obstructive sleep apnea: Reports compliance with CPAP.  5.  Carotid arterial disease: Mild to moderate bilateral disease by ultrasound in December 2021.  Continue aspirin and statin therapy.  We will arrange for repeat carotid ultrasound later this year.  6.  Disposition: Follow-up carotid ultrasound in December 2022.  Follow-up in clinic in 6 months or sooner if necessary.   Murray Hodgkins, NP 01/26/2021, 8:23 AM

## 2021-01-26 NOTE — Telephone Encounter (Signed)
Called and lvm on pt home phone. Pt needs appt before rx refill request can be approved

## 2021-01-28 DIAGNOSIS — J449 Chronic obstructive pulmonary disease, unspecified: Secondary | ICD-10-CM | POA: Diagnosis not present

## 2021-01-29 ENCOUNTER — Other Ambulatory Visit: Payer: Self-pay

## 2021-01-29 ENCOUNTER — Encounter: Payer: BC Managed Care – PPO | Admitting: *Deleted

## 2021-01-29 DIAGNOSIS — Z951 Presence of aortocoronary bypass graft: Secondary | ICD-10-CM

## 2021-01-29 DIAGNOSIS — Z48812 Encounter for surgical aftercare following surgery on the circulatory system: Secondary | ICD-10-CM | POA: Diagnosis not present

## 2021-01-29 NOTE — Progress Notes (Signed)
Daily Session Note  Patient Details  Name: Hayden Deleon MRN: [id number] Date of Birth: 08/25/1955 Referring Provider:   Flowsheet Row Cardiac Rehab from 12/28/2020 in [facility] Cardiac and Pulmonary Rehab  Referring Provider Fletcher Anon       Encounter Date: 01/29/2021  Check In:  Session Check In - 01/29/21 0808       Check-In   Supervising physician immediately available to respond to emergencies See telemetry face sheet for immediately available ER MD    Location [facility]-Cardiac & Pulmonary Rehab    Staff Present Heath Lark, RN, BSN, [place]aveda Norman, BS, ACSM CEP, Exercise Physiologist;Joseph [place], [place]    Virtual Visit No    Medication changes reported     No    Fall or balance concerns reported    No    Warm-up and Cool-down Performed on first and last piece of equipment    Resistance Training Performed Yes    VAD Patient? No    PAD/SET Patient? No      Pain Assessment   Currently in Pain? No/denies                Social History   Tobacco Use  Smoking Status Former   Packs/day: 2.00   Years: 32.00   Pack years: 64.00   Types: Cigarettes   Quit date: 08/31/1999   Years since quitting: 21.4  Smokeless Tobacco Former    Goals Met:  Independence with exercise equipment Exercise tolerated well No report of concerns or symptoms today  Goals Unmet:  Not Applicable  Comments: Pt able to follow exercise prescription today without complaint.  Will continue to monitor for progression.    Dr. Emily Filbert is Medical Director for [place].  Dr. Ottie Glazier is Medical Director for [facility] Pulmonary Rehabilitation.

## 2021-01-31 ENCOUNTER — Encounter: Payer: Self-pay | Admitting: *Deleted

## 2021-01-31 ENCOUNTER — Other Ambulatory Visit: Payer: Self-pay

## 2021-01-31 ENCOUNTER — Encounter: Payer: BC Managed Care – PPO | Attending: Cardiovascular Disease

## 2021-01-31 DIAGNOSIS — Z951 Presence of aortocoronary bypass graft: Secondary | ICD-10-CM

## 2021-01-31 NOTE — Progress Notes (Signed)
Daily Session Note  Patient Details  Name: Grayton Lobo MRN: [id number] Date of Birth: [birth year] Referring Provider:   Flowsheet Row Cardiac Rehab from 12/28/2020 in [facility] Cardiac and Pulmonary Rehab  Referring Provider Fletcher Anon       Encounter Date: 01/31/2021  Check In:  Session Check In - 01/31/21 0721       Check-In   Supervising physician immediately available to respond to emergencies See telemetry face sheet for immediately available ER MD    Location [facility]-Cardiac [facility] Pulmonary Rehab    Staff Present Birdie Sons, MPA, RN;Joseph Tessie Fass, RCP,RRT,BSRT;Amanda Rio [place], [place], ACSM CEP, Exercise Physiologist    Virtual Visit No    Medication changes reported     No    Fall or balance concerns reported    No    Tobacco Cessation No Change    Warm-up and Cool-down Performed on first and last piece of equipment    Resistance Training Performed Yes    VAD Patient? No    PAD/SET Patient? No      Pain Assessment   Currently in Pain? No/denies                Social History   Tobacco Use  Smoking Status Former   Packs/day: 2.00   Years: 32.00   Pack years: 64.00   Types: Cigarettes   Quit date: 08/31/1999   Years since quitting: 21.4  Smokeless Tobacco Former    Goals Met:  Independence with exercise equipment Exercise tolerated well No report of concerns or symptoms today Strength training completed today  Goals Unmet:  Not Applicable  Comments: Pt able to follow exercise prescription today without complaint.  Will continue to monitor for progression.  Reviewed home exercise with pt today.  Pt plans to walk and use treadmill at home for exercise.  He also has a total gym and free weights that he can use.  Reviewed THR, pulse, RPE, sign and symptoms, pulse oximetery and when to call 911 or MD.  Also discussed weather considerations and indoor options.  Pt voiced understanding.   Dr. Emily Filbert is Medical Director for [place].  Dr.  Ottie Glazier is Medical Director for [facility] Pulmonary Rehabilitation.

## 2021-01-31 NOTE — Progress Notes (Signed)
Cardiac Individual Treatment Plan  Patient Details  Name: Hayden Deleon MRN: [id number] Date of Birth: Jan 05, 1956 Referring Provider:   Flowsheet Row Cardiac Rehab from 12/28/2020 in [facility] Cardiac and Pulmonary Rehab  Referring Provider Arida       Initial Encounter Date:  Flowsheet Row Cardiac Rehab from 12/28/2020 in [facility] Cardiac and Pulmonary Rehab  Date 12/28/20       Visit Diagnosis: S/P CABG x 3  Patient's Home Medications on Admission:  Current Outpatient Medications:    albuterol (VENTOLIN HFA) 108 (90 Base) MCG/ACT inhaler, TAKE 2 PUFFS BY MOUTH EVERY 6 HOURS AS NEEDED FOR WHEEZE OR SHORTNESS OF BREATH, Disp: 6.7 each, Rfl: 10   aspirin EC 325 MG EC tablet, Take 1 tablet (325 mg total) by mouth daily., Disp: 30 tablet, Rfl: 0   atorvastatin (LIPITOR) 80 MG tablet, Take 1 tablet (80 mg total) by mouth daily., Disp: 30 tablet, Rfl: 2   Cyanocobalamin (B-12) 3000 MCG SUBL, Place 3,000 mcg under the tongue daily., Disp: , Rfl:    guaiFENesin (MUCINEX) 600 MG 12 hr tablet, Take 1 tablet (600 mg total) by mouth 2 (two) times daily., Disp: 30 tablet, Rfl: PRN   Multiple Vitamin (MULTIVITAMIN) capsule, Take 1 capsule by mouth daily., Disp: , Rfl:    sertraline (ZOLOFT) 50 MG tablet, TAKE 1 TABLET BY MOUTH EVERY DAY, Disp: 90 tablet, Rfl: 1   TRELEGY ELLIPTA 100-62.5-25 MCG/INH AEPB, TAKE 1 PUFF BY MOUTH EVERY DAY, Disp: 180 each, Rfl: 2  Past Medical History: Past Medical History:  Diagnosis Date   Allergy    Anxiety    Arthritis    right shoulder   CAD (coronary artery disease)    a. 2003 Cath: OM1 100%, good collats, RCA 60-70p-->med rx; b. 04/2020 MV: mild ischemia to sm area of mid inflat & apical lat segments. EF >65%; d. 10/2020 Cath: LM 85ost, LAD 30p/m, OM1 100, RCA 99p/m; e. 10/2020 CABG x 3: LIMA->LAD, VG->RPDA, VG->OM2.   Carotid artery disease ([place])    a. 03/2020 Carotid U/S: RICA 6-59%, LICA 93-57%.   COPD (chronic obstructive pulmonary disease) ([place])     Significant Dr. Lamonte Sakai   Diastolic dysfunction    a. 03/2018 Echo: EF 60-65%, no rwma, Gr1 DD. Mildly dil LA. Nl RV fxn.   Dilation of ascending aorta and aortic root ([place])    a. 10/2020 Echo: Asc Ao 34m.   Diverticulosis of colon    GERD (gastroesophageal reflux disease)    Gout    Hyperlipidemia    Hypertension    LVH (left ventricular hypertrophy)    a. 10/2020 Echo: EF 70-75%, no rwma, mod LVH. Nl RV size/fxn. Mildly dil RA. Triv MR. Asc Ao 426m   Prostatitis    Sleep apnea    uses cpap nightly    Tobacco Use: Social History   Tobacco Use  Smoking Status Former   Packs/day: 2.00   Years: 32.00   Pack years: 64.00   Types: Cigarettes   Quit date: 08/31/1999   Years since quitting: 21.4  Smokeless Tobacco Former    Labs: Recent ReGovernment social research officeror ITP Cardiac and Pulmonary Rehab Latest Ref Rng & Units 11/05/2020 11/05/2020 11/06/2020 11/06/2020 11/07/2020   Cholestrol 0 - 200 mg/dL - - - - 85   LDLCALC 0 - 99 mg/dL - - - - 34   LDLDIRECT mg/dL - - - - -   HDL >40 mg/dL - - - - 21(L)  Trlycerides <150 mg/dL - - - - 150(H)   Hemoglobin A1c 4.8 - 5.6 % - - - - 5.6   PHART 7.350 - 7.450 7.194(LL) 7.234(L) 7.389 7.362 -   PCO2ART 32.0 - 48.0 mmHg 56.3(H) 50.1(H) 32.8 35.2 -   HCO3 20.0 - 28.0 mmol/L 21.8 21.0 19.8(L) 19.9(L) -   TCO2 22 - 32 mmol/L 23 22 21(L) 21(L) -   ACIDBASEDEF 0.0 - 2.0 mmol/L 6.0(H) 6.0(H) 5.0(H) 5.0(H) -   O2SAT % 92.0 87.0 95.0 89.0 -        Exercise Target Goals: Exercise Program Goal: Individual exercise prescription set using results from initial 6 min walk test and THRR while considering  patient's activity barriers and safety.   Exercise Prescription Goal: Initial exercise prescription builds to 30-45 minutes a day of aerobic activity, 2-3 days per week.  Home exercise guidelines will be given to patient during program as part of exercise prescription that the participant will acknowledge.   Education: Aerobic Exercise: -  Group verbal and visual presentation on the components of exercise prescription. Introduces F.I.T.T principle from ACSM for exercise prescriptions.  Reviews F.I.T.T. principles of aerobic exercise including progression. Written material given at graduation. Flowsheet Row Cardiac Rehab from 01/24/2021 in [facility] Cardiac and Pulmonary Rehab  Education need identified 12/28/20  Date 01/24/21  Educator [facility]  Instruction Review Code 1- Verbalizes Understanding       Education: Resistance Exercise: - Group verbal and visual presentation on the components of exercise prescription. Introduces F.I.T.T principle from ACSM for exercise prescriptions  Reviews F.I.T.T. principles of resistance exercise including progression. Written material given at graduation.    Education: Exercise & Equipment Safety: - Individual verbal instruction and demonstration of equipment use and safety with use of the equipment. Flowsheet Row Cardiac Rehab from 01/24/2021 in [facility] Cardiac and Pulmonary Rehab  Date 12/28/20  Educator AS  Instruction Review Code 1- Verbalizes Understanding       Education: Exercise Physiology & General Exercise Guidelines: - Group verbal and written instruction with models to review the exercise physiology of the cardiovascular system and associated critical values. Provides general exercise guidelines with specific guidelines to those with heart or lung disease.    Education: Flexibility, Balance, Mind/Body Relaxation: - Group verbal and visual presentation with interactive activity on the components of exercise prescription. Introduces F.I.T.T principle from ACSM for exercise prescriptions. Reviews F.I.T.T. principles of flexibility and balance exercise training including progression. Also discusses the mind body connection.  Reviews various relaxation techniques to help reduce and manage stress (i.e. Deep breathing, progressive muscle relaxation, and visualization). Balance handout provided to  take home. Written material given at graduation.   Activity Barriers & Risk Stratification:  Activity Barriers & Cardiac Risk Stratification - 12/22/20 1004       Activity Barriers & Cardiac Risk Stratification   Activity Barriers Other (comment)    Comments COPD; history of shoulder replacement (doesn't cause issues)    Cardiac Risk Stratification High             6 Minute Walk:  6 Minute Walk     Row Name 12/28/20 1525         6 Minute Walk   Phase Initial     Distance 1115 feet     Walk Time 6 minutes     # of Rest Breaks 0     MPH 2.1     METS 3     RPE 11     Perceived Dyspnea  0     VO2 Peak 10.5     Symptoms No     Resting HR 81 bpm     Resting BP 138/66     Resting Oxygen Saturation  95 %     Exercise Oxygen Saturation  during 6 min walk 91 %     Max Ex. HR 97 bpm     Max Ex. BP 160/72     2 Minute Post BP 130/66              Oxygen Initial Assessment:   Oxygen Re-Evaluation:   Oxygen Discharge (Final Oxygen Re-Evaluation):   Initial Exercise Prescription:  Initial Exercise Prescription - 12/28/20 1500       Date of Initial Exercise RX and Referring Provider   Date 12/28/20    Referring Provider Arida      Treadmill   MPH 2    Grade 1.5    Minutes 15    METs 3      NuStep   Level 3    SPM 80    Minutes 15    METs 3      Elliptical   Level 1    Speed 2.5    Minutes 15    METs 3      REL-XR   Level 3    Speed 50    Minutes 15    METs 3      T5 Nustep   Level 2    SPM 80    Minutes 15    METs 3      Prescription Details   Frequency (times per week) 3    Duration Progress to 30 minutes of continuous aerobic without signs/symptoms of physical distress      Intensity   THRR 40-80% of Max Heartrate 110-140    Ratings of Perceived Exertion 11-15    Perceived Dyspnea 0-4      Resistance Training   Training Prescription Yes    Weight 3 lb    Reps 10-15             Perform Capillary Blood Glucose checks  as needed.  Exercise Prescription Changes:   Exercise Prescription Changes     Row Name 12/28/20 1500 01/01/21 1500 01/17/21 1500 01/29/21 1000       Response to Exercise   Blood Pressure (Admit) 138/66 130/76 142/72 110/62    Blood Pressure (Exercise) 160/72 142/58 158/70 --    Blood Pressure (Exit) 130/66 124/76 108/66 102/69    Heart Rate (Admit) 81 bpm 84 bpm 80 bpm 94 bpm    Heart Rate (Exercise) 97 bpm 110 bpm 115 bpm 104 bpm    Heart Rate (Exit) 87 bpm 96 bpm 100 bpm 86 bpm    Oxygen Saturation (Admit) 95 % -- -- --    Oxygen Saturation (Exercise) 91 % -- -- --    Rating of Perceived Exertion (Exercise) _0 Perceived Dyspnea (Exercise) 0 -- -- --    Symptoms none none none none    Comments -- first full day of exercise -- --    Duration -- Progress to 30 minutes of  aerobic without signs/symptoms of physical distress Continue with 30 min of aerobic exercise without signs/symptoms of physical distress. Continue with 30 min of aerobic exercise without signs/symptoms of physical distress.    Intensity -- THRR unchanged THRR unchanged THRR unchanged      Progression   Progression -- Continue to  progress workloads to maintain intensity without signs/symptoms of physical distress. Continue to progress workloads to maintain intensity without signs/symptoms of physical distress. Continue to progress workloads to maintain intensity without signs/symptoms of physical distress.    Average METs -- 2.9 2.87 3.12      Resistance Training   Training Prescription -- Yes Yes Yes    Weight -- 3 lb 3 lb 5 lb    Reps -- 10-15 10-15 10-15      Interval Training   Interval Training -- No No No      Treadmill   MPH -- 2 2 2.5    Grade -- 1.5 2 1.5    Minutes -- _0 METs -- 2.94 3.14 3.43      NuStep   Level -- 3 -- --    Minutes -- 15 -- --    METs -- 3 -- --      REL-XR   Level -- -- -- 4    Minutes -- -- -- 15      T5 Nustep   Level -- -- 3 4    Minutes --  -- 15 15    METs -- -- 2.6 3      Oxygen   Maintain Oxygen Saturation -- 88% or higher 88% or higher 88% or higher             Exercise Comments:   Exercise Comments     Row Name 01/01/21 3500           Exercise Comments First full day of exercise!  Patient was oriented to gym and equipment including functions, settings, policies, and procedures.  Patient's individual exercise prescription and treatment plan were reviewed.  All starting workloads were established based on the results of the 6 minute walk test done at initial orientation visit.  The plan for exercise progression was also introduced and progression will be customized based on patient's performance and goals.                Exercise Goals and Review:   Exercise Goals     Row Name 12/28/20 1528             Exercise Goals   Increase Physical Activity Yes       Intervention Provide advice, education, support and counseling about physical activity/exercise needs.;Develop an individualized exercise prescription for aerobic and resistive training based on initial evaluation findings, risk stratification, comorbidities and participant's personal goals.       Expected Outcomes Short Term: Attend rehab on a regular basis to increase amount of physical activity.;Long Term: Add in home exercise to make exercise part of routine and to increase amount of physical activity.;Long Term: Exercising regularly at least 3-5 days a week.       Increase Strength and Stamina Yes       Intervention Provide advice, education, support and counseling about physical activity/exercise needs.;Develop an individualized exercise prescription for aerobic and resistive training based on initial evaluation findings, risk stratification, comorbidities and participant's personal goals.       Expected Outcomes Short Term: Increase workloads from initial exercise prescription for resistance, speed, and METs.;Short Term: Perform resistance  training exercises routinely during rehab and add in resistance training at home;Long Term: Improve cardiorespiratory fitness, muscular endurance and strength as measured by increased METs and functional capacity (6MWT)       Able to understand and use rate of perceived exertion (RPE) scale Yes  Intervention Provide education and explanation on how to use RPE scale       Expected Outcomes Short Term: Able to use RPE daily in rehab to express subjective intensity level;Long Term:  Able to use RPE to guide intensity level when exercising independently       Able to understand and use Dyspnea scale Yes       Intervention Provide education and explanation on how to use Dyspnea scale       Expected Outcomes Short Term: Able to use Dyspnea scale daily in rehab to express subjective sense of shortness of breath during exertion;Long Term: Able to use Dyspnea scale to guide intensity level when exercising independently       Knowledge and understanding of Target Heart Rate Range (THRR) Yes       Intervention Provide education and explanation of THRR including how the numbers were predicted and where they are located for reference       Expected Outcomes Short Term: Able to state/look up THRR;Short Term: Able to use daily as guideline for intensity in rehab;Long Term: Able to use THRR to govern intensity when exercising independently       Able to check pulse independently Yes       Intervention Provide education and demonstration on how to check pulse in carotid and radial arteries.;Review the importance of being able to check your own pulse for safety during independent exercise       Expected Outcomes Short Term: Able to explain why pulse checking is important during independent exercise;Long Term: Able to check pulse independently and accurately       Understanding of Exercise Prescription Yes       Intervention Provide education, explanation, and written materials on patient's individual exercise  prescription       Expected Outcomes Short Term: Able to explain program exercise prescription;Long Term: Able to explain home exercise prescription to exercise independently                Exercise Goals Re-Evaluation :  Exercise Goals Re-Evaluation     Row Name 01/01/21 0733 01/17/21 1600 01/29/21 1158         Exercise Goal Re-Evaluation   Exercise Goals Review Increase Physical Activity;Able to understand and use rate of perceived exertion (RPE) scale;Understanding of Exercise Prescription;Knowledge and understanding of Target Heart Rate Range (THRR);Increase Strength and Stamina;Able to understand and use Dyspnea scale;Able to check pulse independently Increase Physical Activity;Increase Strength and Stamina Increase Physical Activity;Increase Strength and Stamina     Comments Reviewed RPE and dyspnea scales, THR and program prescription with pt today.  Pt voiced understanding and was given a copy of goals to take home. Hayden Deleon is progressing well and has increased levels on TM and T5.  Staff will encourage increasing to 4 or 5 lb for strength work Rashidi is doing well in rehab. He did increase his handweights to 5 lbs! He also increased his workload on the treadmill and increased his speed to 2.5. RPEs are in appropriate range. Will continue to monitor.     Expected Outcomes Short: Use RPE daily to regulate intensity. Long: Follow program prescription in THR. Short: try 4 lb Long : build overall stamina Short: Continue to increase loads on treadmill as tolerated Long: Continue to increase overall MET level              Discharge Exercise Prescription (Final Exercise Prescription Changes):  Exercise Prescription Changes - 01/29/21 1000  Response to Exercise   Blood Pressure (Admit) 110/62    Blood Pressure (Exit) 102/69    Heart Rate (Admit) 94 bpm    Heart Rate (Exercise) 104 bpm    Heart Rate (Exit) 86 bpm    Rating of Perceived Exertion (Exercise) 12    Symptoms none     Duration Continue with 30 min of aerobic exercise without signs/symptoms of physical distress.    Intensity THRR unchanged      Progression   Progression Continue to progress workloads to maintain intensity without signs/symptoms of physical distress.    Average METs 3.12      Resistance Training   Training Prescription Yes    Weight 5 lb    Reps 10-15      Interval Training   Interval Training No      Treadmill   MPH 2.5    Grade 1.5    Minutes 15    METs 3.43      REL-XR   Level 4    Minutes 15      T5 Nustep   Level 4    Minutes 15    METs 3      Oxygen   Maintain Oxygen Saturation 88% or higher             Nutrition:  Target Goals: Understanding of nutrition guidelines, daily intake of sodium <1526m, cholesterol <2091m calories 30% from fat and 7% or less from saturated fats, daily to have 5 or more servings of fruits and vegetables.  Education: All About Nutrition: -Group instruction provided by verbal, written material, interactive activities, discussions, models, and posters to present general guidelines for heart healthy nutrition including fat, fiber, MyPlate, the role of sodium in heart healthy nutrition, utilization of the nutrition label, and utilization of this knowledge for meal planning. Follow up email sent as well. Written material given at graduation. [facility]  Education need identified 12/28/20       Biometrics:  Pre Biometrics - 12/28/20 1530       Pre Biometrics   Height 5' 11" (1.803 m)    Weight 196 lb 4.8 oz (89 kg)    BMI (Calculated) 27.39    Single Leg Stand 30 seconds              Nutrition Therapy Plan and Nutrition Goals:  Nutrition Therapy & Goals - 01/08/21 1501       Nutrition Therapy   Diet Heart healthy, low Na    Drug/Food Interactions Statins/Certain Fruits    Fiber 30 grams    Whole Grain Foods 3 servings    Saturated Fats 12 max.  grams    Fruits and Vegetables 8 servings/day    Sodium 1.5 grams      Personal Nutrition Goals   Nutrition Goal ST: swap out grits for oatmeal 2-3x/week, add eggs or nuts and seeds to breakfast/ mid morning snack, try out recipes to sneak in vegetables LT: maintain current changes made    Comments Today B: grits (no butter, no salt) S: 4 small club crackers L: healthy choice (tu[place] mashed potatoes and vegetables) with diet soda S: poptart D: baked chicken and vegetables (variety of non-starchy vegetables, he does not like greens) and sometimes corn. He alters the recipes he had; such as no salt added tomato sauce to his chili. He is not very big on vegetables. Usually drinks water. When he goes out to  eat will split the portions. He has stopped eating fast food and honey buns. He loves cooking. He works in [employer] where he gets free bread - white wheat. Discussed heart healthy eating.      Intervention Plan   Intervention Prescribe, educate and counsel regarding individualized specific dietary modifications aiming towards targeted core components such as weight, hypertension, lipid management, diabetes, heart failure and other comorbidities.;Nutrition handout(s) given to patient.    Expected Outcomes Short Term Goal: Understand basic principles of dietary content, such as calories, fat, sodium, cholesterol and nutrients.;Short Term Goal: A plan has been developed with personal nutrition goals set during dietitian appointment.;Long Term Goal: Adherence to prescribed nutrition plan.             Nutrition Assessments:  MEDIFICTS Score Key: ?70 Need to make dietary changes  40-70 Heart Healthy Diet ? 40 Therapeutic Level Cholesterol Diet  Flowsheet Row Cardiac Rehab from 12/28/2020 in [facility] Cardiac and Pulmonary Rehab  Picture Your Plate Total Score on Admission 74      Picture Your Plate Scores: <28 Unhealthy dietary pattern with much room for improvement. 41-50 Dietary pattern  unlikely to meet recommendations for good health and room for improvement. 51-60 More healthful dietary pattern, with some room for improvement.  >60 Healthy dietary pattern, although there may be some specific behaviors that could be improved.    Nutrition Goals Re-Evaluation:   Nutrition Goals Discharge (Final Nutrition Goals Re-Evaluation):   Psychosocial: Target Goals: Acknowledge presence or absence of significant depression and/or stress, maximize coping skills, provide positive support system. Participant is able to verbalize types and ability to use techniques and skills needed for reducing stress and depression.   Education: Stress, Anxiety, and Depression - Group verbal and visual presentation to define topics covered.  Reviews how body is impacted by stress, anxiety, and depression.  Also discusses healthy ways to reduce stress and to treat/manage anxiety and depression.  Written material given at graduation.   Education: Sleep Hygiene -Provides group verbal and written instruction about how sleep can affect your health.  Define sleep hygiene, discuss sleep cycles and impact of sleep habits. Review good sleep hygiene tips.    Initial Review & Psychosocial Screening:  Initial Psych Review & Screening - 12/22/20 1007       Initial Review   Current issues with None Identified      Family Dynamics   Good Support System? Yes   wife, mother in law, friends, coworkers     Barriers   Psychosocial barriers to participate in program There are no identifiable barriers or psychosocial needs.      Screening Interventions   Interventions Encouraged to exercise;To provide support and resources with identified psychosocial needs;Provide feedback about the scores to participant    Expected Outcomes Short Term goal: Utilizing psychosocial counselor, staff and physician to assist with identification of specific Stressors or current issues interfering with healing process. Setting desired  goal for each stressor or current issue identified.;Long Term Goal: Stressors or current issues are controlled or eliminated.;Long Term goal: The participant improves quality of Life and PHQ9 Scores as seen by post scores and/or verbalization of changes;Short Term goal: Identification and review with participant of any Quality of Life or Depression concerns found by scoring the questionnaire.             Quality of Life Scores:   Quality of Life - 12/28/20 1521       Quality of Life   Select Quality of  Life      Quality of Life Scores   Health/Function Pre 27.86 %    Socioeconomic Pre 25.5 %    Psych/Spiritual Pre 30 %    Family Pre 25.8 %    GLOBAL Pre 27.86 %            Scores of 19 and below usually indicate a poorer quality of life in these areas.  A difference of  2-3 points is a clinically meaningful difference.  A difference of 2-3 points in the total score of the Quality of Life Index has been associated with significant improvement in overall quality of life, self-image, physical symptoms, and general health in studies assessing change in quality of life.  PHQ-9: Recent Review Flowsheet Data     Depression screen [facility] 2/9 12/28/2020 12/21/2018 04/02/2017   Decreased Interest 0 0 0   Down, Depressed, Hopeless 0 0 0   PHQ - 2 Score 0 0 0   Altered sleeping 0 - -   Tired, decreased energy 1 - -   Change in appetite 0 - -   Feeling bad or failure about yourself  0 - -   Trouble concentrating 0 - -   Moving slowly or fidgety/restless 0 - -   Suicidal thoughts 0 - -   PHQ-9 Score 1 - -   Difficult doing work/chores Not difficult at all - -      Interpretation of Total Score  Total Score Depression Severity:  1-4 = Minimal depression, 5-9 = Mild depression, 10-14 = Moderate depression, 15-19 = Moderately severe depression, 20-27 = Severe depression   Psychosocial Evaluation and Intervention:  Psychosocial Evaluation - 12/22/20 1027       Psychosocial Evaluation &  Interventions   Interventions Encouraged to exercise with the program and follow exercise prescription    Comments Hayden Deleon reports feeling quite well post CABG. He has noted his COPD symptoms feel better as well. He has been walking a few miles a week and is back to work part time. 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 His wife, mother in law, friends and coworkers have been a great support system during his recovery. He and his wife have been making a lot of diet changes since surgery and he has lost 23 lbs since being admitted for CABG. He feels a lot better and is motivated to keep making heart healthy choices.    Expected Outcomes Short: attend cardiac rehab for education and exercise. Long: develop and maintian positive self care habits.    Continue Psychosocial Services  Follow up required by staff             Psychosocial Re-Evaluation:  Psychosocial Re-Evaluation     Santel Name 01/24/21 [phone]             Psychosocial Re-Evaluation   Comments Patient reports no issues with their current mental states, sleep, stress, depression or anxiety. Will follow up with patient in a few weeks for any changes.       Expected Outcomes Short: Continue to exercise regularly to support mental health and notify staff of any changes. Long: maintain mental health and well being through teaching of rehab or prescribed medications independently.       Interventions Encouraged to attend Cardiac Rehabilitation for the exercise       Continue Psychosocial Services  Follow up required by staff  Psychosocial Discharge (Final Psychosocial Re-Evaluation):  Psychosocial Re-Evaluation - 01/24/21 0742       Psychosocial Re-Evaluation   Comments Patient reports no issues with their current mental states, sleep, stress, depression or anxiety. Will follow up with patient in a few weeks for any changes.     Expected Outcomes Short: Continue to exercise regularly to support mental health and notify staff of any changes. Long: maintain mental health and well being through teaching of rehab or prescribed medications independently.    Interventions Encouraged to attend Cardiac Rehabilitation for the exercise    Continue Psychosocial Services  Follow up required by staff             Vocational Rehabilitation: Provide vocational rehab assistance to qualifying candidates.   Vocational Rehab Evaluation & Intervention:  Vocational Rehab - 12/22/20 1006       Initial Vocational Rehab Evaluation & Intervention   Assessment shows need for Vocational Rehabilitation No             Education: Education Goals: Education classes will be provided on a variety of topics geared toward better understanding of heart health and risk factor modification. Participant will state understanding/return demonstration of topics presented as noted by education test scores.  Learning Barriers/Preferences:  Learning Barriers/Preferences - 12/22/20 1006       Learning Barriers/Preferences   Learning Barriers None             General Cardiac Education Topics:  AED/CPR: - Group verbal and written instruction with the use of models to demonstrate the basic use of the AED with the basic ABC's of resuscitation.   Anatomy and Cardiac Procedures: - Group verbal and visual presentation and models provide information about basic cardiac anatomy and function. Reviews the testing methods done to diagnose heart disease and the outcomes of the test results. Describes the treatment choices: Medical Management, Angioplasty, or Coronary Bypass Surgery for treating various heart conditions including Myocardial Infarction, Angina, Valve Disease, and Cardiac Arrhythmias.  Written material given at graduation. Flowsheet Row Cardiac Rehab from 01/24/2021 in [facility] Cardiac and Pulmonary Rehab  Education need identified  12/28/20       Medication Safety: - Group verbal and visual instruction to review commonly prescribed medications for heart and lung disease. Reviews the medication, class of the drug, and side effects. Includes the steps to properly store meds and maintain the prescription regimen.  Written material given at graduation.   Intimacy: - Group verbal instruction through game format to discuss how heart and lung disease can affect sexual intimacy. Written material given at graduation.. Flowsheet Row Cardiac Rehab from 01/24/2021 in [facility] Cardiac and Pulmonary Rehab  Date 01/24/21  Educator [facility]  Instruction Review Code 1- Verbalizes Understanding       Know Your Numbers and Heart Failure: - Group verbal and visual instruction to discuss disease risk factors for cardiac and pulmonary disease and treatment options.  Reviews associated critical values for Overweight/Obesity, Hypertension, Cholesterol, and Diabetes.  Discusses basics of heart failure: signs/symptoms and treatments.  Introduces Heart Failure Zone chart for action plan for heart failure.  Written material given at graduation.   Infection Prevention: - Provides verbal and written material to individual with discussion of infection control including proper hand washing and proper equipment cleaning during exercise session. Flowsheet Row Cardiac Rehab from 01/24/2021 in [facility] Cardiac and Pulmonary Rehab  Date 12/28/20  Educator AS  Instruction Review Code 1- Verbalizes Understanding       Falls Prevention: - Provides  verbal and written material to individual with discussion of falls prevention and safety. Flowsheet Row Cardiac Rehab from 01/24/2021 in [facility] Cardiac and Pulmonary Rehab  Date 12/28/20  Educator AS  Instruction Review Code 1- Verbalizes Understanding       Other: -Provides group and verbal instruction on various topics (see comments)   Knowledge Questionnaire Score:  Knowledge Questionnaire Score -  12/28/20 1521       Knowledge Questionnaire Score   Pre Score 22/26             Core Components/Risk Factors/Patient Goals at Admission:  Personal Goals and Risk Factors at Admission - 12/28/20 1530       Core Components/Risk Factors/Patient Goals on Admission   Hypertension Yes    Intervention Provide education on lifestyle modifcations including regular physical activity/exercise, weight management, moderate sodium restriction and increased consumption of fresh fruit, vegetables, and low fat dairy, alcohol moderation, and smoking cessation.;Monitor prescription use compliance.    Expected Outcomes Short Term: Continued assessment and intervention until BP is < 140/59m HG in hypertensive participants. < 130/866mHG in hypertensive participants with diabetes, heart failure or chronic kidney disease.;Long Term: Maintenance of blood pressure at goal levels.    Lipids Yes    Intervention Provide education and support for participant on nutrition & aerobic/resistive exercise along with prescribed medications to achieve LDL <7062mHDL >83m13m  Expected Outcomes Short Term: Participant states understanding of desired cholesterol values and is compliant with medications prescribed. Participant is following exercise prescription and nutrition guidelines.;Long Term: Cholesterol controlled with medications as prescribed, with individualized exercise RX and with personalized nutrition plan. Value goals: LDL < 70mg69mL > 40 mg.             Education:Diabetes - Individual verbal and written instruction to review signs/symptoms of diabetes, desired ranges of glucose level fasting, after meals and with exercise. Acknowledge that pre and post exercise glucose checks will be done for 3 sessions at entry of program.   Core Components/Risk Factors/Patient Goals Review:   Goals and Risk Factor Review     Row Name 01/24/21 0743             Core Components/Risk Factors/Patient Goals Review    Personal Goals Review Hypertension       Review Hayden Deleon has been checking his b,oood pressure at home and routinely. He states that it has been on average 116's over 70's. Informed him that his is good to check his blood pressure at home and continue to do so. His blood pressure in the program has been doing well and has no questions about it.       Expected Outcomes Short" check blood pressures at home. Long: maintain blood pressure reading at home independently                Core Components/Risk Factors/Patient Goals at Discharge (Final Review):   Goals and Risk Factor Review - 01/24/21 0743       Core Components/Risk Factors/Patient Goals Review   Personal Goals Review Hypertension    Review Hayden Deleon has been checking his b,oood pressure at home and routinely. He states that it has been on average 116's over 70's. Informed him that his is good to check his blood pressure at home and continue to do so. His blood pressure in the program has been doing well and has no questions about it.    Expected Outcomes Short" check blood pressures at home. Long: maintain blood pressure reading at  home independently             ITP Comments:  ITP Comments     Row Name 12/22/20 1025 12/28/20 1537 01/01/21 0733 01/03/21 0821 01/08/21 1533   ITP Comments Initial telephone orientation completed. Diagnosis can be found in [facility] 8/5. EP orientation scheduled for Thursday 9/29 at 2pm. Completed 6MWT and gym orientation. Initial ITP created and sent for review to Dr. Emily Filbert, Medical Director. First full day of exercise!  Patient was oriented to gym and equipment including functions, settings, policies, and procedures.  Patient's individual exercise prescription and treatment plan were reviewed.  All starting workloads were established based on the results of the 6 minute walk test done at initial orientation visit.  The plan for exercise progression was also introduced and progression will be customized based on  patient's performance and goals. 30 day review completed. ITP sent to Dr. Emily Filbert, Medical Director of Cardiac Rehab. Continue with ITP unless changes are made by physician. Completed initial RD consultation    Row Name 01/31/21 0645           ITP Comments 30 Day review completed. Medical Director ITP review done, changes made as directed, and signed approval by Medical Director.                Comments:  30 Day review completed. Medical Director ITP review done, changes made as directed, and signed approval by Medical Director.

## 2021-02-02 ENCOUNTER — Encounter: Payer: BC Managed Care – PPO | Admitting: *Deleted

## 2021-02-02 ENCOUNTER — Other Ambulatory Visit: Payer: Self-pay

## 2021-02-02 DIAGNOSIS — Z951 Presence of aortocoronary bypass graft: Secondary | ICD-10-CM

## 2021-02-02 NOTE — Progress Notes (Signed)
Daily Session Note  Patient Details  Name: Hayden Deleon MRN: [id number] Date of Birth: 09/19/1955 Referring Provider:   Flowsheet Row Cardiac Rehab from 12/28/2020 in [facility] Cardiac and Pulmonary Rehab  Referring Provider Fletcher Anon       Encounter Date: 02/02/2021  Check In:  Session Check In - 02/02/21 0803       Check-In   Supervising physician immediately available to respond to emergencies See telemetry face sheet for immediately available ER MD    Location [facility]-Cardiac & Pulmonary Rehab    Staff Present Heath Lark, RN, BSN, CCRP;Jessica Casas Adobes, MA, RCEP, CCRP, CCET;Joseph [place], [place]    Virtual Visit No    Medication changes reported     No    Fall or balance concerns reported    No    Warm-up and Cool-down Performed on first and last piece of equipment    Resistance Training Performed Yes    VAD Patient? No    PAD/SET Patient? No      Pain Assessment   Currently in Pain? No/denies                Social History   Tobacco Use  Smoking Status Former   Packs/day: 2.00   Years: 32.00   Pack years: 64.00   Types: Cigarettes   Quit date: 08/31/1999   Years since quitting: 21.4  Smokeless Tobacco Former    Goals Met:  Independence with exercise equipment Exercise tolerated well No report of concerns or symptoms today  Goals Unmet:  Not Applicable  Comments: Pt able to follow exercise prescription today without complaint.  Will continue to monitor for progression.    Dr. Emily Filbert is Medical Director for [place].  Dr. Ottie Glazier is Medical Director for [facility] Pulmonary Rehabilitation.

## 2021-02-04 ENCOUNTER — Other Ambulatory Visit: Payer: Self-pay | Admitting: Physician Assistant

## 2021-02-05 ENCOUNTER — Encounter: Payer: BC Managed Care – PPO | Admitting: *Deleted

## 2021-02-05 ENCOUNTER — Other Ambulatory Visit: Payer: Self-pay

## 2021-02-05 DIAGNOSIS — Z951 Presence of aortocoronary bypass graft: Secondary | ICD-10-CM | POA: Diagnosis not present

## 2021-02-05 NOTE — Progress Notes (Signed)
Daily Session Note  Patient Details  Name: Hayden Deleon MRN: [id number] Date of Birth: Aug 14, 1955 Referring Provider:   Flowsheet Row Cardiac Rehab from 12/28/2020 in [facility] Cardiac and Pulmonary Rehab  Referring Provider Fletcher Anon       Encounter Date: 02/05/2021  Check In:  Session Check In - 02/05/21 0800       Check-In   Supervising physician immediately available to respond to emergencies See telemetry face sheet for immediately available ER MD    Location [facility]-Cardiac & Pulmonary Rehab    Staff Present Heath Lark, RN, BSN, Laveda Norman, BS, ACSM CEP, Exercise Physiologist;Joseph [place], [place]    Virtual Visit No    Medication changes reported     No    Fall or balance concerns reported    No    Warm-up and Cool-down Performed on first and last piece of equipment    Resistance Training Performed Yes    VAD Patient? No    PAD/SET Patient? No      Pain Assessment   Currently in Pain? No/denies                Social History   Tobacco Use  Smoking Status Former   Packs/day: 2.00   Years: 32.00   Pack years: 64.00   Types: Cigarettes   Quit date: 08/31/1999   Years since quitting: 21.4  Smokeless Tobacco Former    Goals Met:  Independence with exercise equipment Exercise tolerated well No report of concerns or symptoms today  Goals Unmet:  Not Applicable  Comments: Pt able to follow exercise prescription today without complaint.  Will continue to monitor for progression.    Dr. Emily Filbert is Medical Director for [place].  Dr. Ottie Glazier is Medical Director for [facility] Pulmonary Rehabilitation.

## 2021-02-06 ENCOUNTER — Encounter: Payer: Self-pay | Admitting: Gastroenterology

## 2021-02-07 ENCOUNTER — Other Ambulatory Visit: Payer: Self-pay

## 2021-02-07 DIAGNOSIS — Z951 Presence of aortocoronary bypass graft: Secondary | ICD-10-CM

## 2021-02-07 NOTE — Progress Notes (Signed)
Daily Session Note  Patient Details  Name: Hayden Deleon MRN: [id number] Date of Birth: 10-02-1955 Referring Provider:   Flowsheet Row Cardiac Rehab from 12/28/2020 in [facility] Cardiac and Pulmonary Rehab  Referring Provider Fletcher Anon       Encounter Date: 02/07/2021  Check In:  Session Check In - 02/07/21 0711       Check-In   Supervising physician immediately available to respond to emergencies See telemetry face sheet for immediately available ER MD    Location [facility]-Cardiac & Pulmonary Rehab    Staff Present Birdie Sons, MPA, RN;Joseph [place], [place], [place], RCEP, CCRP, CCET    Virtual Visit No    Medication changes reported     No    Fall or balance concerns reported    No    Tobacco Cessation No Change    Warm-up and Cool-down Performed on first and last piece of equipment    Resistance Training Performed Yes    VAD Patient? No    PAD/SET Patient? No      Pain Assessment   Currently in Pain? No/denies                Social History   Tobacco Use  Smoking Status Former   Packs/day: 2.00   Years: 32.00   Pack years: 64.00   Types: Cigarettes   Quit date: 08/31/1999   Years since quitting: 21.4  Smokeless Tobacco Former    Goals Met:  Independence with exercise equipment Exercise tolerated well No report of concerns or symptoms today Strength training completed today  Goals Unmet:  Not Applicable  Comments: Pt able to follow exercise prescription today without complaint.  Will continue to monitor for progression.    Dr. Emily Filbert is Medical Director for [facility].  Dr. Ottie Glazier is Medical Director for [facility] Pulmonary Rehabilitation.

## 2021-02-09 ENCOUNTER — Other Ambulatory Visit: Payer: Self-pay

## 2021-02-09 ENCOUNTER — Telehealth: Payer: Self-pay | Admitting: Family Medicine

## 2021-02-09 ENCOUNTER — Encounter: Payer: BC Managed Care – PPO | Admitting: *Deleted

## 2021-02-09 DIAGNOSIS — Z951 Presence of aortocoronary bypass graft: Secondary | ICD-10-CM | POA: Diagnosis not present

## 2021-02-09 NOTE — Telephone Encounter (Signed)
What is the name of the medication? Hayden Deleon (ZOLOFT) 50 MG tablet [[id number]]   Have you contacted your pharmacy to request a refill?  I was rescheduling pt's cpe from 12/19 to 03/30/21. He let me know he will be out his script in one week. He is wanting a refill.   Which pharmacy would you like this sent to? [id number] [address], [place]  [place], [place] [address]  Phone:  [phone]  Fax:  [phone]  DEA #:  [id number]   Patient notified that their request is being sent to the clinical staff for review and that they should receive a call once it is complete. If they do not receive a call within 72 hours they can check with their pharmacy or our office.

## 2021-02-09 NOTE — Progress Notes (Signed)
Daily Session Note  Patient Details  Name: Hayden Deleon MRN: [id number] Date of Birth: 08/29/1955 Referring Provider:   Flowsheet Row Cardiac Rehab from 12/28/2020 in [facility] Cardiac and Pulmonary Rehab  Referring Provider Arida       Encounter Date: 02/09/2021  Check In:  Session Check In - 02/09/21 0803       Check-In   Supervising physician immediately available to respond to emergencies See telemetry face sheet for immediately available ER MD    Location [facility]-Cardiac & Pulmonary Rehab    Staff Present Susanne Bice, RN, BSN, CCRP;Laureen Brown, BS, RRT, CPFT;Joseph Hood, RCP,RRT,BSRT    Virtual Visit No    Medication changes reported     No    Fall or balance concerns reported    No    Warm-up and Cool-down Performed on first and last piece of equipment    Resistance Training Performed Yes    VAD Patient? No    PAD/SET Patient? No      Pain Assessment   Currently in Pain? No/denies                Social History   Tobacco Use  Smoking Status Former   Packs/day: 2.00   Years: 32.00   Pack years: 64.00   Types: Cigarettes   Quit date: 08/31/1999   Years since quitting: 21.4  Smokeless Tobacco Former    Goals Met:  Independence with exercise equipment Exercise tolerated well No report of concerns or symptoms today  Goals Unmet:  Not Applicable  Comments: Pt able to follow exercise prescription today without complaint.  Will continue to monitor for progression.    Dr. Mark Miller is Medical Director for [facility].  Dr. Fuad Aleskerov is Medical Director for [facility] Pulmonary Rehabilitation. 

## 2021-02-12 ENCOUNTER — Encounter: Payer: BC Managed Care – PPO | Admitting: *Deleted

## 2021-02-12 ENCOUNTER — Other Ambulatory Visit: Payer: Self-pay

## 2021-02-12 DIAGNOSIS — Z951 Presence of aortocoronary bypass graft: Secondary | ICD-10-CM | POA: Diagnosis not present

## 2021-02-12 NOTE — Progress Notes (Signed)
Daily Session Note  Patient Details  Name: Hayden Deleon MRN: [id number] Date of Birth: 1955/11/17 Referring Provider:   Flowsheet Row Cardiac Rehab from 12/28/2020 in [facility] Cardiac and Pulmonary Rehab  Referring Provider Fletcher Anon       Encounter Date: 02/12/2021  Check In:  Session Check In - 02/12/21 0805       Check-In   Supervising physician immediately available to respond to emergencies See telemetry face sheet for immediately available ER MD    Location [facility]-Cardiac & Pulmonary Rehab    Staff Present Heath Lark, RN, BSN, CCRP;Joseph [place], RCP,RRT,BSRT;Kelly [place], [place], ACSM CEP, Exercise Physiologist    Virtual Visit No    Medication changes reported     No    Fall or balance concerns reported    No    Warm-up and Cool-down Performed on first and last piece of equipment    Resistance Training Performed Yes    VAD Patient? No    PAD/SET Patient? No      Pain Assessment   Currently in Pain? No/denies                Social History   Tobacco Use  Smoking Status Former   Packs/day: 2.00   Years: 32.00   Pack years: 64.00   Types: Cigarettes   Quit date: 08/31/1999   Years since quitting: 21.4  Smokeless Tobacco Former    Goals Met:  Independence with exercise equipment Exercise tolerated well No report of concerns or symptoms today  Goals Unmet:  Not Applicable  Comments: Pt able to follow exercise prescription today without complaint.  Will continue to monitor for progression.    Dr. Emily Filbert is Medical Director for [place].  Dr. Ottie Glazier is Medical Director for [facility] Pulmonary Rehabilitation.

## 2021-02-14 ENCOUNTER — Other Ambulatory Visit: Payer: Self-pay

## 2021-02-14 DIAGNOSIS — Z951 Presence of aortocoronary bypass graft: Secondary | ICD-10-CM

## 2021-02-14 NOTE — Progress Notes (Signed)
Daily Session Note  Patient Details  Name: Hayden Deleon MRN: [id number] Date of Birth: [birth year] Referring Provider:   Flowsheet Row Cardiac Rehab from 12/28/2020 in [facility] Cardiac and Pulmonary Rehab  Referring Provider Fletcher Anon       Encounter Date: 02/14/2021  Check In:  Session Check In - 02/14/21 0713       Check-In   Supervising physician immediately available to respond to emergencies See telemetry face sheet for immediately available ER MD    Location [facility]-Cardiac & Pulmonary Rehab    Staff Present Birdie Sons, MPA, Elveria Rising, BA, ACSM CEP, Exercise Physiologist;Joseph Tessie Fass, [place]    Virtual Visit No    Medication changes reported     No    Fall or balance concerns reported    No    Tobacco Cessation No Change    Warm-up and Cool-down Performed on first and last piece of equipment    Resistance Training Performed Yes    VAD Patient? No    PAD/SET Patient? No      Pain Assessment   Currently in Pain? No/denies                Social History   Tobacco Use  Smoking Status Former   Packs/day: 2.00   Years: 32.00   Pack years: 64.00   Types: Cigarettes   Quit date: 08/31/1999   Years since quitting: 21.4  Smokeless Tobacco Former    Goals Met:  Independence with exercise equipment Exercise tolerated well No report of concerns or symptoms today Strength training completed today  Goals Unmet:  Not Applicable  Comments: Pt able to follow exercise prescription today without complaint.  Will continue to monitor for progression.    Dr. Emily Filbert is Medical Director for [place].  Dr. Ottie Glazier is Medical Director for [facility] Pulmonary Rehabilitation.

## 2021-02-16 ENCOUNTER — Other Ambulatory Visit: Payer: Self-pay

## 2021-02-16 ENCOUNTER — Encounter: Payer: BC Managed Care – PPO | Admitting: *Deleted

## 2021-02-16 DIAGNOSIS — Z951 Presence of aortocoronary bypass graft: Secondary | ICD-10-CM

## 2021-02-16 NOTE — Progress Notes (Signed)
Daily Session Note  Patient Details  Name: Hayden Deleon MRN: [id number] Date of Birth: [birth year] Referring Provider:   Flowsheet Row Cardiac Rehab from 12/28/2020 in [facility] Cardiac and Pulmonary Rehab  Referring Provider Fletcher Anon       Encounter Date: 02/16/2021  Check In:  Session Check In - 02/16/21 0808       Check-In   Supervising physician immediately available to respond to emergencies See telemetry face sheet for immediately available ER MD    Location [facility]-Cardiac & Pulmonary Rehab    Staff Present Heath Lark, RN, BSN, CCRP;Jessica [place], MA, RCEP, CCRP, CCET;Joseph [place], [place]    Virtual Visit No    Medication changes reported     No    Warm-up and Cool-down Performed on first and last piece of equipment    Resistance Training Performed Yes    VAD Patient? No    PAD/SET Patient? No      Pain Assessment   Currently in Pain? No/denies                Social History   Tobacco Use  Smoking Status Former   Packs/day: 2.00   Years: 32.00   Pack years: 64.00   Types: Cigarettes   Quit date: 08/31/1999   Years since quitting: 21.4  Smokeless Tobacco Former    Goals Met:  Independence with exercise equipment Exercise tolerated well No report of concerns or symptoms today  Goals Unmet:  Not Applicable  Comments: Pt able to follow exercise prescription today without complaint.  Will continue to monitor for progression.    Dr. Emily Filbert is Medical Director for [place].  Dr. Ottie Glazier is Medical Director for [facility] Pulmonary Rehabilitation.

## 2021-02-19 ENCOUNTER — Other Ambulatory Visit: Payer: Self-pay

## 2021-02-19 ENCOUNTER — Encounter: Payer: BC Managed Care – PPO | Admitting: *Deleted

## 2021-02-19 DIAGNOSIS — Z951 Presence of aortocoronary bypass graft: Secondary | ICD-10-CM

## 2021-02-19 NOTE — Progress Notes (Signed)
Daily Session Note  Patient Details  Name: Hayden Deleon MRN: [id number] Date of Birth: 01/16/1956 Referring Provider:   Flowsheet Row Cardiac Rehab from 12/28/2020 in [facility] Cardiac and Pulmonary Rehab  Referring Provider Arida       Encounter Date: 02/19/2021  Check In:  Session Check In - 02/19/21 0804       Check-In   Supervising physician immediately available to respond to emergencies See telemetry face sheet for immediately available ER MD    Location [facility]-Cardiac & Pulmonary Rehab    Staff Present Susanne Bice, RN, BSN, CCRP;Kelly Hayes, BS, ACSM CEP, Exercise Physiologist;Joseph Hood, RCP,RRT,BSRT    Virtual Visit No    Medication changes reported     No    Fall or balance concerns reported    No    Warm-up and Cool-down Performed on first and last piece of equipment    Resistance Training Performed Yes    VAD Patient? No    PAD/SET Patient? No      Pain Assessment   Currently in Pain? No/denies                Social History   Tobacco Use  Smoking Status Former   Packs/day: 2.00   Years: 32.00   Pack years: 64.00   Types: Cigarettes   Quit date: 08/31/1999   Years since quitting: 21.4  Smokeless Tobacco Former    Goals Met:  Independence with exercise equipment Exercise tolerated well No report of concerns or symptoms today  Goals Unmet:  Not Applicable  Comments: Pt able to follow exercise prescription today without complaint.  Will continue to monitor for progression.    Dr. Mark Miller is Medical Director for [facility].  Dr. Fuad Aleskerov is Medical Director for [facility] Pulmonary [facility]. 

## 2021-02-21 ENCOUNTER — Other Ambulatory Visit: Payer: Self-pay

## 2021-02-21 DIAGNOSIS — Z951 Presence of aortocoronary bypass graft: Secondary | ICD-10-CM

## 2021-02-21 NOTE — Progress Notes (Signed)
Daily Session Note  Patient Details  Name: Glade Strausser MRN: [id number] Date of Birth: 1955-09-05 Referring Provider:   Flowsheet Row Cardiac Rehab from 12/28/2020 in [facility] Cardiac and Pulmonary Rehab  Referring Provider Fletcher Anon       Encounter Date: 02/21/2021  Check In:  Session Check In - 02/21/21 0714       Check-In   Supervising physician immediately available to respond to emergencies See telemetry face sheet for immediately available ER MD    Location [facility]-Cardiac & Pulmonary Rehab    Staff Present Birdie Sons, MPA, Elveria Rising, BA, ACSM CEP, Exercise Physiologist;Joseph Tessie Fass, [place]    Virtual Visit No    Medication changes reported     No    Fall or balance concerns reported    No    Tobacco Cessation No Change    Warm-up and Cool-down Performed on first and last piece of equipment    Resistance Training Performed Yes    VAD Patient? No    PAD/SET Patient? No      Pain Assessment   Currently in Pain? No/denies                Social History   Tobacco Use  Smoking Status Former   Packs/day: 2.00   Years: 32.00   Pack years: 64.00   Types: Cigarettes   Quit date: 08/31/1999   Years since quitting: 21.4  Smokeless Tobacco Former    Goals Met:  Independence with exercise equipment Exercise tolerated well No report of concerns or symptoms today Strength training completed today  Goals Unmet:  Not Applicable  Comments: Pt able to follow exercise prescription today without complaint.  Will continue to monitor for progression.    Dr. Emily Filbert is Medical Director for [place].  Dr. Ottie Glazier is Medical Director for [facility] Pulmonary Rehabilitation.

## 2021-02-26 ENCOUNTER — Encounter: Payer: BC Managed Care – PPO | Admitting: *Deleted

## 2021-02-26 ENCOUNTER — Other Ambulatory Visit: Payer: Self-pay

## 2021-02-26 DIAGNOSIS — Z951 Presence of aortocoronary bypass graft: Secondary | ICD-10-CM

## 2021-02-26 NOTE — Progress Notes (Signed)
Daily Session Note  Patient Details  Name: Hayden Deleon MRN: [id number] Date of Birth: [birth year] Referring Provider:   Flowsheet Row Cardiac Rehab from 12/28/2020 in [facility] Cardiac and Pulmonary Rehab  Referring Provider Fletcher Anon       Encounter Date: 02/26/2021  Check In:  Session Check In - 02/26/21 0813       Check-In   Supervising physician immediately available to respond to emergencies See telemetry face sheet for immediately available ER MD    Location [facility]-Cardiac & Pulmonary Rehab    Staff Present Heath Lark, RN, BSN, Laveda Norman, BS, ACSM CEP, Exercise Physiologist;Joseph [place], [place]    Virtual Visit No    Medication changes reported     No    Fall or balance concerns reported    No    Warm-up and Cool-down Performed on first and last piece of equipment    Resistance Training Performed Yes    VAD Patient? No    PAD/SET Patient? No      Pain Assessment   Currently in Pain? No/denies                Social History   Tobacco Use  Smoking Status Former   Packs/day: 2.00   Years: 32.00   Pack years: 64.00   Types: Cigarettes   Quit date: 08/31/1999   Years since quitting: 21.5  Smokeless Tobacco Former    Goals Met:  Independence with exercise equipment Exercise tolerated well No report of concerns or symptoms today  Goals Unmet:  Not Applicable  Comments: Pt able to follow exercise prescription today without complaint.  Will continue to monitor for progression.    Dr. Emily Filbert is Medical Director for [place].  Dr. Ottie Glazier is Medical Director for [facility] Pulmonary Rehabilitation.

## 2021-02-27 ENCOUNTER — Other Ambulatory Visit: Payer: Self-pay

## 2021-02-27 ENCOUNTER — Ambulatory Visit (INDEPENDENT_AMBULATORY_CARE_PROVIDER_SITE_OTHER): Payer: BC Managed Care – PPO

## 2021-02-27 DIAGNOSIS — I6523 Occlusion and stenosis of bilateral carotid arteries: Secondary | ICD-10-CM | POA: Diagnosis not present

## 2021-02-28 ENCOUNTER — Encounter: Payer: Self-pay | Admitting: *Deleted

## 2021-02-28 VITALS — Ht 71.0 in | Wt 187.7 lb

## 2021-02-28 DIAGNOSIS — Z951 Presence of aortocoronary bypass graft: Secondary | ICD-10-CM | POA: Diagnosis not present

## 2021-02-28 NOTE — Progress Notes (Signed)
Cardiac Individual Treatment Plan  Patient Details  Name: Hayden Deleon MRN: [id number] Date of Birth: Oct 14, 1955 Referring Provider:   Flowsheet Row Cardiac Rehab from 12/28/2020 in [facility] Cardiac and Pulmonary Rehab  Referring Provider Arida       Initial Encounter Date:  Flowsheet Row Cardiac Rehab from 12/28/2020 in [facility] Cardiac and Pulmonary Rehab  Date 12/28/20       Visit Diagnosis: S/P CABG x 3  Patient's Home Medications on Admission:  Current Outpatient Medications:    albuterol (VENTOLIN HFA) 108 (90 Base) MCG/ACT inhaler, TAKE 2 PUFFS BY MOUTH EVERY 6 HOURS AS NEEDED FOR WHEEZE OR SHORTNESS OF BREATH, Disp: 6.7 each, Rfl: 10   aspirin EC 325 MG EC tablet, Take 1 tablet (325 mg total) by mouth daily., Disp: 30 tablet, Rfl: 0   atorvastatin (LIPITOR) 80 MG tablet, Take 1 tablet (80 mg total) by mouth daily., Disp: 30 tablet, Rfl: 2   Cyanocobalamin (B-12) 3000 MCG SUBL, Place 3,000 mcg under the tongue daily., Disp: , Rfl:    guaiFENesin (MUCINEX) 600 MG 12 hr tablet, Take 1 tablet (600 mg total) by mouth 2 (two) times daily., Disp: 30 tablet, Rfl: PRN   Multiple Vitamin (MULTIVITAMIN) capsule, Take 1 capsule by mouth daily., Disp: , Rfl:    sertraline (ZOLOFT) 50 MG tablet, TAKE 1 TABLET BY MOUTH EVERY DAY, Disp: 90 tablet, Rfl: 1   TRELEGY ELLIPTA 100-62.5-25 MCG/INH AEPB, TAKE 1 PUFF BY MOUTH EVERY DAY, Disp: 180 each, Rfl: 2  Past Medical History: Past Medical History:  Diagnosis Date   Allergy    Anxiety    Arthritis    right shoulder   CAD (coronary artery disease)    a. 2003 Cath: OM1 100%, good collats, RCA 60-70p-->med rx; b. 04/2020 MV: mild ischemia to sm area of mid inflat & apical lat segments. EF >65%; d. 10/2020 Cath: LM 85ost, LAD 30p/m, OM1 100, RCA 99p/m; e. 10/2020 CABG x 3: LIMA->LAD, VG->RPDA, VG->OM2.   Carotid artery disease (HCC)    a. 03/2020 Carotid U/S: RICA 1-39%, LICA 40-59%.   COPD (chronic obstructive pulmonary disease) (HCC)     Significant Dr. Delton Coombes   Diastolic dysfunction    a. 03/2018 Echo: EF 60-65%, no rwma, Gr1 DD. Mildly dil LA. Nl RV fxn.   Dilation of ascending aorta and aortic root (HCC)    a. 10/2020 Echo: Asc Ao 35mm.   Diverticulosis of colon    GERD (gastroesophageal reflux disease)    Gout    Hyperlipidemia    Hypertension    LVH (left ventricular hypertrophy)    a. 10/2020 Echo: EF 70-75%, no rwma, mod LVH. Nl RV size/fxn. Mildly dil RA. Triv MR. Asc Ao 70mm.   Prostatitis    Sleep apnea    uses cpap nightly    Tobacco Use: Social History   Tobacco Use  Smoking Status Former   Packs/day: 2.00   Years: 32.00   Pack years: 64.00   Types: Cigarettes   Quit date: 08/31/1999   Years since quitting: 21.5  Smokeless Tobacco Former    Labs: Recent [facility] for [facility] Cardiac and Pulmonary Rehab Latest Ref Rng & Units 11/05/2020 11/05/2020 11/06/2020 11/06/2020 11/07/2020   Cholestrol 0 - 200 mg/dL - - - - 85   LDLCALC 0 - 99 mg/dL - - - - 34   LDLDIRECT mg/dL - - - - -   HDL >87 mg/dL - - - - 69(J)  Trlycerides <150 mg/dL - - - - 150(H)   Hemoglobin A1c 4.8 - 5.6 % - - - - 5.6   PHART 7.350 - 7.450 7.194(LL) 7.234(L) 7.389 7.362 -   PCO2ART 32.0 - 48.0 mmHg 56.3(H) 50.1(H) 32.8 35.2 -   HCO3 20.0 - 28.0 mmol/L 21.8 21.0 19.8(L) 19.9(L) -   TCO2 22 - 32 mmol/L 23 22 21(L) 21(L) -   ACIDBASEDEF 0.0 - 2.0 mmol/L 6.0(H) 6.0(H) 5.0(H) 5.0(H) -   O2SAT % 92.0 87.0 95.0 89.0 -        Exercise Target Goals: Exercise Program Goal: Individual exercise prescription set using results from initial 6 min walk test and THRR while considering  patient's activity barriers and safety.   Exercise Prescription Goal: Initial exercise prescription builds to 30-45 minutes a day of aerobic activity, 2-3 days per week.  Home exercise guidelines will be given to patient during program as part of exercise prescription that the participant will acknowledge.   Education: Aerobic Exercise: -  Group verbal and visual presentation on the components of exercise prescription. Introduces F.I.T.T principle from ACSM for exercise prescriptions.  Reviews F.I.T.T. principles of aerobic exercise including progression. Written material given at graduation. Flowsheet Row Cardiac Rehab from 02/21/2021 in [facility] Cardiac and Pulmonary Rehab  Education need identified 12/28/20  Date 01/24/21  Educator [facility]  Instruction Review Code 1- Verbalizes Understanding       Education: Resistance Exercise: - Group verbal and visual presentation on the components of exercise prescription. Introduces F.I.T.T principle from ACSM for exercise prescriptions  Reviews F.I.T.T. principles of resistance exercise including progression. Written material given at graduation.    Education: Exercise & Equipment Safety: - Individual verbal instruction and demonstration of equipment use and safety with use of the equipment. Flowsheet Row Cardiac Rehab from 02/21/2021 in [facility] Cardiac and Pulmonary Rehab  Date 12/28/20  Educator AS  Instruction Review Code 1- Verbalizes Understanding       Education: Exercise Physiology & General Exercise Guidelines: - Group verbal and written instruction with models to review the exercise physiology of the cardiovascular system and associated critical values. Provides general exercise guidelines with specific guidelines to those with heart or lung disease.    Education: Flexibility, Balance, Mind/Body Relaxation: - Group verbal and visual presentation with interactive activity on the components of exercise prescription. Introduces F.I.T.T principle from ACSM for exercise prescriptions. Reviews F.I.T.T. principles of flexibility and balance exercise training including progression. Also discusses the mind body connection.  Reviews various relaxation techniques to help reduce and manage stress (i.e. Deep breathing, progressive muscle relaxation, and visualization). Balance handout provided to  take home. Written material given at graduation. Flowsheet Row Cardiac Rehab from 02/21/2021 in [facility] Cardiac and Pulmonary Rehab  Date 02/07/21  Educator AS  Instruction Review Code 1- Verbalizes Understanding       Activity Barriers & Risk Stratification:  Activity Barriers & Cardiac Risk Stratification - 12/22/20 1004       Activity Barriers & Cardiac Risk Stratification   Activity Barriers Other (comment)    Comments COPD; history of shoulder replacement (doesn't cause issues)    Cardiac Risk Stratification High             6 Minute Walk:  6 Minute Walk     Row Name 12/28/20 1525         6 Minute Walk   Phase Initial     Distance 1115 feet     Walk Time 6 minutes     #  of Rest Breaks 0     MPH 2.1     METS 3     RPE 11     Perceived Dyspnea  0     VO2 Peak 10.5     Symptoms No     Resting HR 81 bpm     Resting BP 138/66     Resting Oxygen Saturation  95 %     Exercise Oxygen Saturation  during 6 min walk 91 %     Max Ex. HR 97 bpm     Max Ex. BP 160/72     2 Minute Post BP 130/66              Oxygen Initial Assessment:   Oxygen Re-Evaluation:   Oxygen Discharge (Final Oxygen Re-Evaluation):   Initial Exercise Prescription:  Initial Exercise Prescription - 12/28/20 1500       Date of Initial Exercise RX and Referring Provider   Date 12/28/20    Referring Provider Arida      Treadmill   MPH 2    Grade 1.5    Minutes 15    METs 3      NuStep   Level 3    SPM 80    Minutes 15    METs 3      Elliptical   Level 1    Speed 2.5    Minutes 15    METs 3      REL-XR   Level 3    Speed 50    Minutes 15    METs 3      T5 Nustep   Level 2    SPM 80    Minutes 15    METs 3      Prescription Details   Frequency (times per week) 3    Duration Progress to 30 minutes of continuous aerobic without signs/symptoms of physical distress      Intensity   THRR 40-80% of Max Heartrate 110-140    Ratings of Perceived Exertion 11-15     Perceived Dyspnea 0-4      Resistance Training   Training Prescription Yes    Weight 3 lb    Reps 10-15             Perform Capillary Blood Glucose checks as needed.  Exercise Prescription Changes:   Exercise Prescription Changes     Row Name 12/28/20 1500 01/01/21 1500 01/17/21 1500 01/29/21 1000 01/31/21 0700     Response to Exercise   Blood Pressure (Admit) 138/66 130/76 142/72 110/62 --   Blood Pressure (Exercise) 160/72 142/58 158/70 -- --   Blood Pressure (Exit) 130/66 124/76 108/66 102/69 --   Heart Rate (Admit) 81 bpm 84 bpm 80 bpm 94 bpm --   Heart Rate (Exercise) 97 bpm 110 bpm 115 bpm 104 bpm --   Heart Rate (Exit) 87 bpm 96 bpm 100 bpm 86 bpm --   Oxygen Saturation (Admit) 95 % -- -- -- --   Oxygen Saturation (Exercise) 91 % -- -- -- --   Rating of Perceived Exertion (Exercise) 11 11 12 12  --   Perceived Dyspnea (Exercise) 0 -- -- -- --   Symptoms none none none none --   Comments -- first full day of exercise -- -- --   Duration -- Progress to 30 minutes of  aerobic without signs/symptoms of physical distress Continue with 30 min of aerobic exercise without signs/symptoms of physical distress. Continue with 30 min of aerobic  exercise without signs/symptoms of physical distress. --   Intensity -- THRR unchanged THRR unchanged THRR unchanged --     Progression   Progression -- Continue to progress workloads to maintain intensity without signs/symptoms of physical distress. Continue to progress workloads to maintain intensity without signs/symptoms of physical distress. Continue to progress workloads to maintain intensity without signs/symptoms of physical distress. --   Average METs -- 2.9 2.87 3.12 --     Resistance Training   Training Prescription -- Yes Yes Yes --   Weight -- 3 lb 3 lb 5 lb --   Reps -- 10-15 10-15 10-15 --     Interval Training   Interval Training -- No No No --     Treadmill   MPH -- 2 2 2.5 --   Grade -- 1.5 2 1.5 --   Minutes  -- $R'15 15 15 'XH$ --   METs -- 2.94 3.14 3.43 --     NuStep   Level -- 3 -- -- --   Minutes -- 15 -- -- --   METs -- 3 -- -- --     REL-XR   Level -- -- -- 4 --   Minutes -- -- -- 15 --     T5 Nustep   Level -- -- 3 4 --   Minutes -- -- 15 15 --   METs -- -- 2.6 3 --     Home Exercise Plan   Plans to continue exercise at -- -- -- -- Home (comment)  walking, treadmill, weights   Frequency -- -- -- -- Add 2 additional days to program exercise sessions.   Initial Home Exercises Provided -- -- -- -- 01/31/21     Oxygen   Maintain Oxygen Saturation -- 88% or higher 88% or higher 88% or higher --    Row Name 02/13/21 1500 02/27/21 1400           Response to Exercise   Blood Pressure (Admit) 112/62 120/80      Blood Pressure (Exit) 106/62 110/70      Heart Rate (Admit) 71 bpm 73 bpm      Heart Rate (Exercise) 92 bpm 91 bpm      Heart Rate (Exit) 89 bpm 81 bpm      Rating of Perceived Exertion (Exercise) 11 11      Symptoms none none      Duration Continue with 30 min of aerobic exercise without signs/symptoms of physical distress. Continue with 30 min of aerobic exercise without signs/symptoms of physical distress.      Intensity THRR unchanged THRR unchanged        Progression   Progression Continue to progress workloads to maintain intensity without signs/symptoms of physical distress. Continue to progress workloads to maintain intensity without signs/symptoms of physical distress.      Average METs 3.27 3.67        Resistance Training   Training Prescription Yes Yes      Weight 5 lb 6 lb      Reps 10-15 10-15        Interval Training   Interval Training No No        Treadmill   MPH 2.3 2.8      Grade 1.5 1      Minutes 15 15      METs 3.23 3.53        NuStep   Level 4 4      Minutes 15 15  METs -- 5        T5 Nustep   Level -- 4      Minutes -- 15      METs -- 2.5        Home Exercise Plan   Plans to continue exercise at Home (comment)  walking,  treadmill, weights Home (comment)  walking, treadmill, weights      Frequency Add 2 additional days to program exercise sessions. Add 2 additional days to program exercise sessions.      Initial Home Exercises Provided 01/31/21 01/31/21        Oxygen   Maintain Oxygen Saturation -- 88% or higher               Exercise Comments:   Exercise Comments     Row Name 01/01/21 0733           Exercise Comments First full day of exercise!  Patient was oriented to gym and equipment including functions, settings, policies, and procedures.  Patient's individual exercise prescription and treatment plan were reviewed.  All starting workloads were established based on the results of the 6 minute walk test done at initial orientation visit.  The plan for exercise progression was also introduced and progression will be customized based on patient's performance and goals.                Exercise Goals and Review:   Exercise Goals     Row Name 12/28/20 1528             Exercise Goals   Increase Physical Activity Yes       Intervention Provide advice, education, support and counseling about physical activity/exercise needs.;Develop an individualized exercise prescription for aerobic and resistive training based on initial evaluation findings, risk stratification, comorbidities and participant's personal goals.       Expected Outcomes Short Term: Attend rehab on a regular basis to increase amount of physical activity.;Long Term: Add in home exercise to make exercise part of routine and to increase amount of physical activity.;Long Term: Exercising regularly at least 3-5 days a week.       Increase Strength and Stamina Yes       Intervention Provide advice, education, support and counseling about physical activity/exercise needs.;Develop an individualized exercise prescription for aerobic and resistive training based on initial evaluation findings, risk stratification, comorbidities and  participant's personal goals.       Expected Outcomes Short Term: Increase workloads from initial exercise prescription for resistance, speed, and METs.;Short Term: Perform resistance training exercises routinely during rehab and add in resistance training at home;Long Term: Improve cardiorespiratory fitness, muscular endurance and strength as measured by increased METs and functional capacity (6MWT)       Able to understand and use rate of perceived exertion (RPE) scale Yes       Intervention Provide education and explanation on how to use RPE scale       Expected Outcomes Short Term: Able to use RPE daily in rehab to express subjective intensity level;Long Term:  Able to use RPE to guide intensity level when exercising independently       Able to understand and use Dyspnea scale Yes       Intervention Provide education and explanation on how to use Dyspnea scale       Expected Outcomes Short Term: Able to use Dyspnea scale daily in rehab to express subjective sense of shortness of breath during exertion;Long Term: Able to use Dyspnea scale to  guide intensity level when exercising independently       Knowledge and understanding of Target Heart Rate Range (THRR) Yes       Intervention Provide education and explanation of THRR including how the numbers were predicted and where they are located for reference       Expected Outcomes Short Term: Able to state/look up THRR;Short Term: Able to use daily as guideline for intensity in rehab;Long Term: Able to use THRR to govern intensity when exercising independently       Able to check pulse independently Yes       Intervention Provide education and demonstration on how to check pulse in carotid and radial arteries.;Review the importance of being able to check your own pulse for safety during independent exercise       Expected Outcomes Short Term: Able to explain why pulse checking is important during independent exercise;Long Term: Able to check pulse  independently and accurately       Understanding of Exercise Prescription Yes       Intervention Provide education, explanation, and written materials on patient's individual exercise prescription       Expected Outcomes Short Term: Able to explain program exercise prescription;Long Term: Able to explain home exercise prescription to exercise independently                Exercise Goals Re-Evaluation :  Exercise Goals Re-Evaluation     Row Name 01/01/21 0733 01/17/21 1600 01/29/21 1158 01/31/21 0742 02/07/21 0742     Exercise Goal Re-Evaluation   Exercise Goals Review Increase Physical Activity;Able to understand and use rate of perceived exertion (RPE) scale;Understanding of Exercise Prescription;Knowledge and understanding of Target Heart Rate Range (THRR);Increase Strength and Stamina;Able to understand and use Dyspnea scale;Able to check pulse independently Increase Physical Activity;Increase Strength and Stamina Increase Physical Activity;Increase Strength and Stamina Increase Physical Activity;Increase Strength and Stamina;Able to understand and use rate of perceived exertion (RPE) scale;Able to understand and use Dyspnea scale;Knowledge and understanding of Target Heart Rate Range (THRR);Able to check pulse independently;Understanding of Exercise Prescription Increase Physical Activity;Increase Strength and Stamina;Able to understand and use rate of perceived exertion (RPE) scale;Able to understand and use Dyspnea scale;Knowledge and understanding of Target Heart Rate Range (THRR);Able to check pulse independently;Understanding of Exercise Prescription   Comments Reviewed RPE and dyspnea scales, THR and program prescription with pt today.  Pt voiced understanding and was given a copy of goals to take home. Ray is progressing well and has increased levels on TM and T5.  Staff will encourage increasing to 4 or 5 lb for strength work Jailon is doing well in rehab. He did increase his handweights  to 5 lbs! He also increased his workload on the treadmill and increased his speed to 2.5. RPEs are in appropriate range. Will continue to monitor. Reviewed home exercise with pt today.  Pt plans to walk and use treadmill at home for exercise.  He also has a total gym and free weights that he can use.  Reviewed THR, pulse, RPE, sign and symptoms, pulse oximetery and when to call 911 or MD.  Also discussed weather considerations and indoor options.  Pt voiced understanding. He exercises most days when not at rehab. He has a treadmill at home, which he will sometimes put at random. He will workout for 30 minutes at home either on the treadmill or his seated machine. He is working on fixing his bike. Reviewed THRR 110-140.   Expected Outcomes Short: Use RPE daily  to regulate intensity. Long: Follow program prescription in THR. Short: try 4 lb Long : build overall stamina Short: Continue to increase loads on treadmill as tolerated Long: Continue to increase overall MET level Short: Start to add in treadmill at home Long: Continue to improve stamina Short: continue with home exercise, try to reach [facility] Long: Continue to improve stamina    Row Name 02/13/21 1505 02/27/21 1403           Exercise Goal Re-Evaluation   Exercise Goals Review Increase Physical Activity;Increase Strength and Stamina Increase Physical Activity;Increase Strength and Stamina;Understanding of Exercise Prescription      Comments Ray attends consistently and works at [employer].  He uses 6 lb for strength work.  We will continue to monitor progress. Ray is doing well in rehab.  He is due for this walk test this week.  We expect to see a good improvement in his post walk test.   He is up to 5 METs on the NuStep.  We will continue to monitor his progress.      Expected Outcomes Short: maintain consistent attendance and try to reach THR Long: increase average MET level Short: Improve post 6MWT Long: Conitnue to improve stamina                Discharge Exercise Prescription (Final Exercise Prescription Changes):  Exercise Prescription Changes - 02/27/21 1400       Response to Exercise   Blood Pressure (Admit) 120/80    Blood Pressure (Exit) 110/70    Heart Rate (Admit) 73 bpm    Heart Rate (Exercise) 91 bpm    Heart Rate (Exit) 81 bpm    Rating of Perceived Exertion (Exercise) 11    Symptoms none    Duration Continue with 30 min of aerobic exercise without signs/symptoms of physical distress.    Intensity THRR unchanged      Progression   Progression Continue to progress workloads to maintain intensity without signs/symptoms of physical distress.    Average METs 3.67      Resistance Training   Training Prescription Yes    Weight 6 lb    Reps 10-15      Interval Training   Interval Training No      Treadmill   MPH 2.8    Grade 1    Minutes 15    METs 3.53      NuStep   Level 4    Minutes 15    METs 5      T5 Nustep   Level 4    Minutes 15    METs 2.5      Home Exercise Plan   Plans to continue exercise at Home (comment)   walking, treadmill, weights   Frequency Add 2 additional days to program exercise sessions.    Initial Home Exercises Provided 01/31/21      Oxygen   Maintain Oxygen Saturation 88% or higher             Nutrition:  Target Goals: Understanding of nutrition guidelines, daily intake of sodium '1500mg'$ , cholesterol '200mg'$ , calories 30% from fat and 7% or less from saturated fats, daily to have 5 or more servings of fruits and vegetables.  Education: All About Nutrition: -Group instruction provided by verbal, written material, interactive activities, discussions, models, and posters to present general guidelines for heart healthy nutrition including fat, fiber, MyPlate, the role of sodium in heart healthy nutrition, utilization of the nutrition label, and utilization of this  knowledge for meal planning. Follow up email sent as well. Written material given at  graduation. Flowsheet Row Cardiac Rehab from 02/21/2021 in [facility] Cardiac and Pulmonary Rehab  Education need identified 12/28/20  Date 02/14/21  Educator [place]  Instruction Review Code 1- Verbalizes Understanding       Biometrics:  Pre Biometrics - 12/28/20 1530       Pre Biometrics   Height $Remov'5\' 11"'JoyFle$  (1.803 m)    Weight 196 lb 4.8 oz (89 kg)    BMI (Calculated) 27.39    Single Leg Stand 30 seconds              Nutrition Therapy Plan and Nutrition Goals:  Nutrition Therapy & Goals - 01/08/21 1501       Nutrition Therapy   Diet Heart healthy, low Na    Drug/Food Interactions Statins/Certain Fruits    Fiber 30 grams    Whole Grain Foods 3 servings    Saturated Fats 12 max. grams    Fruits and Vegetables 8 servings/day    Sodium 1.5 grams      Personal Nutrition Goals   Nutrition Goal ST: swap out grits for oatmeal 2-3x/week, add eggs or nuts and seeds to breakfast/ mid morning snack, try out recipes to sneak in vegetables LT: maintain current changes made    Comments Today B: grits (no butter, no salt) S: 4 small club crackers L: healthy choice ([place] with mashed potatoes and vegetables) with diet soda S: poptart D: baked chicken and vegetables (variety of non-starchy vegetables, he does not like greens) and sometimes corn. He alters the recipes he had; such as no salt added tomato sauce to his chili. He is not very big on vegetables. Usually drinks water. When he goes out to eat will split the portions. He has stopped eating fast food and honey buns. He loves cooking. He works in [employer] where he gets free bread - white wheat. Discussed heart healthy eating.      Intervention Plan   Intervention Prescribe, educate and counsel regarding individualized specific dietary modifications aiming towards targeted core components such as weight, hypertension, lipid management, diabetes, heart failure and other comorbidities.;Nutrition handout(s) given to patient.    Expected Outcomes  Short Term Goal: Understand basic principles of dietary content, such as calories, fat, sodium, cholesterol and nutrients.;Short Term Goal: A plan has been developed with personal nutrition goals set during dietitian appointment.;Long Term Goal: Adherence to prescribed nutrition plan.             Nutrition Assessments:  MEDIFICTS Score Key: ?70 Need to make dietary changes  40-70 Heart Healthy Diet ? 40 Therapeutic Level Cholesterol Diet  Flowsheet Row Cardiac Rehab from 12/28/2020 in [facility] Cardiac and Pulmonary Rehab  Picture Your Plate Total Score on Admission 74      Picture Your Plate Scores: <86 Unhealthy dietary pattern with much room for improvement. 41-50 Dietary pattern unlikely to meet recommendations for good health and room for improvement. 51-60 More healthful dietary pattern, with some room for improvement.  >60 Healthy dietary pattern, although there may be some specific behaviors that could be improved.    Nutrition Goals Re-Evaluation:  Nutrition Goals Re-Evaluation     [place] Name 02/07/21 0735             Goals   Current Weight 182 lb 8 oz (82.8 kg)       Nutrition Goal ST:  try out recipes to sneak in vegetables, try some other snacks (hummus  with vegetables, dried fruit and nuts, popcorn - no butter, low fat cheese and fruit) LT: maintain current changes made       Comment He reports working on adding in more vegetables - he reports reading some of the material I provided him and wants to get a food processor as he feels it would be easier - suggested starting simple and adding additional vegetables into sauces or stews he already eats. He reports eating more oatmeal "so long as it's fixed right". He has added unsalted cashew and penauts to his mid-morning snack. He is having boiled or stewed chicken. He has continues with most of his current changes. he continues to count calries and sodium intake. He snacks on a couple of graham crackers and club crackers.        Expected Outcome ST:  try out recipes to sneak in vegetables, try some other snacks (hummus with vegetables, dried fruit and nuts, popcorn - no butter, low fat cheese and fruit) LT: maintain current changes made                Nutrition Goals Discharge (Final Nutrition Goals Re-Evaluation):  Nutrition Goals Re-Evaluation - 02/07/21 0735       Goals   Current Weight 182 lb 8 oz (82.8 kg)    Nutrition Goal ST:  try out recipes to sneak in vegetables, try some other snacks (hummus with vegetables, dried fruit and nuts, popcorn - no butter, low fat cheese and fruit) LT: maintain current changes made    Comment He reports working on adding in more vegetables - he reports reading some of the material I provided him and wants to get a food processor as he feels it would be easier - suggested starting simple and adding additional vegetables into sauces or stews he already eats. He reports eating more oatmeal "so long as it's fixed right". He has added unsalted cashew and penauts to his mid-morning snack. He is having boiled or stewed chicken. He has continues with most of his current changes. he continues to count calries and sodium intake. He snacks on a couple of graham crackers and club crackers.    Expected Outcome ST:  try out recipes to sneak in vegetables, try some other snacks (hummus with vegetables, dried fruit and nuts, popcorn - no butter, low fat cheese and fruit) LT: maintain current changes made             Psychosocial: Target Goals: Acknowledge presence or absence of significant depression and/or stress, maximize coping skills, provide positive support system. Participant is able to verbalize types and ability to use techniques and skills needed for reducing stress and depression.   Education: Stress, Anxiety, and Depression - Group verbal and visual presentation to define topics covered.  Reviews how body is impacted by stress, anxiety, and depression.  Also discusses healthy  ways to reduce stress and to treat/manage anxiety and depression.  Written material given at graduation.   Education: Sleep Hygiene -Provides group verbal and written instruction about how sleep can affect your health.  Define sleep hygiene, discuss sleep cycles and impact of sleep habits. Review good sleep hygiene tips.    Initial Review & Psychosocial Screening:  Initial Psych Review & Screening - 12/22/20 1007       Initial Review   Current issues with None Identified      Family Dynamics   Good Support System? Yes   wife, mother in Sports coach, friends, coworkers     Barriers  Psychosocial barriers to participate in program There are no identifiable barriers or psychosocial needs.      Screening Interventions   Interventions Encouraged to exercise;To provide support and resources with identified psychosocial needs;Provide feedback about the scores to participant    Expected Outcomes Short Term goal: Utilizing psychosocial counselor, staff and physician to assist with identification of specific Stressors or current issues interfering with healing process. Setting desired goal for each stressor or current issue identified.;Long Term Goal: Stressors or current issues are controlled or eliminated.;Long Term goal: The participant improves quality of Life and PHQ9 Scores as seen by post scores and/or verbalization of changes;Short Term goal: Identification and review with participant of any Quality of Life or Depression concerns found by scoring the questionnaire.             Quality of Life Scores:   Quality of Life - 12/28/20 1521       Quality of Life   Select Quality of Life      Quality of Life Scores   Health/Function Pre 27.86 %    Socioeconomic Pre 25.5 %    Psych/Spiritual Pre 30 %    Family Pre 25.8 %    GLOBAL Pre 27.86 %            Scores of 19 and below usually indicate a poorer quality of life in these areas.  A difference of  2-3 points is a clinically  meaningful difference.  A difference of 2-3 points in the total score of the Quality of Life Index has been associated with significant improvement in overall quality of life, self-image, physical symptoms, and general health in studies assessing change in quality of life.  PHQ-9: Recent Review Flowsheet Data     Depression screen [facility] 2/9 12/28/2020 12/21/2018 04/02/2017   Decreased Interest 0 0 0   Down, Depressed, Hopeless 0 0 0   PHQ - 2 Score 0 0 0   Altered sleeping 0 - -   Tired, decreased energy 1 - -   Change in appetite 0 - -   Feeling bad or failure about yourself  0 - -   Trouble concentrating 0 - -   Moving slowly or fidgety/restless 0 - -   Suicidal thoughts 0 - -   PHQ-9 Score 1 - -   Difficult doing work/chores Not difficult at all - -      Interpretation of Total Score  Total Score Depression Severity:  1-4 = Minimal depression, 5-9 = Mild depression, 10-14 = Moderate depression, 15-19 = Moderately severe depression, 20-27 = Severe depression   Psychosocial Evaluation and Intervention:  Psychosocial Evaluation - 12/22/20 1027       Psychosocial Evaluation & Interventions   Interventions Encouraged to exercise with the program and follow exercise prescription    Comments Ray reports feeling quite well post CABG. He has noted his COPD symptoms feel better as well. He has been walking a few miles a week and is back to work part time. 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 His wife, mother in law, friends and coworkers have been a great support system during his recovery. He and his wife have been making a lot of diet changes since surgery and he has lost 23 lbs since being admitted for CABG. He feels a lot better and is motivated to keep making heart healthy choices.    Expected  Outcomes Short: attend cardiac rehab for education and exercise. Long: develop and maintian positive self care  habits.    Continue Psychosocial Services  Follow up required by staff             Psychosocial Re-Evaluation:  Psychosocial Re-Evaluation     [place] Name 01/24/21 [phone] 02/07/21 0744           Psychosocial Re-Evaluation   Comments Patient reports no issues with their current mental states, sleep, stress, depression or anxiety. Will follow up with patient in a few weeks for any changes. Ray reports no stressors, he is looking foward to Thanksgiving and Christmas. He reports being level headed and staying positive. He rpeorts sleeping better since his surgery. He likes to relax at home and will play some games on his phone. His support system includes his wife and friends. One of his friends is getting surgery for his heart and he wants to connect with him.      Expected Outcomes Short: Continue to exercise regularly to support mental health and notify staff of any changes. Long: maintain mental health and well being through teaching of rehab or prescribed medications independently. Short: Continue to exercise regularly to support mental health and notify staff of any changes. Long: maintain mental health and well being through teaching of rehab or prescribed medications independently.      Interventions Encouraged to attend Cardiac Rehabilitation for the exercise Encouraged to attend Cardiac Rehabilitation for the exercise      Continue Psychosocial Services  Follow up required by staff Follow up required by staff               Psychosocial Discharge (Final Psychosocial Re-Evaluation):  Psychosocial Re-Evaluation - 02/07/21 0744       Psychosocial Re-Evaluation   Comments Ray reports no stressors, he is looking foward to Thanksgiving and Christmas. He reports being level headed and staying positive. He rpeorts sleeping better since his surgery. He likes to relax at home and will play some games on his phone. His support system includes his wife and friends. One of his friends is getting  surgery for his heart and he wants to connect with him.    Expected Outcomes Short: Continue to exercise regularly to support mental health and notify staff of any changes. Long: maintain mental health and well being through teaching of rehab or prescribed medications independently.    Interventions Encouraged to attend Cardiac Rehabilitation for the exercise    Continue Psychosocial Services  Follow up required by staff             Vocational Rehabilitation: Provide vocational rehab assistance to qualifying candidates.   Vocational Rehab Evaluation & Intervention:  Vocational Rehab - 12/22/20 1006       Initial Vocational Rehab Evaluation & Intervention   Assessment shows need for Vocational Rehabilitation No             Education: Education Goals: Education classes will be provided on a variety of topics geared toward better understanding of heart health and risk factor modification. Participant will state understanding/return demonstration of topics presented as noted by education test scores.  Learning Barriers/Preferences:  Learning Barriers/Preferences - 12/22/20 1006       Learning Barriers/Preferences   Learning Barriers None             General Cardiac Education Topics:  AED/CPR: - Group verbal and written instruction with the use of models to demonstrate the basic use of the AED with the  basic ABC's of resuscitation.   Anatomy and Cardiac Procedures: - Group verbal and visual presentation and models provide information about basic cardiac anatomy and function. Reviews the testing methods done to diagnose heart disease and the outcomes of the test results. Describes the treatment choices: Medical Management, Angioplasty, or Coronary Bypass Surgery for treating various heart conditions including Myocardial Infarction, Angina, Valve Disease, and Cardiac Arrhythmias.  Written material given at graduation. Flowsheet Row Cardiac Rehab from 02/21/2021 in [facility]  Cardiac and Pulmonary Rehab  Education need identified 12/28/20       Medication Safety: - Group verbal and visual instruction to review commonly prescribed medications for heart and lung disease. Reviews the medication, class of the drug, and side effects. Includes the steps to properly store meds and maintain the prescription regimen.  Written material given at graduation. Flowsheet Row Cardiac Rehab from 02/21/2021 in [facility] Cardiac and Pulmonary Rehab  Date 02/21/21  Educator SB  Instruction Review Code 1- Verbalizes Understanding       Intimacy: - Group verbal instruction through game format to discuss how heart and lung disease can affect sexual intimacy. Written material given at graduation.. Flowsheet Row Cardiac Rehab from 02/21/2021 in [facility] Cardiac and Pulmonary Rehab  Date 01/24/21  Educator [facility]  Instruction Review Code 1- Verbalizes Understanding       Know Your Numbers and Heart Failure: - Group verbal and visual instruction to discuss disease risk factors for cardiac and pulmonary disease and treatment options.  Reviews associated critical values for Overweight/Obesity, Hypertension, Cholesterol, and Diabetes.  Discusses basics of heart failure: signs/symptoms and treatments.  Introduces Heart Failure Zone chart for action plan for heart failure.  Written material given at graduation.   Infection Prevention: - Provides verbal and written material to individual with discussion of infection control including proper hand washing and proper equipment cleaning during exercise session. Flowsheet Row Cardiac Rehab from 02/21/2021 in [facility] Cardiac and Pulmonary Rehab  Date 12/28/20  Educator AS  Instruction Review Code 1- Verbalizes Understanding       Falls Prevention: - Provides verbal and written material to individual with discussion of falls prevention and safety. Flowsheet Row Cardiac Rehab from 02/21/2021 in [facility] Cardiac and Pulmonary Rehab  Date 12/28/20   Educator AS  Instruction Review Code 1- Verbalizes Understanding       Other: -Provides group and verbal instruction on various topics (see comments)   Knowledge Questionnaire Score:  Knowledge Questionnaire Score - 12/28/20 1521       Knowledge Questionnaire Score   Pre Score 22/26             Core Components/Risk Factors/Patient Goals at Admission:  Personal Goals and Risk Factors at Admission - 12/28/20 1530       Core Components/Risk Factors/Patient Goals on Admission   Hypertension Yes    Intervention Provide education on lifestyle modifcations including regular physical activity/exercise, weight management, moderate sodium restriction and increased consumption of fresh fruit, vegetables, and low fat dairy, alcohol moderation, and smoking cessation.;Monitor prescription use compliance.    Expected Outcomes Short Term: Continued assessment and intervention until BP is < 140/64mm HG in hypertensive participants. < 130/51mm HG in hypertensive participants with diabetes, heart failure or chronic kidney disease.;Long Term: Maintenance of blood pressure at goal levels.    Lipids Yes    Intervention Provide education and support for participant on nutrition & aerobic/resistive exercise along with prescribed medications to achieve LDL '70mg'$ , HDL >$Remo'40mg'PaOLk$ .    Expected Outcomes Short Term: Participant states  understanding of desired cholesterol values and is compliant with medications prescribed. Participant is following exercise prescription and nutrition guidelines.;Long Term: Cholesterol controlled with medications as prescribed, with individualized exercise RX and with personalized nutrition plan. Value goals: LDL < $Rem'70mg'lbVL$ , HDL > 40 mg.             Education:Diabetes - Individual verbal and written instruction to review signs/symptoms of diabetes, desired ranges of glucose level fasting, after meals and with exercise. Acknowledge that pre and post exercise glucose checks will be  done for 3 sessions at entry of program.   Core Components/Risk Factors/Patient Goals Review:   Goals and Risk Factor Review     [place] Name 01/24/21 0743 02/07/21 0733           Core Components/Risk Factors/Patient Goals Review   Personal Goals Review Hypertension Hypertension      Review Ray has been checking his b,oood pressure at home and routinely. He states that it has been on average 116's over 70's. Informed him that his is good to check his blood pressure at home and continue to do so. His blood pressure in the program has been doing well and has no questions about it. Ray reports not taking his BP at home right now, he needs to get a new one and then he will get back to it - in class today his resting BP was 128/60.      Expected Outcomes Short" check blood pressures at home. Long: maintain blood pressure reading at home independently ST: get new BP cuff LT: check BP at home routinely               Core Components/Risk Factors/Patient Goals at Discharge (Final Review):   Goals and Risk Factor Review - 02/07/21 0733       Core Components/Risk Factors/Patient Goals Review   Personal Goals Review Hypertension    Review Ray reports not taking his BP at home right now, he needs to get a new one and then he will get back to it - in class today his resting BP was 128/60.    Expected Outcomes ST: get new BP cuff LT: check BP at home routinely             ITP Comments:  ITP Comments     Row Name 12/22/20 1025 12/28/20 1537 01/01/21 0733 01/03/21 0821 01/08/21 1533   ITP Comments Initial telephone orientation completed. Diagnosis can be found in [facility] 8/5. EP orientation scheduled for Thursday 9/29 at 2pm. Completed 6MWT and gym orientation. Initial ITP created and sent for review to Dr. Emily Filbert, Medical Director. First full day of exercise!  Patient was oriented to gym and equipment including functions, settings, policies, and procedures.  Patient's individual exercise  prescription and treatment plan were reviewed.  All starting workloads were established based on the results of the 6 minute walk test done at initial orientation visit.  The plan for exercise progression was also introduced and progression will be customized based on patient's performance and goals. 30 day review completed. ITP sent to Dr. Emily Filbert, Medical Director of Cardiac Rehab. Continue with ITP unless changes are made by physician. Completed initial RD consultation    [place] Name 01/31/21 0645 02/28/21 0640         ITP Comments 30 Day review completed. Medical Director ITP review done, changes made as directed, and signed approval by Medical Director. 30 Day review completed. Medical Director ITP review done, changes made as directed, and  signed approval by Medical Director.               Comments:

## 2021-02-28 NOTE — Progress Notes (Signed)
Daily Session Note  Patient Details  Name: Hayden Deleon MRN: [id number] Date of Birth: Mar 03, 1956 Referring Provider:   Flowsheet Row Cardiac Rehab from 12/28/2020 in [facility] Cardiac and Pulmonary Rehab  Referring Provider Fletcher Anon       Encounter Date: 02/28/2021  Check In:  Session Check In - 02/28/21 0713       Check-In   Supervising physician immediately available to respond to emergencies See telemetry face sheet for immediately available ER MD    Location [facility]-Cardiac & Pulmonary Rehab    Staff Present Birdie Sons, MPA, Elveria Rising, BA, ACSM CEP, Exercise Physiologist;Joseph Tessie Fass, [place]    Virtual Visit No    Medication changes reported     No    Fall or balance concerns reported    No    Tobacco Cessation No Change    Warm-up and Cool-down Performed on first and last piece of equipment    Resistance Training Performed Yes    VAD Patient? No    PAD/SET Patient? No      Pain Assessment   Currently in Pain? No/denies                Social History   Tobacco Use  Smoking Status Former   Packs/day: 2.00   Years: 32.00   Pack years: 64.00   Types: Cigarettes   Quit date: 08/31/1999   Years since quitting: 21.5  Smokeless Tobacco Former    Goals Met:  Independence with exercise equipment Exercise tolerated well No report of concerns or symptoms today Strength training completed today  Goals Unmet:  Not Applicable  Comments: Pt able to follow exercise prescription today without complaint.  Will continue to monitor for progression.    Dr. Emily Filbert is Medical Director for [place].  Dr. Ottie Glazier is Medical Director for [facility].

## 2021-03-01 ENCOUNTER — Telehealth: Payer: Self-pay | Admitting: *Deleted

## 2021-03-01 DIAGNOSIS — I6523 Occlusion and stenosis of bilateral carotid arteries: Secondary | ICD-10-CM

## 2021-03-01 NOTE — Telephone Encounter (Signed)
Left voicemail message to call back for review of results.  

## 2021-03-01 NOTE — Telephone Encounter (Signed)
Reviewed results and recommendations with patient. He verbalized understanding with no further questions at this time.  

## 2021-03-01 NOTE — Patient Instructions (Signed)
Discharge Patient Instructions  Patient Details  Name: Hayden Deleon MRN: [id number] Date of Birth: Sep 25, 1955 Referring Provider:  Wellington Hampshire, MD   Number of Visits: 72  Reason for Discharge:  Patient reached a stable level of exercise. Patient independent in their exercise. Patient has met program and personal goals.  Smoking History:  Social History   Tobacco Use  Smoking Status Former   Packs/day: 2.00   Years: 32.00   Pack years: 64.00   Types: Cigarettes   Quit date: 08/31/1999   Years since quitting: 21.5  Smokeless Tobacco Former    Diagnosis:  S/P CABG x 3  Initial Exercise Prescription:  Initial Exercise Prescription - 12/28/20 1500       Date of Initial Exercise RX and Referring Provider   Date 12/28/20    Referring Provider Arida      Treadmill   MPH 2    Grade 1.5    Minutes 15    METs 3      NuStep   Level 3    SPM 80    Minutes 15    METs 3      Elliptical   Level 1    Speed 2.5    Minutes 15    METs 3      REL-XR   Level 3    Speed 50    Minutes 15    METs 3      T5 Nustep   Level 2    SPM 80    Minutes 15    METs 3      Prescription Details   Frequency (times per week) 3    Duration Progress to 30 minutes of continuous aerobic without signs/symptoms of physical distress      Intensity   THRR 40-80% of Max Heartrate 110-140    Ratings of Perceived Exertion 11-15    Perceived Dyspnea 0-4      Resistance Training   Training Prescription Yes    Weight 3 lb    Reps 10-15             Discharge Exercise Prescription (Final Exercise Prescription Changes):  Exercise Prescription Changes - 02/27/21 1400       Response to Exercise   Blood Pressure (Admit) 120/80    Blood Pressure (Exit) 110/70    Heart Rate (Admit) 73 bpm    Heart Rate (Exercise) 91 bpm    Heart Rate (Exit) 81 bpm    Rating of Perceived Exertion (Exercise) 11    Symptoms none    Duration Continue with 30 min of aerobic exercise without  signs/symptoms of physical distress.    Intensity THRR unchanged      Progression   Progression Continue to progress workloads to maintain intensity without signs/symptoms of physical distress.    Average METs 3.67      Resistance Training   Training Prescription Yes    Weight 6 lb    Reps 10-15      Interval Training   Interval Training No      Treadmill   MPH 2.8    Grade 1    Minutes 15    METs 3.53      NuStep   Level 4    Minutes 15    METs 5      T5 Nustep   Level 4    Minutes 15    METs 2.5      Home Exercise Plan   Plans to  continue exercise at Home (comment)   walking, treadmill, weights   Frequency Add 2 additional days to program exercise sessions.    Initial Home Exercises Provided 01/31/21      Oxygen   Maintain Oxygen Saturation 88% or higher             Functional Capacity:  6 Minute Walk     Row Name 12/28/20 1525 02/28/21 0735       6 Minute Walk   Phase Initial Discharge    Distance 1115 feet 1405 feet    Distance % Change -- 26 %    Distance Feet Change -- 290 ft    Walk Time 6 minutes 6 minutes    # of Rest Breaks 0 0    MPH 2.1 2.67    METS 3 3.4    RPE 11 10    Perceived Dyspnea  0 0    VO2 Peak 10.5 11.99    Symptoms No No    Resting HR 81 bpm 68 bpm    Resting BP 138/66 114/62    Resting Oxygen Saturation  95 % 95 %    Exercise Oxygen Saturation  during 6 min walk 91 % 89 %    Max Ex. HR 97 bpm 90 bpm    Max Ex. BP 160/72 146/68    2 Minute Post BP 130/66 --               Nutrition & Weight - Outcomes:  Pre Biometrics - 12/28/20 1530       Pre Biometrics   Height $Remov'5\' 11"'cBWLSx$  (1.803 m)    Weight 196 lb 4.8 oz (89 kg)    BMI (Calculated) 27.39    Single Leg Stand 30 seconds             Post Biometrics - 02/28/21 0736        Post  Biometrics   Height $Remov'5\' 11"'RfvFXl$  (1.803 m)    Weight 187 lb 11.2 oz (85.1 kg)    BMI (Calculated) 26.19    Single Leg Stand 30 seconds             Nutrition:  Nutrition  Therapy & Goals - 01/08/21 1501       Nutrition Therapy   Diet Heart healthy, low Na    Drug/Food Interactions Statins/Certain Fruits    Fiber 30 grams    Whole Grain Foods 3 servings    Saturated Fats 12 max. grams    Fruits and Vegetables 8 servings/day    Sodium 1.5 grams      Personal Nutrition Goals   Nutrition Goal ST: swap out grits for oatmeal 2-3x/week, add eggs or nuts and seeds to breakfast/ mid morning snack, try out recipes to sneak in vegetables LT: maintain current changes made    Comments Today B: grits (no butter, no salt) S: 4 small club crackers L: healthy choice ([place] with mashed potatoes and vegetables) with diet soda S: poptart D: baked chicken and vegetables (variety of non-starchy vegetables, he does not like greens) and sometimes corn. He alters the recipes he had; such as no salt added tomato sauce to his chili. He is not very big on vegetables. Usually drinks water. When he goes out to eat will split the portions. He has stopped eating fast food and honey buns. He loves cooking. He works in [employer] where he gets free bread - white wheat. Discussed heart healthy eating.      Intervention Plan  Intervention Prescribe, educate and counsel regarding individualized specific dietary modifications aiming towards targeted core components such as weight, hypertension, lipid management, diabetes, heart failure and other comorbidities.;Nutrition handout(s) given to patient.    Expected Outcomes Short Term Goal: Understand basic principles of dietary content, such as calories, fat, sodium, cholesterol and nutrients.;Short Term Goal: A plan has been developed with personal nutrition goals set during dietitian appointment.;Long Term Goal: Adherence to prescribed nutrition plan.            Goals reviewed with patient; copy given to patient.

## 2021-03-01 NOTE — Telephone Encounter (Signed)
-----   Message from Theora Gianotti, NP sent at 02/28/2021  4:38 PM EST ----- Bilateral 40 to 59% stenoses in the internal carotid arteries.  This is stable on the left and slightly advanced on the right, compared to last year.  Plan follow-up study in 1 year.

## 2021-03-02 ENCOUNTER — Other Ambulatory Visit: Payer: Self-pay

## 2021-03-02 ENCOUNTER — Encounter: Payer: BC Managed Care – PPO | Attending: Cardiovascular Disease | Admitting: *Deleted

## 2021-03-02 DIAGNOSIS — Z951 Presence of aortocoronary bypass graft: Secondary | ICD-10-CM | POA: Insufficient documentation

## 2021-03-02 NOTE — Progress Notes (Signed)
Daily Session Note  Patient Details  Name: Hayden Deleon MRN: [id number] Date of Birth: [birth year] Referring Provider:   Flowsheet Row Cardiac Rehab from 12/28/2020 in [facility] Cardiac and Pulmonary Rehab  Referring Provider Fletcher Anon       Encounter Date: 03/02/2021  Check In:  Session Check In - 03/02/21 0805       Check-In   Supervising physician immediately available to respond to emergencies See telemetry face sheet for immediately available ER MD    Location [facility]-Cardiac & Pulmonary Rehab    Staff Present Heath Lark, RN, BSN, CCRP;Jessica Elsa, MA, RCEP, CCRP, CCET;Joseph Kila, [place]    Virtual Visit No    Medication changes reported     No    Fall or balance concerns reported    No    Warm-up and Cool-down Performed on first and last piece of equipment    Resistance Training Performed Yes    VAD Patient? No    PAD/SET Patient? No      Pain Assessment   Currently in Pain? No/denies                Social History   Tobacco Use  Smoking Status Former   Packs/day: 2.00   Years: 32.00   Pack years: 64.00   Types: Cigarettes   Quit date: 08/31/1999   Years since quitting: 21.5  Smokeless Tobacco Former    Goals Met:  Independence with exercise equipment Exercise tolerated well No report of concerns or symptoms today  Goals Unmet:  Not Applicable  Comments: Pt able to follow exercise prescription today without complaint.  Will continue to monitor for progression.    Dr. Emily Filbert is Medical Director for [place].  Dr. Ottie Glazier is Medical Director for [facility] Pulmonary Rehabilitation.

## 2021-03-03 ENCOUNTER — Other Ambulatory Visit: Payer: Self-pay | Admitting: Internal Medicine

## 2021-03-03 DIAGNOSIS — J449 Chronic obstructive pulmonary disease, unspecified: Secondary | ICD-10-CM

## 2021-03-05 ENCOUNTER — Encounter: Payer: BC Managed Care – PPO | Admitting: *Deleted

## 2021-03-05 ENCOUNTER — Other Ambulatory Visit: Payer: Self-pay

## 2021-03-05 DIAGNOSIS — Z951 Presence of aortocoronary bypass graft: Secondary | ICD-10-CM | POA: Diagnosis not present

## 2021-03-05 NOTE — Progress Notes (Signed)
Daily Session Note  Patient Details  Name: Hayden Deleon MRN: [id number] Date of Birth: 01-17-1956 Referring Provider:   Flowsheet Row Cardiac Rehab from 12/28/2020 in [facility] Cardiac and Pulmonary Rehab  Referring Provider Fletcher Anon       Encounter Date: 03/05/2021  Check In:  Session Check In - 03/05/21 0805       Check-In   Supervising physician immediately available to respond to emergencies See telemetry face sheet for immediately available ER MD    Location [facility]-Cardiac & Pulmonary Rehab    Staff Present Heath Lark, RN, BSN, Laveda Norman, BS, ACSM CEP, Exercise Physiologist;Joseph [place], RCP,RRT,BSRT;Jessica Hedrick, [place], RCEP, CCRP, CCET    Virtual Visit No    Medication changes reported     No    Fall or balance concerns reported    No    Warm-up and Cool-down Performed on first and last piece of equipment    Resistance Training Performed Yes    VAD Patient? No    PAD/SET Patient? No      Pain Assessment   Currently in Pain? No/denies                Social History   Tobacco Use  Smoking Status Former   Packs/day: 2.00   Years: 32.00   Pack years: 64.00   Types: Cigarettes   Quit date: 08/31/1999   Years since quitting: 21.5  Smokeless Tobacco Former    Goals Met:  Independence with exercise equipment Exercise tolerated well No report of concerns or symptoms today  Goals Unmet:  Not Applicable  Comments: Pt able to follow exercise prescription today without complaint.  Will continue to monitor for progression.    Dr. Emily Filbert is Medical Director for [place].  Dr. Ottie Glazier is Medical Director for [facility] Pulmonary Rehabilitation.

## 2021-03-07 ENCOUNTER — Other Ambulatory Visit: Payer: Self-pay

## 2021-03-07 DIAGNOSIS — Z951 Presence of aortocoronary bypass graft: Secondary | ICD-10-CM

## 2021-03-07 NOTE — Progress Notes (Signed)
Daily Session Note  Patient Details  Name: Hayden Deleon MRN: [id number] Date of Birth: 01/18/1956 Referring Provider:   Flowsheet Row Cardiac Rehab from 12/28/2020 in [facility] Cardiac and Pulmonary Rehab  Referring Provider Arida       Encounter Date: 03/07/2021  Check In:  Session Check In - 03/07/21 0713       Check-In   Supervising physician immediately available to respond to emergencies See telemetry face sheet for immediately available ER MD    Location [facility]-Cardiac & Pulmonary Rehab    Staff Present Melissa Caiola, RDN, LDN;Kelly Bollinger, MPA, RN;Amanda Sommer, BA, ACSM CEP, Exercise Physiologist    Virtual Visit No    Medication changes reported     No    Fall or balance concerns reported    No    Tobacco Cessation No Change    Warm-up and Cool-down Performed on first and last piece of equipment    Resistance Training Performed Yes    VAD Patient? No    PAD/SET Patient? No      Pain Assessment   Currently in Pain? No/denies                Social History   Tobacco Use  Smoking Status Former   Packs/day: 2.00   Years: 32.00   Pack years: 64.00   Types: Cigarettes   Quit date: 08/31/1999   Years since quitting: 21.5  Smokeless Tobacco Former    Goals Met:  Independence with exercise equipment Exercise tolerated well No report of concerns or symptoms today Strength training completed today  Goals Unmet:  Not Applicable  Comments: Pt able to follow exercise prescription today without complaint.  Will continue to monitor for progression.    Dr. Mark Miller is Medical Director for [facility].  Dr. Fuad Aleskerov is Medical Director for [facility] Pulmonary Rehabilitation. 

## 2021-03-09 ENCOUNTER — Encounter: Payer: BC Managed Care – PPO | Admitting: *Deleted

## 2021-03-09 ENCOUNTER — Other Ambulatory Visit: Payer: Self-pay

## 2021-03-09 ENCOUNTER — Other Ambulatory Visit: Payer: Self-pay | Admitting: Family Medicine

## 2021-03-09 DIAGNOSIS — Z951 Presence of aortocoronary bypass graft: Secondary | ICD-10-CM | POA: Diagnosis not present

## 2021-03-09 MED ORDER — SERTRALINE HCL 50 MG PO TABS: 50.0000 mg | ORAL_TABLET | Freq: Every day | ORAL | 1 refills | Status: DC

## 2021-03-09 NOTE — Telephone Encounter (Signed)
Refill request for pending Rx last OV 12/13/19 patient had virtual with Baldo Ash on 04/03/20 last refill 07/11/20. Patient has upcoming visit on 03/30/21. Please advise

## 2021-03-09 NOTE — Telephone Encounter (Signed)
Refill sent in pt aware

## 2021-03-09 NOTE — Telephone Encounter (Signed)
Pt requesting refill on Sertraline

## 2021-03-09 NOTE — Progress Notes (Signed)
Daily Session Note  Patient Details  Name: Hayden Deleon MRN: [id number] Date of Birth: 24-Dec-1955 Referring Provider:   Flowsheet Row Cardiac Rehab from 12/28/2020 in [facility] Cardiac and Pulmonary Rehab  Referring Provider Fletcher Anon       Encounter Date: 03/09/2021  Check In:  Session Check In - 03/09/21 0807       Check-In   Supervising physician immediately available to respond to emergencies See telemetry face sheet for immediately available ER MD    Location [facility]-Cardiac & Pulmonary Rehab    Staff Present Heath Lark, RN, BSN, CCRP;Joseph Kerkhoven, RCP,RRT,BSRT;Jessica [place], [place], [place], CCRP, CCET    Virtual Visit No    Medication changes reported     No    Fall or balance concerns reported    No    Warm-up and Cool-down Performed on first and last piece of equipment    Resistance Training Performed Yes    VAD Patient? No    PAD/SET Patient? No      Pain Assessment   Currently in Pain? No/denies                Social History   Tobacco Use  Smoking Status Former   Packs/day: 2.00   Years: 32.00   Pack years: 64.00   Types: Cigarettes   Quit date: 08/31/1999   Years since quitting: 21.5  Smokeless Tobacco Former    Goals Met:  Independence with exercise equipment Exercise tolerated well No report of concerns or symptoms today  Goals Unmet:  Not Applicable  Comments: Pt able to follow exercise prescription today without complaint.  Will continue to monitor for progression.    Dr. Emily Filbert is Medical Director for [place].  Dr. Ottie Glazier is Medical Director for [facility] Pulmonary Rehabilitation.

## 2021-03-12 ENCOUNTER — Other Ambulatory Visit: Payer: Self-pay

## 2021-03-12 DIAGNOSIS — Z951 Presence of aortocoronary bypass graft: Secondary | ICD-10-CM

## 2021-03-12 NOTE — Progress Notes (Signed)
Cardiac Individual Treatment Plan  Patient Details  Name: Hayden Deleon MRN: [id number] Date of Birth: 07-24-1955 Referring Provider:   Flowsheet Row Cardiac Rehab from 12/28/2020 in [facility] Cardiac and Pulmonary Rehab  Referring Provider Arida       Initial Encounter Date:  Flowsheet Row Cardiac Rehab from 12/28/2020 in [facility] Cardiac and Pulmonary Rehab  Date 12/28/20       Visit Diagnosis: S/P CABG x 3  Patient's Home Medications on Admission:  Current Outpatient Medications:    albuterol (VENTOLIN HFA) 108 (90 Base) MCG/ACT inhaler, TAKE 2 PUFFS BY MOUTH EVERY 6 HOURS AS NEEDED FOR WHEEZE OR SHORTNESS OF BREATH, Disp: 6.7 each, Rfl: 10   aspirin EC 325 MG EC tablet, Take 1 tablet (325 mg total) by mouth daily., Disp: 30 tablet, Rfl: 0   atorvastatin (LIPITOR) 80 MG tablet, Take 1 tablet (80 mg total) by mouth daily., Disp: 30 tablet, Rfl: 2   Cyanocobalamin (B-12) 3000 MCG SUBL, Place 3,000 mcg under the tongue daily., Disp: , Rfl:    guaiFENesin (MUCINEX) 600 MG 12 hr tablet, Take 1 tablet (600 mg total) by mouth 2 (two) times daily., Disp: 30 tablet, Rfl: PRN   Multiple Vitamin (MULTIVITAMIN) capsule, Take 1 capsule by mouth daily., Disp: , Rfl:    sertraline (ZOLOFT) 50 MG tablet, Take 1 tablet (50 mg total) by mouth daily., Disp: 90 tablet, Rfl: 1   TRELEGY ELLIPTA 100-62.5-25 MCG/ACT AEPB, INHALE 1 PUFF BY MOUTH EVERY DAY, Disp: 180 each, Rfl: 2  Past Medical History: Past Medical History:  Diagnosis Date   Allergy    Anxiety    Arthritis    right shoulder   CAD (coronary artery disease)    a. 2003 Cath: OM1 100%, good collats, RCA 60-70p-->med rx; b. 04/2020 MV: mild ischemia to sm area of mid inflat & apical lat segments. EF >65%; d. 10/2020 Cath: LM 85ost, LAD 30p/m, OM1 100, RCA 99p/m; e. 10/2020 CABG x 3: LIMA->LAD, VG->RPDA, VG->OM2.   Carotid artery disease ([place])    a. 03/2020 Carotid U/S: RICA 9-02%, LICA 40-97%.   COPD (chronic obstructive pulmonary disease)  ([place])    Significant Dr. Lamonte Sakai   Diastolic dysfunction    a. 03/2018 Echo: EF 60-65%, no rwma, Gr1 DD. Mildly dil LA. Nl RV fxn.   Dilation of ascending aorta and aortic root ([place])    a. 10/2020 Echo: Asc Ao 75m.   Diverticulosis of colon    GERD (gastroesophageal reflux disease)    Gout    Hyperlipidemia    Hypertension    LVH (left ventricular hypertrophy)    a. 10/2020 Echo: EF 70-75%, no rwma, mod LVH. Nl RV size/fxn. Mildly dil RA. Triv MR. Asc Ao 428m   Prostatitis    Sleep apnea    uses cpap nightly    Tobacco Use: Social History   Tobacco Use  Smoking Status Former   Packs/day: 2.00   Years: 32.00   Pack years: 64.00   Types: Cigarettes   Quit date: 08/31/1999   Years since quitting: 21.5  Smokeless Tobacco Former    Labs: Recent ReGovernment social research officeror ITP Cardiac and Pulmonary Rehab Latest Ref Rng & Units 11/05/2020 11/05/2020 11/06/2020 11/06/2020 11/07/2020   Cholestrol 0 - 200 mg/dL - - - - 85   LDLCALC 0 - 99 mg/dL - - - - 34   LDLDIRECT mg/dL - - - - -   HDL >40 mg/dL - - - -  21(L)   Trlycerides <150 mg/dL - - - - 150(H)   Hemoglobin A1c 4.8 - 5.6 % - - - - 5.6   PHART 7.350 - 7.450 7.194(LL) 7.234(L) 7.389 7.362 -   PCO2ART 32.0 - 48.0 mmHg 56.3(H) 50.1(H) 32.8 35.2 -   HCO3 20.0 - 28.0 mmol/L 21.8 21.0 19.8(L) 19.9(L) -   TCO2 22 - 32 mmol/L 23 22 21(L) 21(L) -   ACIDBASEDEF 0.0 - 2.0 mmol/L 6.0(H) 6.0(H) 5.0(H) 5.0(H) -   O2SAT % 92.0 87.0 95.0 89.0 -        Exercise Target Goals: Exercise Program Goal: Individual exercise prescription set using results from initial 6 min walk test and THRR while considering  patient's activity barriers and safety.   Exercise Prescription Goal: Initial exercise prescription builds to 30-45 minutes a day of aerobic activity, 2-3 days per week.  Home exercise guidelines will be given to patient during program as part of exercise prescription that the participant will acknowledge.   Education: Aerobic  Exercise: - Group verbal and visual presentation on the components of exercise prescription. Introduces F.I.T.T principle from ACSM for exercise prescriptions.  Reviews F.I.T.T. principles of aerobic exercise including progression. Written material given at graduation. Flowsheet Row Cardiac Rehab from 03/07/2021 in [facility] Cardiac and Pulmonary Rehab  Education need identified 12/28/20  Date 01/24/21  Educator [facility]  Instruction Review Code 1- Verbalizes Understanding       Education: Resistance Exercise: - Group verbal and visual presentation on the components of exercise prescription. Introduces F.I.T.T principle from ACSM for exercise prescriptions  Reviews F.I.T.T. principles of resistance exercise including progression. Written material given at graduation.    Education: Exercise & Equipment Safety: - Individual verbal instruction and demonstration of equipment use and safety with use of the equipment. Flowsheet Row Cardiac Rehab from 03/07/2021 in [facility] Cardiac and Pulmonary Rehab  Date 12/28/20  Educator AS  Instruction Review Code 1- Verbalizes Understanding       Education: Exercise Physiology & General Exercise Guidelines: - Group verbal and written instruction with models to review the exercise physiology of the cardiovascular system and associated critical values. Provides general exercise guidelines with specific guidelines to those with heart or lung disease.    Education: Flexibility, Balance, Mind/Body Relaxation: - Group verbal and visual presentation with interactive activity on the components of exercise prescription. Introduces F.I.T.T principle from ACSM for exercise prescriptions. Reviews F.I.T.T. principles of flexibility and balance exercise training including progression. Also discusses the mind body connection.  Reviews various relaxation techniques to help reduce and manage stress (i.e. Deep breathing, progressive muscle relaxation, and visualization). Balance handout  provided to take home. Written material given at graduation. Flowsheet Row Cardiac Rehab from 03/07/2021 in [facility] Cardiac and Pulmonary Rehab  Date 02/07/21  Educator AS  Instruction Review Code 1- Verbalizes Understanding       Activity Barriers & Risk Stratification:  Activity Barriers & Cardiac Risk Stratification - 12/22/20 1004       Activity Barriers & Cardiac Risk Stratification   Activity Barriers Other (comment)    Comments COPD; history of shoulder replacement (doesn't cause issues)    Cardiac Risk Stratification High             6 Minute Walk:  6 Minute Walk     Row Name 12/28/20 1525 02/28/21 0735       6 Minute Walk   Phase Initial Discharge    Distance 1115 feet 1405 feet    Distance % Change -- 26 %  Distance Feet Change -- 290 ft    Walk Time 6 minutes 6 minutes    # of Rest Breaks 0 0    MPH 2.1 2.67    METS 3 3.4    RPE 11 10    Perceived Dyspnea  0 0    VO2 Peak 10.5 11.99    Symptoms No No    Resting HR 81 bpm 68 bpm    Resting BP 138/66 114/62    Resting Oxygen Saturation  95 % 95 %    Exercise Oxygen Saturation  during 6 min walk 91 % 89 %    Max Ex. HR 97 bpm 90 bpm    Max Ex. BP 160/72 146/68    2 Minute Post BP 130/66 --             Oxygen Initial Assessment:   Oxygen Re-Evaluation:   Oxygen Discharge (Final Oxygen Re-Evaluation):   Initial Exercise Prescription:  Initial Exercise Prescription - 12/28/20 1500       Date of Initial Exercise RX and Referring Provider   Date 12/28/20    Referring Provider Arida      Treadmill   MPH 2    Grade 1.5    Minutes 15    METs 3      NuStep   Level 3    SPM 80    Minutes 15    METs 3      Elliptical   Level 1    Speed 2.5    Minutes 15    METs 3      REL-XR   Level 3    Speed 50    Minutes 15    METs 3      T5 Nustep   Level 2    SPM 80    Minutes 15    METs 3      Prescription Details   Frequency (times per week) 3    Duration Progress to 30  minutes of continuous aerobic without signs/symptoms of physical distress      Intensity   THRR 40-80% of Max Heartrate 110-140    Ratings of Perceived Exertion 11-15    Perceived Dyspnea 0-4      Resistance Training   Training Prescription Yes    Weight 3 lb    Reps 10-15             Perform Capillary Blood Glucose checks as needed.  Exercise Prescription Changes:   Exercise Prescription Changes     Row Name 12/28/20 1500 01/01/21 1500 01/17/21 1500 01/29/21 1000 01/31/21 0700     Response to Exercise   Blood Pressure (Admit) 138/66 130/76 142/72 110/62 --   Blood Pressure (Exercise) 160/72 142/58 158/70 -- --   Blood Pressure (Exit) 130/66 124/76 108/66 102/69 --   Heart Rate (Admit) 81 bpm 84 bpm 80 bpm 94 bpm --   Heart Rate (Exercise) 97 bpm 110 bpm 115 bpm 104 bpm --   Heart Rate (Exit) 87 bpm 96 bpm 100 bpm 86 bpm --   Oxygen Saturation (Admit) 95 % -- -- -- --   Oxygen Saturation (Exercise) 91 % -- -- -- --   Rating of Perceived Exertion (Exercise) _0 --   Perceived Dyspnea (Exercise) 0 -- -- -- --   Symptoms none none none none --   Comments -- first full day of exercise -- -- --   Duration -- Progress to 30 minutes of  aerobic  without signs/symptoms of physical distress Continue with 30 min of aerobic exercise without signs/symptoms of physical distress. Continue with 30 min of aerobic exercise without signs/symptoms of physical distress. --   Intensity -- THRR unchanged THRR unchanged THRR unchanged --     Progression   Progression -- Continue to progress workloads to maintain intensity without signs/symptoms of physical distress. Continue to progress workloads to maintain intensity without signs/symptoms of physical distress. Continue to progress workloads to maintain intensity without signs/symptoms of physical distress. --   Average METs -- 2.9 2.87 3.12 --     Resistance Training   Training Prescription -- Yes Yes Yes --   Weight -- 3 lb 3 lb 5  lb --   Reps -- 10-15 10-15 10-15 --     Interval Training   Interval Training -- No No No --     Treadmill   MPH -- 2 2 2.5 --   Grade -- 1.5 2 1.5 --   Minutes -- _0 --   METs -- 2.94 3.14 3.43 --     NuStep   Level -- 3 -- -- --   Minutes -- 15 -- -- --   METs -- 3 -- -- --     REL-XR   Level -- -- -- 4 --   Minutes -- -- -- 15 --     T5 Nustep   Level -- -- 3 4 --   Minutes -- -- 15 15 --   METs -- -- 2.6 3 --     Home Exercise Plan   Plans to continue exercise at -- -- -- -- Home (comment)  walking, treadmill, weights   Frequency -- -- -- -- Add 2 additional days to program exercise sessions.   Initial Home Exercises Provided -- -- -- -- 01/31/21     Oxygen   Maintain Oxygen Saturation -- 88% or higher 88% or higher 88% or higher --    Row Name 02/13/21 1500 02/27/21 1400           Response to Exercise   Blood Pressure (Admit) 112/62 120/80      Blood Pressure (Exit) 106/62 110/70      Heart Rate (Admit) 71 bpm 73 bpm      Heart Rate (Exercise) 92 bpm 91 bpm      Heart Rate (Exit) 89 bpm 81 bpm      Rating of Perceived Exertion (Exercise) 11 11      Symptoms none none      Duration Continue with 30 min of aerobic exercise without signs/symptoms of physical distress. Continue with 30 min of aerobic exercise without signs/symptoms of physical distress.      Intensity THRR unchanged THRR unchanged        Progression   Progression Continue to progress workloads to maintain intensity without signs/symptoms of physical distress. Continue to progress workloads to maintain intensity without signs/symptoms of physical distress.      Average METs 3.27 3.67        Resistance Training   Training Prescription Yes Yes      Weight 5 lb 6 lb      Reps 10-15 10-15        Interval Training   Interval Training No No        Treadmill   MPH 2.3 2.8      Grade 1.5 1      Minutes 15 15      METs 3.23 3.53  NuStep   Level 4 4      Minutes 15 15       METs -- 5        T5 Nustep   Level -- 4      Minutes -- 15      METs -- 2.5        Home Exercise Plan   Plans to continue exercise at Home (comment)  walking, treadmill, weights Home (comment)  walking, treadmill, weights      Frequency Add 2 additional days to program exercise sessions. Add 2 additional days to program exercise sessions.      Initial Home Exercises Provided 01/31/21 01/31/21        Oxygen   Maintain Oxygen Saturation -- 88% or higher               Exercise Comments:   Exercise Comments     Row Name 01/01/21 0733 03/02/21 0807 03/12/21 0717       Exercise Comments First full day of exercise!  Patient was oriented to gym and equipment including functions, settings, policies, and procedures.  Patient's individual exercise prescription and treatment plan were reviewed.  All starting workloads were established based on the results of the 6 minute walk test done at initial orientation visit.  The plan for exercise progression was also introduced and progression will be customized based on patient's performance and goals. Joe RRT spoke with Ray about the low saturation readings see while exercising. Ray stated that his physician is aware and is monitoring this concern Jonael graduated today from  rehab with 36 sessions completed.  Details of the patient's exercise prescription and what He needs to do in order to continue the prescription and progress were discussed with patient.  Patient was given a copy of prescription and goals.  Patient verbalized understanding.  Alamin plans to continue to exercise by walking and using treadmill and weights at home.              Exercise Goals and Review:   Exercise Goals     Row Name 12/28/20 1528             Exercise Goals   Increase Physical Activity Yes       Intervention Provide advice, education, support and counseling about physical activity/exercise needs.;Develop an individualized exercise prescription for  aerobic and resistive training based on initial evaluation findings, risk stratification, comorbidities and participant's personal goals.       Expected Outcomes Short Term: Attend rehab on a regular basis to increase amount of physical activity.;Long Term: Add in home exercise to make exercise part of routine and to increase amount of physical activity.;Long Term: Exercising regularly at least 3-5 days a week.       Increase Strength and Stamina Yes       Intervention Provide advice, education, support and counseling about physical activity/exercise needs.;Develop an individualized exercise prescription for aerobic and resistive training based on initial evaluation findings, risk stratification, comorbidities and participant's personal goals.       Expected Outcomes Short Term: Increase workloads from initial exercise prescription for resistance, speed, and METs.;Short Term: Perform resistance training exercises routinely during rehab and add in resistance training at home;Long Term: Improve cardiorespiratory fitness, muscular endurance and strength as measured by increased METs and functional capacity (6MWT)       Able to understand and use rate of perceived exertion (RPE) scale Yes       Intervention Provide education and explanation  on how to use RPE scale       Expected Outcomes Short Term: Able to use RPE daily in rehab to express subjective intensity level;Long Term:  Able to use RPE to guide intensity level when exercising independently       Able to understand and use Dyspnea scale Yes       Intervention Provide education and explanation on how to use Dyspnea scale       Expected Outcomes Short Term: Able to use Dyspnea scale daily in rehab to express subjective sense of shortness of breath during exertion;Long Term: Able to use Dyspnea scale to guide intensity level when exercising independently       Knowledge and understanding of Target Heart Rate Range (THRR) Yes       Intervention Provide  education and explanation of THRR including how the numbers were predicted and where they are located for reference       Expected Outcomes Short Term: Able to state/look up THRR;Short Term: Able to use daily as guideline for intensity in rehab;Long Term: Able to use THRR to govern intensity when exercising independently       Able to check pulse independently Yes       Intervention Provide education and demonstration on how to check pulse in carotid and radial arteries.;Review the importance of being able to check your own pulse for safety during independent exercise       Expected Outcomes Short Term: Able to explain why pulse checking is important during independent exercise;Long Term: Able to check pulse independently and accurately       Understanding of Exercise Prescription Yes       Intervention Provide education, explanation, and written materials on patient's individual exercise prescription       Expected Outcomes Short Term: Able to explain program exercise prescription;Long Term: Able to explain home exercise prescription to exercise independently                Exercise Goals Re-Evaluation :  Exercise Goals Re-Evaluation     Row Name 01/01/21 0733 01/17/21 1600 01/29/21 1158 01/31/21 0742 02/07/21 0742     Exercise Goal Re-Evaluation   Exercise Goals Review Increase Physical Activity;Able to understand and use rate of perceived exertion (RPE) scale;Understanding of Exercise Prescription;Knowledge and understanding of Target Heart Rate Range (THRR);Increase Strength and Stamina;Able to understand and use Dyspnea scale;Able to check pulse independently Increase Physical Activity;Increase Strength and Stamina Increase Physical Activity;Increase Strength and Stamina Increase Physical Activity;Increase Strength and Stamina;Able to understand and use rate of perceived exertion (RPE) scale;Able to understand and use Dyspnea scale;Knowledge and understanding of Target Heart Rate Range  (THRR);Able to check pulse independently;Understanding of Exercise Prescription Increase Physical Activity;Increase Strength and Stamina;Able to understand and use rate of perceived exertion (RPE) scale;Able to understand and use Dyspnea scale;Knowledge and understanding of Target Heart Rate Range (THRR);Able to check pulse independently;Understanding of Exercise Prescription   Comments Reviewed RPE and dyspnea scales, THR and program prescription with pt today.  Pt voiced understanding and was given a copy of goals to take home. Ray is progressing well and has increased levels on TM and T5.  Staff will encourage increasing to 4 or 5 lb for strength work Pearly is doing well in rehab. He did increase his handweights to 5 lbs! He also increased his workload on the treadmill and increased his speed to 2.5. RPEs are in appropriate range. Will continue to monitor. Reviewed home exercise with pt today.  Pt plans  to walk and use treadmill at home for exercise.  He also has a total gym and free weights that he can use.  Reviewed THR, pulse, RPE, sign and symptoms, pulse oximetery and when to call 911 or MD.  Also discussed weather considerations and indoor options.  Pt voiced understanding. He exercises most days when not at rehab. He has a treadmill at home, which he will sometimes put at random. He will workout for 30 minutes at home either on the treadmill or his seated machine. He is working on fixing his bike. Reviewed THRR 110-140.   Expected Outcomes Short: Use RPE daily to regulate intensity. Long: Follow program prescription in THR. Short: try 4 lb Long : build overall stamina Short: Continue to increase loads on treadmill as tolerated Long: Continue to increase overall MET level Short: Start to add in treadmill at home Long: Continue to improve stamina Short: continue with home exercise, try to reach [facility] Long: Continue to improve stamina    Row Name 02/13/21 1505 02/27/21 1403 03/05/21 0747          Exercise Goal Re-Evaluation   Exercise Goals Review Increase Physical Activity;Increase Strength and Stamina Increase Physical Activity;Increase Strength and Stamina;Understanding of Exercise Prescription Increase Physical Activity;Increase Strength and Stamina;Understanding of Exercise Prescription     Comments Ray attends consistently and works at [employer].  He uses 6 lb for strength work.  We will continue to monitor progress. Ray is doing well in rehab.  He is due for this walk test this week.  We expect to see a good improvement in his post walk test.   He is up to 5 METs on the NuStep.  We will continue to monitor his progress. Ray has a bike, treadmill and a stepper at home for exercise when he is done with the program. He also has some weights and weight machines at home. He is going to workout at least 3 days a week at home.     Expected Outcomes Short: maintain consistent attendance and try to reach THR Long: increase average MET level Short: Improve post 6MWT Long: Conitnue to improve stamina Short: workout at home. Long: maintain a healthy lifestyle post [facility].              Discharge Exercise Prescription (Final Exercise Prescription Changes):  Exercise Prescription Changes - 02/27/21 1400       Response to Exercise   Blood Pressure (Admit) 120/80    Blood Pressure (Exit) 110/70    Heart Rate (Admit) 73 bpm    Heart Rate (Exercise) 91 bpm    Heart Rate (Exit) 81 bpm    Rating of Perceived Exertion (Exercise) 11    Symptoms none    Duration Continue with 30 min of aerobic exercise without signs/symptoms of physical distress.    Intensity THRR unchanged      Progression   Progression Continue to progress workloads to maintain intensity without signs/symptoms of physical distress.    Average METs 3.67      Resistance Training   Training Prescription Yes    Weight 6 lb    Reps 10-15      Interval Training   Interval Training No      Treadmill   MPH 2.8    Grade 1     Minutes 15    METs 3.53      NuStep   Level 4    Minutes 15    METs 5  T5 Nustep   Level 4    Minutes 15    METs 2.5      Home Exercise Plan   Plans to continue exercise at Home (comment)   walking, treadmill, weights   Frequency Add 2 additional days to program exercise sessions.    Initial Home Exercises Provided 01/31/21      Oxygen   Maintain Oxygen Saturation 88% or higher             Nutrition:  Target Goals: Understanding of nutrition guidelines, daily intake of sodium <1566m, cholesterol <207m calories 30% from fat and 7% or less from saturated fats, daily to have 5 or more servings of fruits and vegetables.  Education: All About Nutrition: -Group instruction provided by verbal, written material, interactive activities, discussions, models, and posters to present general guidelines for heart healthy nutrition including fat, fiber, MyPlate, the role of sodium in heart healthy nutrition, utilization of the nutrition label, and utilization of this knowledge for meal planning. Follow up email sent as well. Written material given at graduation. Flowsheet [facility]  Education need identified 12/28/20  Date 02/14/21  Educator [place] Village[place]ruction Review Code 1- Verbalizes Understanding       Biometrics:  Pre Biometrics - 12/28/20 1530       Pre Biometrics   Height _0  (1.803 m)    Weight 196 lb 4.8 oz (89 kg)    BMI (Calculated) 27.39    Single Leg Stand 30 seconds             Post Biometrics - 02/28/21 0736        Post  Biometrics   Height _1  (1.803 m)    Weight 187 lb 11.2 oz (85.1 kg)    BMI (Calculated) 26.19    Single Leg Stand 30 seconds             Nutrition Therapy Plan and Nutrition Goals:  Nutrition Therapy & Goals - 01/08/21 1501       Nutrition Therapy   Diet Heart healthy, low Na    Drug/Food Interactions Statins/Certain Fruits    Fiber 30 grams    Whole  Grain Foods 3 servings    Saturated Fats 12 max. grams    Fruits and Vegetables 8 servings/day    Sodium 1.5 grams      Personal Nutrition Goals   Nutrition Goal ST: swap out grits for oatmeal 2-3x/week, add eggs or nuts and seeds to breakfast/ mid morning snack, try out recipes to sneak in vegetables LT: maintain current changes made    Comments Today B: grits (no butter, no salt) S: 4 small club crackers L: healthy choice ([place] mashed potatoes and vegetables) with diet soda S: poptart D: baked chicken and vegetables (variety of non-starchy vegetables, he does not like greens) and sometimes corn. He alters the recipes he had; such as no salt added tomato sauce to his chili. He is not very big on vegetables. Usually drinks water. When he goes out to eat will split the portions. He has stopped eating fast food and honey buns. He loves cooking. He works in a [employer] he gets free bread - white wheat. Discussed heart healthy eating.      Intervention Plan   Intervention Prescribe, educate and counsel regarding individualized specific dietary modifications aiming towards targeted core components such as weight, hypertension, lipid management, diabetes, heart failure and other comorbidities.;Nutrition handout(s) given to  patient.    Expected Outcomes Short Term Goal: Understand basic principles of dietary content, such as calories, fat, sodium, cholesterol and nutrients.;Short Term Goal: A plan has been developed with personal nutrition goals set during dietitian appointment.;Long Term Goal: Adherence to prescribed nutrition plan.             Nutrition Assessments:  MEDIFICTS Score Key: ?70 Need to make dietary changes  40-70 Heart Healthy Diet ? 40 Therapeutic Level Cholesterol Diet  Flowsheet Row Cardiac Rehab from 03/12/2021 in [facility] Cardiac and Pulmonary Rehab  Picture Your Plate Total Score on Admission 74  Picture Your Plate Total Score on Discharge 66      Picture Your  Plate Scores: <96 Unhealthy dietary pattern with much room for improvement. 41-50 Dietary pattern unlikely to meet recommendations for good health and room for improvement. 51-60 More healthful dietary pattern, with some room for improvement.  >60 Healthy dietary pattern, although there may be some specific behaviors that could be improved.    Nutrition Goals Re-Evaluation:  Nutrition Goals Re-Evaluation     [place] Name 02/07/21 0735 03/05/21 0751           Goals   Current Weight 182 lb 8 oz (82.8 kg) 187 lb (84.8 kg)      Nutrition Goal ST:  try out recipes to sneak in vegetables, try some other snacks (hummus with vegetables, dried fruit and nuts, popcorn - no butter, low fat cheese and fruit) LT: maintain current changes made Stay heart healthy      Comment He reports working on adding in more vegetables - he reports reading some of the material I provided him and wants to get a food processor as he feels it would be easier - suggested starting simple and adding additional vegetables into sauces or stews he already eats. He reports eating more oatmeal "so long as it's fixed right". He has added unsalted cashew and penauts to his mid-morning snack. He is having boiled or stewed chicken. He has continues with most of his current changes. he continues to count calries and sodium intake. He snacks on a couple of graham crackers and club crackers. Miloh is doing well with his eating habits. He has no further questions about his diet.      Expected Outcome ST:  try out recipes to sneak in vegetables, try some other snacks (hummus with vegetables, dried fruit and nuts, popcorn - no butter, low fat cheese and fruit) LT: maintain current changes made Short: maintain his eating habits post [facility]. Long: reach his goal weight of 175 pounds.               Nutrition Goals Discharge (Final Nutrition Goals Re-Evaluation):  Nutrition Goals Re-Evaluation - 03/05/21 0751       Goals   Current  Weight 187 lb (84.8 kg)    Nutrition Goal Stay heart healthy    Comment Ketan is doing well with his eating habits. He has no further questions about his diet.    Expected Outcome Short: maintain his eating habits post [facility]. Long: reach his goal weight of 175 pounds.             Psychosocial: Target Goals: Acknowledge presence or absence of significant depression and/or stress, maximize coping skills, provide positive support system. Participant is able to verbalize types and ability to use techniques and skills needed for reducing stress and depression.   Education: Stress, Anxiety, and Depression - Group verbal and visual presentation to define topics  covered.  Reviews how body is impacted by stress, anxiety, and depression.  Also discusses healthy ways to reduce stress and to treat/manage anxiety and depression.  Written material given at graduation.   Education: Sleep Hygiene -Provides group verbal and written instruction about how sleep can affect your health.  Define sleep hygiene, discuss sleep cycles and impact of sleep habits. Review good sleep hygiene tips.    Initial Review & Psychosocial Screening:  Initial Psych Review & Screening - 12/22/20 1007       Initial Review   Current issues with None Identified      Family Dynamics   Good Support System? Yes   wife, mother in law, friends, coworkers     Barriers   Psychosocial barriers to participate in program There are no identifiable barriers or psychosocial needs.      Screening Interventions   Interventions Encouraged to exercise;To provide support and resources with identified psychosocial needs;Provide feedback about the scores to participant    Expected Outcomes Short Term goal: Utilizing psychosocial counselor, staff and physician to assist with identification of specific Stressors or current issues interfering with healing process. Setting desired goal for each stressor or current issue identified.;Long  Term Goal: Stressors or current issues are controlled or eliminated.;Long Term goal: The participant improves quality of Life and PHQ9 Scores as seen by post scores and/or verbalization of changes;Short Term goal: Identification and review with participant of any Quality of Life or Depression concerns found by scoring the questionnaire.             Quality of Life Scores:   Quality of Life - 03/12/21 0807       Quality of Life Scores   Health/Function Pre 27.86 %    Health/Function Post 28.4 %    Health/Function % Change 1.94 %    Socioeconomic Pre 25.5 %    Socioeconomic Post 30 %    Socioeconomic % Change  17.65 %    Psych/Spiritual Pre 30 %    Psych/Spiritual Post 29.64 %    Psych/Spiritual % Change -1.2 %    Family Pre 25.8 %    Family Post 26.4 %    Family % Change 2.33 %    GLOBAL Pre 27.86 %    GLOBAL Post 28.73 %    GLOBAL % Change 3.12 %            Scores of 19 and below usually indicate a poorer quality of life in these areas.  A difference of  2-3 points is a clinically meaningful difference.  A difference of 2-3 points in the total score of the Quality of Life Index has been associated with significant improvement in overall quality of life, self-image, physical symptoms, and general health in studies assessing change in quality of life.  PHQ-9: Recent Review Flowsheet Data     Depression screen [facility] 2/9 03/12/2021 12/28/2020 12/21/2018 04/02/2017   Decreased Interest 0 0 0 0   Down, Depressed, Hopeless 0 0 0 0   PHQ - 2 Score 0 0 0 0   Altered sleeping 0 0 - -   Tired, decreased energy 0 1 - -   Change in appetite 0 0 - -   Feeling bad or failure about yourself  0 0 - -   Trouble concentrating 0 0 - -   Moving slowly or fidgety/restless 0 0 - -   Suicidal thoughts 0 0 - -   PHQ-9 Score 0 1 - -  Difficult doing work/chores Not difficult at all Not difficult at all - -      Interpretation of Total Score  Total Score Depression Severity:  1-4 = Minimal  depression, 5-9 = Mild depression, 10-14 = Moderate depression, 15-19 = Moderately severe depression, 20-27 = Severe depression   Psychosocial Evaluation and Intervention:  Psychosocial Evaluation - 12/22/20 1027       Psychosocial Evaluation & Interventions   Interventions Encouraged to exercise with the program and follow exercise prescription    Comments Ray reports feeling quite well post CABG. He has noted his COPD symptoms feel better as well. He has been walking a few miles a week and is back to work part time. 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 His wife, mother in law, friends and coworkers have been a great support system during his recovery. He and his wife have been making a lot of diet changes since surgery and he has lost 23 lbs since being admitted for CABG. He feels a lot better and is motivated to keep making heart healthy choices.    Expected Outcomes Short: attend cardiac rehab for education and exercise. Long: develop and maintian positive self care habits.    Continue Psychosocial Services  Follow up required by staff             Psychosocial Re-Evaluation:  Psychosocial Re-Evaluation     [place] Name 01/24/21 [id number] 02/07/21 0744 03/05/21 0749         Psychosocial Re-Evaluation   Comments Patient reports no issues with their current mental states, sleep, stress, depression or anxiety. Will follow up with patient in a few weeks for any changes. Ray reports no stressors, he is looking foward to Thanksgiving and Christmas. He reports being level headed and staying positive. He rpeorts sleeping better since his surgery. He likes to relax at home and will play some games on his phone. His support system includes his wife and friends. One of his friends is getting surgery for his heart and he wants to connect with him. Ray states he does not have many stressors and does not let alot of  things get to him. He has a positivew attitude and takes one day at a time.     Expected Outcomes Short: Continue to exercise regularly to support mental health and notify staff of any changes. Long: maintain mental health and well being through teaching of rehab or prescribed medications independently. Short: Continue to exercise regularly to support mental health and notify staff of any changes. Long: maintain mental health and well being through teaching of rehab or prescribed medications independently. Short: graduate [place]. Long: keep a healthy mental state.     Interventions Encouraged to attend Cardiac Rehabilitation for the exercise Encouraged to attend Cardiac Rehabilitation for the exercise --     Continue Psychosocial Services  Follow up required by staff Follow up required by staff No Follow up required              Psychosocial Discharge (Final Psychosocial Re-Evaluation):  Psychosocial Re-Evaluation - 03/05/21 0749       Psychosocial Re-Evaluation   Comments Ray states he does not have many stressors and does not let alot of things get to him. He has a positivew attitude and takes one day at a time.    Expected Outcomes Short: graduate [place]. Long: keep a healthy mental state.  Continue Psychosocial Services  No Follow up required             Vocational Rehabilitation: Provide vocational rehab assistance to qualifying candidates.   Vocational Rehab Evaluation & Intervention:  Vocational Rehab - 12/22/20 1006       Initial Vocational Rehab Evaluation & Intervention   Assessment shows need for Vocational Rehabilitation No             Education: Education Goals: Education classes will be provided on a variety of topics geared toward better understanding of heart health and risk factor modification. Participant will state understanding/return demonstration of topics presented as noted by education test scores.  Learning Barriers/Preferences:  Learning  Barriers/Preferences - 12/22/20 1006       Learning Barriers/Preferences   Learning Barriers None             General Cardiac Education Topics:  AED/CPR: - Group verbal and written instruction with the use of models to demonstrate the basic use of the AED with the basic ABC's of resuscitation.   Anatomy and Cardiac Procedures: - Group verbal and visual presentation and models provide information about basic cardiac anatomy and function. Reviews the testing methods done to diagnose heart disease and the outcomes of the test results. Describes the treatment choices: Medical Management, Angioplasty, or Coronary Bypass Surgery for treating various heart conditions including Myocardial Infarction, Angina, Valve Disease, and Cardiac Arrhythmias.  Written material given at graduation. Flowsheet Row Cardiac Rehab from 03/07/2021 in [facility] Cardiac and Pulmonary Rehab  Education need identified 12/28/20       Medication Safety: - Group verbal and visual instruction to review commonly prescribed medications for heart and lung disease. Reviews the medication, class of the drug, and side effects. Includes the steps to properly store meds and maintain the prescription regimen.  Written material given at graduation. Flowsheet Row Cardiac Rehab from 03/07/2021 in [facility] Cardiac and Pulmonary Rehab  Date 02/21/21  Educator SB  Instruction Review Code 1- Verbalizes Understanding       Intimacy: - Group verbal instruction through game format to discuss how heart and lung disease can affect sexual intimacy. Written material given at graduation.. Flowsheet Row Cardiac Rehab from 03/07/2021 in [facility] Cardiac and Pulmonary Rehab  Date 01/24/21  Educator [facility]  Instruction Review Code 1- Verbalizes Understanding       Know Your Numbers and Heart Failure: - Group verbal and visual instruction to discuss disease risk factors for cardiac and pulmonary disease and treatment options.  Reviews associated  critical values for Overweight/Obesity, Hypertension, Cholesterol, and Diabetes.  Discusses basics of heart failure: signs/symptoms and treatments.  Introduces Heart Failure Zone chart for action plan for heart failure.  Written material given at graduation. Flowsheet Row Cardiac Rehab from 03/07/2021 in [facility] Cardiac and Pulmonary Rehab  Date 02/28/21  Educator SB  Instruction Review Code 1- Verbalizes Understanding       Infection Prevention: - Provides verbal and written material to individual with discussion of infection control including proper hand washing and proper equipment cleaning during exercise session. Flowsheet Row Cardiac Rehab from 03/07/2021 in [facility] Cardiac and Pulmonary Rehab  Date 12/28/20  Educator AS  Instruction Review Code 1- Verbalizes Understanding       Falls Prevention: - Provides verbal and written material to individual with discussion of falls prevention and safety. Flowsheet Row Cardiac Rehab from 03/07/2021 in [facility] Cardiac and Pulmonary Rehab  Date 12/28/20  Educator AS  Instruction Review Code 1- Verbalizes Understanding  Other: -Provides group and verbal instruction on various topics (see comments)   Knowledge Questionnaire Score:  Knowledge Questionnaire Score - 03/12/21 0807       Knowledge Questionnaire Score   Pre Score 22/26    Post Score 23/26             Core Components/Risk Factors/Patient Goals at Admission:  Personal Goals and Risk Factors at Admission - 12/28/20 1530       Core Components/Risk Factors/Patient Goals on Admission   Hypertension Yes    Intervention Provide education on lifestyle modifcations including regular physical activity/exercise, weight management, moderate sodium restriction and increased consumption of fresh fruit, vegetables, and low fat dairy, alcohol moderation, and smoking cessation.;Monitor prescription use compliance.    Expected Outcomes Short Term: Continued assessment and intervention  until BP is < 140/37m HG in hypertensive participants. < 130/848mHG in hypertensive participants with diabetes, heart failure or chronic kidney disease.;Long Term: Maintenance of blood pressure at goal levels.    Lipids Yes    Intervention Provide education and support for participant on nutrition & aerobic/resistive exercise along with prescribed medications to achieve LDL <7081mHDL >29m42m  Expected Outcomes Short Term: Participant states understanding of desired cholesterol values and is compliant with medications prescribed. Participant is following exercise prescription and nutrition guidelines.;Long Term: Cholesterol controlled with medications as prescribed, with individualized exercise RX and with personalized nutrition plan. Value goals: LDL < 70mg83mL > 40 mg.             Education:Diabetes - Individual verbal and written instruction to review signs/symptoms of diabetes, desired ranges of glucose level fasting, after meals and with exercise. Acknowledge that pre and post exercise glucose checks will be done for 3 sessions at entry of program.   Core Components/Risk Factors/Patient Goals Review:   Goals and Risk Factor Review     Row Name 01/24/21 0743 02/07/21 0733 03/05/21 0753         Core Components/Risk Factors/Patient Goals Review   Personal Goals Review Hypertension Hypertension Weight Management/Obesity;Improve shortness of breath with ADL's     Review Ray has been checking his b,oood pressure at home and routinely. He states that it has been on average 116's over 70's. Informed him that his is good to check his blood pressure at home and continue to do so. His blood pressure in the program has been doing well and has no questions about it. Ray reports not taking his BP at home right now, he needs to get a new one and then he will get back to it - in class today his resting BP was 128/60. Ray informs us [place] he checks his blood pressure at home and is getting a new cuff.  He monitors his oxygen at home as well. Dr. Kasa Mortimer Friesware of his oxygen desaturation in rehab. He has COPD and knows how to manage if his oxygen gets low.     Expected Outcomes Short" check blood pressures at home. Long: maintain blood pressure reading at home independently ST: get new BP cuff LT: check BP at home routinely Short: maintain spo2 at home. Long: maintain spo2 88 percent and above independently.              Core Components/Risk Factors/Patient Goals at Discharge (Final Review):   Goals and Risk Factor Review - 03/05/21 0753       Core Components/Risk Factors/Patient Goals Review   Personal Goals Review Weight Management/Obesity;Improve shortness of breath with ADL's  Review Ray informs [place] that he checks his blood pressure at home and is getting a new cuff. He monitors his oxygen at home as well. Dr. Mortimer Fries is aware of his oxygen desaturation in rehab. He has COPD and knows how to manage if his oxygen gets low.    Expected Outcomes Short: maintain spo2 at home. Long: maintain spo2 88 percent and above independently.             ITP Comments:  ITP Comments     Row Name 12/22/20 1025 12/28/20 1537 01/01/21 0733 01/03/21 0821 01/08/21 1533   ITP Comments Initial telephone orientation completed. Diagnosis can be found in [facility] 8/5. EP orientation scheduled for Thursday 9/29 at 2pm. Completed 6MWT and gym orientation. Initial ITP created and sent for review to Dr. Emily Filbert, Medical Director. First full day of exercise!  Patient was oriented to gym and equipment including functions, settings, policies, and procedures.  Patient's individual exercise prescription and treatment plan were reviewed.  All starting workloads were established based on the results of the 6 minute walk test done at initial orientation visit.  The plan for exercise progression was also introduced and progression will be customized based on patient's performance and goals. 30 day review completed. ITP sent to  Dr. Emily Filbert, Medical Director of Cardiac Rehab. Continue with ITP unless changes are made by physician. Completed initial RD consultation    Row Name 01/31/21 0645 02/28/21 0640 03/02/21 0806 03/12/21 0717     ITP Comments 30 Day review completed. Medical Director ITP review done, changes made as directed, and signed approval by Medical Director. 30 Day review completed. Medical Director ITP review done, changes made as directed, and signed approval by Medical Director. Joe RRT spoke with Ray about the low saturation readings see while exercising. Ray stated that his physician is aware and is monitoring this concern Alastor graduated today from  rehab with 36 sessions completed.  Details of the patient's exercise prescription and what He needs to do in order to continue the prescription and progress were discussed with patient.  Patient was given a copy of prescription and goals.  Patient verbalized understanding.  Zamere plans to continue to exercise by walking and using treadmill and weights at home.             Comments: discharge ITP

## 2021-03-12 NOTE — Progress Notes (Signed)
Discharge Progress Report  Patient Details  Name: Hayden Deleon MRN: [id number] Date of Birth: 04-16-1955 Referring Provider:   Flowsheet Row Cardiac Rehab from 12/28/2020 in [facility] Cardiac and Pulmonary Rehab  Referring Provider [place]        Number of Visits: 47  Reason for Discharge:  Patient reached a stable level of exercise. Patient independent in their exercise. Patient has met program and personal goals.  Smoking History:  Social History   Tobacco Use  Smoking Status Former   Packs/day: 2.00   Years: 32.00   Pack years: 64.00   Types: Cigarettes   Quit date: 08/31/1999   Years since quitting: 21.5  Smokeless Tobacco Former    Diagnosis:  S/P CABG x 3  ADL UCSD:   Initial Exercise Prescription:  Initial Exercise Prescription - 12/28/20 1500       Date of Initial Exercise RX and Referring Provider   Date 12/28/20    Referring Provider Arida      Treadmill   MPH 2    Grade 1.5    Minutes 15    METs 3      NuStep   Level 3    SPM 80    Minutes 15    METs 3      Elliptical   Level 1    Speed 2.5    Minutes 15    METs 3      REL-XR   Level 3    Speed 50    Minutes 15    METs 3      T5 Nustep   Level 2    SPM 80    Minutes 15    METs 3      Prescription Details   Frequency (times per week) 3    Duration Progress to 30 minutes of continuous aerobic without signs/symptoms of physical distress      Intensity   THRR 40-80% of Max Heartrate 110-140    Ratings of Perceived Exertion 11-15    Perceived Dyspnea 0-4      Resistance Training   Training Prescription Yes    Weight 3 lb    Reps 10-15             Discharge Exercise Prescription (Final Exercise Prescription Changes):  Exercise Prescription Changes - 02/27/21 1400       Response to Exercise   Blood Pressure (Admit) 120/80    Blood Pressure (Exit) 110/70    Heart Rate (Admit) 73 bpm    Heart Rate (Exercise) 91 bpm    Heart Rate (Exit) 81 bpm    Rating of Perceived  Exertion (Exercise) 11    Symptoms none    Duration Continue with 30 min of aerobic exercise without signs/symptoms of physical distress.    Intensity THRR unchanged      Progression   Progression Continue to progress workloads to maintain intensity without signs/symptoms of physical distress.    Average METs 3.67      Resistance Training   Training Prescription Yes    Weight 6 lb    Reps 10-15      Interval Training   Interval Training No      Treadmill   MPH 2.8    Grade 1    Minutes 15    METs 3.53      NuStep   Level 4    Minutes 15    METs 5      T5 Nustep   Level 4  Minutes 15    METs 2.5      Home Exercise Plan   Plans to continue exercise at Home (comment)   walking, treadmill, weights   Frequency Add 2 additional days to program exercise sessions.    Initial Home Exercises Provided 01/31/21      Oxygen   Maintain Oxygen Saturation 88% or higher             Functional Capacity:  6 Minute Walk     Row Name 12/28/20 1525 02/28/21 0735       6 Minute Walk   Phase Initial Discharge    Distance 1115 feet 1405 feet    Distance % Change -- 26 %    Distance Feet Change -- 290 ft    Walk Time 6 minutes 6 minutes    # of Rest Breaks 0 0    MPH 2.1 2.67    METS 3 3.4    RPE 11 10    Perceived Dyspnea  0 0    VO2 Peak 10.5 11.99    Symptoms No No    Resting HR 81 bpm 68 bpm    Resting BP 138/66 114/62    Resting Oxygen Saturation  95 % 95 %    Exercise Oxygen Saturation  during 6 min walk 91 % 89 %    Max Ex. HR 97 bpm 90 bpm    Max Ex. BP 160/72 146/68    2 Minute Post BP 130/66 --             Psychological, QOL, Others - Outcomes: PHQ 2/9: Depression screen [facility] 2/9 03/12/2021 12/28/2020 12/21/2018 04/02/2017  Decreased Interest 0 0 0 0  Down, Depressed, Hopeless 0 0 0 0  PHQ - 2 Score 0 0 0 0  Altered sleeping 0 0 - -  Tired, decreased energy 0 1 - -  Change in appetite 0 0 - -  Feeling bad or failure about yourself  0 0 - -   Trouble concentrating 0 0 - -  Moving slowly or fidgety/restless 0 0 - -  Suicidal thoughts 0 0 - -  PHQ-9 Score 0 1 - -  Difficult doing work/chores Not difficult at all Not difficult at all - -  Some recent data might be hidden    Quality of Life:  Quality of Life - 03/12/21 0807       Quality of Life Scores   Health/Function Pre 27.86 %    Health/Function Post 28.4 %    Health/Function % Change 1.94 %    Socioeconomic Pre 25.5 %    Socioeconomic Post 30 %    Socioeconomic % Change  17.65 %    Psych/Spiritual Pre 30 %    Psych/Spiritual Post 29.64 %    Psych/Spiritual % Change -1.2 %    Family Pre 25.8 %    Family Post 26.4 %    Family % Change 2.33 %    GLOBAL Pre 27.86 %    GLOBAL Post 28.73 %    GLOBAL % Change 3.12 %                Nutrition & Weight - Outcomes:  Pre Biometrics - 12/28/20 1530       Pre Biometrics   Height $Remov'5\' 11"'HLFnsc$  (1.803 m)    Weight 196 lb 4.8 oz (89 kg)    BMI (Calculated) 27.39    Single Leg Stand 30 seconds  Post Biometrics - 02/28/21 0736        Post  Biometrics   Height $Remov'5\' 11"'AvFKqI$  (1.803 m)    Weight 187 lb 11.2 oz (85.1 kg)    BMI (Calculated) 26.19    Single Leg Stand 30 seconds             Nutrition:  Nutrition Therapy & Goals - 01/08/21 1501       Nutrition Therapy   Diet Heart healthy, low Na    Drug/Food Interactions Statins/Certain Fruits    Fiber 30 grams    Whole Grain Foods 3 servings    Saturated Fats 12 max. grams    Fruits and Vegetables 8 servings/day    Sodium 1.5 grams      Personal Nutrition Goals   Nutrition Goal ST: swap out grits for oatmeal 2-3x/week, add eggs or nuts and seeds to breakfast/ mid morning snack, try out recipes to sneak in vegetables LT: maintain current changes made    Comments Today B: grits (no butter, no salt) S: 4 small club crackers L: healthy choice ([place] with mashed potatoes and vegetables) with diet soda S: poptart D: baked chicken and vegetables  (variety of non-starchy vegetables, he does not like greens) and sometimes corn. He alters the recipes he had; such as no salt added tomato sauce to his chili. He is not very big on vegetables. Usually drinks water. When he goes out to eat will split the portions. He has stopped eating fast food and honey buns. He loves cooking. He works in [employer] where he gets free bread - white wheat. Discussed heart healthy eating.      Intervention Plan   Intervention Prescribe, educate and counsel regarding individualized specific dietary modifications aiming towards targeted core components such as weight, hypertension, lipid management, diabetes, heart failure and other comorbidities.;Nutrition handout(s) given to patient.    Expected Outcomes Short Term Goal: Understand basic principles of dietary content, such as calories, fat, sodium, cholesterol and nutrients.;Short Term Goal: A plan has been developed with personal nutrition goals set during dietitian appointment.;Long Term Goal: Adherence to prescribed nutrition plan.             Nutrition Discharge:   Education Questionnaire Score:  Knowledge Questionnaire Score - 03/12/21 0807       Knowledge Questionnaire Score   Pre Score 22/26    Post Score 23/26             Goals reviewed with patient; copy given to patient.

## 2021-03-12 NOTE — Progress Notes (Signed)
Daily Session Note  Patient Details  Name: Hayden Deleon MRN: [id number] Date of Birth: Aug 09, 1955 Referring Provider:   Flowsheet Row Cardiac Rehab from 12/28/2020 in [facility] Cardiac and Pulmonary Rehab  Referring Provider Fletcher Anon       Encounter Date: 03/12/2021  Check In:  Session Check In - 03/12/21 0716       Check-In   Supervising physician immediately available to respond to emergencies See telemetry face sheet for immediately available ER MD    Location [facility]-Cardiac & Pulmonary Rehab    Staff Present Birdie Sons, MPA, Mauricia Area, BS, ACSM CEP, Exercise Physiologist;Joseph Tessie Fass, [place]    Virtual Visit No    Medication changes reported     No    Fall or balance concerns reported    No    Tobacco Cessation No Change    Warm-up and Cool-down Performed on first and last piece of equipment    Resistance Training Performed Yes    VAD Patient? No    PAD/SET Patient? No      Pain Assessment   Currently in Pain? No/denies                Social History   Tobacco Use  Smoking Status Former   Packs/day: 2.00   Years: 32.00   Pack years: 64.00   Types: Cigarettes   Quit date: 08/31/1999   Years since quitting: 21.5  Smokeless Tobacco Former    Goals Met:  Independence with exercise equipment Exercise tolerated well No report of concerns or symptoms today Strength training completed today  Goals Unmet:  Not Applicable  Comments:  Hayden Deleon graduated today from  rehab with 36 sessions completed.  Details of the patient's exercise prescription and what He needs to do in order to continue the prescription and progress were discussed with patient.  Patient was given a copy of prescription and goals.  Patient verbalized understanding.  Hayden Deleon plans to continue to exercise by walking and using treadmill and weights at home.    Dr. Emily Filbert is Medical Director for [place].  Dr. Ottie Glazier is Medical Director for [facility]  Pulmonary Rehabilitation.

## 2021-03-20 ENCOUNTER — Encounter: Payer: BC Managed Care – PPO | Admitting: Family Medicine

## 2021-03-29 ENCOUNTER — Other Ambulatory Visit: Payer: Self-pay

## 2021-03-30 ENCOUNTER — Ambulatory Visit (INDEPENDENT_AMBULATORY_CARE_PROVIDER_SITE_OTHER): Payer: BC Managed Care – PPO | Admitting: Family Medicine

## 2021-03-30 ENCOUNTER — Encounter: Payer: Self-pay | Admitting: Family Medicine

## 2021-03-30 VITALS — BP 124/70 | HR 70 | Temp 97.4°F | Ht 69.0 in | Wt 191.2 lb

## 2021-03-30 DIAGNOSIS — E538 Deficiency of other specified B group vitamins: Secondary | ICD-10-CM

## 2021-03-30 DIAGNOSIS — Z23 Encounter for immunization: Secondary | ICD-10-CM | POA: Diagnosis not present

## 2021-03-30 DIAGNOSIS — Z0000 Encounter for general adult medical examination without abnormal findings: Secondary | ICD-10-CM | POA: Diagnosis not present

## 2021-03-30 DIAGNOSIS — I25118 Atherosclerotic heart disease of native coronary artery with other forms of angina pectoris: Secondary | ICD-10-CM | POA: Diagnosis not present

## 2021-03-30 DIAGNOSIS — Z951 Presence of aortocoronary bypass graft: Secondary | ICD-10-CM | POA: Diagnosis not present

## 2021-03-30 NOTE — Progress Notes (Signed)
Established Patient Office Visit  Subjective:  Patient ID: Hayden Deleon, male    DOB: 1955/07/07  Age: 65 y.o. MRN: [id number]  CC:  Chief Complaint  Patient presents with   Annual Exam    CPE/labs.  Not fasting today.  No concerns.  Wants flu shot.     HPI Hayden Deleon presents for health check and physical exam.  He is nonfasting this afternoon.  This is been a significant year for him.  Routine cardiac catheterization led to emergent transfer to hospital for CABG surgery x3.  Patient has done quite well since.  Denies any further chest pain shortness of breath or dyspnea.  He is tolerating high-dose atorvastatin well.  He is back at work full-time.  He is exercising aerobically and with weight training 5 days weekly.  Past Medical History:  Diagnosis Date   Allergy    Anxiety    Arthritis    right shoulder   CAD (coronary artery disease)    a. 2003 Cath: OM1 100%, good collats, RCA 60-70p-->med rx; b. 04/2020 MV: mild ischemia to sm area of mid inflat & apical lat segments. EF >65%; d. 10/2020 Cath: LM 85ost, LAD 30p/m, OM1 100, RCA 99p/m; e. 10/2020 CABG x 3: LIMA->LAD, VG->RPDA, VG->OM2.   Carotid artery disease ([place])    a. 03/2020 Carotid U/S: RICA 3-15%, LICA 40-08%.   COPD (chronic obstructive pulmonary disease) ([place])    Significant Dr. Lamonte Sakai   Diastolic dysfunction    a. 03/2018 Echo: EF 60-65%, no rwma, Gr1 DD. Mildly dil LA. Nl RV fxn.   Dilation of ascending aorta and aortic root ([place])    a. 10/2020 Echo: Asc Ao 40mm.   Diverticulosis of colon    GERD (gastroesophageal reflux disease)    Gout    Hyperlipidemia    Hypertension    LVH (left ventricular hypertrophy)    a. 10/2020 Echo: EF 70-75%, no rwma, mod LVH. Nl RV size/fxn. Mildly dil RA. Triv MR. Asc Ao 70mm.   Prostatitis    Sleep apnea    uses cpap nightly    Past Surgical History:  Procedure Laterality Date   COLONOSCOPY  12/2003   [place] - polyp   CORONARY ARTERY BYPASS GRAFT N/A 11/05/2020    Procedure: CORONARY ARTERY BYPASS GRAFTING (CABG) TIMES 3, USING LEFT INTERNAL MAMMARY ARTERY AND GREATER SAPHEOUS VEIN HARVESTED ENDOSCOPICALLY LEFT AND RIGHT LEGS;  Surgeon: Lajuana Matte, MD;  Location: [place];  Service: Open Heart Surgery;  Laterality: N/A;   deviated septum     KNEE ARTHROSCOPY     right   LEFT HEART CATH AND CORONARY ANGIOGRAPHY N/A 11/03/2020   Procedure: LEFT HEART CATH AND CORONARY ANGIOGRAPHY;  Surgeon: Wellington Hampshire, MD;  Location: [place] [facility];  Service: Cardiovascular;  Laterality: N/A;   TOTAL SHOULDER ARTHROPLASTY Right 02/03/2018   Procedure: RIGHT REVERSE SHOULDER ARTHROPLASTY;  Surgeon: Meredith Pel, MD;  Location: [place];  Service: Orthopedics;  Laterality: Right;   WISDOM TOOTH EXTRACTION      Family History  Problem Relation Age of Onset   Angina Mother        car accident   Asthma Father    Crohn's disease Sister    Colon cancer Neg Hx    Esophageal cancer Neg Hx    Rectal cancer Neg Hx    Stomach cancer Neg Hx     Social History   Socioeconomic History   Marital status: Married  Spouse name: Not on file   Number of children: 0   Years of education: Not on file   Highest education level: Not on file  Occupational History   Occupation: Freight forwarder at [employer]    Employer: [employer]  Tobacco Use   Smoking status: Former    Packs/day: 2.00    Years: 32.00    Pack years: 64.00    Types: Cigarettes    Quit date: 08/31/1999    Years since quitting: 21.5   Smokeless tobacco: Former  Scientific laboratory technician Use: Never used  Substance and Sexual Activity   Alcohol use: No    Alcohol/week: 0.0 standard drinks   Drug use: No   Sexual activity: Yes  Other Topics Concern   Not on file  Social History Narrative   Not on file   Social Determinants of Health   Financial Resource Strain: Not on file  Food Insecurity: Not on file  Transportation Needs: Not on file  Physical Activity: Not on file   Stress: Not on file  Social Connections: Not on file  Intimate Partner Violence: Not on file    Outpatient Medications Prior to Visit  Medication Sig Dispense Refill   albuterol (VENTOLIN HFA) 108 (90 Base) MCG/ACT inhaler TAKE 2 PUFFS BY MOUTH EVERY 6 HOURS AS NEEDED FOR WHEEZE OR SHORTNESS OF BREATH 6.7 each 10   aspirin EC 325 MG EC tablet Take 1 tablet (325 mg total) by mouth daily. 30 tablet 0   atorvastatin (LIPITOR) 80 MG tablet Take 1 tablet (80 mg total) by mouth daily. 30 tablet 2   Cyanocobalamin (B-12) 3000 MCG SUBL Place 3,000 mcg under the tongue daily.     Multiple Vitamin (MULTIVITAMIN) capsule Take 1 capsule by mouth daily.     sertraline (ZOLOFT) 50 MG tablet Take 1 tablet (50 mg total) by mouth daily. 90 tablet 1   TRELEGY ELLIPTA 100-62.5-25 MCG/ACT AEPB INHALE 1 PUFF BY MOUTH EVERY DAY 180 each 2   guaiFENesin (MUCINEX) 600 MG 12 hr tablet Take 1 tablet (600 mg total) by mouth 2 (two) times daily. 30 tablet PRN   No facility-administered medications prior to visit.    Allergies  Allergen Reactions   Avelox [Moxifloxacin Hcl In Nacl] Other (See Comments)    Facial swelling   Sulfamethoxazole-Trimethoprim Other (See Comments)    REACTION: sore mouth   Imdur [Isosorbide Nitrate] Other (See Comments)    Made pt feel groggy, loopy, bad dreams    Aspirin Other (See Comments)    REACTION: Nausea; fine with 81mg     ROS Review of Systems  Constitutional:  Negative for diaphoresis, fatigue, fever and unexpected weight change.  HENT: Negative.    Eyes:  Negative for photophobia and visual disturbance.  Respiratory: Negative.    Cardiovascular: Negative.   Gastrointestinal: Negative.   Endocrine: Negative for polyphagia and polyuria.  Genitourinary: Negative.   Musculoskeletal:  Negative for gait problem and joint swelling.  Skin:  Negative for pallor and rash.  Neurological:  Negative for speech difficulty and weakness.  Hematological:  Does not bruise/bleed  easily.  Psychiatric/Behavioral: Negative.       Objective:    Physical Exam Constitutional:      General: He is not in acute distress.    Appearance: Normal appearance. He is not ill-appearing, toxic-appearing or diaphoretic.  HENT:     Head: Normocephalic and atraumatic.     Right Ear: Tympanic membrane, ear canal and external ear normal.  Left Ear: Tympanic membrane, ear canal and external ear normal.     Mouth/Throat:     Mouth: Mucous membranes are moist.     Pharynx: Oropharynx is clear. No oropharyngeal exudate or posterior oropharyngeal erythema.  Eyes:     General: No scleral icterus.       Right eye: No discharge.        Left eye: No discharge.     Extraocular Movements: Extraocular movements intact.     Conjunctiva/sclera: Conjunctivae normal.     Pupils: Pupils are equal, round, and reactive to light.  Neck:     Vascular: No carotid bruit.  Cardiovascular:     Rate and Rhythm: Normal rate and regular rhythm.  Pulmonary:     Effort: Pulmonary effort is normal.     Breath sounds: Normal breath sounds.  Abdominal:     General: Bowel sounds are normal.  Musculoskeletal:     Cervical back: No rigidity or tenderness.  Lymphadenopathy:     Cervical: No cervical adenopathy.  Skin:    General: Skin is warm and dry.  Neurological:     Mental Status: He is alert and oriented to person, place, and time.  Psychiatric:        Behavior: Behavior normal.    BP 124/70    Pulse 70    Temp (!) 97.4 F (36.3 C) (Temporal)    Ht 5\' 9"  (1.753 m)    Wt 191 lb 3.2 oz (86.7 kg)    SpO2 94%    BMI 28.24 kg/m  Wt Readings from Last 3 Encounters:  03/30/21 191 lb 3.2 oz (86.7 kg)  02/28/21 187 lb 11.2 oz (85.1 kg)  01/26/21 189 lb (85.7 kg)     Health Maintenance Due  Topic Date Due   Zoster Vaccines- Shingrix (1 of 2) Never done   Pneumonia Vaccine 57+ Years old (2 - PCV) 04/02/2018   TETANUS/TDAP  06/22/2019   COVID-19 Vaccine (3 - Booster for Pfizer series)  09/29/2019   COLONOSCOPY (Pts 45-37yrs Insurance coverage will need to be confirmed)  01/05/2021    There are no preventive care reminders to display for this patient.  Lab Results  Component Value Date   TSH 1.36 12/16/2019   Lab Results  Component Value Date   WBC 10.0 11/10/2020   HGB 9.7 (L) 11/10/2020   HCT 29.6 (L) 11/10/2020   MCV 99.3 11/10/2020   PLT 207 11/10/2020   Lab Results  Component Value Date   NA 137 11/10/2020   K 3.7 11/10/2020   CO2 25 11/10/2020   GLUCOSE 126 (H) 11/10/2020   BUN 15 11/10/2020   CREATININE 0.97 11/10/2020   BILITOT 0.9 11/04/2020   ALKPHOS 50 11/04/2020   AST 17 11/04/2020   ALT 19 11/04/2020   PROT 6.1 (L) 11/04/2020   ALBUMIN 3.3 (L) 11/04/2020   CALCIUM 8.9 11/10/2020   ANIONGAP 8 11/10/2020   GFR 80.79 12/16/2019   Lab Results  Component Value Date   CHOL 85 11/07/2020   Lab Results  Component Value Date   HDL 21 (L) 11/07/2020   Lab Results  Component Value Date   LDLCALC 34 11/07/2020   Lab Results  Component Value Date   TRIG 150 (H) 11/07/2020   Lab Results  Component Value Date   CHOLHDL 4.0 11/07/2020   Lab Results  Component Value Date   HGBA1C 5.6 11/07/2020      Assessment & Plan:   Problem List Items Addressed This  Visit       Cardiovascular and Mediastinum   Coronary artery disease of native artery of native heart with stable angina pectoris (HCC)   Relevant Orders   Comprehensive metabolic panel   Lipid panel     Other   B12 deficiency   Relevant Orders   Vitamin B12   Healthcare maintenance - Primary   Relevant Orders   CBC   Comprehensive metabolic panel   Lipid panel   PSA   Urinalysis, Routine w reflex microscopic   S/P CABG x 3   Other Visit Diagnoses     Need for pneumococcal vaccination       Relevant Orders   Pneumococcal conjugate vaccine 20-valent (Prevnar 20)   Flu vaccine need       Relevant Orders   Flu vaccine HIGH DOSE PF (Fluzone High dose)   Flu Vaccine  QUAD High Dose(Fluad)       No orders of the defined types were placed in this encounter.   Follow-up: Return in about 6 months (around 09/28/2021).  Information was given on health maintenance and disease prevention.  Encouraged him to keep up his healthy lifestyle.  Also gave information on the Mediterranean diet as an alternative for him.  Information was given on the Prevnar 20 vaccine.  Libby Maw, MD

## 2021-04-04 ENCOUNTER — Other Ambulatory Visit (INDEPENDENT_AMBULATORY_CARE_PROVIDER_SITE_OTHER): Payer: BC Managed Care – PPO

## 2021-04-04 ENCOUNTER — Other Ambulatory Visit: Payer: Self-pay

## 2021-04-04 DIAGNOSIS — I25118 Atherosclerotic heart disease of native coronary artery with other forms of angina pectoris: Secondary | ICD-10-CM | POA: Diagnosis not present

## 2021-04-04 DIAGNOSIS — E538 Deficiency of other specified B group vitamins: Secondary | ICD-10-CM | POA: Diagnosis not present

## 2021-04-04 DIAGNOSIS — Z0000 Encounter for general adult medical examination without abnormal findings: Secondary | ICD-10-CM | POA: Diagnosis not present

## 2021-04-04 LAB — COMPREHENSIVE METABOLIC PANEL
ALT: 14 U/L (ref 0–53)
AST: 15 U/L (ref 0–37)
Albumin: 4.1 g/dL (ref 3.5–5.2)
Alkaline Phosphatase: 63 U/L (ref 39–117)
BUN: 14 mg/dL (ref 6–23)
CO2: 28 mEq/L (ref 19–32)
Calcium: 9.3 mg/dL (ref 8.4–10.5)
Chloride: 105 mEq/L (ref 96–112)
Creatinine, Ser: 0.95 mg/dL (ref 0.40–1.50)
GFR: 84.1 mL/min (ref >60.00)
Glucose, Bld: 85 mg/dL (ref 70–99)
Potassium: 4.3 mEq/L (ref 3.5–5.1)
Sodium: 141 mEq/L (ref 135–145)
Total Bilirubin: 0.8 mg/dL (ref 0.2–1.2)
Total Protein: 6.4 g/dL (ref 6.0–8.3)

## 2021-04-04 LAB — URINALYSIS, ROUTINE W REFLEX MICROSCOPIC
Bilirubin Urine: NEGATIVE
Hgb urine dipstick: NEGATIVE
Ketones, ur: NEGATIVE
Leukocytes,Ua: NEGATIVE
Nitrite: NEGATIVE
Specific Gravity, Urine: >=1.03 — AB (ref 1.000–1.030)
Total Protein, Urine: NEGATIVE
Urine Glucose: NEGATIVE
Urobilinogen, UA: 0.2 (ref 0.0–1.0)
pH: 5.5 (ref 5.0–8.0)

## 2021-04-04 LAB — LIPID PANEL
Cholesterol: 125 mg/dL (ref 0–200)
HDL: 43.5 mg/dL (ref >39.00)
LDL Cholesterol: 59 mg/dL (ref 0–99)
NonHDL: 81.08
Total CHOL/HDL Ratio: 3
Triglycerides: 111 mg/dL (ref 0.0–149.0)
VLDL: 22.2 mg/dL (ref 0.0–40.0)

## 2021-04-04 LAB — CBC
HCT: 45.9 % (ref 39.0–52.0)
Hemoglobin: 15.2 g/dL (ref 13.0–17.0)
MCHC: 33.1 g/dL (ref 30.0–36.0)
MCV: 93.3 fl (ref 78.0–100.0)
Platelets: 168 10*3/uL (ref 150.0–400.0)
RBC: 4.91 Mil/uL (ref 4.22–5.81)
RDW: 15 % (ref 11.5–15.5)
WBC: 6.8 10*3/uL (ref 4.0–10.5)

## 2021-04-04 LAB — VITAMIN B12: Vitamin B-12: >1550 pg/mL — ABNORMAL HIGH (ref 211–911)

## 2021-04-04 LAB — PSA: PSA: 1.55 ng/mL (ref 0.10–4.00)

## 2021-05-22 ENCOUNTER — Other Ambulatory Visit: Payer: Self-pay

## 2021-05-22 MED ORDER — ATORVASTATIN CALCIUM 80 MG PO TABS: 80.0000 mg | ORAL_TABLET | Freq: Every day | ORAL | 1 refills | Status: DC

## 2021-05-22 NOTE — Telephone Encounter (Signed)
*  STAT* If patient is at the pharmacy, call can be transferred to refill team.   1. Which medications need to be refilled? (please list name of each medication and dose if known) Atorvastatin  2. Which pharmacy/location (including street and city if local pharmacy) is medication to be sent to? [facility]  3. Do they need a 30 day or 90 day supply? [place]

## 2021-05-22 NOTE — Telephone Encounter (Signed)
Received refill request for Atorvastatin, this medication was prescribed by another provider. If ok to refill please send to pharmacy on file per patient request. Thank you

## 2021-06-28 ENCOUNTER — Ambulatory Visit (INDEPENDENT_AMBULATORY_CARE_PROVIDER_SITE_OTHER): Payer: BC Managed Care – PPO | Admitting: Family Medicine

## 2021-06-28 ENCOUNTER — Encounter: Payer: Self-pay | Admitting: Family Medicine

## 2021-06-28 VITALS — BP 126/68 | HR 69 | Temp 97.2°F | Ht 69.0 in | Wt 185.8 lb

## 2021-06-28 DIAGNOSIS — J069 Acute upper respiratory infection, unspecified: Secondary | ICD-10-CM | POA: Diagnosis not present

## 2021-06-28 DIAGNOSIS — J4521 Mild intermittent asthma with (acute) exacerbation: Secondary | ICD-10-CM | POA: Diagnosis not present

## 2021-06-28 MED ORDER — PREDNISONE 20 MG PO TABS: 20.0000 mg | ORAL_TABLET | Freq: Two times a day (BID) | ORAL | 0 refills | Status: AC

## 2021-06-28 NOTE — Progress Notes (Signed)
? ?Established Patient Office Visit ? ?Subjective:  ?Patient ID: Hayden Deleon, male    DOB: 1956-03-31  Age: 66 y.o. MRN: [id number] ? ?CC:  ?Chief Complaint  ?Patient presents with  ? Cough  ?  Cough, congestion, stuffy nose low grade temp x 2 days.   ? ? ?HPI ?Hayden Deleon presents for 1 day history of URI signs and symptoms with nasal congestion, postnasal drip and cough.  Denies headache, fevers chills, sore throat, nausea or vomiting.  History of COPD.  Continues with Trelegy.  Continues to do well after his CABG surgery this past August.  ? ?Past Medical History:  ?Diagnosis Date  ? Allergy   ? Anxiety   ? Arthritis   ? right shoulder  ? CAD (coronary artery disease)   ? a. 2003 Cath: OM1 100%, good collats, RCA 60-70p-->med rx; b. 04/2020 MV: mild ischemia to sm area of mid inflat & apical lat segments. EF >65%; d. 10/2020 Cath: LM 85ost, LAD 30p/m, OM1 100, RCA 99p/m; e. 10/2020 CABG x 3: LIMA->LAD, VG->RPDA, VG->OM2.  ? Carotid artery disease ([place])   ? a. 03/2020 Carotid U/S: Hay[place] 1-77%, LICA 93-90%.  ? COPD (chronic obstructive pulmonary disease) ([place])   ? Significant Dr. Lamonte Sakai  ? Diastolic dysfunction   ? a. 03/2018 Echo: EF 60-65%, no rwma, Gr1 DD. Mildly dil LA. Nl RV fxn.  ? Dilation of ascending aorta and aortic root ([place])   ? a. 10/2020 Echo: Asc Ao 34m.  ? Diverticulosis of colon   ? GERD (gastroesophageal reflux disease)   ? Gout   ? Hyperlipidemia   ? Hypertension   ? LVH (left ventricular hypertrophy)   ? a. 10/2020 Echo: EF 70-75%, no rwma, mod LVH. Nl RV size/fxn. Mildly dil RA. Triv MR. Asc Ao 489m  ? Prostatitis   ? Sleep apnea   ? uses cpap nightly  ? ? ?Past Surgical History:  ?Procedure Laterality Date  ? COLONOSCOPY  12/2003  ? patterson - polyp  ? CORONARY ARTERY BYPASS GRAFT N/A 11/05/2020  ? Procedure: CORONARY ARTERY BYPASS GRAFTING (CABG) TIMES 3, USING LEFT INTERNAL MAMMARY ARTERY AND GREATER SAPHEOUS VEIN HARVESTED ENDOSCOPICALLY LEFT AND RIGHT LEGS;  Surgeon: LiLajuana MatteMD;  [facility] Service: [facility];  Laterality: N/A;  ? deviated septum    ? KNEE ARTHROSCOPY    ? right  ? LEFT HEART CATH AND CORONARY ANGIOGRAPHY N/A 11/03/2020  ? Procedure: LEFT HEART CATH AND CORONARY ANGIOGRAPHY;  Surgeon: ArWellington HampshireMD;  [facility];  Service: Cardiovascular;  Laterality: N/A;  ? TOTAL SHOULDER ARTHROPLASTY Right 02/03/2018  ? Procedure: RIGHT REVERSE SHOULDER ARTHROPLASTY;  Surgeon: DeMeredith PelMD;  [facility] Service: Orthopedics;  Laterality: Right;  ? WISDOM TOOTH EXTRACTION    ? ? ?Family History  ?Problem Relation Age of Onset  ? Angina Mother   ?     car accident  ? Asthma Father   ? Crohn's disease Sister   ? Colon cancer Neg Hx   ? Esophageal cancer Neg Hx   ? Rectal cancer Neg Hx   ? Stomach cancer Neg Hx   ? ? ?Social History  ? ?Socioeconomic History  ? Marital status: Married  ?  Spouse name: Not on file  ? Number of children: 0  ? Years of education: Not on file  ? Highest education level: Not on file  ?Occupational History  ? 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  ?  Employer: [employer]  ?Tobacco Use  ? Smoking status: Former  ?  Packs/day: 2.00  ?  Years: 32.00  ?  Pack years: 64.00  ?  Types: Cigarettes  ?  Quit date: 08/31/1999  ?  Years since quitting: 21.8  ? Smokeless tobacco: Former  ?Vaping Use  ? Vaping Use: Never used  ?Substance and Sexual Activity  ? Alcohol use: No  ?  Alcohol/week: 0.0 standard drinks  ? Drug use: No  ? Sexual activity: Yes  ?Other Topics Concern  ? Not on file  ?Social History Narrative  ? Not on file  ? ?Social Determinants of Health  ? ?Financial Resource Strain: Not on file  ?Food Insecurity: Not on file  ?Transportation Needs: Not on file  ?Physical Activity: Not on file  ?Stress: Not on file  ?Social Connections: Not on file  ?Intimate Partner Violence: Not on file  ? ? ?Outpatient Medications Prior to Visit  ?Medication Sig Dispense Refill  ? albuterol (VENTOLIN HFA)  108 (90 Base) MCG/ACT inhaler TAKE 2 PUFFS BY MOUTH EVERY 6 HOURS AS NEEDED FOR WHEEZE OR SHORTNESS OF BREATH 6.7 each 10  ? aspirin EC 325 MG EC tablet Take 1 tablet (325 mg total) by mouth daily. 30 tablet 0  ? atorvastatin (LIPITOR) 80 MG tablet Take 1 tablet (80 mg total) by mouth daily. 90 tablet 1  ? Cyanocobalamin (B-12) 3000 MCG SUBL Place 3,000 mcg under the tongue daily.    ? Multiple Vitamin (MULTIVITAMIN) capsule Take 1 capsule by mouth daily.    ? sertraline (ZOLOFT) 50 MG tablet Take 1 tablet (50 mg total) by mouth daily. 90 tablet 1  ? TRELEGY ELLIPTA 100-62.5-25 MCG/ACT AEPB INHALE 1 PUFF BY MOUTH EVERY DAY 180 each 2  ? ?No facility-administered medications prior to visit.  ? ? ?Allergies  ?Allergen Reactions  ? Avelox [Moxifloxacin Hcl In Nacl] Other (See Comments)  ?  Facial swelling  ? Sulfamethoxazole-Trimethoprim Other (See Comments)  ?  REACTION: sore mouth  ? Imdur [Isosorbide Nitrate] Other (See Comments)  ?  Made pt feel groggy, loopy, bad dreams   ? Aspirin Other (See Comments)  ?  REACTION: Nausea; fine with '81mg'$   ? ? ?ROS ?Review of Systems  ?Constitutional:  Negative for chills, fatigue, fever and unexpected weight change.  ?HENT:  Positive for congestion and postnasal drip. Negative for sore throat.   ?Eyes:  Negative for photophobia and visual disturbance.  ?Respiratory:  Positive for cough. Negative for shortness of breath and wheezing.   ?Cardiovascular: Negative.   ?Gastrointestinal:  Negative for nausea and vomiting.  ?Genitourinary: Negative.   ?Musculoskeletal:  Negative for arthralgias and myalgias.  ?Neurological:  Negative for headaches.  ? ?  ?Objective:  ?  ?Physical Exam ?Vitals and nursing note reviewed.  ?Constitutional:   ?   General: He is not in acute distress. ?   Appearance: Normal appearance. He is not ill-appearing, toxic-appearing or diaphoretic.  ?HENT:  ?   Head: Normocephalic and atraumatic.  ?   Right Ear: Tympanic membrane, ear canal and external ear normal.   ?   Left Ear: Tympanic membrane, ear canal and external ear normal.  ?   Mouth/Throat:  ?   Mouth: Mucous membranes are moist.  ?   Pharynx: Oropharynx is clear. No oropharyngeal exudate or posterior oropharyngeal erythema.  ?Eyes:  ?   General: No scleral icterus.    ?   Right eye: No discharge.     ?  Left eye: No discharge.  ?   Extraocular Movements: Extraocular movements intact.  ?   Conjunctiva/sclera: Conjunctivae normal.  ?   Pupils: Pupils are equal, round, and reactive to light.  ?Cardiovascular:  ?   Rate and Rhythm: Normal rate and regular rhythm.  ?Pulmonary:  ?   Effort: Pulmonary effort is normal.  ?   Breath sounds: Decreased air movement present. No wheezing, rhonchi or rales.  ?Abdominal:  ?   General: Bowel sounds are normal.  ?Musculoskeletal:  ?   Cervical back: Normal range of motion and neck supple.  ?Skin: ?   General: Skin is warm and dry.  ?Neurological:  ?   Mental Status: He is alert and oriented to person, place, and time.  ? ? ?BP 126/68 (BP Location: Right Arm, Patient Position: Sitting, Cuff Size: Normal)   Pulse 69   Temp (!) 97.2 ?F (36.2 ?C) (Temporal)   Ht '5\' 9"'$  (1.753 m)   Wt 185 lb 12.8 oz (84.3 kg)   SpO2 94%   BMI 27.44 kg/m?  ?Wt Readings from Last 3 Encounters:  ?06/28/21 185 lb 12.8 oz (84.3 kg)  ?03/30/21 191 lb 3.2 oz (86.7 kg)  ?02/28/21 187 lb 11.2 oz (85.1 kg)  ? ? ? ?There are no preventive care reminders to display for this patient. ? ?There are no preventive care reminders to display for this patient. ? ?Lab Results  ?Component Value Date  ? TSH 1.36 12/16/2019  ? ?Lab Results  ?Component Value Date  ? WBC 6.8 04/04/2021  ? HGB 15.2 04/04/2021  ? HCT 45.9 04/04/2021  ? MCV 93.3 04/04/2021  ? PLT 168.0 04/04/2021  ? ?Lab Results  ?Component Value Date  ? NA 141 04/04/2021  ? K 4.3 04/04/2021  ? CO2 28 04/04/2021  ? GLUCOSE 85 04/04/2021  ? BUN 14 04/04/2021  ? CREATININE 0.95 04/04/2021  ? BILITOT 0.8 04/04/2021  ? ALKPHOS 63 04/04/2021  ? AST 15 04/04/2021   ? ALT 14 04/04/2021  ? PROT 6.4 04/04/2021  ? ALBUMIN 4.1 04/04/2021  ? CALCIUM 9.3 04/04/2021  ? ANIONGAP 8 11/10/2020  ? GFR 84.10 04/04/2021  ? ?Lab Results  ?Component Value Date  ? CHOL 125 04/04/18

## 2021-06-29 LAB — NOVEL CORONAVIRUS, NAA: SARS-CoV-2, NAA: DETECTED — AB

## 2021-07-02 ENCOUNTER — Encounter: Payer: Self-pay | Admitting: Family Medicine

## 2021-07-02 DIAGNOSIS — J22 Unspecified acute lower respiratory infection: Secondary | ICD-10-CM

## 2021-07-02 MED ORDER — DOXYCYCLINE HYCLATE 100 MG PO TABS: 100.0000 mg | ORAL_TABLET | Freq: Two times a day (BID) | ORAL | 0 refills | Status: AC

## 2021-07-27 ENCOUNTER — Encounter: Payer: Self-pay | Admitting: Nurse Practitioner

## 2021-07-27 ENCOUNTER — Ambulatory Visit (INDEPENDENT_AMBULATORY_CARE_PROVIDER_SITE_OTHER): Payer: BC Managed Care – PPO | Admitting: Nurse Practitioner

## 2021-07-27 VITALS — BP 124/62 | HR 67 | Ht 71.0 in | Wt 189.0 lb

## 2021-07-27 DIAGNOSIS — I6523 Occlusion and stenosis of bilateral carotid arteries: Secondary | ICD-10-CM | POA: Diagnosis not present

## 2021-07-27 DIAGNOSIS — I2510 Atherosclerotic heart disease of native coronary artery without angina pectoris: Secondary | ICD-10-CM | POA: Diagnosis not present

## 2021-07-27 DIAGNOSIS — G4733 Obstructive sleep apnea (adult) (pediatric): Secondary | ICD-10-CM | POA: Diagnosis not present

## 2021-07-27 DIAGNOSIS — E785 Hyperlipidemia, unspecified: Secondary | ICD-10-CM

## 2021-07-27 NOTE — Progress Notes (Signed)
? ? ?Office Visit  ?  ?Patient Name: Hayden Deleon ?Date of Encounter: 07/27/2021 ? ?Primary Care Provider:  Libby Maw, MD ?Primary Cardiologist:  [name], MD ? ?Chief Complaint  ? ?66 year old male with a history of CAD status post CABG x3 in August 2022, hypertension, hyperlipidemia, COPD, sleep apnea, and carotid arterial disease, who presents for follow-up related to CAD. ? ?Past Medical History  ?  ?Past Medical History:  ?Diagnosis Date  ? Allergy   ? Anxiety   ? Arthritis   ? right shoulder  ? CAD (coronary artery disease)   ? a. 2003 Cath: OM1 100%, good collats, RCA 60-70p-->med rx; b. 04/2020 MV: mild ischemia to sm area of mid inflat & apical lat segments. EF >65%; d. 10/2020 Cath: LM 85ost, LAD 30p/m, OM1 100, RCA 99p/m; e. 10/2020 CABG x 3: LIMA->LAD, VG->RPDA, VG->OM2.  ? Carotid artery disease ([place])   ? a. 03/2020 Carotid U/S: RICA 5-72%, LICA 62-03%; b. 55/9741 U/S: 40-59% bilat ICA stenoses.  ? COPD (chronic obstructive pulmonary disease) ([place])   ? Significant Dr. Lamonte Sakai  ? Diastolic dysfunction   ? a. 03/2018 Echo: EF 60-65%, no rwma, Gr1 DD. Mildly dil LA. Nl RV fxn.  ? Dilation of ascending aorta and aortic root ([place])   ? a. 10/2020 Echo: Asc Ao 74m.  ? Diverticulosis of colon   ? GERD (gastroesophageal reflux disease)   ? Gout   ? Hyperlipidemia   ? Hypertension   ? LVH (left ventricular hypertrophy)   ? a. 10/2020 Echo: EF 70-75%, no rwma, mod LVH. Nl RV size/fxn. Mildly dil RA. Triv MR. Asc Ao 431m  ? Prostatitis   ? Sleep apnea   ? uses cpap nightly  ? ?Past Surgical History:  ?Procedure Laterality Date  ? COLONOSCOPY  12/2003  ? patterson - polyp  ? CORONARY ARTERY BYPASS GRAFT N/A 11/05/2020  ? Procedure: CORONARY ARTERY BYPASS GRAFTING (CABG) TIMES 3, USING LEFT INTERNAL MAMMARY ARTERY AND GREATER SAPHEOUS VEIN HARVESTED ENDOSCOPICALLY LEFT AND RIGHT LEGS;  Surgeon: LiLajuana MatteMD;  [facility];  Laterality: N/A;  ? deviated  septum    ? KNEE ARTHROSCOPY    ? right  ? LEFT HEART CATH AND CORONARY ANGIOGRAPHY N/A 11/03/2020  ? Procedure: LEFT HEART CATH AND CORONARY ANGIOGRAPHY;  Surgeon: ArWellington HampshireMD;  [facility];  Service: Cardiovascular;  Laterality: N/A;  ? TOTAL SHOULDER ARTHROPLASTY Right 02/03/2018  ? Procedure: RIGHT REVERSE SHOULDER ARTHROPLASTY;  Surgeon: DeMeredith PelMD;  [facility];  Laterality: Right;  ? WISDOM TOOTH EXTRACTION    ? ? ?Allergies ? ?Allergies  ?Allergen Reactions  ? Avelox [Moxifloxacin Hcl In Nacl] Other (See Comments)  ?  Facial swelling  ? Sulfamethoxazole-Trimethoprim Other (See Comments)  ?  REACTION: sore mouth  ? Imdur [Isosorbide Nitrate] Other (See Comments)  ?  Made pt feel groggy, loopy, bad dreams   ? Aspirin Other (See Comments)  ?  REACTION: Nausea; fine with '81mg'$   ? ? ?History of Present Illness  ?  ?[age]ear old male with the above past medical history including CAD, hypertension, hyperlipidemia, COPD, sleep apnea, and carotid arterial disease.  Diagnostic catheterization in 2003 showed an occluded OM1 with collaterals, as well as a 60 to 70% proximal RCA stenosis.  EF was normal.  He had a prolonged history of stable angina with mild chest pain with overexertion.  Stress testing in January  2022, showed a small area of mild ischemia in the inferolateral apical lateral segments.  EF was 65%.  CT imaging showed coronary calcifications and aortic atherosclerosis.  He was also noted to have bilateral groundglass opacities with more focal consolidation versus scarring in the left lower lobe with recommendation for dedicated CT if clinically appropriate.  Due to progressive symptoms, he underwent diagnostic catheterization in August 2022, showing an 85% ostial left main stenosis, occluded OM1, and 99% proximal and mid RCA stenoses.  He was transferred to [facility] and underwent CABG x3 with a LIMA to the LAD, vein graft to the PDA, and  vein graft to the OM 2. ? ?He was last seen in cardiology clinic on October 28, at which time he was participating in cardiac rehab and had lost about 30 pounds.  Follow-up carotid ultrasound in December 2022 showed 40 to 59% bilateral internal carotid artery stenoses.  Since his last visit, he has continued to do well.  He has since finished cardiac rehab and is exercising at home.  He usually walks on the treadmill for about 40 minutes at 2.5 mph and a 3% incline.  He follows this with some light weight lifting, mainly working on his arms.  He is only eating red meat about once or twice a month, and mainly eating baked or boiled chicken with lots of vegetables.  Since his bypass surgery, he is down almost 40 pounds, and has been able to maintain this over the past several months.  He notes overall feeling well.  He denies chest pain, dyspnea, palpitations, PND, orthopnea, dizziness, syncope, edema, or early satiety. ? ?Home Medications  ?  ?Current Outpatient Medications  ?Medication Sig Dispense Refill  ? albuterol (VENTOLIN HFA) 108 (90 Base) MCG/ACT inhaler TAKE 2 PUFFS BY MOUTH EVERY 6 HOURS AS NEEDED FOR WHEEZE OR SHORTNESS OF BREATH 6.7 each 10  ? aspirin EC 325 MG EC tablet Take 1 tablet (325 mg total) by mouth daily. 30 tablet 0  ? atorvastatin (LIPITOR) 80 MG tablet Take 1 tablet (80 mg total) by mouth daily. 90 tablet 1  ? Cyanocobalamin (B-12) 3000 MCG SUBL Place 3,000 mcg under the tongue daily.    ? Multiple Vitamin (MULTIVITAMIN) capsule Take 1 capsule by mouth daily.    ? sertraline (ZOLOFT) 50 MG tablet Take 1 tablet (50 mg total) by mouth daily. 90 tablet 1  ? TRELEGY ELLIPTA 100-62.5-25 MCG/ACT AEPB INHALE 1 PUFF BY MOUTH EVERY DAY 180 each 2  ? ?No current facility-administered medications for this visit.  ?  ? ?Review of Systems  ?  ?Doing exceptionally well.  He denies chest pain, palpitations, dyspnea, pnd, orthopnea, n, v, dizziness, syncope, edema, weight gain, or early satiety.  All other  systems reviewed and are otherwise negative except as noted above. ?  ? ?Physical Exam  ?  ?VS:  BP 124/62 (BP Location: Left Arm, Patient Position: Sitting, Cuff Size: Normal)   Pulse 67   Ht '5\' 11"'$  (1.803 m)   Wt 189 lb (85.7 kg)   SpO2 96%   BMI 26.36 kg/m?  , BMI Body mass index is 26.36 kg/m?. ?    ?GEN: Well nourished, well developed, in no acute distress. ?HEENT: normal. ?Neck: Supple, no JVD, carotid bruits, or masses. ?Cardiac: RRR, no murmurs, rubs, or gallops. No clubbing, cyanosis, edema.  Radials/PT 2+ and equal bilaterally.  ?Respiratory:  Respirations regular and unlabored, clear to auscultation bilaterally. ?GI: Soft, nontender, nondistended, BS + x 4. ?MS:  no deformity or atrophy. ?Skin: warm and dry, no rash. ?Neuro:  Strength and sensation are intact. ?Psych: Normal affect. ? ?Accessory Clinical Findings  ?  ?ECG personally reviewed by me today -regular sinus rhythm, 67- no acute changes. ? ?Lab Results  ?Component Value Date  ? WBC 6.8 04/04/2021  ? HGB 15.2 04/04/2021  ? HCT 45.9 04/04/2021  ? MCV 93.3 04/04/2021  ? PLT 168.0 04/04/2021  ? ?Lab Results  ?Component Value Date  ? CREATININE 0.95 04/04/2021  ? BUN 14 04/04/2021  ? NA 141 04/04/2021  ? K 4.3 04/04/2021  ? CL 105 04/04/2021  ? CO2 28 04/04/2021  ? ?Lab Results  ?Component Value Date  ? ALT 14 04/04/2021  ? AST 15 04/04/2021  ? ALKPHOS 63 04/04/2021  ? BILITOT 0.8 04/04/2021  ? ?Lab Results  ?Component Value Date  ? CHOL 125 04/04/2021  ? HDL 43.50 04/04/2021  ? [place] 59 04/04/2021  ? LDLDIRECT 71.0 01/03/2016  ? TRIG 111.0 04/04/2021  ? CHOLHDL 3 04/04/2021  ?  ?Lab Results  ?Component Value Date  ? HGBA1C 5.6 11/07/2020  ? ? ?Assessment & Plan  ?  ?1.  Coronary artery disease: Status post CABG x3 in August 2022 with a LIMA to the LAD, vein graft to the OM 2, and vein graft to the RPDA.  He has completed cardiac rehab and is exercising regularly at home, typically walking for 40 minutes on his treadmill and doing some light  weightlifting, without symptoms or limitations.  He remains on aspirin and statin therapy. ? ?2.  Essential hypertension: Blood pressure stable at 124/62 today.  He is not currently on any antihypertensives and hi

## 2021-07-27 NOTE — Patient Instructions (Signed)
Medication Instructions:  No changes at this time.   *If you need a refill on your cardiac medications before your next appointment, please call your pharmacy*   Lab Work: None  If you have labs (blood work) drawn today and your tests are completely normal, you will receive your results only by: MyChart Message (if you have MyChart) OR A paper copy in the mail If you have any lab test that is abnormal or we need to change your treatment, we will call you to review the results.   Testing/Procedures: None   Follow-Up: At [facility], you and your health needs are our priority.  As part of our continuing mission to provide you with exceptional heart care, we have created designated Provider Care Teams.  These Care Teams include your primary Cardiologist (physician) and Advanced Practice Providers (APPs -  Physician Assistants and Nurse Practitioners) who all work together to provide you with the care you need, when you need it.   Your next appointment:   6 month(s)  The format for your next appointment:   In Person  Provider:   Muhammad Arida, MD or Christopher Berge, NP    Important Information About Sugar       

## 2021-08-17 ENCOUNTER — Ambulatory Visit (INDEPENDENT_AMBULATORY_CARE_PROVIDER_SITE_OTHER): Payer: BC Managed Care – PPO

## 2021-08-17 ENCOUNTER — Ambulatory Visit (INDEPENDENT_AMBULATORY_CARE_PROVIDER_SITE_OTHER): Payer: BC Managed Care – PPO | Admitting: Family Medicine

## 2021-08-17 ENCOUNTER — Encounter: Payer: Self-pay | Admitting: Family Medicine

## 2021-08-17 VITALS — BP 124/68 | HR 75 | Temp 97.1°F | Ht 71.0 in | Wt 191.6 lb

## 2021-08-17 DIAGNOSIS — I6523 Occlusion and stenosis of bilateral carotid arteries: Secondary | ICD-10-CM | POA: Diagnosis not present

## 2021-08-17 DIAGNOSIS — J22 Unspecified acute lower respiratory infection: Secondary | ICD-10-CM

## 2021-08-17 DIAGNOSIS — J811 Chronic pulmonary edema: Secondary | ICD-10-CM | POA: Diagnosis not present

## 2021-08-17 DIAGNOSIS — J439 Emphysema, unspecified: Secondary | ICD-10-CM

## 2021-08-17 DIAGNOSIS — R059 Cough, unspecified: Secondary | ICD-10-CM | POA: Diagnosis not present

## 2021-08-17 MED ORDER — DOXYCYCLINE HYCLATE 100 MG PO TABS: 100.0000 mg | ORAL_TABLET | Freq: Two times a day (BID) | ORAL | 0 refills | Status: AC

## 2021-08-17 MED ORDER — PREDNISONE 20 MG PO TABS: 20.0000 mg | ORAL_TABLET | Freq: Two times a day (BID) | ORAL | 0 refills | Status: AC

## 2021-08-17 NOTE — Progress Notes (Signed)
Established Patient Office Visit  Subjective   Patient ID: Hayden Deleon, male    DOB: 1955/11/06  Age: 66 y.o. MRN: [id number]  Chief Complaint  Patient presents with   chest congestion    Chest congestion with cough worse at night.     HPI ongoing cough with chest congestion since COVID infection diagnosed on March 30.  Cough productive of beige phlegm.  Denies fevers chills or increased difficulty breathing.  Continues with Trelegy and albuterol as needed.  Denies obvious postnasal drip.  Has no history of GERD.  Significant history of COPD.  Quit tobacco 22 years ago.    Review of Systems  Constitutional: Negative.  Negative for chills, diaphoresis, fever, malaise/fatigue and weight loss.  HENT: Negative.    Eyes:  Negative for blurred vision, discharge and redness.  Respiratory:  Positive for cough and sputum production. Negative for hemoptysis, shortness of breath and wheezing.   Cardiovascular: Negative.   Gastrointestinal:  Negative for abdominal pain.  Genitourinary: Negative.   Musculoskeletal: Negative.  Negative for myalgias.  Skin:  Negative for rash.  Neurological:  Negative for tingling, loss of consciousness and weakness.  Endo/Heme/Allergies:  Negative for polydipsia.     Objective:     BP 124/68 (BP Location: Right Arm, Patient Position: Sitting, Cuff Size: Large)   Pulse 75   Temp (!) 97.1 F (36.2 C) (Temporal)   Ht '5\' 11"'$  (1.803 m)   Wt 191 lb 9.6 oz (86.9 kg)   SpO2 94%   BMI 26.72 kg/m  Wt Readings from Last 3 Encounters:  08/17/21 191 lb 9.6 oz (86.9 kg)  07/27/21 189 lb (85.7 kg)  06/28/21 185 lb 12.8 oz (84.3 kg)      Physical Exam Constitutional:      General: He is not in acute distress.    Appearance: Normal appearance. He is not ill-appearing, toxic-appearing or diaphoretic.  HENT:     Head: Normocephalic and atraumatic.     Right Ear: External ear normal.     Left Ear: External ear normal.     Mouth/Throat:     Mouth: Mucous  membranes are moist.     Pharynx: Oropharynx is clear. No oropharyngeal exudate or posterior oropharyngeal erythema.  Eyes:     General: No scleral icterus.       Right eye: No discharge.        Left eye: No discharge.     Extraocular Movements: Extraocular movements intact.     Conjunctiva/sclera: Conjunctivae normal.     Pupils: Pupils are equal, round, and reactive to light.  Cardiovascular:     Rate and Rhythm: Normal rate and regular rhythm.     Pulses:          Carotid pulses are 1+ on the right side and 1+ on the left side.    Heart sounds: Heart sounds are distant.  Pulmonary:     Effort: Pulmonary effort is normal. No accessory muscle usage or respiratory distress.     Breath sounds: Decreased air movement present. Examination of the left-lower field reveals rhonchi and rales. Decreased breath sounds, rhonchi and rales present. No wheezing.     Comments: Rubs bilateral Mid to Lower lung fields.  Abdominal:     General: Bowel sounds are normal.  Musculoskeletal:     Cervical back: No rigidity or tenderness.  Skin:    General: Skin is warm and dry.  Neurological:     Mental Status: He is alert and oriented  to person, place, and time.  Psychiatric:        Mood and Affect: Mood normal.        Behavior: Behavior normal.     No results found for any visits on 08/17/21.    The ASCVD Risk score (Arnett DK, et al., 2019) failed to calculate for the following reasons:   The valid total cholesterol range is 130 to 320 mg/dL    Assessment & Plan:   Problem List Items Addressed This Visit       Cardiovascular and Mediastinum   Carotid artery disease ([place])   Other Visit Diagnoses     Lower respiratory tract infection    -  Primary   Relevant Medications   predniSONE (DELTASONE) 20 MG tablet   doxycycline (VIBRA-TABS) 100 MG tablet   Other Relevant Orders   DG Chest 2 View   Pulmonary emphysema, unspecified emphysema type ([place])       Relevant Medications    predniSONE (DELTASONE) 20 MG tablet   Other Relevant Orders   DG Chest 2 View       Return if symptoms worsen or fail to improve.   Continue Trelegy.  Continue albuterol as needed.  Consider pulmonary. Libby Maw, MD

## 2021-09-07 ENCOUNTER — Other Ambulatory Visit: Payer: Self-pay | Admitting: Family Medicine

## 2021-09-13 ENCOUNTER — Encounter: Payer: Self-pay | Admitting: Gastroenterology

## 2021-09-24 ENCOUNTER — Telehealth: Payer: Self-pay | Admitting: *Deleted

## 2021-09-25 NOTE — Telephone Encounter (Signed)
Sheila,  This pt is cleared for anesthetic care at [facility].  Thanks,  Junnie Loschiavo 

## 2021-09-25 NOTE — Telephone Encounter (Signed)
Noted  

## 2021-09-28 ENCOUNTER — Ambulatory Visit: Payer: BC Managed Care – PPO | Admitting: Family Medicine

## 2021-10-11 ENCOUNTER — Ambulatory Visit (AMBULATORY_SURGERY_CENTER): Payer: Self-pay

## 2021-10-11 ENCOUNTER — Other Ambulatory Visit: Payer: Self-pay

## 2021-10-11 VITALS — Ht 71.0 in | Wt 195.0 lb

## 2021-10-11 DIAGNOSIS — Z86010 Personal history of colonic polyps: Secondary | ICD-10-CM

## 2021-10-11 MED ORDER — NA SULFATE-K SULFATE-MG SULF 17.5-3.13-1.6 GM/177ML PO SOLN: 1.0000 | Freq: Once | ORAL | 0 refills | Status: AC

## 2021-10-11 NOTE — Progress Notes (Signed)
Denies allergies to eggs or soy products. Denies complication of anesthesia or sedation. Denies use of weight loss medication. Denies use of O2.   Emmi instructions given for colonoscopy.  

## 2021-10-16 ENCOUNTER — Encounter: Payer: Self-pay | Admitting: Family Medicine

## 2021-10-16 ENCOUNTER — Ambulatory Visit (INDEPENDENT_AMBULATORY_CARE_PROVIDER_SITE_OTHER): Payer: BC Managed Care – PPO | Admitting: Family Medicine

## 2021-10-16 VITALS — BP 120/68 | HR 65 | Temp 96.7°F | Ht 71.0 in | Wt 192.6 lb

## 2021-10-16 DIAGNOSIS — Z8739 Personal history of other diseases of the musculoskeletal system and connective tissue: Secondary | ICD-10-CM

## 2021-10-16 DIAGNOSIS — I6523 Occlusion and stenosis of bilateral carotid arteries: Secondary | ICD-10-CM

## 2021-10-16 DIAGNOSIS — I25118 Atherosclerotic heart disease of native coronary artery with other forms of angina pectoris: Secondary | ICD-10-CM

## 2021-10-16 DIAGNOSIS — Z951 Presence of aortocoronary bypass graft: Secondary | ICD-10-CM

## 2021-10-16 DIAGNOSIS — F341 Dysthymic disorder: Secondary | ICD-10-CM

## 2021-10-16 LAB — LIPID PANEL
Cholesterol: 129 mg/dL (ref 0–200)
HDL: 50.8 mg/dL (ref >39.00)
LDL Cholesterol: 51 mg/dL (ref 0–99)
NonHDL: 78.54
Total CHOL/HDL Ratio: 3
Triglycerides: 140 mg/dL (ref 0.0–149.0)
VLDL: 28 mg/dL (ref 0.0–40.0)

## 2021-10-16 LAB — URIC ACID: Uric Acid, Serum: 6.5 mg/dL (ref 4.0–7.8)

## 2021-10-16 LAB — BASIC METABOLIC PANEL
BUN: 14 mg/dL (ref 6–23)
CO2: 27 mEq/L (ref 19–32)
Calcium: 9.8 mg/dL (ref 8.4–10.5)
Chloride: 106 mEq/L (ref 96–112)
Creatinine, Ser: 0.94 mg/dL (ref 0.40–1.50)
GFR: 84.86 mL/min (ref >60.00)
Glucose, Bld: 81 mg/dL (ref 70–99)
Potassium: 4.2 mEq/L (ref 3.5–5.1)
Sodium: 140 mEq/L (ref 135–145)

## 2021-10-16 MED ORDER — SERTRALINE HCL 50 MG PO TABS: 50.0000 mg | ORAL_TABLET | Freq: Every day | ORAL | 1 refills | Status: DC

## 2021-10-16 NOTE — Progress Notes (Signed)
   Established Patient Office Visit  Subjective   Patient ID: Hayden Deleon, male    DOB: 1955-04-28  Age: 66 y.o. MRN: [id number]  Chief Complaint  Patient presents with   Office Visit    6 month f/u No concerns today     HPI follow-up of vascular disease status post CABG with history of carotid artery disease.  Continues high-dose atorvastatin.  Follow-up carotid Dopplers are planned in October.  Continue Zoloft.  He has been on it for years and years.  He would like to continue it.  Denies any feelings of anxiety or depression.  Continues to work full-time.  Plans on retiring at age 58.  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  Colonoscopy planned this summer.    ROS    Objective:     BP 120/68 (BP Location: Right Arm, Patient Position: Sitting, Cuff Size: Normal)   Pulse 65   Temp (!) 96.7 F (35.9 C) (Temporal)   Ht '5\' 11"'$  (1.803 m)   Wt 192 lb 9.6 oz (87.4 kg)   SpO2 93%   BMI 26.86 kg/m  Wt Readings from Last 3 Encounters:  10/16/21 192 lb 9.6 oz (87.4 kg)  10/11/21 195 lb (88.5 kg)  08/17/21 191 lb 9.6 oz (86.9 kg)      Physical Exam   No results found for any visits on 10/16/21.    The ASCVD Risk score (Arnett DK, et al., 2019) failed to calculate for the following reasons:   The valid total cholesterol range is 130 to 320 mg/dL    Assessment & Plan:   Problem List Items Addressed This Visit       Cardiovascular and Mediastinum   Coronary artery disease of native artery of native heart with stable angina pectoris (HCC)   Relevant Medications   sertraline (ZOLOFT) 50 MG tablet   Other Relevant Orders   Lipid panel   Bilateral carotid artery stenosis - Primary   Relevant Orders   Lipid panel     Other   History of gout   Relevant Orders   Uric acid   S/P CABG x 3   Relevant Orders   Basic metabolic panel   Lipid panel   Dysthymia   Relevant Medications   sertraline (ZOLOFT) 50 MG tablet    Return in about 6 months (around 04/18/2022).   Follow-up in January for physical.  Hold B12 and just take multivitamin.  We will recheck in January.  Continue all other medications including sertraline it is working well for him.  Libby Maw, MD

## 2021-10-30 ENCOUNTER — Encounter: Payer: Self-pay | Admitting: Family Medicine

## 2021-10-30 ENCOUNTER — Encounter: Payer: Self-pay | Admitting: Gastroenterology

## 2021-10-31 NOTE — Telephone Encounter (Signed)
Please advise message below patient states that congestion have become worse would like something sent in for this.

## 2021-11-01 ENCOUNTER — Ambulatory Visit (INDEPENDENT_AMBULATORY_CARE_PROVIDER_SITE_OTHER): Payer: BC Managed Care – PPO | Admitting: Nurse Practitioner

## 2021-11-01 ENCOUNTER — Ambulatory Visit (AMBULATORY_SURGERY_CENTER): Payer: BC Managed Care – PPO | Admitting: Gastroenterology

## 2021-11-01 ENCOUNTER — Encounter: Payer: Self-pay | Admitting: Nurse Practitioner

## 2021-11-01 ENCOUNTER — Encounter: Payer: Self-pay | Admitting: Gastroenterology

## 2021-11-01 VITALS — BP 122/64 | HR 60 | Temp 98.9°F | Resp 11 | Ht 71.0 in | Wt 195.0 lb

## 2021-11-01 DIAGNOSIS — D122 Benign neoplasm of ascending colon: Secondary | ICD-10-CM | POA: Diagnosis not present

## 2021-11-01 DIAGNOSIS — Z1211 Encounter for screening for malignant neoplasm of colon: Secondary | ICD-10-CM | POA: Diagnosis not present

## 2021-11-01 DIAGNOSIS — Z09 Encounter for follow-up examination after completed treatment for conditions other than malignant neoplasm: Secondary | ICD-10-CM

## 2021-11-01 DIAGNOSIS — J449 Chronic obstructive pulmonary disease, unspecified: Secondary | ICD-10-CM

## 2021-11-01 DIAGNOSIS — Z86010 Personal history of colonic polyps: Secondary | ICD-10-CM | POA: Diagnosis not present

## 2021-11-01 DIAGNOSIS — D126 Benign neoplasm of colon, unspecified: Secondary | ICD-10-CM

## 2021-11-01 MED ORDER — GUAIFENESIN ER 600 MG PO TB12: 600.0000 mg | ORAL_TABLET | Freq: Two times a day (BID) | ORAL | 1 refills | Status: DC

## 2021-11-01 MED ORDER — ALBUTEROL SULFATE HFA 108 (90 BASE) MCG/ACT IN AERS: INHALATION_SPRAY | RESPIRATORY_TRACT | 10 refills | Status: DC

## 2021-11-01 MED ORDER — PREDNISONE 10 MG PO TABS: ORAL_TABLET | ORAL | 0 refills | Status: DC

## 2021-11-01 MED ORDER — SODIUM CHLORIDE 0.9 % IV SOLN: 500.0000 mL | Freq: Once | INTRAVENOUS | Status: DC

## 2021-11-01 NOTE — Patient Instructions (Signed)
It was great to see you!  Start prednisone tomorrow morning. Take 6 pills tomorrow, 5 pills the next day, then keep decreasing by 1 pill every day until gone. Take this food.  Also start mucinex, you can get this over the counter.   Let's follow-up if your symptoms worsen or don't improve.   Take care,  Vance Peper, NP

## 2021-11-01 NOTE — Progress Notes (Signed)
A and O x3. Report to RN. Tolerated MAC anesthesia well. 

## 2021-11-01 NOTE — Patient Instructions (Signed)
Read all of the handouts given to you by your recovery room nurse.  Call your primary care doctor today regarding your coughing and decreased oxygen sats.  PAtien has an appointment at 4:40 pm today with his PCP.  I made the appointment.  Pt did take his inhaler and sats 91% at the moment.  YOU HAD AN ENDOSCOPIC PROCEDURE TODAY AT [facility]:   Refer to the procedure report that was given to you for any specific questions about what was found during the examination.  If the procedure report does not answer your questions, please call your gastroenterologist to clarify.  If you requested that your care partner not be given the details of your procedure findings, then the procedure report has been included in a sealed envelope for you to review at your convenience later.  YOU SHOULD EXPECT: Some feelings of bloating in the abdomen. Passage of more gas than usual.  Walking can help get rid of the air that was put into your GI tract during the procedure and reduce the bloating. If you had a lower endoscopy (such as a colonoscopy or flexible sigmoidoscopy) you may notice spotting of blood in your stool or on the toilet paper. If you underwent a bowel prep for your procedure, you may not have a normal bowel movement for a few days.  Please Note:  You might notice some irritation and congestion in your nose or some drainage.  This is from the oxygen used during your procedure.  There is no need for concern and it should clear up in a day or so.  SYMPTOMS TO REPORT IMMEDIATELY:  Following lower endoscopy (colonoscopy or flexible sigmoidoscopy):  Excessive amounts of blood in the stool  Significant tenderness or worsening of abdominal pains  Swelling of the abdomen that is new, acute  Fever of 100F or higher   For urgent or emergent issues, a gastroenterologist can be reached at any hour by calling [phone]. Do not use MyChart messaging for urgent concerns.    DIET:  We do  recommend a small meal at first, but then you may proceed to your regular diet.  Drink plenty of fluids but you should avoid alcoholic beverages for 24 hours. Try to increase the fiber in your dit, and drink plenty of water.  ACTIVITY:  You should plan to take it easy for the rest of today and you should NOT DRIVE or use heavy machinery until tomorrow (because of the sedation medicines used during the test).    FOLLOW UP: Our staff will call the number listed on your records the next business day following your procedure.  We will call around 7:15- 8:00 am to check on you and address any questions or concerns that you may have regarding the information given to you following your procedure. If we do not reach you, we will leave a message.  If you develop any symptoms (ie: fever, flu-like symptoms, shortness of breath, cough etc.) before then, please call [phone].  If you test positive for Covid 19 in the 2 weeks post procedure, please call and report this information to [place].    If any biopsies were taken you will be contacted by phone or by letter within the next 1-3 weeks.  Please call us at [phone] if you have not heard about the biopsies in 3 weeks.    SIGNATURES/CONFIDENTIALITY: You and/or your care partner have signed paperwork which will be entered into your electronic medical record.  These  signatures attest to the fact that that the information above on your After Visit Summary has been reviewed and is understood.  Full responsibility of the confidentiality of this discharge information lies with you and/or your care-partner.

## 2021-11-01 NOTE — Progress Notes (Signed)
Pt's states no medical or surgical changes since previsit or office visit. 

## 2021-11-01 NOTE — Progress Notes (Signed)
[facility] History and Physical   Primary Care Physician:  Libby Maw, MD   Reason for Procedure:  History of adenomatous colon polyps  Plan:    Surveillance colonoscopy with possible interventions as needed     HPI: Hayden Deleon is a very pleasant 66 y.o. male here for surveillance colonoscopy. Denies any nausea, vomiting, abdominal pain, melena or bright red blood per rectum  The risks and benefits as well as alternatives of endoscopic procedure(s) have been discussed and reviewed. All questions answered. The patient agrees to proceed.    Past Medical History:  Diagnosis Date   Allergy    Anxiety    Arthritis    right shoulder   CAD (coronary artery disease)    a. 2003 Cath: OM1 100%, good collats, RCA 60-70p-->med rx; b. 04/2020 MV: mild ischemia to sm area of mid inflat & apical lat segments. EF >65%; d. 10/2020 Cath: LM 85ost, LAD 30p/m, OM1 100, RCA 99p/m; e. 10/2020 CABG x 3: LIMA->LAD, VG->RPDA, VG->OM2.   Carotid artery disease ([place])    a. 03/2020 Carotid U/S: RICA 0-86%, LICA 76-19%; b. 50/9326 U/S: 40-59% bilat ICA stenoses.   COPD (chronic obstructive pulmonary disease) ([place])    Significant Dr. Lamonte Sakai   Diastolic dysfunction    a. 03/2018 Echo: EF 60-65%, no rwma, Gr1 DD. Mildly dil LA. Nl RV fxn.   Dilation of ascending aorta and aortic root ([place])    a. 10/2020 Echo: Asc Ao 78m.   Diverticulosis of colon    Emphysema of lung ([facility])    GERD (gastroesophageal reflux disease)    Gout    Hyperlipidemia    Hypertension    LVH (left ventricular hypertrophy)    a. 10/2020 Echo: EF 70-75%, no rwma, mod LVH. Nl RV size/fxn. Mildly dil RA. Triv MR. Asc Ao 458m   Prostatitis    Sleep apnea    uses cpap nightly    Past Surgical History:  Procedure Laterality Date   COLONOSCOPY  12/2003   [facility] - polyp   CORONARY ARTERY BYPASS GRAFT N/A 11/05/2020   Procedure: CORONARY ARTERY BYPASS GRAFTING (CABG) TIMES 3, USING LEFT INTERNAL MAMMARY  ARTERY AND GREATER SAPHEOUS VEIN HARVESTED ENDOSCOPICALLY LEFT AND RIGHT LEGS;  Surgeon: LiLajuana MatteMD;  [facility];  Laterality: N/A;   deviated septum     KNEE ARTHROSCOPY     right   LEFT HEART CATH AND CORONARY ANGIOGRAPHY N/A 11/03/2020   Procedure: LEFT HEART CATH AND CORONARY ANGIOGRAPHY;  Surgeon: ArWellington HampshireMD;  [facility];  Service: Cardiovascular;  Laterality: N/A;   TOTAL SHOULDER ARTHROPLASTY Right 02/03/2018   Procedure: RIGHT REVERSE SHOULDER ARTHROPLASTY;  Surgeon: DeMeredith PelMD;  [facility]: [facility]ics;  Laterality: Right;   WISDOM TOOTH EXTRACTION      Prior to Admission medications   Medication Sig Start Date End Date Taking? Authorizing Provider  albuterol (VENTOLIN HFA) 108 (90 Base) MCG/ACT inhaler TAKE 2 PUFFS BY MOUTH EVERY 6 HOURS AS NEEDED FOR WHEEZE OR SHORTNESS OF BREATH 09/05/20  Yes KaFlora LippsMD  aspirin EC 325 MG EC tablet Take 1 tablet (325 mg total) by mouth daily. 11/10/20  Yes Roddenberry, MyArlis PortaPA-C  atorvastatin (LIPITOR) 80 MG tablet Take 1 tablet (80 mg total) by mouth daily. 05/22/21  Yes ArWellington HampshireMD  Multiple Vitamin (MULTIVITAMIN) capsule Take 1 capsule by mouth daily.   Yes [provider]  sertraline (ZOLOFT) 50 MG tablet Take 1 tablet (50 mg total) by mouth daily. 10/16/21  Yes Libby Maw, MD  TRELEGY ELLIPTA 100-62.5-25 MCG/ACT AEPB INHALE 1 PUFF BY MOUTH EVERY DAY 03/03/21   Flora Lipps, MD    Current Outpatient Medications  Medication Sig Dispense Refill   albuterol (VENTOLIN HFA) 108 (90 Base) MCG/ACT inhaler TAKE 2 PUFFS BY MOUTH EVERY 6 HOURS AS NEEDED FOR WHEEZE OR SHORTNESS OF BREATH 6.7 each 10   aspirin EC 325 MG EC tablet Take 1 tablet (325 mg total) by mouth daily. 30 tablet 0   atorvastatin (LIPITOR) 80 MG tablet Take 1 tablet (80 mg total) by mouth daily. 90 tablet 1   Multiple Vitamin (MULTIVITAMIN)  capsule Take 1 capsule by mouth daily.     sertraline (ZOLOFT) 50 MG tablet Take 1 tablet (50 mg total) by mouth daily. 90 tablet 1   TRELEGY ELLIPTA 100-62.5-25 MCG/ACT AEPB INHALE 1 PUFF BY MOUTH EVERY DAY 180 each 2   Current Facility-Administered Medications  Medication Dose Route Frequency Provider Last Rate Last Admin   0.9 %  sodium chloride infusion  500 mL Intravenous Once [name] Pole, MD        Allergies as of 11/01/2021 - Review Complete 11/01/2021  Allergen Reaction Noted   Avelox [moxifloxacin hcl in nacl] Other (See Comments) 06/14/2011   Sulfamethoxazole-trimethoprim Other (See Comments)    Imdur [isosorbide nitrate] Other (See Comments) 10/24/2020   Aspirin Other (See Comments)     Family History  Problem Relation Age of Onset   Angina Mother        car accident   Asthma Father    Crohn's disease Sister    Colon cancer Neg Hx    Esophageal cancer Neg Hx    Rectal cancer Neg Hx    Stomach cancer Neg Hx     Social History   Socioeconomic History   Marital status: Married    Spouse name: Not on file   Number of children: 0   Years of education: Not on file   Highest education level: Not on file  Occupational History   Occupation: Freight forwarder at [employer]    Employer: [employer]  Tobacco Use   Smoking status: Former    Packs/day: 2.00    Years: 32.00    Total pack years: 64.00    Types: Cigarettes    Quit date: 08/31/1999    Years since quitting: 22.1   Smokeless tobacco: Former  Scientific laboratory technician Use: Never used  Substance and Sexual Activity   Alcohol use: No    Alcohol/week: 0.0 standard drinks of alcohol   Drug use: No   Sexual activity: Yes  Other Topics Concern   Not on file  Social History Narrative   Not on file   Social Determinants of Health   Financial Resource Strain: Not on file  Food Insecurity: Not on file  Transportation Needs: Not on file  Physical Activity: Not on file  Stress: Not on file   Social Connections: Not on file  Intimate Partner Violence: Not on file    Review of Systems:  All other review of systems negative except as mentioned in the HPI.  Physical Exam: Vital signs in last 24 hours: BP 139/71   Pulse 70   Temp 98.9 F (37.2 C)   Resp 12   Ht '5\' 11"'$  (1.803 m)   Wt 195 lb (88.5 kg)   SpO2 97%  BMI 27.20 kg/m  General:   Alert, NAD Lungs:  Clear .   Heart:  Regular rate and rhythm Abdomen:  Soft, nontender and nondistended. Neuro/Psych:  Alert and cooperative. Normal mood and affect. A and O x 3  Reviewed labs, radiology imaging, old records and pertinent past GI work up  Patient is appropriate for planned procedure(s) and anesthesia in an ambulatory setting   K. Denzil Magnuson , MD [phone]

## 2021-11-01 NOTE — Progress Notes (Signed)
Acute Office Visit  Subjective:     Patient ID: Hayden Deleon, male    DOB: 1956-01-09, 66 y.o.   MRN: [id number]  Chief Complaint  Patient presents with   Acute Visit    Pt c/o recurrent chest congestion, pt  was recommended to be seen due to coughing up yellow phlegm during procedure.     HPI Patient is in today for chest congestion. He went for a colonoscopy today and states that during the procedure he was coughing frequently and coughing up yellow phlegm. He has been having this congestion for a few days now. He denies fevers, chest pain, and shortness of breath. He does endorse mild nasal congestion and, chest tightness, and wheezing. He uses his albuterol inhaler as needed. He has a history of COPD and follows with pulmonology. He did a covid test at home that was negative.   ROS See pertinent positives and negatives per HPI.     Objective:    BP 135/69   Pulse 70   Wt 190 lb 9.6 oz (86.5 kg)   SpO2 94%   BMI 26.58 kg/m    Physical Exam Vitals and nursing note reviewed.  Constitutional:      Appearance: Normal appearance.  HENT:     Head: Normocephalic.     Right Ear: Tympanic membrane, ear canal and external ear normal.     Left Ear: Tympanic membrane, ear canal and external ear normal.  Eyes:     Conjunctiva/sclera: Conjunctivae normal.  Cardiovascular:     Rate and Rhythm: Normal rate and regular rhythm.     Pulses: Normal pulses.     Heart sounds: Normal heart sounds.  Pulmonary:     Effort: Pulmonary effort is normal.     Breath sounds: Normal breath sounds.  Musculoskeletal:     Cervical back: Normal range of motion and neck supple. No tenderness.  Lymphadenopathy:     Cervical: No cervical adenopathy.  Skin:    General: Skin is warm.  Neurological:     General: No focal deficit present.     Mental Status: He is alert and oriented to person, place, and time.  Psychiatric:        Mood and Affect: Mood normal.        Behavior: Behavior normal.         Thought Content: Thought content normal.        Judgment: Judgment normal.      Assessment & Plan:   Problem List Items Addressed This Visit       Respiratory   COPD (chronic obstructive pulmonary disease) with chronic bronchitis ([place])    Symptoms most consistent with COPD exacerbation. Lung sounds clear on exam today, however having some chest tightness and more sputum production. Will have him start mucinex BID and a prednisone taper. Will also refill his albuterol inhaler. Continue trelegy inhaler as scheduled. Follow-up if symptoms worsen or don't improve.       Relevant Medications   albuterol (VENTOLIN HFA) 108 (90 Base) MCG/ACT inhaler   predniSONE (DELTASONE) 10 MG tablet   guaiFENesin (MUCINEX) 600 MG 12 hr tablet    Meds ordered this encounter  Medications   albuterol (VENTOLIN HFA) 108 (90 Base) MCG/ACT inhaler    Sig: TAKE 2 PUFFS BY MOUTH EVERY 6 HOURS AS NEEDED FOR WHEEZE OR SHORTNESS OF BREATH    Dispense:  6.7 each    Refill:  10   predniSONE (DELTASONE) 10 MG tablet  Sig: Take 6 tablets today, then 5 tablets tomorrow, then decrease by 1 tablet every day until gone    Dispense:  21 tablet    Refill:  0   guaiFENesin (MUCINEX) 600 MG 12 hr tablet    Sig: Take 1 tablet (600 mg total) by mouth 2 (two) times daily.    Dispense:  60 tablet    Refill:  1    Return if symptoms worsen or fail to improve.  Charyl Dancer, NP

## 2021-11-01 NOTE — Op Note (Signed)
[place] Patient Name: Hayden Deleon Procedure Date: 11/01/2021 8:53 AM MRN: [id number] Endoscopist: [name] Pole , MD Age: 66 Referring MD:  Date of Birth: March 28, 1956 Gender: Male Account #: [id number] Procedure:                Colonoscopy Indications:              High risk colon cancer surveillance: Personal                            history of colonic polyps, Surveillance: History of                            numerous (> 10) adenomas on last colonoscopy (< 3                            yrs), High risk colon cancer surveillance: Personal                            history of adenoma (10 mm or greater in size) Medicines:                Monitored Anesthesia Care Procedure:                Pre-Anesthesia Assessment:                           - Prior to the procedure, a History and Physical                            was performed, and patient medications and                            allergies were reviewed. The patient's tolerance of                            previous anesthesia was also reviewed. The risks                            and benefits of the procedure and the sedation                            options and risks were discussed with the patient.                            All questions were answered, and informed consent                            was obtained. Prior Anticoagulants: The patient has                            taken no previous anticoagulant or antiplatelet                            agents. ASA Grade Assessment: III - A patient with  severe systemic disease. After reviewing the risks                            and benefits, the patient was deemed in                            satisfactory condition to undergo the procedure.                           After obtaining informed consent, the colonoscope                            was passed under direct vision. Throughout the                            procedure, the  patient's blood pressure, pulse, and                            oxygen saturations were monitored continuously. The                            0441 PCF-H190TL Slim SB Colonoscope was introduced                            through the anus and advanced to the the cecum,                            identified by appendiceal orifice and ileocecal                            valve. The colonoscopy was performed without                            difficulty. The patient tolerated the procedure                            well. The quality of the bowel preparation was                            adequate. The ileocecal valve, appendiceal orifice,                            and rectum were photographed. Scope In: 9:00:07 AM Scope Out: 9:21:41 AM Scope Withdrawal Time: 0 hours 16 minutes 42 seconds  Total Procedure Duration: 0 hours 21 minutes 34 seconds  Findings:                 The perianal and digital rectal examinations were                            normal.                           Two sessile polyps were found in the ascending  colon. The polyps were 3 to 11 mm in size. These                            polyps were removed with a cold snare. Resection                            and retrieval were complete.                           Scattered small-mouthed diverticula were found in                            the sigmoid colon, descending colon, splenic                            flexure and ascending colon.                           Non-bleeding external and internal hemorrhoids were                            found during retroflexion. The hemorrhoids were                            medium-sized. Complications:            No immediate complications. Estimated Blood Loss:     Estimated blood loss was minimal. Impression:               - Two 3 to 11 mm polyps in the ascending colon,                            removed with a cold snare. Resected and retrieved.                            - Diverticulosis in the sigmoid colon, in the                            descending colon, at the splenic flexure and in the                            ascending colon.                           - Non-bleeding external and internal hemorrhoids. Recommendation:           - Patient has a contact number available for                            emergencies. The signs and symptoms of potential                            delayed complications were discussed with the                            patient.  Return to normal activities tomorrow.                            Written discharge instructions were provided to the                            patient.                           - Resume previous diet.                           - Continue present medications.                           - Await pathology results.                           - Repeat colonoscopy in 3 years for surveillance                            based on pathology results. [name] Pole, MD 11/01/2021 9:27:47 AM This report has been signed electronically.

## 2021-11-01 NOTE — Progress Notes (Signed)
Called to room to assist during endoscopic procedure.  Patient ID and intended procedure confirmed with present staff. Received instructions for my participation in the procedure from the performing physician.  

## 2021-11-02 ENCOUNTER — Telehealth: Payer: Self-pay | Admitting: *Deleted

## 2021-11-02 NOTE — Assessment & Plan Note (Signed)
Symptoms most consistent with COPD exacerbation. Lung sounds clear on exam today, however having some chest tightness and more sputum production. Will have him start mucinex BID and a prednisone taper. Will also refill his albuterol inhaler. Continue trelegy inhaler as scheduled. Follow-up if symptoms worsen or don't improve.

## 2021-11-02 NOTE — Telephone Encounter (Signed)
  Follow up Call-     11/01/2021    8:04 AM 01/06/2020    7:29 AM  Call back number  Post procedure Call Back phone  # [phone]  Permission to leave phone message Yes Yes     Patient questions:  Do you have a fever, pain , or abdominal swelling? No. Pain Score  0 *  Have you tolerated food without any problems? Yes.    Have you been able to return to your normal activities? Yes.    Do you have any questions about your discharge instructions: Diet   No. Medications  No. Follow up visit  No.  Do you have questions or concerns about your Care? No.  Actions: * If pain score is 4 or above: No action needed, pain <4.

## 2021-11-20 ENCOUNTER — Other Ambulatory Visit: Payer: Self-pay | Admitting: Cardiovascular Disease

## 2021-12-06 ENCOUNTER — Encounter: Payer: Self-pay | Admitting: Gastroenterology

## 2021-12-07 DIAGNOSIS — H524 Presbyopia: Secondary | ICD-10-CM | POA: Diagnosis not present

## 2021-12-07 DIAGNOSIS — H52213 Irregular astigmatism, bilateral: Secondary | ICD-10-CM | POA: Diagnosis not present

## 2021-12-07 DIAGNOSIS — H5203 Hypermetropia, bilateral: Secondary | ICD-10-CM | POA: Diagnosis not present

## 2022-01-05 ENCOUNTER — Other Ambulatory Visit: Payer: Self-pay | Admitting: Internal Medicine

## 2022-01-05 DIAGNOSIS — J4489 Other specified chronic obstructive pulmonary disease: Secondary | ICD-10-CM

## 2022-01-15 ENCOUNTER — Telehealth: Payer: Self-pay | Admitting: *Deleted

## 2022-01-15 NOTE — Chronic Care Management (AMB) (Signed)
  Care Coordination  Outreach Note  01/15/2022 Name: Hayden Deleon MRN: [id number] DOB: 1955/07/17   Care Coordination Outreach Attempts: An unsuccessful telephone outreach was attempted today to offer the patient information about available care coordination services as a benefit of their health plan.   Follow Up Plan:  Additional outreach attempts will be made to offer the patient care coordination information and services.   Encounter Outcome:  Pt. Request to Call [place]  Direct Dial: [phone]

## 2022-01-23 NOTE — Chronic Care Management (AMB) (Signed)
  Care Coordination   Note   01/23/2022 Name: Hayden Deleon MRN: [id number] DOB: [birth year]  Hayden Deleon is a 66 y.o. year old male who sees Libby Maw, MD for primary care. I reached out to [name] by phone today to offer care coordination services.  Hayden Deleon was given information about Care Coordination services today including:   The Care Coordination services include support from the care team which includes your Nurse Coordinator, Clinical Social Worker, or Pharmacist.  The Care Coordination team is here to help remove barriers to the health concerns and goals most important to you. Care Coordination services are voluntary, and the patient may decline or stop services at any time by request to their care team member.   Care Coordination Consent Status: Patient agreed to services and verbal consent obtained.   Follow up plan:  Telephone appointment with care coordination team member scheduled for:  02/06/22  Encounter Outcome:  Pt. Scheduled  [place]  Direct Dial: [phone]

## 2022-01-25 ENCOUNTER — Ambulatory Visit: Payer: BC Managed Care – PPO | Attending: Nurse Practitioner | Admitting: Nurse Practitioner

## 2022-01-25 ENCOUNTER — Encounter: Payer: Self-pay | Admitting: Nurse Practitioner

## 2022-01-25 VITALS — BP 132/70 | HR 67 | Ht 69.0 in | Wt 198.0 lb

## 2022-01-25 DIAGNOSIS — E785 Hyperlipidemia, unspecified: Secondary | ICD-10-CM

## 2022-01-25 DIAGNOSIS — I10 Essential (primary) hypertension: Secondary | ICD-10-CM

## 2022-01-25 DIAGNOSIS — I2510 Atherosclerotic heart disease of native coronary artery without angina pectoris: Secondary | ICD-10-CM | POA: Diagnosis not present

## 2022-01-25 DIAGNOSIS — I6523 Occlusion and stenosis of bilateral carotid arteries: Secondary | ICD-10-CM | POA: Diagnosis not present

## 2022-01-25 NOTE — Patient Instructions (Signed)
Medication Instructions:   Your physician recommends that you continue on your current medications as directed. Please refer to the Current Medication list given to you today.  *If you need a refill on your cardiac medications before your next appointment, please call your pharmacy*   Lab Work:  None Ordered  If you have labs (blood work) drawn today and your tests are completely normal, you will receive your results only by: [place] (if you have MyChart) OR A paper copy in the mail If you have any lab test that is abnormal or we need to change your treatment, we will call you to review the results.   Testing/Procedures:  Your physician has requested that you have a carotid duplex. This test is an ultrasound of the carotid arteries in your neck. It looks at blood flow through these arteries that supply the brain with blood. Allow one hour for this exam. There are no restrictions or special instructions.   2. Echocardiogram - willing to go to [place] if needed due to needing it for his [place] has requested that you have an echocardiogram. Echocardiography is a painless test that uses sound waves to create images of your heart. It provides your doctor with information about the size and shape of your heart and how well your heart's chambers and valves are working. This procedure takes approximately one hour. There are no restrictions for this procedure. Please note; depending on visual quality an IV may need to be placed.    Follow-Up: At [facility], you and your health needs are our priority.  As part of our continuing mission to provide you with exceptional heart care, we have created designated Provider Care Teams.  These Care Teams include your primary Cardiologist (physician) and Advanced Practice Providers (APPs -  Physician Assistants and Nurse Practitioners) who all work together to provide you with the care you need, when you need it.  We  recommend signing up for the patient portal called "MyChart".  Sign up information is provided on this After Visit Summary.  MyChart is used to connect with patients for Virtual Visits (Telemedicine).  Patients are able to view lab/test results, encounter notes, upcoming appointments, etc.  Non-urgent messages can be sent to your provider as well.   To learn more about what you can do with MyChart, go to [email or web address].    Your next appointment:   6 month(s)  The format for your next appointment:   In Person  Provider:   You may see [name], MD or one of the following Advanced Practice Providers on your designated Care Team:   Murray Hodgkins, NP Christell Faith, PA-C Cadence Kathlen Mody, PA-C Gerrie Nordmann, NP

## 2022-01-25 NOTE — Progress Notes (Signed)
Office Visit    Patient Name: Hayden Deleon Date of Encounter: 01/25/2022  Primary Care Provider:  Libby Maw, MD Primary Cardiologist:  Hayden Sacramento, MD  Chief Complaint    66 year old male with a history of CAD status post CABG x3 in August 2022, hypertension, hyperlipidemia, COPD, sleep apnea, and carotid arterial disease, who presents for follow-up related to CAD.  Past Medical History    Past Medical History:  Diagnosis Date   Allergy    Anxiety    Arthritis    right shoulder   CAD (coronary artery disease)    a. 2003 Cath: OM1 100%, good collats, RCA 60-70p-->med rx; b. 04/2020 MV: mild ischemia to sm area of mid inflat & apical lat segments. EF >65%; d. 10/2020 Cath: LM 85ost, LAD 30p/m, OM1 100, RCA 99p/m; e. 10/2020 CABG x 3: LIMA->LAD, VG->RPDA, VG->OM2.   Carotid artery disease ([facility])    a. 03/2020 Carotid U/S: RICA 9-70%, LICA 26-37%; b. 85/8850 U/S: 40-59% bilat ICA stenoses.   COPD (chronic obstructive pulmonary disease) ([facility]den De[facility]on)    Significant Dr. Lamonte Deleon   Diastolic dysfunction    a. 03/2018 Echo: EF 60-65%, no rwma, Gr1 DD. Mildly dil LA. Nl RV fxn.   Dilation of ascending aorta and aortic root ([place])    a. 10/2020 Echo: Asc Ao 6m.   Diverticulosis of colon    Emphysema of lung (HCC)    GERD (gastroesophageal reflux disease)    Gout    Hyperlipidemia    Hypertension    LVH (left ventricular hypertrophy)    a. 10/2020 Echo: EF 70-75%, no rwma, mod LVH. Nl RV size/fxn. Mildly dil RA. Triv MR. Asc Ao 416m   Prostatitis    Sleep apnea    uses cpap nightly   Past Surgical History:  Procedure Laterality Date   COLONOSCOPY  12/2003   [facility] - polyp   CORONARY ARTERY BYPASS GRAFT N/A 11/05/2020   Procedure: CORONARY ARTERY BYPASS GRAFTING (CABG) TIMES 3, USING LEFT INTERNAL MAMMARY ARTERY AND GREATER SAPHEOUS VEIN HARVESTED ENDOSCOPICALLY LEFT AND RIGHT LEGS;  Surgeon: Hayden Deleon;  [facility]: [facility] Service: Open Heart Surgery;   Laterality: N/A;   deviated septum     KNEE ARTHROSCOPY     right   LEFT HEART CATH AND CORONARY ANGIOGRAPHY N/A 11/03/2020   Procedure: LEFT HEART CATH AND CORONARY ANGIOGRAPHY;  Surgeon: Hayden Deleon;  Location: [facility];  Service: Cardiovascular;  Laterality: N/A;   TOTAL SHOULDER ARTHROPLASTY Right 02/03/2018   Procedure: RIGHT REVERSE SHOULDER ARTHROPLASTY;  Surgeon: Hayden Deleon;  Location: [facility] Service: Orthopedics;  Laterality: Right;   WISDOM TOOTH EXTRACTION      Allergies  Allergies  Allergen Reactions   Avelox [Moxifloxacin Hcl In Nacl] Other (See Comments)    Facial swelling   Sulfamethoxazole-Trimethoprim Other (See Comments)    REACTION: sore mouth   Imdur [Isosorbide Nitrate] Other (See Comments)    Made pt feel groggy, loopy, bad dreams    Aspirin Other (See Comments)    REACTION: Nausea; fine with '81mg'$     History of Present Illness    [age]ear old male with above past medical history including CAD, hypertension, hyperlipidemia, COPD, sleep apnea, and carotid arterial disease.  Diagnostic catheterization in 2003 showed an occluded OM1 with collaterals, as well as a 60 to 70% proximal RCA stenosis.  EF was normal.  He was medically managed.  He had a prolonged history of stable  angina with mild chest pain with overexertion.  Stress testing in January 2022 showed a small area of mild ischemia in the inferolateral and apical lateral segments.  EF was 65%.  CT imaging showed coronary calcifications and aortic atherosclerosis.  He was also noted to have bilateral groundglass opacities with more focal consolidation versus scarring in the left lower lobe with recommendation for dedicated CT if clinically appropriate.  Due to progressive symptoms, he underwent diagnostic catheterization August 2022, showing an 85% ostial left main stenosis, occluded OM1, and 99% proximal and mid RCA stenoses.  He was transferred to [facility] underwent CABG x3 with a  LIMA to the LAD, vein graft to the PDA, and vein graft to the OM 2.  In the setting of known carotid disease, he underwent carotid ultrasound in December 2022 which showed 40 to 59% bilateral internal carotid artery stenoses.  Hayden Deleon was last seen in cardiology clinic in April 2023 at which time he was doing well and had completed cardiac rehabilitation.  He was down almost 40 pounds since his bypass surgery and had made significant lifestyle modifications.  Since his last visit, he has continued to do well.  He is exercising most days of the week, walking either outside or on his treadmill.  He does not experience chest pain or dyspnea.  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  He denies palpitations, PND, orthopnea, dizziness, syncope, edema, or early satiety.  Home Medications    Current Outpatient Medications  Medication Sig Dispense Refill   albuterol (VENTOLIN HFA) 108 (90 Base) MCG/ACT inhaler TAKE 2 PUFFS BY MOUTH EVERY 6 HOURS AS NEEDED FOR WHEEZE OR SHORTNESS OF BREATH 6.7 each 10   aspirin EC 325 MG EC tablet Take 1 tablet (325 mg total) by mouth daily. 30 tablet 0   atorvastatin (LIPITOR) 80 MG tablet TAKE 1 TABLET BY MOUTH EVERY DAY 90 tablet 0   guaiFENesin (MUCINEX) 600 MG 12 hr tablet Take 1 tablet (600 mg total) by mouth 2 (two) times daily. 60 tablet 1   Multiple Vitamin (MULTIVITAMIN) capsule Take 1 capsule by mouth daily.     sertraline (ZOLOFT) 50 MG tablet Take 1 tablet (50 mg total) by mouth daily. 90 tablet 1   TRELEGY ELLIPTA 100-62.5-25 MCG/ACT AEPB INHALE 1 PUFF BY MOUTH EVERY DAY 180 each 2   Current Facility-Administered Medications  Medication Dose Route Frequency Provider Last Rate Last Admin   0.9 %  sodium chloride infusion  500 mL Intravenous Once Hayden Deleon, Hayden V, MD         Review of Systems    He denies chest pain, palpitations, dyspnea, pnd, orthopnea, n, Deleon, dizziness, syncope, edema, weight gain, or  early satiety.  All other systems reviewed and are otherwise negative except as noted above.    Physical Exam    VS:  BP 132/70 (BP Location: Left Arm, Patient Position: Sitting, Cuff Size: Large)   Pulse 67   Ht '5\' 9"'$  (1.753 m)   Wt 198 lb (89.8 kg)   SpO2 96%   BMI 29.24 kg/m  , BMI Body mass index is 29.24 kg/m.     GEN: Well nourished, well developed, in no acute distress. HEENT: normal. Neck: Supple, no JVD, carotid bruits, or masses. Cardiac: RRR, no murmurs, rubs, or gallops. No clubbing, cyanosis, edema.  Radials/PT 2+ and equal bilaterally.  Respiratory:  Respirations regular and unlabored, clear to auscultation bilaterally. GI:  Soft, nontender, nondistended, BS + x 4. MS: no deformity or atrophy. Skin: warm and dry, no rash. Neuro:  Strength and sensation are intact. Psych: Normal affect.  Accessory Clinical Findings    ECG personally reviewed by me today -regular sinus rhythm, 67- no acute changes.  Deleon Results  Component Value Date   WBC 6.8 04/04/2021   HGB 15.2 04/04/2021   HCT 45.9 04/04/2021   MCV 93.3 04/04/2021   PLT 168.0 04/04/2021   Deleon Results  Component Value Date   CREATININE 0.94 10/16/2021   BUN 14 10/16/2021   NA 140 10/16/2021   K 4.2 10/16/2021   CL 106 10/16/2021   CO2 27 10/16/2021   Deleon Results  Component Value Date   ALT 14 04/04/2021   AST 15 04/04/2021   ALKPHOS 63 04/04/2021   BILITOT 0.8 04/04/2021   Deleon Results  Component Value Date   CHOL 129 10/16/2021   HDL 50.80 10/16/2021   LDLCALC 51 10/16/2021   LDLDIRECT 71.0 01/03/2016   TRIG 140.0 10/16/2021   CHOLHDL 3 10/16/2021    Deleon Results  Component Value Date   HGBA1C 5.6 11/07/2020    Assessment & Plan    1.  Coronary artery disease: Status post CABG x3 in August 2022 with a LIMA to the LAD, vein graft to the OM 2, and vein graft to the RPDA.  He has done exceptionally well since his bypass surgery and continues to exercise regularly without symptoms or  limitations.  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  We will arrange for this.  He remains on aspirin and statin therapy.  Beginning in 2027, he will need exercise treadmill test every other year for CDL maintenance.  2.  Essential hypertension: Blood pressure recently elevated at outside clinic.  He says he is not sure what was going on that day as his pressure typically runs in the 120s to 130s.  He is 132/70 today.  He is not currently requiring an antihypertensive.  He will continue to follow blood pressures at home.  3.  Hyperlipidemia: LDL of 51 in July with normal LFTs in January.  Continue statin therapy.  4.  Carotid arterial disease: 40 to 59% bilateral internal coronary stenoses by ultrasound in December 2022.  We will arrange for follow-up.  Continue aspirin and statin therapy.  5.  Obstructive sleep apnea: Compliant with CPAP.  6.  Disposition: Follow-up 2D echocardiogram for CDL.  Follow-up in clinic in 6 months or sooner if necessary.   Murray Hodgkins, NP 01/25/2022, 3:48 PM

## 2022-01-30 ENCOUNTER — Ambulatory Visit: Payer: BC Managed Care – PPO

## 2022-02-01 ENCOUNTER — Ambulatory Visit
Admission: RE | Admit: 2022-02-01 | Discharge: 2022-02-01 | Disposition: A | Discharge status: Home / Self Care | Payer: BC Managed Care – PPO | Source: Ambulatory Visit | Attending: Nurse Practitioner | Admitting: Nurse Practitioner

## 2022-02-01 DIAGNOSIS — I3481 Nonrheumatic mitral (valve) annulus calcification: Secondary | ICD-10-CM | POA: Diagnosis not present

## 2022-02-01 DIAGNOSIS — Z951 Presence of aortocoronary bypass graft: Secondary | ICD-10-CM | POA: Insufficient documentation

## 2022-02-01 DIAGNOSIS — I2510 Atherosclerotic heart disease of native coronary artery without angina pectoris: Secondary | ICD-10-CM

## 2022-02-01 DIAGNOSIS — J449 Chronic obstructive pulmonary disease, unspecified: Secondary | ICD-10-CM | POA: Diagnosis not present

## 2022-02-01 DIAGNOSIS — I10 Essential (primary) hypertension: Secondary | ICD-10-CM | POA: Insufficient documentation

## 2022-02-01 DIAGNOSIS — E785 Hyperlipidemia, unspecified: Secondary | ICD-10-CM | POA: Insufficient documentation

## 2022-02-01 LAB — ECHOCARDIOGRAM COMPLETE
AR max vel: 3.01 cm2
AV Area VTI: 3.73 cm2
AV Area mean vel: 2.99 cm2
AV Mean grad: 5 mmHg
AV Peak grad: 7.7 mmHg
Ao pk vel: 1.39 m/s
Area-P 1/2: 2.79 cm2
S' Lateral: 2.5 cm

## 2022-02-01 NOTE — Progress Notes (Signed)
*  PRELIMINARY RESULTS* Echocardiogram 2D Echocardiogram has been performed.  Sherrie Sport 02/01/2022, 11:19 AM

## 2022-02-05 ENCOUNTER — Telehealth: Payer: Self-pay | Admitting: Nurse Practitioner

## 2022-02-05 ENCOUNTER — Other Ambulatory Visit: Payer: BC Managed Care – PPO

## 2022-02-05 NOTE — Telephone Encounter (Signed)
Patient calling to check on his DOT forms that Ignacia Bayley NP has on his desk. Advised that I would have provider fill out tomorrow and I will give him a call when they are ready for pick up. He verbalized understanding.

## 2022-02-05 NOTE — Telephone Encounter (Signed)
Pt states there is a DOT form that Sharolyn Douglas, NP has that needs to be signed. Please advise.

## 2022-02-06 ENCOUNTER — Ambulatory Visit: Payer: Self-pay

## 2022-02-06 NOTE — Telephone Encounter (Signed)
Spoke with patient and reviewed that forms are completed. He would like me to fax and then mail him the completed forms. Confirmation received from fax and placed in outgoing mail. He was appreciative for the help with no further questions at this time.

## 2022-02-06 NOTE — Patient Instructions (Signed)
Visit Information  Thank you for taking time to visit with me today. Please don't hesitate to contact me if I can be of assistance to you.   Following are the goals we discussed today:   Goals Addressed             This Visit's Progress    COMPLETED: Care Coordination Activites -no followup required        Care Coordination Interventions: Active listening / Reflection utilized  Emotional Support Provided Problem [place] strategies reviewed Discussed/.Educated Care Coordination Program 2.   Discussed/.Educated Annual Wellness Visit 3.   Discussed/.Educated Social Determinates of Health 4.   Please inform PCP if services needed in the future          If you are experiencing a Mental Health or [place] or need someone to talk to, please call 1-800-273-TALK (toll free, 24 hour hotline)  Patient verbalizes understanding of instructions and care plan provided today and agrees to view in [place]. Active MyChart status and patient understanding of how to access instructions and care plan via MyChart confirmed with patient.     Lazaro Arms RN, BSN, [facility]   Phone: [phone]

## 2022-02-06 NOTE — Telephone Encounter (Signed)
Janett Billow with Urgent care states the did received the forms this morning, but they are needing an actual clearance and if he has had an echo or stress test within the last 2 years. Phone: [phone]. Fax: [phone]

## 2022-02-06 NOTE — Patient Outreach (Signed)
  Care Coordination   Initial Visit Note   02/06/2022 Name: Hayden Deleon MRN: [id number] DOB: [birth year]  Hayden Deleon is a 66 y.o. year old male who sees Libby Maw, MD for primary care. I spoke with  [name] by phone today.  What matters to the patients health and wellness today?  I feel good and have no questions or concerns regarding my health.    Goals Addressed             This Visit's Progress    COMPLETED: Care Coordination Activites -no followup required        Care Coordination Interventions: Active listening / Reflection utilized  Emotional Support Provided Problem [place] strategies reviewed Discussed/.Educated Care Coordination Program 2.   Discussed/.Educated Annual Wellness Visit 3.   Discussed/.Educated Social Determinates of Health 4.   Please inform PCP if services needed in the future         SDOH assessments and interventions completed:  Yes  SDOH Interventions Today    Flowsheet Row Most Recent Value  SDOH Interventions   Food Insecurity Interventions Intervention Not Indicated  Transportation Interventions Intervention Not Indicated        Care Coordination Interventions Activated:  Yes  Care Coordination Interventions:  Yes, provided   Follow up plan: No further intervention required.   Encounter Outcome:  Pt. Visit Completed   Lazaro Arms RN, BSN, [facility]   Phone: [phone]

## 2022-02-07 NOTE — Telephone Encounter (Signed)
    Murray Hodgkins, NP [facility] [id number] [place], [place] [address] [phone]   Re: Hayden Deleon. [place]         DOB January 15, 1956   To whom it may concern,  Mr. Leverich is a 66 year old gentleman under my care at [facility] in [place].  He has a history of coronary artery disease and underwent coronary artery bypass grafting in August 2002.  He has done exceptionally well since then and his recent echocardiogram (below) continues to show normal left ventricular ejection fraction without any significant valvular abnormalities.  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  Sincerely,    Murray Hodgkins, NP   2D Echocardiogram 11.3.2023  IMPRESSIONS   1. Left ventricular ejection fraction, by estimation, is 70 to 75%. The  left ventricle has hyperdynamic function. Left ventricular endocardial  border not optimally defined to evaluate regional wall motion. Left  ventricular diastolic parameters are  consistent with Grade I diastolic dysfunction (impaired relaxation).   2. Right ventricular systolic function is normal. The right ventricular  size is normal. Tricuspid regurgitation signal is inadequate for assessing  PA pressure.   3. Right atrial size was mildly dilated.   4. The mitral valve is degenerative. Trivial mitral valve regurgitation.  No evidence of mitral stenosis. Moderate mitral annular calcification.   5. The aortic valve was not well visualized. Aortic valve regurgitation  is not visualized. No aortic stenosis is present.   6. Pulmonic valve regurgitation not well assessed.

## 2022-02-08 NOTE — Telephone Encounter (Signed)
Letter has been written and faxed to number listed. Placed in my files under "Faxes".

## 2022-02-14 ENCOUNTER — Encounter: Payer: Self-pay | Admitting: Family Medicine

## 2022-02-15 ENCOUNTER — Ambulatory Visit (INDEPENDENT_AMBULATORY_CARE_PROVIDER_SITE_OTHER): Payer: BC Managed Care – PPO | Admitting: Family Medicine

## 2022-02-15 ENCOUNTER — Encounter: Payer: Self-pay | Admitting: Family Medicine

## 2022-02-15 VITALS — BP 136/76 | HR 71 | Temp 97.6°F | Ht 69.0 in | Wt 196.2 lb

## 2022-02-15 DIAGNOSIS — J4521 Mild intermittent asthma with (acute) exacerbation: Secondary | ICD-10-CM | POA: Diagnosis not present

## 2022-02-15 DIAGNOSIS — Z23 Encounter for immunization: Secondary | ICD-10-CM

## 2022-02-15 DIAGNOSIS — J45909 Unspecified asthma, uncomplicated: Secondary | ICD-10-CM | POA: Insufficient documentation

## 2022-02-15 MED ORDER — PREDNISONE 20 MG PO TABS: 20.0000 mg | ORAL_TABLET | Freq: Two times a day (BID) | ORAL | 0 refills | Status: AC

## 2022-02-15 MED ORDER — BENZONATATE 200 MG PO CAPS: 200.0000 mg | ORAL_CAPSULE | Freq: Two times a day (BID) | ORAL | 0 refills | Status: DC | PRN

## 2022-02-15 NOTE — Progress Notes (Signed)
Established Patient Office Visit  Subjective   Patient ID: Hayden Deleon, male    DOB: 08-30-1955  Age: 66 y.o. MRN: [id number]  Chief Complaint  Patient presents with   Cough    Cough and congestion x 3 days.     Cough Associated symptoms include wheezing. Pertinent negatives include no chills, eye redness, fever, headaches, hemoptysis, myalgias, rash or shortness of breath.   3-day history of increasing tightness and wheezing.  It is worse in the AM.  It has improved today.  Cough is productive of scant beige phlegm.  There is mild congestion.  there has been no fever or chills, malaise, headache, myalgias or arthralgias.  Feels well otherwise.    Review of Systems  Constitutional: Negative.  Negative for chills, fever and malaise/fatigue.  HENT:  Positive for congestion.   Eyes:  Negative for blurred vision, discharge and redness.  Respiratory:  Positive for cough, sputum production and wheezing. Negative for hemoptysis and shortness of breath.   Cardiovascular: Negative.   Gastrointestinal:  Negative for abdominal pain.  Genitourinary: Negative.   Musculoskeletal: Negative.  Negative for joint pain and myalgias.  Skin:  Negative for rash.  Neurological:  Negative for tingling, loss of consciousness, weakness and headaches.  Endo/Heme/Allergies:  Negative for polydipsia.      Objective:     BP 136/76 (BP Location: Right Arm, Patient Position: Sitting, Cuff Size: Normal)   Pulse 71   Temp 97.6 F (36.4 C) (Temporal)   Ht '5\' 9"'$  (1.753 m)   Wt 196 lb 3.2 oz (89 kg)   SpO2 93%   BMI 28.97 kg/m    Physical Exam Constitutional:      General: He is not in acute distress.    Appearance: Normal appearance. He is not ill-appearing, toxic-appearing or diaphoretic.  HENT:     Head: Normocephalic and atraumatic.     Right Ear: External ear normal.     Left Ear: External ear normal.     Mouth/Throat:     Mouth: Mucous membranes are moist.     Pharynx: Oropharynx is  clear. No oropharyngeal exudate or posterior oropharyngeal erythema.  Eyes:     General: No scleral icterus.       Right eye: No discharge.        Left eye: No discharge.     Extraocular Movements: Extraocular movements intact.     Conjunctiva/sclera: Conjunctivae normal.     Pupils: Pupils are equal, round, and reactive to light.  Cardiovascular:     Rate and Rhythm: Normal rate and regular rhythm.  Pulmonary:     Effort: Pulmonary effort is normal. No respiratory distress.     Breath sounds: Normal breath sounds. Decreased air movement present. No wheezing or rales.  Abdominal:     General: Bowel sounds are normal.     Tenderness: There is no abdominal tenderness. There is no guarding.  Musculoskeletal:     Cervical back: No rigidity or tenderness.  Skin:    General: Skin is warm and dry.  Neurological:     Mental Status: He is alert and oriented to person, place, and time.  Psychiatric:        Mood and Affect: Mood normal.        Behavior: Behavior normal.      No results found for any visits on 02/15/22.    The ASCVD Risk score (Arnett DK, et al., 2019) failed to calculate for the following reasons:   The valid  total cholesterol range is 130 to 320 mg/dL    Assessment & Plan:   Problem List Items Addressed This Visit       Respiratory   Reactive airway disease - Primary   Relevant Medications   benzonatate (TESSALON) 200 MG capsule   predniSONE (DELTASONE) 20 MG tablet   Other Visit Diagnoses     Flu vaccine need       Relevant Orders   Flu vaccine HIGH DOSE PF (Fluzone High dose) (Completed)       Return Call Monday/Tuesday prn.Libby Maw, MD

## 2022-02-16 ENCOUNTER — Other Ambulatory Visit: Payer: Self-pay | Admitting: Cardiovascular Disease

## 2022-03-07 ENCOUNTER — Encounter: Payer: Self-pay | Admitting: Family Medicine

## 2022-03-07 ENCOUNTER — Ambulatory Visit (INDEPENDENT_AMBULATORY_CARE_PROVIDER_SITE_OTHER): Payer: BC Managed Care – PPO | Admitting: Family Medicine

## 2022-03-07 VITALS — BP 136/70 | HR 67 | Temp 97.3°F | Ht 69.0 in | Wt 193.4 lb

## 2022-03-07 DIAGNOSIS — J4530 Mild persistent asthma, uncomplicated: Secondary | ICD-10-CM

## 2022-03-07 DIAGNOSIS — J22 Unspecified acute lower respiratory infection: Secondary | ICD-10-CM | POA: Insufficient documentation

## 2022-03-07 MED ORDER — DOXYCYCLINE HYCLATE 100 MG PO TABS: 100.0000 mg | ORAL_TABLET | Freq: Two times a day (BID) | ORAL | 0 refills | Status: AC

## 2022-03-07 MED ORDER — BENZONATATE 200 MG PO CAPS: 200.0000 mg | ORAL_CAPSULE | Freq: Two times a day (BID) | ORAL | 0 refills | Status: DC | PRN

## 2022-03-07 MED ORDER — PREDNISONE 20 MG PO TABS: 20.0000 mg | ORAL_TABLET | Freq: Two times a day (BID) | ORAL | 0 refills | Status: AC

## 2022-03-07 NOTE — Progress Notes (Addendum)
Established Patient Office Visit   Subjective:  Patient ID: Hayden Deleon, male    DOB: December 26, 1955  Age: 66 y.o. MRN: [id number]  Chief Complaint  Patient presents with   Cough    Cough congestion, runny nose x 3 days.    Cough Associated symptoms include wheezing. Pertinent negatives include no eye redness, myalgias or rash.   Encounter Diagnoses  Name Primary?   Lower respiratory tract infection Yes   Mild persistent reactive airway disease without complication    For follow-up of URI signs symptoms.  Seem to prove after visit a few weeks ago.  Symptoms returned over the last 3 days.  Increased cough productive of beige phlegm.  Increasing wheezing.  Increased rescue inhaler use.  Continues with Trelegy.  No fever or chills.   Review of Systems  Constitutional: Negative.   HENT: Negative.    Eyes:  Negative for blurred vision, discharge and redness.  Respiratory:  Positive for cough, sputum production and wheezing.   Cardiovascular: Negative.   Gastrointestinal:  Negative for abdominal pain.  Genitourinary: Negative.   Musculoskeletal: Negative.  Negative for myalgias.  Skin:  Negative for rash.  Neurological:  Negative for tingling, loss of consciousness and weakness.  Endo/Heme/Allergies:  Negative for polydipsia.     Current Outpatient Medications:    albuterol (VENTOLIN HFA) 108 (90 Base) MCG/ACT inhaler, TAKE 2 PUFFS BY MOUTH EVERY 6 HOURS AS NEEDED FOR WHEEZE OR SHORTNESS OF BREATH, Disp: 6.7 each, Rfl: 10   aspirin EC 325 MG EC tablet, Take 1 tablet (325 mg total) by mouth daily., Disp: 30 tablet, Rfl: 0   atorvastatin (LIPITOR) 80 MG tablet, TAKE 1 TABLET BY MOUTH EVERY DAY, Disp: 90 tablet, Rfl: 1   benzonatate (TESSALON) 200 MG capsule, Take 1 capsule (200 mg total) by mouth 2 (two) times daily as needed for cough., Disp: 20 capsule, Rfl: 0   doxycycline (VIBRA-TABS) 100 MG tablet, Take 1 tablet (100 mg total) by mouth 2 (two) times daily for 10 days., Disp:  20 tablet, Rfl: 0   Multiple Vitamin (MULTIVITAMIN) capsule, Take 1 capsule by mouth daily., Disp: , Rfl:    predniSONE (DELTASONE) 20 MG tablet, Take 1 tablet (20 mg total) by mouth 2 (two) times daily with a meal for 7 days., Disp: 14 tablet, Rfl: 0   sertraline (ZOLOFT) 50 MG tablet, Take 1 tablet (50 mg total) by mouth daily., Disp: 90 tablet, Rfl: 1   TRELEGY ELLIPTA 100-62.5-25 MCG/ACT AEPB, INHALE 1 PUFF BY MOUTH EVERY DAY, Disp: 180 each, Rfl: 2   Objective:     BP 136/70 (BP Location: Right Arm, Patient Position: Sitting, Cuff Size: Normal)   Pulse 67   Temp (!) 97.3 F (36.3 C) (Temporal)   Ht '5\' 9"'$  (1.753 m)   Wt 193 lb 6.4 oz (87.7 kg)   SpO2 95%   BMI 28.56 kg/m    Physical Exam Constitutional:      General: He is not in acute distress.    Appearance: Normal appearance. He is not ill-appearing, toxic-appearing or diaphoretic.  HENT:     Head: Normocephalic and atraumatic.     Right Ear: External ear normal.     Left Ear: External ear normal.     Mouth/Throat:     Mouth: Mucous membranes are moist.     Pharynx: Oropharynx is clear. No oropharyngeal exudate or posterior oropharyngeal erythema.  Eyes:     General: No scleral icterus.  Right eye: No discharge.        Left eye: No discharge.     Extraocular Movements: Extraocular movements intact.     Conjunctiva/sclera: Conjunctivae normal.     Pupils: Pupils are equal, round, and reactive to light.  Cardiovascular:     Rate and Rhythm: Normal rate and regular rhythm.  Pulmonary:     Effort: Pulmonary effort is normal. No respiratory distress.     Breath sounds: Normal breath sounds. Decreased air movement present. No decreased breath sounds, rhonchi or rales.  Abdominal:     General: Bowel sounds are normal.     Tenderness: There is no abdominal tenderness. There is no guarding.  Musculoskeletal:     Cervical back: No rigidity or tenderness.  Skin:    General: Skin is warm and dry.  Neurological:      Mental Status: He is alert and oriented to person, place, and time.  Psychiatric:        Mood and Affect: Mood normal.        Behavior: Behavior normal.      No results found for any visits on 03/07/22.    The ASCVD Risk score (Arnett DK, et al., 2019) failed to calculate for the following reasons:   The valid total cholesterol range is 130 to 320 mg/dL    Assessment & Plan:   Lower respiratory tract infection -     Doxycycline Hyclate; Take 1 tablet (100 mg total) by mouth 2 (two) times daily for 10 days.  Dispense: 20 tablet; Refill: 0 -     Benzonatate; Take 1 capsule (200 mg total) by mouth 2 (two) times daily as needed for cough.  Dispense: 20 capsule; Refill: 0 -     COVID-19, Flu A+B and RSV  Mild persistent reactive airway disease without complication -     predniSONE; Take 1 tablet (20 mg total) by mouth 2 (two) times daily with a meal for 7 days.  Dispense: 14 tablet; Refill: 0 -     Benzonatate; Take 1 capsule (200 mg total) by mouth 2 (two) times daily as needed for cough.  Dispense: 20 capsule; Refill: 0    Return if symptoms worsen or fail to improve.  Routine follow-up is scheduled next week with his pulmonologist.  Advised that he have the RSV vaccine when well.  Libby Maw, MD

## 2022-03-07 NOTE — Addendum Note (Signed)
Addended by: Jon Billings on: 03/07/2022 10:28 AM   Modules accepted: Orders

## 2022-03-08 LAB — COVID-19, FLU A+B AND RSV
Influenza A, NAA: NOT DETECTED
Influenza B, NAA: NOT DETECTED
RSV, NAA: DETECTED — AB
SARS-CoV-2, NAA: NOT DETECTED

## 2022-03-12 ENCOUNTER — Ambulatory Visit (INDEPENDENT_AMBULATORY_CARE_PROVIDER_SITE_OTHER): Payer: BC Managed Care – PPO | Admitting: Internal Medicine

## 2022-03-12 ENCOUNTER — Encounter: Payer: Self-pay | Admitting: Internal Medicine

## 2022-03-12 VITALS — BP 130/72 | HR 87 | Temp 97.8°F | Ht 70.0 in | Wt 193.0 lb

## 2022-03-12 DIAGNOSIS — G4733 Obstructive sleep apnea (adult) (pediatric): Secondary | ICD-10-CM | POA: Diagnosis not present

## 2022-03-12 DIAGNOSIS — J449 Chronic obstructive pulmonary disease, unspecified: Secondary | ICD-10-CM | POA: Diagnosis not present

## 2022-03-12 NOTE — Patient Instructions (Signed)
Continue Trelegy Ventolin HFA as needed Continue weight loss Continue CPAP therapy   Avoid secondhand smoke Avoid SICK contacts Please Mask when appropriate Please Keep up-to-date with vaccinations

## 2022-03-12 NOTE — Progress Notes (Signed)
[facility] Consultation        Date: 03/12/2022  MRN# [id number] Hayden Deleon January 17, 1956    Hayden Deleon is a 66 y.o. old male seen in consultation for chief complaint of:  SYNOPSIS Established care for COPD and OSA  **CPAP download 07/2019 Excellent compliance report CPAP set at 7 AHI down to 2.6 93% for greater than 4 hours 9% for days  **CPAP download 05/22/2018- 08/19/2018.>>  Raw data present reviewed.  Average usage on days used is 6 hours 40 minutes.  Usage greater than 4 hours is 77/90 days.  CPAP set at 7.  Leaks are within normal limits.  Residual AHI is 2.  Overall this shows good compliance with CPAP with excellent control obstructive sleep apnea. **Pneumovax administered 12/30/16 **PFT 12/24/16; Spirometry: FVC was 62% of predicted, FEV1 was 33% of predicted, ratio is 43%. Flow volume loop is consistent with obstruction. There is significant improvement with bronchodilator therapy. Lung volumes: TLC is 78% of predicted, RV to TLC ratio was normal. DLCO is 49% of predicted. -Overall this test is consistent with severe COPD with reversibility, there may also be an element of mild restrictive lung disease.  **CBC 08/22/16; Eos=600 during an episode of AECOPD. At baseline he is at 300.   **Alpha-1 09/30/16>> normal.  **CT chest 09/06/16 shows severe diffuse panlobular emphysema, with some left basilar scarring, left pleural thickening, small effusion with leftward mediastinal shift.. **Sleep study 05/30/12>>AHI 35  12/28/2012 PSG showed -AHI 28 times/h  He did not sleep much on CPAP - start CPAP 7cm , pillows, humidity,  Download 10/28/12 on 7 cm - AHI 5/h, good usage, no leak  LAST OV DOWNLOAD excellent compliance report Patient uses CPAP 7 cm water pressure 90% compliance for greater than 4 hours 93% compliance for days there is no leak uses nasal pillows Previous AHI was 35    CC FOLLOW  UP OSA FOLLOW COPD   HPI:   RE COPD Follow-up  assessment of COPD Chronic shortness of breath and dyspnea exertion seems to be stable Currently on triple therapy with Trelegy Previous history of COPD exacerbations Gold stage D Currently on prednisone for exacerbation at this time Patient diagnosed with RSV pneumonia Respiratory status stable no hospitalization required    Regarding OSA Previous download shows excellent compliance with well-controlled sleep apnea AHI reduced to 1.8 Download for the last 2 weeks shows 100% compliance for greater than 4 hours and 100% compliance for the nights Patient currently on CPAP 7 cm of water pressure AHI reduced to 1.7 Use and benefits from CPAP therapy Previous AHI is 35   Previous weight 237 Current weight is 195 pounds    s/p CABG  Doing well post op    BP 130/72 (BP Location: Left Arm, Cuff Size: Normal)   Pulse 87   Temp 97.8 F (36.6 C) (Temporal)   Ht '5\' 10"'$  (1.778 m)   Wt 193 lb (87.5 kg)   SpO2 93%   BMI 27.69 kg/m       Review of Systems: Gen:  Denies  fever, sweats, chills weight loss  HEENT: Denies blurred vision, double vision, ear pain, eye pain, hearing loss, nose bleeds, sore throat Cardiac:  No dizziness, chest pain or heaviness, chest tightness,edema, No JVD Resp:   No cough, -sputum production, -shortness of breath,-wheezing, -hemoptysis,  Other:  All other systems negative    Physical Examination:   General Appearance: No distress  EYES PERRLA, EOM intact.   NECK  Supple, No JVD Pulmonary: normal breath sounds, No wheezing.  CardiovascularNormal S1,S2.  No m/r/g.   Abdomen: Benign, Soft, non-tender. ALL OTHER ROS ARE NEGATIVE      Allergies:  Avelox [moxifloxacin hcl in nacl], Sulfamethoxazole-trimethoprim, Imdur [isosorbide nitrate], and Aspirin   Assessment and Plan:  66 year old pleasant white male seen today for follow-up severe COPD based on pulmonary function test with FEV1 33% predicted with underlying severe sleep apnea in the  setting of obesity and deconditioned state   COPD exacerbation at this time  Severe Gold stage D  Continue Trelegy therapy  Albuterol as needed Prednisone for RSV pneumonia at this time No respiratory compromise noted alpha 1 levels normal  S/p CABG Follow up cardiothoracic surgery   Regarding OSA Download for the last 2 weeks showed excellent compliance Patient benefits from therapy AHI reduced to 1.7  Obesity -Current weight 195    Deconditioned state -Recommend increased daily activity and exercise   MEDICATION ADJUSTMENTS/LABS AND TESTS ORDERED: Continue Trelegy Ventolin HFA as needed Continue weight loss Continue CPAP therapy   Avoid secondhand smoke Avoid SICK contacts Please Mask when appropriate Please Keep up-to-date with vaccinations  Patient satisfied with Plan of action and management. All questions answered  Follow-up in 6 months  TOTAL TIME SPENT 25 mins    Valera Vallas Patricia Pesa, M.D.  [facility]  Medical Director [place] Director [facility] Cardio-Pulmonary Department

## 2022-03-19 ENCOUNTER — Encounter: Payer: Self-pay | Admitting: Family Medicine

## 2022-03-20 ENCOUNTER — Ambulatory Visit: Payer: BC Managed Care – PPO | Attending: Nurse Practitioner

## 2022-03-20 DIAGNOSIS — I6523 Occlusion and stenosis of bilateral carotid arteries: Secondary | ICD-10-CM

## 2022-03-27 ENCOUNTER — Other Ambulatory Visit: Payer: Self-pay | Admitting: *Deleted

## 2022-03-27 DIAGNOSIS — I6523 Occlusion and stenosis of bilateral carotid arteries: Secondary | ICD-10-CM

## 2022-05-15 ENCOUNTER — Ambulatory Visit (INDEPENDENT_AMBULATORY_CARE_PROVIDER_SITE_OTHER): Payer: BC Managed Care – PPO | Admitting: Family Medicine

## 2022-05-15 ENCOUNTER — Encounter: Payer: Self-pay | Admitting: Family Medicine

## 2022-05-15 VITALS — BP 136/70 | HR 63 | Temp 98.0°F | Ht 70.0 in | Wt 195.2 lb

## 2022-05-15 DIAGNOSIS — Z8739 Personal history of other diseases of the musculoskeletal system and connective tissue: Secondary | ICD-10-CM | POA: Diagnosis not present

## 2022-05-15 DIAGNOSIS — Z23 Encounter for immunization: Secondary | ICD-10-CM | POA: Diagnosis not present

## 2022-05-15 DIAGNOSIS — Z0000 Encounter for general adult medical examination without abnormal findings: Secondary | ICD-10-CM | POA: Diagnosis not present

## 2022-05-15 DIAGNOSIS — I25118 Atherosclerotic heart disease of native coronary artery with other forms of angina pectoris: Secondary | ICD-10-CM | POA: Diagnosis not present

## 2022-05-15 DIAGNOSIS — Z125 Encounter for screening for malignant neoplasm of prostate: Secondary | ICD-10-CM

## 2022-05-15 DIAGNOSIS — K4090 Unilateral inguinal hernia, without obstruction or gangrene, not specified as recurrent: Secondary | ICD-10-CM

## 2022-05-15 DIAGNOSIS — F341 Dysthymic disorder: Secondary | ICD-10-CM

## 2022-05-15 DIAGNOSIS — E538 Deficiency of other specified B group vitamins: Secondary | ICD-10-CM | POA: Diagnosis not present

## 2022-05-15 DIAGNOSIS — M545 Low back pain, unspecified: Secondary | ICD-10-CM

## 2022-05-15 LAB — LIPID PANEL
Cholesterol: 127 mg/dL (ref 0–200)
HDL: 46.9 mg/dL (ref >39.00)
LDL Cholesterol: 51 mg/dL (ref 0–99)
NonHDL: 80.25
Total CHOL/HDL Ratio: 3
Triglycerides: 144 mg/dL (ref 0.0–149.0)
VLDL: 28.8 mg/dL (ref 0.0–40.0)

## 2022-05-15 LAB — COMPREHENSIVE METABOLIC PANEL
ALT: 14 U/L (ref 0–53)
AST: 16 U/L (ref 0–37)
Albumin: 4 g/dL (ref 3.5–5.2)
Alkaline Phosphatase: 60 U/L (ref 39–117)
BUN: 12 mg/dL (ref 6–23)
CO2: 29 mEq/L (ref 19–32)
Calcium: 9.5 mg/dL (ref 8.4–10.5)
Chloride: 104 mEq/L (ref 96–112)
Creatinine, Ser: 0.94 mg/dL (ref 0.40–1.50)
GFR: 84.51 mL/min (ref >60.00)
Glucose, Bld: 80 mg/dL (ref 70–99)
Potassium: 4.3 mEq/L (ref 3.5–5.1)
Sodium: 141 mEq/L (ref 135–145)
Total Bilirubin: 0.8 mg/dL (ref 0.2–1.2)
Total Protein: 6.5 g/dL (ref 6.0–8.3)

## 2022-05-15 LAB — VITAMIN B12: Vitamin B-12: 398 pg/mL (ref 211–911)

## 2022-05-15 LAB — CBC
HCT: 45.8 % (ref 39.0–52.0)
Hemoglobin: 15.6 g/dL (ref 13.0–17.0)
MCHC: 34.1 g/dL (ref 30.0–36.0)
MCV: 96 fl (ref 78.0–100.0)
Platelets: 184 10*3/uL (ref 150.0–400.0)
RBC: 4.78 Mil/uL (ref 4.22–5.81)
RDW: 13.7 % (ref 11.5–15.5)
WBC: 9.4 10*3/uL (ref 4.0–10.5)

## 2022-05-15 LAB — URINALYSIS, ROUTINE W REFLEX MICROSCOPIC
Bilirubin Urine: NEGATIVE
Hgb urine dipstick: NEGATIVE
Ketones, ur: NEGATIVE
Leukocytes,Ua: NEGATIVE
Nitrite: NEGATIVE
RBC / HPF: NONE SEEN (ref 0–?)
Specific Gravity, Urine: 1.025 (ref 1.000–1.030)
Total Protein, Urine: NEGATIVE
Urine Glucose: NEGATIVE
Urobilinogen, UA: 0.2 (ref 0.0–1.0)
pH: 6 (ref 5.0–8.0)

## 2022-05-15 LAB — URIC ACID: Uric Acid, Serum: 6.5 mg/dL (ref 4.0–7.8)

## 2022-05-15 LAB — PSA: PSA: 1.8 ng/mL (ref 0.10–4.00)

## 2022-05-15 NOTE — Progress Notes (Signed)
Established Patient Office Visit   Subjective:  Patient ID: Hayden Deleon, male    DOB: 1956/03/24  Age: 67 y.o. MRN: [id number]  Chief Complaint  Patient presents with   Annual Exam    CPE, discuss lower back pains that come and go. Patient fasting.     HPI Encounter Diagnoses  Name Primary?   Healthcare maintenance Yes   Coronary artery disease of native artery of native heart with stable angina pectoris (HCC)    History of gout    Dysthymia    B12 deficiency    Right-sided low back pain without sciatica, unspecified chronicity    Inguinal hernia of right side without obstruction or gangrene    Immunization due    Here for physical exam and follow-up of medical issues.  Has not been exercising regularly but is planning to restart his walking program as the weather is starting to clear.  Has regular dental care.  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  He is married and lives with his wife.  Lipids are well-controlled with high-dose atorvastatin regarding his history of coronary artery disease and carotid artery stenosis.  Continues to abstain from smoking.  Continue sertraline for dysthymia.  Occasional right lower back pain that is nonradiating.  There is no weakness in his lower extremities or paresthesias in his crotch area.   Review of Systems  Constitutional: Negative.   HENT: Negative.    Eyes:  Negative for blurred vision, discharge and redness.  Respiratory: Negative.    Cardiovascular: Negative.   Gastrointestinal:  Negative for abdominal pain.  Genitourinary: Negative.   Musculoskeletal:  Positive for back pain. Negative for myalgias.  Skin:  Negative for rash.  Neurological:  Negative for tingling, loss of consciousness and weakness.  Endo/Heme/Allergies:  Negative for polydipsia.      05/15/2022    9:26 AM 02/15/2022    2:12 PM 06/28/2021   12:15 PM  Depression screen PHQ 2/9  Decreased Interest 0 0 0  Down, Depressed, Hopeless 0 0 0  PHQ - 2  Score 0 0 0       Current Outpatient Medications:    albuterol (VENTOLIN HFA) 108 (90 Base) MCG/ACT inhaler, TAKE 2 PUFFS BY MOUTH EVERY 6 HOURS AS NEEDED FOR WHEEZE OR SHORTNESS OF BREATH, Disp: 6.7 each, Rfl: 10   aspirin EC 325 MG EC tablet, Take 1 tablet (325 mg total) by mouth daily., Disp: 30 tablet, Rfl: 0   atorvastatin (LIPITOR) 80 MG tablet, TAKE 1 TABLET BY MOUTH EVERY DAY, Disp: 90 tablet, Rfl: 1   Multiple Vitamin (MULTIVITAMIN) capsule, Take 1 capsule by mouth daily., Disp: , Rfl:    sertraline (ZOLOFT) 50 MG tablet, Take 1 tablet (50 mg total) by mouth daily., Disp: 90 tablet, Rfl: 1   TRELEGY ELLIPTA 100-62.5-25 MCG/ACT AEPB, INHALE 1 PUFF BY MOUTH EVERY DAY, Disp: 180 each, Rfl: 2   Objective:     BP 136/70 (BP Location: Right Arm, Patient Position: Sitting, Cuff Size: Normal)   Pulse 63   Temp 98 F (36.7 C) (Temporal)   Ht 5' 10"$  (1.778 m)   Wt 195 lb 3.2 oz (88.5 kg)   SpO2 94%   BMI 28.01 kg/m    Physical Exam Constitutional:      General: He is not in acute distress.    Appearance: Normal appearance. He is not ill-appearing, toxic-appearing or diaphoretic.  HENT:     Head: Normocephalic and atraumatic.     Right  Ear: Tympanic membrane, ear canal and external ear normal.     Left Ear: Tympanic membrane, ear canal and external ear normal.     Mouth/Throat:     Mouth: Mucous membranes are moist.     Pharynx: Oropharynx is clear. No oropharyngeal exudate or posterior oropharyngeal erythema.  Eyes:     General: No scleral icterus.       Right eye: No discharge.        Left eye: No discharge.     Extraocular Movements: Extraocular movements intact.     Conjunctiva/sclera: Conjunctivae normal.     Pupils: Pupils are equal, round, and reactive to light.  Cardiovascular:     Rate and Rhythm: Normal rate and regular rhythm.  Pulmonary:     Effort: Pulmonary effort is normal. No respiratory distress.     Breath sounds: Normal breath sounds.  Abdominal:      General: Bowel sounds are normal. There is no distension.     Tenderness: There is no abdominal tenderness. There is no guarding or rebound.     Hernia: A hernia is present. Hernia is present in the right inguinal area.  Genitourinary:    Prostate: Enlarged. Not tender and no nodules present.     Rectum: Guaiac result negative. No mass, tenderness, anal fissure, external hemorrhoid or internal hemorrhoid. Normal anal tone.  Musculoskeletal:     Cervical back: No rigidity or tenderness.  Lymphadenopathy:     Cervical: No cervical adenopathy.  Skin:    General: Skin is warm and dry.  Neurological:     Mental Status: He is alert and oriented to person, place, and time.  Psychiatric:        Mood and Affect: Mood normal.        Behavior: Behavior normal.      No results found for any visits on 05/15/22.    The ASCVD Risk score (Arnett DK, et al., 2019) failed to calculate for the following reasons:   The valid total cholesterol range is 130 to 320 mg/dL    Assessment & Plan:   Healthcare maintenance -     PSA -     Urinalysis, Routine w reflex microscopic  Coronary artery disease of native artery of native heart with stable angina pectoris (HCC) -     CBC -     Comprehensive metabolic panel -     Lipid panel  History of gout -     Uric acid  Dysthymia  B12 deficiency -     Vitamin B12  Right-sided low back pain without sciatica, unspecified chronicity -     DG Lumbar Spine 2-3 Views; Future  Inguinal hernia of right side without obstruction or gangrene  Immunization due -     Varicella-zoster vaccine IM    Return in about 6 months (around 11/13/2022).  Will send for plain films of the lumbar spine.  Will use Tylenol as needed.  Follow-up with increasing signs and symptoms.  Continue all medications as above.  First 2 Shingrix vaccines today.  Encourage regular exercise.  Libby Maw, MD

## 2022-05-16 ENCOUNTER — Encounter: Payer: Self-pay | Admitting: Family Medicine

## 2022-05-17 ENCOUNTER — Ambulatory Visit (INDEPENDENT_AMBULATORY_CARE_PROVIDER_SITE_OTHER)
Admission: RE | Admit: 2022-05-17 | Discharge: 2022-05-17 | Disposition: A | Discharge status: Home / Self Care | Payer: BC Managed Care – PPO | Source: Ambulatory Visit | Attending: Family Medicine | Admitting: Family Medicine

## 2022-05-17 DIAGNOSIS — M545 Low back pain, unspecified: Secondary | ICD-10-CM

## 2022-05-17 DIAGNOSIS — M47816 Spondylosis without myelopathy or radiculopathy, lumbar region: Secondary | ICD-10-CM | POA: Diagnosis not present

## 2022-05-17 NOTE — Telephone Encounter (Signed)
Please advise message below symptoms after having shingles vaccine.

## 2022-05-20 ENCOUNTER — Encounter: Payer: Self-pay | Admitting: Family Medicine

## 2022-05-22 ENCOUNTER — Ambulatory Visit (INDEPENDENT_AMBULATORY_CARE_PROVIDER_SITE_OTHER): Payer: BC Managed Care – PPO | Admitting: Family Medicine

## 2022-05-22 ENCOUNTER — Encounter: Payer: Self-pay | Admitting: Family Medicine

## 2022-05-22 VITALS — BP 134/80 | HR 72 | Temp 97.9°F | Wt 195.0 lb

## 2022-05-22 DIAGNOSIS — R0981 Nasal congestion: Secondary | ICD-10-CM

## 2022-05-22 DIAGNOSIS — R059 Cough, unspecified: Secondary | ICD-10-CM | POA: Diagnosis not present

## 2022-05-22 MED ORDER — FLUTICASONE PROPIONATE 50 MCG/ACT NA SUSP: 2.0000 | Freq: Every day | NASAL | 6 refills | Status: DC

## 2022-05-22 MED ORDER — DM-GUAIFENESIN ER 30-600 MG PO TB12: 1.0000 | ORAL_TABLET | Freq: Two times a day (BID) | ORAL | Status: DC

## 2022-05-22 MED ORDER — CHLORASEPTIC SORE THROAT 6-10 MG MT LOZG: 1.0000 | LOZENGE | OROMUCOSAL | 0 refills | Status: DC | PRN

## 2022-05-22 NOTE — Assessment & Plan Note (Signed)
Patient developed flu-like symptoms, sore throat, and increased congestion following administration of shingles vaccine.  Differential diagnosis:  Post-vaccination inflammatory response Upper respiratory tract infection (Viral) Bacterial pharyngitis Allergic rhinitis Sinusitis COPD exacerbation  Plan: Continue Trelegy for COPD and albuterol as baseline treatment. Advise patient to monitor symptoms and return if they worsen or respiratory status changes. Suggest over-the-counter Fluticasone (Flonase) nasal spray to reduce nasal swelling and improve drainage. Recommend warm fluids and honey to soothe the throat. Allow use of Tylenol for pain relief but be mindful of daily dosage limits. Provide patient education on symptom management, supportive care, and when to seek further medical attention.

## 2022-05-22 NOTE — Telephone Encounter (Signed)
Appointment scheduled today.

## 2022-05-22 NOTE — Patient Instructions (Signed)
For nasal congestion, use flonase.   For cough and sore throat, may use over the counter remedies as discussed.   For severe chest pain, shortness of breath, or any other worrisome symptom go to ED

## 2022-05-22 NOTE — Progress Notes (Signed)
Assessment/Plan:   Problem List Items Addressed This Visit       Other   Cough - Primary    Patient developed flu-like symptoms, sore throat, and increased congestion following administration of shingles vaccine.  Differential diagnosis:  Post-vaccination inflammatory response Upper respiratory tract infection (Viral) Bacterial pharyngitis Allergic rhinitis Sinusitis COPD exacerbation  Plan: Continue Trelegy for COPD and albuterol as baseline treatment. Advise patient to monitor symptoms and return if they worsen or respiratory status changes. Suggest over-the-counter Fluticasone (Flonase) nasal spray to reduce nasal swelling and improve drainage. Recommend warm fluids and honey to soothe the throat. Allow use of Tylenol for pain relief but be mindful of daily dosage limits. Provide patient education on symptom management, supportive care, and when to seek further medical attention.      Relevant Medications   fluticasone (FLONASE) 50 MCG/ACT nasal spray   dextromethorphan-guaiFENesin (MUCINEX DM) 30-600 MG 12hr tablet   benzocaine-menthol (CHLORASEPTIC SORE THROAT) 6-10 MG lozenge   Other Visit Diagnoses     Nasal congestion       Relevant Medications   fluticasone (FLONASE) 50 MCG/ACT nasal spray   dextromethorphan-guaiFENesin (MUCINEX DM) 30-600 MG 12hr tablet   benzocaine-menthol (CHLORASEPTIC SORE THROAT) 6-10 MG lozenge       There are no discontinued medications.    Subjective:  HPI: Encounter date: 05/22/2022  Hayden Deleon is a 67 y.o. male who has COLONIC POLYPS; HLD (hyperlipidemia); Anxiety state; HTN (hypertension); GASTROESOPHAGEAL REFLUX DISEASE, CHRONIC; HIATAL HERNIA; DIVERTICULOSIS, COLON; SYSTOLIC MURMUR; DYSPHAGIA UNSPECIFIED; ANGIOEDEMA; ADVEF, DRUG/MEDICINAL/BIOLOGICAL SUBST NOS; PROSTATITIS, HX OF; MIXED HYPERLIPIDEMIA; BENIGN POSITIONAL VERTIGO; CAD (coronary artery disease); Carotid artery disease (HCC); COPD (chronic obstructive  pulmonary disease) with chronic bronchitis ([place]); GERD (gastroesophageal reflux disease); Ejection fraction; OSA (obstructive sleep apnea); COPD with acute exacerbation ([place]); B12 deficiency; Cough; BPV (benign positional vertigo), left; Gout; Low testosterone; Shoulder arthritis; Inguinal hernia of right side without obstruction or gangrene; Coronary artery disease of native artery of native heart with stable angina pectoris ([place]); History of gout; Healthcare maintenance; Unstable angina (HCC); S/P CABG (coronary artery bypass graft); S/P CABG x 3; Dysthymia; Bilateral carotid artery stenosis; Reactive airway disease; Lower respiratory tract infection; and Right-sided low back pain without sciatica on their problem list..   He  has a past medical history of Allergy, Anxiety, Arthritis, CAD (coronary artery disease), Carotid artery disease ([place]), COPD (chronic obstructive pulmonary disease) ([place]), Diastolic dysfunction, Dilation of ascending aorta and aortic root ([place]), Diverticulosis of colon, Emphysema of lung ([place]), GERD (gastroesophageal reflux disease), Gout, Hyperlipidemia, Hypertension, LVH (left ventricular hypertrophy), Prostatitis, and Sleep apnea..   CHIEF COMPLAINT: Patient presents with sore throat, congestion, and cough following administration of shingles vaccine.  HISTORY OF PRESENT ILLNESS:  Cough. Patient reports onset of flu-like symptoms approximately two hours post shingles vaccination, with subsequent development of sore throat on Saturday and worsening in the evenings. Symptoms include a clear mucous cough and nasal congestion, improved since last week. Patient using albuterol as needed and reports no new or increased wheezing. Oxygen saturation recorded between 91-92%, which is normal for the patient.  Medications: Patient is using Trelegy for COPD and albuterol inhaler as needed for symptomatic relief, with no increased requirement. Patient took NightQuil once for sinus congestion  and occasionally uses Tylenol for pain management.  ROS:  General: No fever, weight loss, fatigue, or malaise. Respiratory: Reports nonproductive cough and clear nasal discharge. Cardiovascular: No chest pain, palpitations, or edema. Neurological: No dizziness, syncope, or numbness. Gastrointestinal: No nausea,  vomiting, diarrhea, or abdominal pain. Musculoskeletal: No joint pain, swelling, or muscle weakness. Integumentary: No new rashes or skin changes.  Remainder of ROS negative.  Past Surgical History:  Procedure Laterality Date   COLONOSCOPY  12/2003   [facility] - polyp   CORONARY ARTERY BYPASS GRAFT N/A 11/05/2020   Procedure: CORONARY ARTERY BYPASS GRAFTING (CABG) TIMES 3, USING LEFT INTERNAL MAMMARY ARTERY AND GREATER SAPHEOUS VEIN HARVESTED ENDOSCOPICALLY LEFT AND RIGHT LEGS;  Surgeon: Lajuana Matte, MD;  Location: [facility];  Service: Open Heart Surgery;  Laterality: N/A;   deviated septum     KNEE ARTHROSCOPY     right   LEFT HEART CATH AND CORONARY ANGIOGRAPHY N/A 11/03/2020   Procedure: LEFT HEART CATH AND CORONARY ANGIOGRAPHY;  Surgeon: Wellington Hampshire, MD;  Location: [facility];  Service: Cardiovascular;  Laterality: N/A;   TOTAL SHOULDER ARTHROPLASTY Right 02/03/2018   Procedure: RIGHT REVERSE SHOULDER ARTHROPLASTY;  Surgeon: Meredith Pel, MD;  Location: [facility];  Service: Orthopedics;  Laterality: Right;   WISDOM TOOTH EXTRACTION      Outpatient Medications Prior to Visit  Medication Sig Dispense Refill   albuterol (VENTOLIN HFA) 108 (90 Base) MCG/ACT inhaler TAKE 2 PUFFS BY MOUTH EVERY 6 HOURS AS NEEDED FOR WHEEZE OR SHORTNESS OF BREATH 6.7 each 10   aspirin EC 325 MG EC tablet Take 1 tablet (325 mg total) by mouth daily. 30 tablet 0   atorvastatin (LIPITOR) 80 MG tablet TAKE 1 TABLET BY MOUTH EVERY DAY 90 tablet 1   Multiple Vitamin (MULTIVITAMIN) capsule Take 1 capsule by mouth daily.     sertraline (ZOLOFT) 50 MG tablet Take 1 tablet (50 mg  total) by mouth daily. 90 tablet 1   TRELEGY ELLIPTA 100-62.5-25 MCG/ACT AEPB INHALE 1 PUFF BY MOUTH EVERY DAY 180 each 2   No facility-administered medications prior to visit.    Family History  Problem Relation Age of Onset   Angina Mother        car accident   Asthma Father    Crohn's disease Sister    Colon cancer Neg Hx    Esophageal cancer Neg Hx    Rectal cancer Neg Hx    Stomach cancer Neg Hx     Social History   Socioeconomic History   Marital status: Married    Spouse name: Not on file   Number of children: 0   Years of education: Not on file   Highest education level: Not on file  Occupational History   Occupation: Freight forwarder at [employer]    Employer: [employer]  Tobacco Use   Smoking status: Former    Packs/day: 2.00    Years: 32.00    Total pack years: 64.00    Types: Cigarettes    Quit date: 08/31/1999    Years since quitting: 22.7   Smokeless tobacco: Former  Scientific laboratory technician Use: Never used  Substance and Sexual Activity   Alcohol use: No    Alcohol/week: 0.0 standard drinks of alcohol   Drug use: No   Sexual activity: Yes  Other Topics Concern   Not on file  Social History Narrative   Not on file   Social Determinants of Health   Financial Resource Strain: Not on file  Food Insecurity: Not on file  Transportation Needs: Not on file  Physical Activity: Not on file  Stress: Not on file  Social Connections: Not on file  Intimate Partner Violence: Not on file  Objective:  Physical Exam: BP 134/80 (BP Location: Left Arm, Patient Position: Sitting, Cuff Size: Large)   Pulse 72   Temp 97.9 F (36.6 C) (Temporal)   Wt 195 lb (88.5 kg)   SpO2 92%   BMI 27.98 kg/m    Gen: NAD, resting comfortably HEENT: Moist mucous membranes, no oropharyngeal lesions CV: RRR with no murmurs appreciated Pulm: NWOB, CTAB with no crackles,  wheezes, or rhonchi GI: Normal bowel sounds present. Soft, Nontender, Nondistended. MSK: no edema, cyanosis, or clubbing noted Skin: warm, dry Neuro: grossly normal, moves all extremities Psych: Normal affect and thought content  Patient declined COVID, flu, strep throat testing.      Alesia Banda, MD, MS

## 2022-05-23 ENCOUNTER — Other Ambulatory Visit: Payer: Self-pay | Admitting: Family Medicine

## 2022-05-23 DIAGNOSIS — F341 Dysthymic disorder: Secondary | ICD-10-CM

## 2022-05-31 ENCOUNTER — Encounter: Payer: Self-pay | Admitting: Family Medicine

## 2022-05-31 ENCOUNTER — Ambulatory Visit (INDEPENDENT_AMBULATORY_CARE_PROVIDER_SITE_OTHER): Payer: BC Managed Care – PPO | Admitting: Family Medicine

## 2022-05-31 VITALS — BP 138/76 | HR 61 | Temp 97.7°F | Ht 70.0 in | Wt 195.4 lb

## 2022-05-31 DIAGNOSIS — M8588 Other specified disorders of bone density and structure, other site: Secondary | ICD-10-CM | POA: Diagnosis not present

## 2022-05-31 DIAGNOSIS — J22 Unspecified acute lower respiratory infection: Secondary | ICD-10-CM | POA: Diagnosis not present

## 2022-05-31 DIAGNOSIS — Z87891 Personal history of nicotine dependence: Secondary | ICD-10-CM

## 2022-05-31 DIAGNOSIS — R0982 Postnasal drip: Secondary | ICD-10-CM | POA: Diagnosis not present

## 2022-05-31 MED ORDER — DOXYCYCLINE HYCLATE 100 MG PO TABS: 100.0000 mg | ORAL_TABLET | Freq: Two times a day (BID) | ORAL | 0 refills | Status: AC

## 2022-05-31 MED ORDER — PREDNISONE 10 MG (21) PO TBPK: ORAL_TABLET | ORAL | 0 refills | Status: DC

## 2022-05-31 NOTE — Progress Notes (Signed)
Established Patient Office Visit   Subjective:  Patient ID: Hayden Deleon, male    DOB: [birth year]  Age: 67 y.o. MRN: [id number]  Chief Complaint  Patient presents with   Sinusitis    Sinus issues, cough symptoms x 2 weeks after shingles vaccine.     Sinusitis Associated symptoms include coughing. Pertinent negatives include no shortness of breath.   Encounter Diagnoses  Name Primary?   Post-nasal drip Yes   Lower respiratory tract infection    Osteopenia of lumbar spine    History of tobacco use    Persisting cough for 2 weeks now productive of clear to beige phlegm.  There have been no fevers or chills.  He has been using her rescue inhaler more frequently.  Postnasal drip has responded to the Flonase.  Flonase is causing some burning in his nostrils.  There has been no epistaxis.  Denies GERD symptoms.   Review of Systems  Constitutional: Negative.   HENT: Negative.  Negative for nosebleeds.   Eyes:  Negative for blurred vision, discharge and redness.  Respiratory:  Positive for cough, sputum production and wheezing. Negative for hemoptysis and shortness of breath.   Cardiovascular: Negative.   Gastrointestinal:  Negative for abdominal pain and heartburn.  Genitourinary: Negative.   Musculoskeletal: Negative.  Negative for myalgias.  Skin:  Negative for rash.  Neurological:  Negative for tingling, loss of consciousness and weakness.  Endo/Heme/Allergies:  Negative for polydipsia.     Current Outpatient Medications:    albuterol (VENTOLIN HFA) 108 (90 Base) MCG/ACT inhaler, TAKE 2 PUFFS BY MOUTH EVERY 6 HOURS AS NEEDED FOR WHEEZE OR SHORTNESS OF BREATH, Disp: 6.7 each, Rfl: 10   aspirin EC 325 MG EC tablet, Take 1 tablet (325 mg total) by mouth daily., Disp: 30 tablet, Rfl: 0   atorvastatin (LIPITOR) 80 MG tablet, TAKE 1 TABLET BY MOUTH EVERY DAY, Disp: 90 tablet, Rfl: 1   dextromethorphan-guaiFENesin (MUCINEX DM) 30-600 MG 12hr tablet, Take 1 tablet by mouth 2 (two)  times daily., Disp: , Rfl:    doxycycline (VIBRA-TABS) 100 MG tablet, Take 1 tablet (100 mg total) by mouth 2 (two) times daily for 10 days., Disp: 20 tablet, Rfl: 0   fluticasone (FLONASE) 50 MCG/ACT nasal spray, Place 2 sprays into both nostrils daily., Disp: 16 g, Rfl: 6   Multiple Vitamin (MULTIVITAMIN) capsule, Take 1 capsule by mouth daily., Disp: , Rfl:    predniSONE (STERAPRED UNI-PAK 21 TAB) 10 MG (21) TBPK tablet, Take 6 today, 5 tomorrow, 4 the next day and then 3, 2, 1 and stop, Disp: 21 tablet, Rfl: 0   sertraline (ZOLOFT) 50 MG tablet, TAKE 1 TABLET BY MOUTH EVERY DAY, Disp: 90 tablet, Rfl: 1   TRELEGY ELLIPTA 100-62.5-25 MCG/ACT AEPB, INHALE 1 PUFF BY MOUTH EVERY DAY, Disp: 180 each, Rfl: 2   benzocaine-menthol (CHLORASEPTIC SORE THROAT) 6-10 MG lozenge, Take 1 lozenge by mouth as needed for sore throat. (Patient not taking: Reported on 05/31/2022), Disp: 100 tablet, Rfl: 0   Objective:     BP 138/76 (BP Location: Right Arm, Patient Position: Sitting, Cuff Size: Normal)   Pulse 61   Temp 97.7 F (36.5 C) (Temporal)   Ht '5\' 10"'$  (1.778 m)   Wt 195 lb 6.4 oz (88.6 kg)   SpO2 92%   BMI 28.04 kg/m    Physical Exam   No results found for any visits on 05/31/22.    The ASCVD Risk score (Arnett DK, et al., 2019) failed  to calculate for the following reasons:   The valid total cholesterol range is 130 to 320 mg/dL    Assessment & Plan:   Post-nasal drip  Lower respiratory tract infection -     predniSONE; Take 6 today, 5 tomorrow, 4 the next day and then 3, 2, 1 and stop  Dispense: 21 tablet; Refill: 0 -     Doxycycline Hyclate; Take 1 tablet (100 mg total) by mouth 2 (two) times daily for 10 days.  Dispense: 20 tablet; Refill: 0 -     DG Chest 2 View; Future  Osteopenia of lumbar spine -     DG Bone Density; Future  History of tobacco use -     DG Bone Density; Future    Return if symptoms worsen or fail to improve.  Decrease Flonase usage to every other day.   Add nasal saline.  Will start a 6-day prednisone Dosepak.  Add doxycycline.  Checking chest x-ray today.  Bone densitometry ordered today for osteopenia noted in lumbar spine.  Libby Maw, MD

## 2022-06-13 ENCOUNTER — Ambulatory Visit (HOSPITAL_BASED_OUTPATIENT_CLINIC_OR_DEPARTMENT_OTHER)
Admission: RE | Admit: 2022-06-13 | Discharge: 2022-06-13 | Disposition: A | Discharge status: Home / Self Care | Payer: BC Managed Care – PPO | Source: Ambulatory Visit | Attending: Family Medicine | Admitting: Family Medicine

## 2022-06-13 DIAGNOSIS — Z87891 Personal history of nicotine dependence: Secondary | ICD-10-CM | POA: Diagnosis not present

## 2022-06-13 DIAGNOSIS — J22 Unspecified acute lower respiratory infection: Secondary | ICD-10-CM | POA: Insufficient documentation

## 2022-06-13 DIAGNOSIS — J9811 Atelectasis: Secondary | ICD-10-CM | POA: Diagnosis not present

## 2022-06-13 DIAGNOSIS — M8588 Other specified disorders of bone density and structure, other site: Secondary | ICD-10-CM | POA: Insufficient documentation

## 2022-06-13 DIAGNOSIS — Z13820 Encounter for screening for osteoporosis: Secondary | ICD-10-CM | POA: Diagnosis not present

## 2022-06-13 DIAGNOSIS — R059 Cough, unspecified: Secondary | ICD-10-CM | POA: Diagnosis not present

## 2022-06-13 DIAGNOSIS — J449 Chronic obstructive pulmonary disease, unspecified: Secondary | ICD-10-CM | POA: Diagnosis not present

## 2022-07-30 ENCOUNTER — Encounter: Payer: Self-pay | Admitting: Cardiovascular Disease

## 2022-07-30 ENCOUNTER — Ambulatory Visit: Payer: BC Managed Care – PPO | Attending: Cardiovascular Disease | Admitting: Cardiovascular Disease

## 2022-07-30 VITALS — BP 112/62 | HR 69 | Ht 70.0 in | Wt 191.2 lb

## 2022-07-30 DIAGNOSIS — I10 Essential (primary) hypertension: Secondary | ICD-10-CM | POA: Diagnosis not present

## 2022-07-30 DIAGNOSIS — E785 Hyperlipidemia, unspecified: Secondary | ICD-10-CM

## 2022-07-30 DIAGNOSIS — I6523 Occlusion and stenosis of bilateral carotid arteries: Secondary | ICD-10-CM

## 2022-07-30 DIAGNOSIS — I2510 Atherosclerotic heart disease of native coronary artery without angina pectoris: Secondary | ICD-10-CM | POA: Diagnosis not present

## 2022-07-30 NOTE — Progress Notes (Signed)
Cardiology Office Note   Date:  07/30/2022   ID:  Hayden Deleon, Hayden Deleon 11-Aug-1955, MRN [id number]  PCP:  Mliss Sax, MD  Cardiologist:   Lorine Bears, MD   Chief Complaint  Patient presents with   Follow-up    6 month f/u no complaints today. Meds reviewed verbally with pt.      History of Present Illness: Hayden Deleon is a 67 y.o. male who is here today for follow-up visit regarding coronary artery disease and stable angina. Previous cardiac catheterization in 2003 showed occluded OM1 with collaterals with 60-70% proximal RCA stenosis.  Ejection fraction was normal.  The patient has been managed medically since then.  He has known history of COPD and quit smoking in 2005.  He has hyperlipidemia, carotid disease and hypertension. He had worsening angina in 2022.  Lexiscan Myoview was abnormal with inferior lateral and apical ischemia.  Cardiac catheterization was done in August 2022 which showed severe ostial left main stenosis and subtotal occlusion of the mid right coronary artery with occluded OM1.  Ejection fraction was normal.  The patient underwent CABG at [facility] without complications. He had resolution of angina since then and has been doing extremely well.  He has mild shortness of breath related to COPD.  Echocardiogram in November 2023 showed hyperdynamic LV systolic function with no significant valvular abnormalities.    Past Medical History:  Diagnosis Date   Allergy    Anxiety    Arthritis    right shoulder   CAD (coronary artery disease)    a. 2003 Cath: OM1 100%, good collats, RCA 60-70p-->med rx; b. 04/2020 MV: mild ischemia to sm area of mid inflat & apical lat segments. EF >65%; d. 10/2020 Cath: LM 85ost, LAD 30p/m, OM1 100, RCA 99p/m; e. 10/2020 CABG x 3: LIMA->LAD, VG->RPDA, VG->OM2.   Carotid artery disease (HCC)    a. 03/2020 Carotid U/S: RICA 1-39%, LICA 40-59%; b. 01/2021 U/S: 40-59% bilat ICA stenoses.   COPD (chronic obstructive  pulmonary disease) (HCC)    Significant Dr. Delton Coombes   Diastolic dysfunction    a. 03/2018 Echo: EF 60-65%, no rwma, Gr1 DD. Mildly dil LA. Nl RV fxn.   Dilation of ascending aorta and aortic root (HCC)    a. 10/2020 Echo: Asc Ao 40mm.   Diverticulosis of colon    Emphysema of lung (HCC)    GERD (gastroesophageal reflux disease)    Gout    Hyperlipidemia    Hypertension    LVH (left ventricular hypertrophy)    a. 10/2020 Echo: EF 70-75%, no rwma, mod LVH. Nl RV size/fxn. Mildly dil RA. Triv MR. Asc Ao 40mm.   Prostatitis    Sleep apnea    uses cpap nightly    Past Surgical History:  Procedure Laterality Date   COLONOSCOPY  12/2003   [facility] - polyp   CORONARY ARTERY BYPASS GRAFT N/A 11/05/2020   Procedure: CORONARY ARTERY BYPASS GRAFTING (CABG) TIMES 3, USING LEFT INTERNAL MAMMARY ARTERY AND GREATER SAPHEOUS VEIN HARVESTED ENDOSCOPICALLY LEFT AND RIGHT LEGS;  Surgeon: Corliss Skains, MD;  Location: [facility];  Service: Open Heart Surgery;  Laterality: N/A;   deviated septum     KNEE ARTHROSCOPY     right   LEFT HEART CATH AND CORONARY ANGIOGRAPHY N/A 11/03/2020   Procedure: LEFT HEART CATH AND CORONARY ANGIOGRAPHY;  Surgeon: Iran Ouch, MD;  Location: [facility];  Service: Cardiovascular;  Laterality: N/A;   TOTAL SHOULDER ARTHROPLASTY  Right 02/03/2018   Procedure: RIGHT REVERSE SHOULDER ARTHROPLASTY;  Surgeon: Cammy Copa, MD;  Location: [facility];  Service: Orthopedics;  Laterality: Right;   WISDOM TOOTH EXTRACTION       Current Outpatient Medications  Medication Sig Dispense Refill   albuterol (VENTOLIN HFA) 108 (90 Base) MCG/ACT inhaler TAKE 2 PUFFS BY MOUTH EVERY 6 HOURS AS NEEDED FOR WHEEZE OR SHORTNESS OF BREATH 6.7 each 10   aspirin EC 325 MG EC tablet Take 1 tablet (325 mg total) by mouth daily. 30 tablet 0   atorvastatin (LIPITOR) 80 MG tablet TAKE 1 TABLET BY MOUTH EVERY DAY 90 tablet 1   benzocaine-menthol (CHLORASEPTIC SORE THROAT) 6-10 MG lozenge  Take 1 lozenge by mouth as needed for sore throat. 100 tablet 0   dextromethorphan-guaiFENesin (MUCINEX DM) 30-600 MG 12hr tablet Take 1 tablet by mouth 2 (two) times daily.     fluticasone (FLONASE) 50 MCG/ACT nasal spray Place 2 sprays into both nostrils daily. 16 g 6   Multiple Vitamin (MULTIVITAMIN) capsule Take 1 capsule by mouth daily.     sertraline (ZOLOFT) 50 MG tablet TAKE 1 TABLET BY MOUTH EVERY DAY 90 tablet 1   TRELEGY ELLIPTA 100-62.5-25 MCG/ACT AEPB INHALE 1 PUFF BY MOUTH EVERY DAY 180 each 2   No current facility-administered medications for this visit.    Allergies:   Avelox [moxifloxacin hcl in nacl], Sulfamethoxazole-trimethoprim, Imdur [isosorbide nitrate], and Aspirin    Social History:  The patient  reports that he quit smoking about 22 years ago. His smoking use included cigarettes. He has a 64.00 pack-year smoking history. He has quit using smokeless tobacco. He reports that he does not drink alcohol and does not use drugs.   Family History:  The patient's family history includes Angina in his mother; Asthma in his father; Crohn's disease in his sister.    ROS:  Please see the history of present illness.   Otherwise, review of systems are positive for none.   All other systems are reviewed and negative.    PHYSICAL EXAM: VS:  BP 112/62 (BP Location: Left Arm, Patient Position: Sitting, Cuff Size: Normal)   Pulse 69   Ht 5\' 10"  (1.778 m)   Wt 191 lb 4 oz (86.8 kg)   SpO2 96%   BMI 27.44 kg/m  , BMI Body mass index is 27.44 kg/m. GEN: Well nourished, well developed, in no acute distress  HEENT: normal  Neck: no JVD, carotid bruits, or masses Cardiac: RRR; no  rubs, or gallops,no edema . 1/6 holosystolic murmur at the apex. Respiratory:  clear to auscultation bilaterally, normal work of breathing GI: soft, nontender, nondistended, + BS MS: no deformity or atrophy  Skin: warm and dry, no rash Neuro:  Strength and sensation are intact Psych: euthymic mood,  full affect   EKG:  EKG is ordered today. The ekg ordered today demonstrates normal sinus rhythm with no significant ST or T wave changes.   Recent Labs: 05/15/2022: ALT 14; BUN 12; Creatinine, Ser 0.94; Hemoglobin 15.6; Platelets 184.0; Potassium 4.3; Sodium 141    Lipid Panel    Component Value Date/Time   CHOL 127 05/15/2022 1007   TRIG 144.0 05/15/2022 1007   HDL 46.90 05/15/2022 1007   CHOLHDL 3 05/15/2022 1007   VLDL 28.8 05/15/2022 1007   LDLCALC 51 05/15/2022 1007   LDLDIRECT 71.0 01/03/2016 1201      Wt Readings from Last 3 Encounters:  07/30/22 191 lb 4 oz (86.8 kg)  05/31/22 195  lb 6.4 oz (88.6 kg)  05/22/22 195 lb (88.5 kg)          01/17/2016    3:51 PM  PAD Screen  Previous PAD dx? No  Previous surgical procedure? No  Pain with walking? Yes  Subsides with rest? Yes  Feet/toe relief with dangling? No  Painful, non-healing ulcers? No  Extremities discolored? No      ASSESSMENT AND PLAN:  1.  Coronary artery disease involving native coronary arteries without angina.  He is doing extremely well post CABG with resolution of anginal symptoms.  Continue aspirin indefinitely.  2.  Hyperlipidemia: Continue treatment with atorvastatin.  I reviewed most recent lipid profile done in February which showed an LDL of 51.  3.  Essential hypertension: He is no longer on antihypertensive medications and his blood pressure is normal.  4.  Bilateral carotid stenosis: Most recent carotid Doppler in December 2023 showed stable moderate carotid stenosis.  Repeat study in December of this year.    Disposition:   FU with me in 12 months  Signed,   Lorine Bears, MD  07/30/2022 4:12 PM    [facility]

## 2022-07-30 NOTE — Patient Instructions (Signed)
Medication Instructions:  No changes *If you need a refill on your cardiac medications before your next appointment, please call your pharmacy*   Lab Work: None ordered If you have labs (blood work) drawn today and your tests are completely normal, you will receive your results only by: MyChart Message (if you have MyChart) OR A paper copy in the mail If you have any lab test that is abnormal or we need to change your treatment, we will call you to review the results.   Testing/Procedures: Your physician has requested that you have a carotid duplex in December. This test is an ultrasound of the carotid arteries in your neck. It looks at blood flow through these arteries that supply the brain with blood.   Allow one hour for this exam.  There are no restrictions or special instructions.  This will take place at [address] [address], [place] [address]    Follow-Up: At [facility], you and your health needs are our priority.  As part of our continuing mission to provide you with exceptional heart care, we have created designated Provider Care Teams.  These Care Teams include your primary Cardiologist (physician) and Advanced Practice Providers (APPs -  Physician Assistants and Nurse Practitioners) who all work together to provide you with the care you need, when you need it.  We recommend signing up for the patient portal called "MyChart".  Sign up information is provided on this After Visit Summary.  MyChart is used to connect with patients for Virtual Visits (Telemedicine).  Patients are able to view lab/test results, encounter notes, upcoming appointments, etc.  Non-urgent messages can be sent to your provider as well.   To learn more about what you can do with MyChart, go to [email or web address].    Your next appointment:   12 month(s)  Provider:   You may see Lorine Bears, MD or one of the following Advanced Practice Providers on your  designated Care Team:   Nicolasa Ducking, NP Eula Listen, PA-C Cadence Fransico Michael, PA-C Charlsie Quest, NP

## 2022-08-12 ENCOUNTER — Other Ambulatory Visit: Payer: Self-pay | Admitting: Cardiovascular Disease

## 2022-08-22 DIAGNOSIS — M47816 Spondylosis without myelopathy or radiculopathy, lumbar region: Secondary | ICD-10-CM | POA: Diagnosis not present

## 2022-08-23 ENCOUNTER — Other Ambulatory Visit: Payer: Self-pay | Admitting: Rehabilitation

## 2022-08-23 DIAGNOSIS — M47816 Spondylosis without myelopathy or radiculopathy, lumbar region: Secondary | ICD-10-CM

## 2022-09-09 ENCOUNTER — Encounter: Payer: Self-pay | Admitting: Rehabilitation

## 2022-09-10 ENCOUNTER — Ambulatory Visit
Admission: RE | Admit: 2022-09-10 | Discharge: 2022-09-10 | Disposition: A | Discharge status: Home / Self Care | Payer: BC Managed Care – PPO | Source: Ambulatory Visit | Attending: Rehabilitation | Admitting: Rehabilitation

## 2022-09-10 DIAGNOSIS — M48061 Spinal stenosis, lumbar region without neurogenic claudication: Secondary | ICD-10-CM | POA: Diagnosis not present

## 2022-09-10 DIAGNOSIS — M47816 Spondylosis without myelopathy or radiculopathy, lumbar region: Secondary | ICD-10-CM

## 2022-09-16 ENCOUNTER — Encounter: Payer: Self-pay | Admitting: Internal Medicine

## 2022-09-16 ENCOUNTER — Ambulatory Visit (INDEPENDENT_AMBULATORY_CARE_PROVIDER_SITE_OTHER): Payer: BC Managed Care – PPO | Admitting: Internal Medicine

## 2022-09-16 VITALS — BP 126/72 | HR 65 | Temp 98.1°F | Ht 71.0 in | Wt 195.4 lb

## 2022-09-16 DIAGNOSIS — J449 Chronic obstructive pulmonary disease, unspecified: Secondary | ICD-10-CM

## 2022-09-16 DIAGNOSIS — G4733 Obstructive sleep apnea (adult) (pediatric): Secondary | ICD-10-CM | POA: Diagnosis not present

## 2022-09-16 NOTE — Progress Notes (Signed)
[facility]  Pulmonary Medicine Consultation        Date: 09/16/2022  MRN# [id number] Hayden Deleon 03-Dec-1955    Hayden Deleon is a 67 y.o. old male seen in consultation for chief complaint of:  SYNOPSIS Established care for COPD and OSA  **CPAP download 07/2019 Excellent compliance report CPAP set at 7 AHI down to 2.6 93% for greater than 4 hours 9% for days  **CPAP download 05/22/2018- 08/19/2018.>>  Raw data present reviewed.  Average usage on days used is 6 hours 40 minutes.  Usage greater than 4 hours is 77/90 days.  CPAP set at 7.  Leaks are within normal limits.  Residual AHI is 2.  Overall this shows good compliance with CPAP with excellent control obstructive sleep apnea. **Pneumovax administered 12/30/16 **PFT 12/24/16; Spirometry: FVC was 62% of predicted, FEV1 was 33% of predicted, ratio is 43%. Flow volume loop is consistent with obstruction. There is significant improvement with bronchodilator therapy. Lung volumes: TLC is 78% of predicted, RV to TLC ratio was normal. DLCO is 49% of predicted. -Overall this test is consistent with severe COPD with reversibility, there may also be an element of mild restrictive lung disease.  **CBC 08/22/16; Eos=600 during an episode of AECOPD. At baseline he is at 300.   **Alpha-1 09/30/16>> normal.  **CT chest 09/06/16 shows severe diffuse panlobular emphysema, with some left basilar scarring, left pleural thickening, small effusion with leftward mediastinal shift.. **Sleep study 05/30/12>>AHI 35  12/28/2012 PSG showed -AHI 28 times/h  He did not sleep much on CPAP - start CPAP 7cm , pillows, humidity,  Download 10/28/12 on 7 cm - AHI 5/h, good usage, no leak  LAST OV DOWNLOAD excellent compliance report Patient uses CPAP 7 cm water pressure 90% compliance for greater than 4 hours 93% compliance for days there is no leak uses nasal pillows Previous AHI was 35    CC FOLLOW  UP OSA FOLLOW COPD   HPI:   RE COPD Follow-up  assessment of COPD Shortness of breath and dyspnea on exertion is stable Currently on Trelegy inhaler Gold stage D with previous COPD exacerbations Previous history of RSV pneumonia  No exacerbation at this time No evidence of heart failure at this time No evidence or signs of infection at this time No respiratory distress No fevers, chills, nausea, vomiting, diarrhea No evidence of lower extremity edema No evidence hemoptysis  Regarding OSA Previous download shows excellent compliance with well-controlled sleep apnea AHI reduced to 2 CPAP 7 Excellent compliance Uses and benefits from CPAP therapy Previous AHI is 35    s/p CABG  Doing well post op    BP 126/72 (BP Location: Left Arm, Cuff Size: Normal)   Pulse 65   Temp 98.1 F (36.7 C) (Temporal)   Ht 5\' 11"  (1.803 m)   Wt 195 lb 6.4 oz (88.6 kg)   SpO2 95%   BMI 27.25 kg/m      Review of Systems: Gen:  Denies  fever, sweats, chills weight loss  HEENT: Denies blurred vision, double vision, ear pain, eye pain, hearing loss, nose bleeds, sore throat Cardiac:  No dizziness, chest pain or heaviness, chest tightness,edema, No JVD Resp:   No cough, -sputum production, -shortness of breath,-wheezing, -hemoptysis,  Other:  All other systems negative   Physical Examination:   General Appearance: No distress  EYES PERRLA, EOM intact.   NECK Supple, No JVD Pulmonary: normal breath sounds, No wheezing.  CardiovascularNormal S1,S2.  No m/r/g.   Abdomen: Benign,  Soft, non-tender. Neurology UE/LE 5/5 strength, no focal deficits Ext pulses intact, cap refill intact ALL OTHER ROS ARE NEGATIVE       Allergies:  Avelox [moxifloxacin hcl in nacl], Sulfamethoxazole-trimethoprim, Imdur [isosorbide nitrate], and Aspirin   Assessment and Plan:  67 year old pleasant white male seen today for follow-up assessment for severe COPD based on pulmonary function testing with FEV1 31% predicted with underlying severe sleep  apnea in setting of deconditioned state   COPD stable  Severe based on pulmonary function testing  Gold stage D FEV1 33% Alpha-1 levels normal  Continue inhalers as prescribed  Trelegy and albuterol as needed   Regarding OSA  Excellent compliance report  Reviewed in detail with patient  AHI significantly reduced  Continue as prescribed   S/p CABG Follow up cardiothoracic surgery  Obesity -recommend significant weight loss -recommend changing diet  Deconditioned state -Recommend increased daily activity and exercise    MEDICATION ADJUSTMENTS/LABS AND TESTS ORDERED: Continue Trelegy Ventolin HFA as needed Continue weight loss Continue CPAP therapy Avoid secondhand smoke Avoid SICK contacts Please Mask when appropriate Please Keep up-to-date with vaccinations  Patient satisfied with Plan of action and management. All questions answered  Follow-up in 1 year  Total time spent 24 minutes    Nosson Wender Santiago Glad, M.D.  [facility]  Medical Director [facility] Medical Director [facility] Cardio-Pulmonary Department

## 2022-09-16 NOTE — Patient Instructions (Signed)
Excellent job A+  Continue CPAP as prescribed Continue inhalers as prescribed  Avoid secondhand smoke Avoid SICK contacts Recommend  Masking  when appropriate Recommend Keep up-to-date with vaccinations

## 2022-09-22 ENCOUNTER — Other Ambulatory Visit: Payer: Self-pay | Admitting: Internal Medicine

## 2022-09-22 DIAGNOSIS — J4489 Other specified chronic obstructive pulmonary disease: Secondary | ICD-10-CM

## 2022-11-13 ENCOUNTER — Other Ambulatory Visit: Payer: Self-pay | Admitting: Nurse Practitioner

## 2022-11-13 DIAGNOSIS — J4489 Other specified chronic obstructive pulmonary disease: Secondary | ICD-10-CM

## 2022-12-26 ENCOUNTER — Ambulatory Visit: Payer: BC Managed Care – PPO | Admitting: Internal Medicine

## 2022-12-26 VITALS — BP 130/80 | HR 57 | Temp 98.0°F | Ht 71.0 in | Wt 203.2 lb

## 2022-12-26 DIAGNOSIS — J441 Chronic obstructive pulmonary disease with (acute) exacerbation: Secondary | ICD-10-CM | POA: Diagnosis not present

## 2022-12-26 MED ORDER — AZITHROMYCIN 250 MG PO TABS: ORAL_TABLET | ORAL | 0 refills | Status: AC

## 2022-12-26 MED ORDER — PREDNISONE 10 MG (21) PO TBPK: ORAL_TABLET | ORAL | 0 refills | Status: DC

## 2022-12-26 NOTE — Patient Instructions (Signed)
Restart mucinex

## 2022-12-26 NOTE — Progress Notes (Signed)
[facility] [address] [place] [address] Dept: [phone] Dept Fax: [phone]  Acute Care Office Visit  Subjective:   Hayden Deleon 02-11-1956 12/26/2022  Chief Complaint  Patient presents with   COPD    Congestion and lungs filled with fluid Started 2 weeks ago     HPI: Hayden Deleon is a 67 yo M with a PMHx of COPD who reports increased productive cough of white-beige sputum while using his trellegy inhaler each morning, and increased SHOB with exertion onset 1-2 weeks ago. Associated wheezing. He takes Trellegy and Albuterol. Has had to use albuterol inhaler more frequently recently. He has hx of PNA. No chronic long term oxygen use.  Denies fever, chills, CP, palpitations, LE edema.   The following portions of the patient's history were reviewed and updated as appropriate: past medical history, past surgical history, family history, social history, allergies, medications, and problem list.   Patient Active Problem List   Diagnosis Date Noted   Post-nasal drip 05/31/2022   History of tobacco use 05/31/2022   Osteopenia of lumbar spine 05/31/2022   Right-sided low back pain without sciatica 05/15/2022   Lower respiratory tract infection 03/07/2022   Reactive airway disease 02/15/2022   Dysthymia 10/16/2021   Bilateral carotid artery stenosis 10/16/2021   S/P CABG (coronary artery bypass graft) 11/05/2020   S/P CABG x 3 11/05/2020   Unstable angina (HCC) 11/03/2020   History of gout 12/13/2019   Healthcare maintenance 12/13/2019   Coronary artery disease of native artery of native heart with stable angina pectoris (HCC) 12/21/2018   Shoulder arthritis 02/03/2018   Gout 01/21/2018   Low testosterone 01/21/2018   BPV (benign positional vertigo), left 07/10/2017   Cough 06/06/2017   B12 deficiency 04/02/2017   COPD with acute exacerbation (HCC) 10/07/2014   Inguinal hernia of right side without  obstruction or gangrene 07/31/2012   OSA (obstructive sleep apnea) 05/01/2012   CAD (coronary artery disease)    Carotid artery disease (HCC)    COPD (chronic obstructive pulmonary disease) with chronic bronchitis (HCC)    GERD (gastroesophageal reflux disease)    Ejection fraction    MIXED HYPERLIPIDEMIA 05/03/2010   BENIGN POSITIONAL VERTIGO 05/03/2010   Anxiety state 11/10/2008   ANGIOEDEMA 08/18/2007   SYSTOLIC MURMUR 05/22/2007   DYSPHAGIA UNSPECIFIED 05/22/2007   PROSTATITIS, HX OF 05/22/2007   ADVEF, DRUG/MEDICINAL/BIOLOGICAL SUBST NOS 01/26/2007   COLONIC POLYPS 12/31/2003   GASTROESOPHAGEAL REFLUX DISEASE, CHRONIC 12/31/2003   HIATAL HERNIA 12/31/2003   DIVERTICULOSIS, COLON 12/31/2003   HLD (hyperlipidemia) 05/02/2001   HTN (hypertension) 05/02/2001   Past Medical History:  Diagnosis Date   Allergy    Anxiety    Arthritis    right shoulder   CAD (coronary artery disease)    a. 2003 Cath: OM1 100%, good collats, RCA 60-70p-->med rx; b. 04/2020 MV: mild ischemia to sm area of mid inflat & apical lat segments. EF >65%; d. 10/2020 Cath: LM 85ost, LAD 30p/m, OM1 100, RCA 99p/m; e. 10/2020 CABG x 3: LIMA->LAD, VG->RPDA, VG->OM2.   Carotid artery disease (HCC)    a. 03/2020 Carotid U/S: RICA 1-39%, LICA 40-59%; b. 01/2021 U/S: 40-59% bilat ICA stenoses.   COPD (chronic obstructive pulmonary disease) (HCC)    Significant Dr. Delton Coombes   Diastolic dysfunction    a. 03/2018 Echo: EF 60-65%, no rwma, Gr1 DD. Mildly dil LA. Nl RV fxn.   Dilation of ascending aorta and aortic root (HCC)  a. 10/2020 Echo: Asc Ao 40mm.   Diverticulosis of colon    Emphysema of lung (HCC)    GERD (gastroesophageal reflux disease)    Gout    Hyperlipidemia    Hypertension    LVH (left ventricular hypertrophy)    a. 10/2020 Echo: EF 70-75%, no rwma, mod LVH. Nl RV size/fxn. Mildly dil RA. Triv MR. Asc Ao 40mm.   Prostatitis    Sleep apnea    uses cpap nightly   Past Surgical History:  Procedure  Laterality Date   COLONOSCOPY  12/2003   patterson - polyp   CORONARY ARTERY BYPASS GRAFT N/A 11/05/2020   Procedure: CORONARY ARTERY BYPASS GRAFTING (CABG) TIMES 3, USING LEFT INTERNAL MAMMARY ARTERY AND GREATER SAPHEOUS VEIN HARVESTED ENDOSCOPICALLY LEFT AND RIGHT LEGS;  Surgeon: Corliss Skains, MD;  Location: [facility] OR;  Service: Open Heart Surgery;  Laterality: N/A;   deviated septum     KNEE ARTHROSCOPY     right   LEFT HEART CATH AND CORONARY ANGIOGRAPHY N/A 11/03/2020   Procedure: LEFT HEART CATH AND CORONARY ANGIOGRAPHY;  Surgeon: Iran Ouch, MD;  Location: [facility] INVASIVE CV LAB;  Service: Cardiovascular;  Laterality: N/A;   TOTAL SHOULDER ARTHROPLASTY Right 02/03/2018   Procedure: RIGHT REVERSE SHOULDER ARTHROPLASTY;  Surgeon: Cammy Copa, MD;  Location: [facility] OR;  Service: Orthopedics;  Laterality: Right;   WISDOM TOOTH EXTRACTION     Family History  Problem Relation Age of Onset   Angina Mother        car accident   Asthma Father    Crohn's disease Sister    Colon cancer Neg Hx    Esophageal cancer Neg Hx    Rectal cancer Neg Hx    Stomach cancer Neg Hx     Current Outpatient Medications:    albuterol (VENTOLIN HFA) 108 (90 Base) MCG/ACT inhaler, TAKE 2 PUFFS BY MOUTH EVERY 6 HOURS AS NEEDED FOR WHEEZE OR SHORTNESS OF BREATH, Disp: 6.7 each, Rfl: 10   aspirin EC 325 MG EC tablet, Take 1 tablet (325 mg total) by mouth daily., Disp: 30 tablet, Rfl: 0   atorvastatin (LIPITOR) 80 MG tablet, TAKE 1 TABLET BY MOUTH EVERY DAY, Disp: 90 tablet, Rfl: 1   azithromycin (ZITHROMAX) 250 MG tablet, Take 2 tablets on day 1, then 1 tablet daily on days 2 through 5, Disp: 6 tablet, Rfl: 0   benzocaine-menthol (CHLORASEPTIC SORE THROAT) 6-10 MG lozenge, Take 1 lozenge by mouth as needed for sore throat., Disp: 100 tablet, Rfl: 0   dextromethorphan-guaiFENesin (MUCINEX DM) 30-600 MG 12hr tablet, Take 1 tablet by mouth 2 (two) times daily., Disp: , Rfl:    fluticasone (FLONASE) 50  MCG/ACT nasal spray, Place 2 sprays into both nostrils daily., Disp: 16 g, Rfl: 6   Multiple Vitamin (MULTIVITAMIN) capsule, Take 1 capsule by mouth daily., Disp: , Rfl:    predniSONE (STERAPRED UNI-PAK 21 TAB) 10 MG (21) TBPK tablet, Take as directed on packaging., Disp: 21 tablet, Rfl: 0   sertraline (ZOLOFT) 50 MG tablet, TAKE 1 TABLET BY MOUTH EVERY DAY, Disp: 90 tablet, Rfl: 1   TRELEGY ELLIPTA 100-62.5-25 MCG/ACT AEPB, INHALE 1 PUFF BY MOUTH EVERY DAY, Disp: 180 each, Rfl: 2 Allergies  Allergen Reactions   Avelox [Moxifloxacin Hcl In Nacl] Other (See Comments)    Facial swelling   Sulfamethoxazole-Trimethoprim Other (See Comments)    REACTION: sore mouth   Imdur [Isosorbide Nitrate] Other (See Comments)    Made pt feel groggy, loopy, bad  dreams    Aspirin Other (See Comments)    REACTION: Nausea; fine with 81mg   01/06/22 patient states that he is tolerating 325 mg     ROS: A complete ROS was performed with pertinent positives/negatives noted in the HPI. The remainder of the ROS are negative.    Objective:   Today's Vitals   12/26/22 1321  BP: 130/80  Pulse: (!) 57  Temp: 98 F (36.7 C)  TempSrc: Temporal  SpO2: 96%  Weight: 203 lb 3.2 oz (92.2 kg)  Height: 5\' 11"  (1.803 m)    GENERAL: Well-appearing, in NAD. Well nourished.  SKIN: Pink, warm and dry. No rash.  NECK: Trachea midline. Full ROM w/o pain or tenderness. No lymphadenopathy.  RESPIRATORY: Chest wall symmetrical. Respirations even and non-labored. Breath sounds clear to auscultation bilaterally. (+) cough  CARDIAC: S1, S2 present, regular rate and rhythm. Peripheral pulses 2+ bilaterally.  EXTREMITIES: Without clubbing, cyanosis, or edema.  NEUROLOGIC:  Steady, even gait.  PSYCH/MENTAL STATUS: Alert, oriented x 3. Cooperative, appropriate mood and affect.    No results found for any visits on 12/26/22.    Assessment & Plan:  1. COPD with acute exacerbation (HCC) - azithromycin (ZITHROMAX) 250 MG tablet;  Take 2 tablets on day 1, then 1 tablet daily on days 2 through 5  Dispense: 6 tablet; Refill: 0 - predniSONE (STERAPRED UNI-PAK 21 TAB) 10 MG (21) TBPK tablet; Take as directed on packaging.  Dispense: 21 tablet; Refill: 0 - Mucinex  Continue trelegy and albuterol inhalers  Meds ordered this encounter  Medications   azithromycin (ZITHROMAX) 250 MG tablet    Sig: Take 2 tablets on day 1, then 1 tablet daily on days 2 through 5    Dispense:  6 tablet    Refill:  0    Order Specific Question:   Supervising Provider    Answer:   Garnette Gunner [[id number]]   predniSONE (STERAPRED UNI-PAK 21 TAB) 10 MG (21) TBPK tablet    Sig: Take as directed on packaging.    Dispense:  21 tablet    Refill:  0    Order Specific Question:   Supervising Provider    Answer:   Garnette Gunner [[id number]]   No orders of the defined types were placed in this encounter.  Lab Orders  No laboratory test(s) ordered today   No images are attached to the encounter or orders placed in the encounter.  Return for Scheduled Routine Office Visits and as needed.   Salvatore Decent, FNP

## 2022-12-31 ENCOUNTER — Ambulatory Visit: Payer: BC Managed Care – PPO | Admitting: Family Medicine

## 2023-01-05 ENCOUNTER — Other Ambulatory Visit: Payer: Self-pay | Admitting: Nurse Practitioner

## 2023-01-05 ENCOUNTER — Other Ambulatory Visit: Payer: Self-pay | Admitting: Cardiovascular Disease

## 2023-01-05 ENCOUNTER — Other Ambulatory Visit: Payer: Self-pay | Admitting: Family Medicine

## 2023-01-05 DIAGNOSIS — J4489 Other specified chronic obstructive pulmonary disease: Secondary | ICD-10-CM

## 2023-01-05 DIAGNOSIS — F341 Dysthymic disorder: Secondary | ICD-10-CM

## 2023-01-08 NOTE — Telephone Encounter (Signed)
Requesting: ALBUTEROL HFA (PROVENTIL) INH  Last Visit: 12/26/2022 Next Visit: Visit date not found Last Refill: 11/01/2021  Please Advise

## 2023-01-14 ENCOUNTER — Encounter: Payer: Self-pay | Admitting: Internal Medicine

## 2023-01-23 ENCOUNTER — Ambulatory Visit (INDEPENDENT_AMBULATORY_CARE_PROVIDER_SITE_OTHER): Payer: BC Managed Care – PPO | Admitting: Family Medicine

## 2023-01-23 ENCOUNTER — Encounter: Payer: Self-pay | Admitting: Family Medicine

## 2023-01-23 VITALS — BP 136/64 | HR 65 | Temp 97.8°F | Ht 71.0 in | Wt 203.0 lb

## 2023-01-23 DIAGNOSIS — R42 Dizziness and giddiness: Secondary | ICD-10-CM

## 2023-01-23 DIAGNOSIS — J4489 Other specified chronic obstructive pulmonary disease: Secondary | ICD-10-CM

## 2023-01-23 NOTE — Progress Notes (Signed)
Established Patient Office Visit   Subjective:  Patient ID: Hayden Deleon, male    DOB: 1955-08-07  Age: 67 y.o. MRN: [id number]  Chief Complaint  Patient presents with   Dizziness   Respiratory Distress    Dizziness Pertinent negatives include no abdominal pain, coughing, myalgias, rash or weakness.   Encounter Diagnoses  Name Primary?   Lightheadedness Yes   COPD (chronic obstructive pulmonary disease) with chronic bronchitis (HCC)    Reports a 1 week history of lightheadedness when standing.  There is no spinning sensation.  O2 sats drop with regular activity down to 90% but then come back up after resting.  He is compliant with his inhalers.  No history of hypotension.    Review of Systems  Constitutional: Negative.   HENT: Negative.    Eyes:  Negative for blurred vision, discharge and redness.  Respiratory: Negative.  Negative for cough and sputum production.   Cardiovascular: Negative.   Gastrointestinal:  Negative for abdominal pain.  Genitourinary: Negative.   Musculoskeletal: Negative.  Negative for myalgias.  Skin:  Negative for rash.  Neurological:  Positive for dizziness. Negative for tingling, loss of consciousness and weakness.  Endo/Heme/Allergies:  Negative for polydipsia.     Current Outpatient Medications:    albuterol (VENTOLIN HFA) 108 (90 Base) MCG/ACT inhaler, TAKE 2 PUFFS BY MOUTH EVERY 6 HOURS AS NEEDED FOR WHEEZE OR SHORTNESS OF BREATH, Disp: 6.7 each, Rfl: 0   aspirin EC 325 MG EC tablet, Take 1 tablet (325 mg total) by mouth daily., Disp: 30 tablet, Rfl: 0   atorvastatin (LIPITOR) 80 MG tablet, TAKE 1 TABLET BY MOUTH EVERY DAY, Disp: 90 tablet, Rfl: 1   benzocaine-menthol (CHLORASEPTIC SORE THROAT) 6-10 MG lozenge, Take 1 lozenge by mouth as needed for sore throat., Disp: 100 tablet, Rfl: 0   dextromethorphan-guaiFENesin (MUCINEX DM) 30-600 MG 12hr tablet, Take 1 tablet by mouth 2 (two) times daily., Disp: , Rfl:    fluticasone (FLONASE) 50  MCG/ACT nasal spray, Place 2 sprays into both nostrils daily., Disp: 16 g, Rfl: 6   Multiple Vitamin (MULTIVITAMIN) capsule, Take 1 capsule by mouth daily., Disp: , Rfl:    sertraline (ZOLOFT) 50 MG tablet, TAKE 1 TABLET BY MOUTH EVERY DAY, Disp: 90 tablet, Rfl: 1   TRELEGY ELLIPTA 100-62.5-25 MCG/ACT AEPB, INHALE 1 PUFF BY MOUTH EVERY DAY, Disp: 180 each, Rfl: 2   predniSONE (STERAPRED UNI-PAK 21 TAB) 10 MG (21) TBPK tablet, Take as directed on packaging. (Patient not taking: Reported on 01/23/2023), Disp: 21 tablet, Rfl: 0   Objective:     BP 136/64   Pulse 65   Temp 97.8 F (36.6 C) (Temporal)   Ht 5\' 11"  (1.803 m)   Wt 203 lb (92.1 kg)   SpO2 96%   BMI 28.31 kg/m    Physical Exam Constitutional:      General: He is not in acute distress.    Appearance: Normal appearance. He is not ill-appearing, toxic-appearing or diaphoretic.  HENT:     Head: Normocephalic and atraumatic.     Right Ear: External ear normal.     Left Ear: External ear normal.     Mouth/Throat:     Mouth: Mucous membranes are moist.     Pharynx: Oropharynx is clear. No oropharyngeal exudate or posterior oropharyngeal erythema.  Eyes:     General: No scleral icterus.       Right eye: No discharge.        Left eye: No discharge.  Extraocular Movements: Extraocular movements intact.     Conjunctiva/sclera: Conjunctivae normal.     Pupils: Pupils are equal, round, and reactive to light.     Comments: Strabismus   Cardiovascular:     Rate and Rhythm: Normal rate and regular rhythm.  Pulmonary:     Effort: Pulmonary effort is normal. No respiratory distress.     Breath sounds: Normal breath sounds. Decreased air movement present. No wheezing, rhonchi or rales.  Abdominal:     General: Bowel sounds are normal.     Tenderness: There is no abdominal tenderness. There is no guarding.  Musculoskeletal:     Cervical back: No rigidity or tenderness.  Skin:    General: Skin is warm and dry.  Neurological:      Mental Status: He is alert and oriented to person, place, and time.  Psychiatric:        Mood and Affect: Mood normal.        Behavior: Behavior normal.      No results found for any visits on 01/23/23.    The ASCVD Risk score (Arnett DK, et al., 2019) failed to calculate for the following reasons:   The valid total cholesterol range is 130 to 320 mg/dL    Assessment & Plan:   Lightheadedness -     Basic metabolic panel -     CBC -     Urinalysis, Routine w reflex microscopic  COPD (chronic obstructive pulmonary disease) with chronic bronchitis (HCC)    Return if symptoms worsen or fail to improve.  Patient did not tilt with orthostatics.  Checking CBC, BMP and urinalysis.  Continue follow-up with pulmonology for COPD.  May need to consider supplemental oxygen.  Mliss Sax, MD

## 2023-01-24 ENCOUNTER — Encounter: Payer: Self-pay | Admitting: Family Medicine

## 2023-01-24 LAB — CBC
HCT: 48.3 % (ref 39.0–52.0)
Hemoglobin: 16.1 g/dL (ref 13.0–17.0)
MCHC: 33.2 g/dL (ref 30.0–36.0)
MCV: 98.3 fL (ref 78.0–100.0)
Platelets: 184 10*3/uL (ref 150.0–400.0)
RBC: 4.92 Mil/uL (ref 4.22–5.81)
RDW: 13.5 % (ref 11.5–15.5)
WBC: 7.6 10*3/uL (ref 4.0–10.5)

## 2023-01-24 LAB — BASIC METABOLIC PANEL
BUN: 17 mg/dL (ref 6–23)
CO2: 28 meq/L (ref 19–32)
Calcium: 9.5 mg/dL (ref 8.4–10.5)
Chloride: 104 meq/L (ref 96–112)
Creatinine, Ser: 0.94 mg/dL (ref 0.40–1.50)
GFR: 84.1 mL/min (ref >60.00)
Glucose, Bld: 60 mg/dL — ABNORMAL LOW (ref 70–99)
Potassium: 4.4 meq/L (ref 3.5–5.1)
Sodium: 142 meq/L (ref 135–145)

## 2023-01-24 LAB — URINALYSIS, ROUTINE W REFLEX MICROSCOPIC
Bilirubin Urine: NEGATIVE
Hgb urine dipstick: NEGATIVE
Ketones, ur: NEGATIVE
Nitrite: NEGATIVE
Specific Gravity, Urine: >=1.03 — AB (ref 1.000–1.030)
Total Protein, Urine: NEGATIVE
Urine Glucose: NEGATIVE
Urobilinogen, UA: 0.2 (ref 0.0–1.0)
pH: 6 (ref 5.0–8.0)

## 2023-03-13 ENCOUNTER — Telehealth: Payer: Self-pay | Admitting: Internal Medicine

## 2023-03-13 DIAGNOSIS — J4489 Other specified chronic obstructive pulmonary disease: Secondary | ICD-10-CM

## 2023-03-13 MED ORDER — TRELEGY ELLIPTA 100-62.5-25 MCG/ACT IN AEPB: 1.0000 | INHALATION_SPRAY | Freq: Every day | RESPIRATORY_TRACT | 3 refills | Status: DC

## 2023-03-13 NOTE — Telephone Encounter (Signed)
Refill has been sent in and I have notified the patient.  Nothing further needed.

## 2023-03-13 NOTE — Telephone Encounter (Signed)
Pt is asking for refill for his Trelegy to be sent to [facility] on [address]

## 2023-03-17 ENCOUNTER — Ambulatory Visit (INDEPENDENT_AMBULATORY_CARE_PROVIDER_SITE_OTHER): Payer: BC Managed Care – PPO | Admitting: Internal Medicine

## 2023-03-17 ENCOUNTER — Encounter: Payer: Self-pay | Admitting: Internal Medicine

## 2023-03-17 DIAGNOSIS — J441 Chronic obstructive pulmonary disease with (acute) exacerbation: Secondary | ICD-10-CM | POA: Diagnosis not present

## 2023-03-17 MED ORDER — PREDNISONE 10 MG (21) PO TBPK
ORAL_TABLET | ORAL | 0 refills | Status: DC
Start: 2023-03-17 — End: 2023-07-01

## 2023-03-17 MED ORDER — AZITHROMYCIN 250 MG PO TABS: ORAL_TABLET | ORAL | 0 refills | Status: AC

## 2023-03-17 NOTE — Progress Notes (Signed)
[facility] PRIMARY CARE [facility] [address] [place] [address] Dept: [phone] Dept Fax: [phone]  Acute Care Office Visit  Subjective:   Hayden Deleon 1956/03/16 03/17/2023  Chief Complaint  Patient presents with   Breathing Problem    Not breathing well - ongoing comes and goes     HPI: Discussed the use of AI scribe software for clinical note transcription with the patient, who gave verbal consent to proceed.  History of Present Illness   The patient, with a history of COPD, presents with worsening respiratory symptoms since Thursday. They report a lot of wheezing and have been monitoring their oxygen saturation, which dropped to 79 after running errands on Sunday but has since improved to the 90s. They deny fever, chills, and chest pain but admit to some sinus congestion, which is usually managed with Flonase. The patient describes their cough as productive with beige-colored sputum. They have been using their albuterol inhaler as needed and started taking Mucinex DM since the onset of these symptoms. They continue to take Trelegy daily for their COPD.      The following portions of the patient's history were reviewed and updated as appropriate: past medical history, past surgical history, family history, social history, allergies, medications, and problem list.   Patient Active Problem List   Diagnosis Date Noted   Post-nasal drip 05/31/2022   History of tobacco use 05/31/2022   Osteopenia of lumbar spine 05/31/2022   Right-sided low back pain without sciatica 05/15/2022   Lower respiratory tract infection 03/07/2022   Reactive airway disease 02/15/2022   Dysthymia 10/16/2021   Bilateral carotid artery stenosis 10/16/2021   S/P CABG (coronary artery bypass graft) 11/05/2020   S/P CABG x 3 11/05/2020   Unstable angina (HCC) 11/03/2020   History of gout 12/13/2019   Healthcare maintenance 12/13/2019   Coronary artery  disease of native artery of native heart with stable angina pectoris (HCC) 12/21/2018   Shoulder arthritis 02/03/2018   Gout 01/21/2018   Low testosterone 01/21/2018   Lightheadedness 07/10/2017   BPV (benign positional vertigo), left 07/10/2017   Cough 06/06/2017   B12 deficiency 04/02/2017   COPD with acute exacerbation (HCC) 10/07/2014   Inguinal hernia of right side without obstruction or gangrene 07/31/2012   OSA (obstructive sleep apnea) 05/01/2012   CAD (coronary artery disease)    Carotid artery disease (HCC)    COPD (chronic obstructive pulmonary disease) with chronic bronchitis (HCC)    GERD (gastroesophageal reflux disease)    Ejection fraction    MIXED HYPERLIPIDEMIA 05/03/2010   BENIGN POSITIONAL VERTIGO 05/03/2010   Anxiety state 11/10/2008   Angioedema 08/18/2007   SYSTOLIC MURMUR 05/22/2007   Dysphagia 05/22/2007   PROSTATITIS, HX OF 05/22/2007   ADVEF, DRUG/MEDICINAL/BIOLOGICAL SUBST NOS 01/26/2007   COLONIC POLYPS 12/31/2003   GASTROESOPHAGEAL REFLUX DISEASE, CHRONIC 12/31/2003   Diaphragmatic hernia 12/31/2003   Diverticulosis of colon 12/31/2003   HLD (hyperlipidemia) 05/02/2001   HTN (hypertension) 05/02/2001   Past Medical History:  Diagnosis Date   Allergy    Anxiety    Arthritis    right shoulder   CAD (coronary artery disease)    a. 2003 Cath: OM1 100%, good collats, RCA 60-70p-->med rx; b. 04/2020 MV: mild ischemia to sm area of mid inflat & apical lat segments. EF >65%; d. 10/2020 Cath: LM 85ost, LAD 30p/m, OM1 100, RCA 99p/m; e. 10/2020 CABG x 3: LIMA->LAD, VG->RPDA, VG->OM2.   Carotid artery disease (HCC)    a. 03/2020  Carotid U/S: RICA 1-39%, LICA 40-59%; b. 01/2021 U/S: 40-59% bilat ICA stenoses.   COPD (chronic obstructive pulmonary disease) (HCC)    Significant Dr. Delton Coombes   Diastolic dysfunction    a. 03/2018 Echo: EF 60-65%, no rwma, Gr1 DD. Mildly dil LA. Nl RV fxn.   Dilation of ascending aorta and aortic root (HCC)    a. 10/2020 Echo: Asc  Ao 40mm.   Diverticulosis of colon    Emphysema of lung (HCC)    GERD (gastroesophageal reflux disease)    Gout    Hyperlipidemia    Hypertension    LVH (left ventricular hypertrophy)    a. 10/2020 Echo: EF 70-75%, no rwma, mod LVH. Nl RV size/fxn. Mildly dil RA. Triv MR. Asc Ao 40mm.   Prostatitis    Sleep apnea    uses cpap nightly   Past Surgical History:  Procedure Laterality Date   COLONOSCOPY  12/2003   patterson - polyp   CORONARY ARTERY BYPASS GRAFT N/A 11/05/2020   Procedure: CORONARY ARTERY BYPASS GRAFTING (CABG) TIMES 3, USING LEFT INTERNAL MAMMARY ARTERY AND GREATER SAPHEOUS VEIN HARVESTED ENDOSCOPICALLY LEFT AND RIGHT LEGS;  Surgeon: Corliss Skains, MD;  Location: [facility] OR;  Service: Open Heart Surgery;  Laterality: N/A;   deviated septum     KNEE ARTHROSCOPY     right   LEFT HEART CATH AND CORONARY ANGIOGRAPHY N/A 11/03/2020   Procedure: LEFT HEART CATH AND CORONARY ANGIOGRAPHY;  Surgeon: Iran Ouch, MD;  Location: [facility] INVASIVE CV LAB;  Service: Cardiovascular;  Laterality: N/A;   TOTAL SHOULDER ARTHROPLASTY Right 02/03/2018   Procedure: RIGHT REVERSE SHOULDER ARTHROPLASTY;  Surgeon: Cammy Copa, MD;  Location: [facility] OR;  Service: Orthopedics;  Laterality: Right;   WISDOM TOOTH EXTRACTION     Family History  Problem Relation Age of Onset   Angina Mother        car accident   Asthma Father    Crohn's disease Sister    Colon cancer Neg Hx    Esophageal cancer Neg Hx    Rectal cancer Neg Hx    Stomach cancer Neg Hx     Current Outpatient Medications:    albuterol (VENTOLIN HFA) 108 (90 Base) MCG/ACT inhaler, TAKE 2 PUFFS BY MOUTH EVERY 6 HOURS AS NEEDED FOR WHEEZE OR SHORTNESS OF BREATH, Disp: 6.7 each, Rfl: 0   aspirin EC 325 MG EC tablet, Take 1 tablet (325 mg total) by mouth daily., Disp: 30 tablet, Rfl: 0   atorvastatin (LIPITOR) 80 MG tablet, TAKE 1 TABLET BY MOUTH EVERY DAY, Disp: 90 tablet, Rfl: 1   azithromycin (ZITHROMAX) 250 MG tablet, Take 2  tablets on day 1, then 1 tablet daily on days 2 through 5, Disp: 6 tablet, Rfl: 0   fluticasone (FLONASE) 50 MCG/ACT nasal spray, Place 2 sprays into both nostrils daily., Disp: 16 g, Rfl: 6   Fluticasone-Umeclidin-Vilant (TRELEGY ELLIPTA) 100-62.5-25 MCG/ACT AEPB, Inhale 1 puff into the lungs daily., Disp: 180 each, Rfl: 3   Multiple Vitamin (MULTIVITAMIN) capsule, Take 1 capsule by mouth daily., Disp: , Rfl:    sertraline (ZOLOFT) 50 MG tablet, TAKE 1 TABLET BY MOUTH EVERY DAY, Disp: 90 tablet, Rfl: 1   benzocaine-menthol (CHLORASEPTIC SORE THROAT) 6-10 MG lozenge, Take 1 lozenge by mouth as needed for sore throat. (Patient not taking: Reported on 03/17/2023), Disp: 100 tablet, Rfl: 0   dextromethorphan-guaiFENesin (MUCINEX DM) 30-600 MG 12hr tablet, Take 1 tablet by mouth 2 (two) times daily., Disp: , Rfl:  predniSONE (STERAPRED UNI-PAK 21 TAB) 10 MG (21) TBPK tablet, Take as directed on packaging., Disp: 21 tablet, Rfl: 0 Allergies  Allergen Reactions   Avelox [Moxifloxacin Hcl In Nacl] Other (See Comments)    Facial swelling   Sulfamethoxazole-Trimethoprim Other (See Comments)    REACTION: sore mouth   Imdur [Isosorbide Nitrate] Other (See Comments)    Made pt feel groggy, loopy, bad dreams    Aspirin Other (See Comments)    REACTION: Nausea; fine with 81mg   01/06/22 patient states that he is tolerating 325 mg     ROS: A complete ROS was performed with pertinent positives/negatives noted in the HPI. The remainder of the ROS are negative.    Objective:   Today's Vitals   03/17/23 0804  BP: 136/72  Pulse: 65  Temp: 98.5 F (36.9 C)  TempSrc: Temporal  SpO2: 94%  Weight: 205 lb (93 kg)  Height: 5\' 11"  (1.803 m)    GENERAL: Well-appearing, in NAD. Well nourished.  SKIN: Pink, warm and dry. No rash, lesion, ulceration, or ecchymoses.  NECK: Trachea midline. Full ROM w/o pain or tenderness. No lymphadenopathy.  RESPIRATORY: Chest wall symmetrical. Respirations even and  non-labored. Faint expiratory wheeze bilaterally. (+)cough CARDIAC: S1, S2 present, regular rate and rhythm. Peripheral pulses 2+ bilaterally.  EXTREMITIES: Without clubbing, cyanosis, or edema.  NEUROLOGIC:  Steady, even gait.  PSYCH/MENTAL STATUS: Alert, oriented x 3. Cooperative, appropriate mood and affect.    No results found for any visits on 03/17/23.    Assessment & Plan:  Assessment and Plan    COPD Exacerbation Increased wheezing and decreased oxygen saturation since Thursday, with some improvement today. No fever, chills, or chest pain. Productive cough with beige sputum. No exposure to sick contacts. -Start Prednisone and Azithromycin (Z-Pak). -Continue Albuterol as needed. -Continue Mucinex DM. -Continue daily Trelegy. -Return for follow-up if symptoms worsen or do not improve, at which point a chest x-ray may be considered.      Meds ordered this encounter  Medications   azithromycin (ZITHROMAX) 250 MG tablet    Sig: Take 2 tablets on day 1, then 1 tablet daily on days 2 through 5    Dispense:  6 tablet    Refill:  0    Supervising Provider:   Garnette Gunner [[id number]]   predniSONE (STERAPRED UNI-PAK 21 TAB) 10 MG (21) TBPK tablet    Sig: Take as directed on packaging.    Dispense:  21 tablet    Refill:  0    Supervising Provider:   Garnette Gunner [[id number]]   No orders of the defined types were placed in this encounter.  Lab Orders  No laboratory test(s) ordered today   No images are attached to the encounter or orders placed in the encounter.  Return if symptoms worsen or fail to improve.   Salvatore Decent, FNP

## 2023-03-17 NOTE — Patient Instructions (Signed)
Continue albuterol inhaler as needed  Continue mucinex dm

## 2023-03-28 ENCOUNTER — Ambulatory Visit: Payer: BC Managed Care – PPO | Attending: Nurse Practitioner

## 2023-03-28 DIAGNOSIS — I6523 Occlusion and stenosis of bilateral carotid arteries: Secondary | ICD-10-CM

## 2023-04-03 ENCOUNTER — Other Ambulatory Visit: Payer: Self-pay | Admitting: *Deleted

## 2023-04-03 DIAGNOSIS — I6523 Occlusion and stenosis of bilateral carotid arteries: Secondary | ICD-10-CM

## 2023-06-02 ENCOUNTER — Encounter: Payer: Self-pay | Admitting: Internal Medicine

## 2023-06-02 DIAGNOSIS — G4733 Obstructive sleep apnea (adult) (pediatric): Secondary | ICD-10-CM

## 2023-06-02 NOTE — Telephone Encounter (Signed)
 Okay to place an order for a new machine?

## 2023-06-04 ENCOUNTER — Other Ambulatory Visit (INDEPENDENT_AMBULATORY_CARE_PROVIDER_SITE_OTHER): Payer: Self-pay

## 2023-06-04 ENCOUNTER — Ambulatory Visit: Payer: BC Managed Care – PPO | Admitting: Orthopedic Surgery

## 2023-06-04 DIAGNOSIS — M542 Cervicalgia: Secondary | ICD-10-CM

## 2023-06-04 DIAGNOSIS — G8929 Other chronic pain: Secondary | ICD-10-CM | POA: Diagnosis not present

## 2023-06-04 DIAGNOSIS — M25511 Pain in right shoulder: Secondary | ICD-10-CM | POA: Diagnosis not present

## 2023-06-05 ENCOUNTER — Encounter: Payer: Self-pay | Admitting: Orthopedic Surgery

## 2023-06-05 NOTE — Progress Notes (Signed)
 Office Visit Note   Patient: Hayden Deleon           Date of Birth: Aug 17, 1955           MRN: [id number] Visit Date: 06/04/2023 Requested by: Mliss Sax, MD [address] [place],  [place] [address] PCP: Mliss Sax, MD  Subjective: Chief Complaint  Patient presents with   Right Shoulder - Pain   Neck - Pain    HPI: Hayden Deleon is a 68 y.o. male who presents to the office reporting right shoulder neck pain.  No history of injury.  Did have right shoulder radiographs done in 2019.  Cervical spine radiographs done in 2021.  Patient does describe pain and swelling in the shoulder neck region.  The MRI scan in 2021 showed moderate to severe stenosis at C3-4 and C6-7.  There is also severe right-sided C4-5 foraminal stenosis at that time.  Patient reports some neck and shoulder popping.  Takes Tylenol for pain.  Had reverse shoulder replacement in 2019.  Does describe numbness in the neck radiating down to the elbow.  States that the right shoulder "feels swollen inside".  No real pain..                ROS: All systems reviewed are negative as they relate to the chief complaint within the history of present illness.  Patient denies fevers or chills.  Assessment & Plan: Visit Diagnoses:  1. Chronic right shoulder pain   2. Neck pain     Plan: Impression is likely cervical spine origin for this right shoulder and arm pain.  His reverse replacement looks very good and is functioning well in terms of motion and strength.  MRI of the cervical spine indicated to evaluate progression of known right sided foraminal stenosis and he should follow-up with Dr. Christell Constant after that intervention.  Follow-Up Instructions: No follow-ups on file.   Orders:  Orders Placed This Encounter  Procedures   XR Shoulder Right   XR Cervical Spine 2 or 3 views   No orders of the defined types were placed in this encounter.     Procedures: No procedures  performed   Clinical Data: No additional findings.  Objective: Vital Signs: There were no vitals taken for this visit.  Physical Exam:  Constitutional: Patient appears well-developed HEENT:  Head: Normocephalic Eyes:EOM are normal Neck: Normal range of motion Cardiovascular: Normal rate Pulmonary/chest: Effort normal Neurologic: Patient is alert Skin: Skin is warm Psychiatric: Patient has normal mood and affect  Ortho Exam: Ortho exam demonstrates pretty reasonable cervical spine range of motion.  5 out of 5 grip EPL FPL interosseous are/extension bicep triceps and deltoid strength.  Passive range of motion of that shoulder is above 90 degrees of forward flexion and abduction.  Mild paresthesias in the C6 distribution on the right compared to the left.  Radial pulse intact bilaterally.  No lymphadenopathy around the right shoulder and there is no warmth in the right shoulder region.  Specialty Comments:  No specialty comments available.  Imaging: No results found.   PMFS History: Patient Active Problem List   Diagnosis Date Noted   Post-nasal drip 05/31/2022   History of tobacco use 05/31/2022   Osteopenia of lumbar spine 05/31/2022   Right-sided low back pain without sciatica 05/15/2022   Lower respiratory tract infection 03/07/2022   Reactive airway disease 02/15/2022   Dysthymia 10/16/2021   Bilateral carotid artery stenosis 10/16/2021   S/P CABG (coronary artery  bypass graft) 11/05/2020   S/P CABG x 3 11/05/2020   Unstable angina (HCC) 11/03/2020   History of gout 12/13/2019   Healthcare maintenance 12/13/2019   Coronary artery disease of native artery of native heart with stable angina pectoris (HCC) 12/21/2018   Shoulder arthritis 02/03/2018   Gout 01/21/2018   Low testosterone 01/21/2018   Lightheadedness 07/10/2017   BPV (benign positional vertigo), left 07/10/2017   Cough 06/06/2017   B12 deficiency 04/02/2017   COPD with acute exacerbation (HCC)  10/07/2014   Inguinal hernia of right side without obstruction or gangrene 07/31/2012   OSA (obstructive sleep apnea) 05/01/2012   CAD (coronary artery disease)    Carotid artery disease (HCC)    COPD (chronic obstructive pulmonary disease) with chronic bronchitis (HCC)    GERD (gastroesophageal reflux disease)    Ejection fraction    MIXED HYPERLIPIDEMIA 05/03/2010   BENIGN POSITIONAL VERTIGO 05/03/2010   Anxiety state 11/10/2008   Angioedema 08/18/2007   SYSTOLIC MURMUR 05/22/2007   Dysphagia 05/22/2007   PROSTATITIS, HX OF 05/22/2007   ADVEF, DRUG/MEDICINAL/BIOLOGICAL SUBST NOS 01/26/2007   COLONIC POLYPS 12/31/2003   GASTROESOPHAGEAL REFLUX DISEASE, CHRONIC 12/31/2003   Diaphragmatic hernia 12/31/2003   Diverticulosis of colon 12/31/2003   HLD (hyperlipidemia) 05/02/2001   HTN (hypertension) 05/02/2001   Past Medical History:  Diagnosis Date   Allergy    Anxiety    Arthritis    right shoulder   CAD (coronary artery disease)    a. 2003 Cath: OM1 100%, good collats, RCA 60-70p-->med rx; b. 04/2020 MV: mild ischemia to sm area of mid inflat & apical lat segments. EF >65%; d. 10/2020 Cath: LM 85ost, LAD 30p/m, OM1 100, RCA 99p/m; e. 10/2020 CABG x 3: LIMA->LAD, VG->RPDA, VG->OM2.   Carotid artery disease (HCC)    a. 03/2020 Carotid U/S: RICA 1-39%, LICA 40-59%; b. 01/2021 U/S: 40-59% bilat ICA stenoses.   COPD (chronic obstructive pulmonary disease) (HCC)    Significant Dr. Delton Coombes   Diastolic dysfunction    a. 03/2018 Echo: EF 60-65%, no rwma, Gr1 DD. Mildly dil LA. Nl RV fxn.   Dilation of ascending aorta and aortic root (HCC)    a. 10/2020 Echo: Asc Ao 40mm.   Diverticulosis of colon    Emphysema of lung (HCC)    GERD (gastroesophageal reflux disease)    Gout    Hyperlipidemia    Hypertension    LVH (left ventricular hypertrophy)    a. 10/2020 Echo: EF 70-75%, no rwma, mod LVH. Nl RV size/fxn. Mildly dil RA. Triv MR. Asc Ao 40mm.   Prostatitis    Sleep apnea    uses cpap  nightly    Family History  Problem Relation Age of Onset   Angina Mother        car accident   Asthma Father    Crohn's disease Sister    Colon cancer Neg Hx    Esophageal cancer Neg Hx    Rectal cancer Neg Hx    Stomach cancer Neg Hx     Past Surgical History:  Procedure Laterality Date   COLONOSCOPY  12/2003   patterson - polyp   CORONARY ARTERY BYPASS GRAFT N/A 11/05/2020   Procedure: CORONARY ARTERY BYPASS GRAFTING (CABG) TIMES 3, USING LEFT INTERNAL MAMMARY ARTERY AND GREATER SAPHEOUS VEIN HARVESTED ENDOSCOPICALLY LEFT AND RIGHT LEGS;  Surgeon: Corliss Skains, MD;  Location: [facility] OR;  Service: Open Heart Surgery;  Laterality: N/A;   deviated septum     KNEE ARTHROSCOPY  right   LEFT HEART CATH AND CORONARY ANGIOGRAPHY N/A 11/03/2020   Procedure: LEFT HEART CATH AND CORONARY ANGIOGRAPHY;  Surgeon: Iran Ouch, MD;  Location: [facility] INVASIVE CV LAB;  Service: Cardiovascular;  Laterality: N/A;   TOTAL SHOULDER ARTHROPLASTY Right 02/03/2018   Procedure: RIGHT REVERSE SHOULDER ARTHROPLASTY;  Surgeon: Cammy Copa, MD;  Location: [facility] OR;  Service: Orthopedics;  Laterality: Right;   WISDOM TOOTH EXTRACTION     Social History   Occupational History   Occupation: Production designer, 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

## 2023-06-08 ENCOUNTER — Encounter: Payer: Self-pay | Admitting: Orthopedic Surgery

## 2023-06-09 ENCOUNTER — Other Ambulatory Visit: Payer: Self-pay

## 2023-06-09 DIAGNOSIS — M542 Cervicalgia: Secondary | ICD-10-CM

## 2023-06-20 DIAGNOSIS — G4733 Obstructive sleep apnea (adult) (pediatric): Secondary | ICD-10-CM | POA: Diagnosis not present

## 2023-06-24 ENCOUNTER — Other Ambulatory Visit: Payer: Self-pay | Admitting: Cardiovascular Disease

## 2023-06-24 ENCOUNTER — Other Ambulatory Visit: Payer: Self-pay | Admitting: Family Medicine

## 2023-06-24 DIAGNOSIS — F341 Dysthymic disorder: Secondary | ICD-10-CM

## 2023-06-25 ENCOUNTER — Encounter: Payer: Self-pay | Admitting: Orthopedic Surgery

## 2023-06-27 ENCOUNTER — Other Ambulatory Visit

## 2023-06-28 ENCOUNTER — Ambulatory Visit
Admission: RE | Admit: 2023-06-28 | Discharge: 2023-06-28 | Disposition: A | Discharge status: Home / Self Care | Source: Ambulatory Visit | Attending: Orthopedic Surgery | Admitting: Orthopedic Surgery

## 2023-06-28 ENCOUNTER — Other Ambulatory Visit

## 2023-06-28 DIAGNOSIS — M542 Cervicalgia: Secondary | ICD-10-CM

## 2023-06-30 ENCOUNTER — Ambulatory Visit (HOSPITAL_BASED_OUTPATIENT_CLINIC_OR_DEPARTMENT_OTHER): Admitting: Student

## 2023-06-30 ENCOUNTER — Encounter (HOSPITAL_BASED_OUTPATIENT_CLINIC_OR_DEPARTMENT_OTHER): Payer: Self-pay | Admitting: Student

## 2023-06-30 ENCOUNTER — Ambulatory Visit (HOSPITAL_BASED_OUTPATIENT_CLINIC_OR_DEPARTMENT_OTHER)

## 2023-06-30 DIAGNOSIS — M79645 Pain in left finger(s): Secondary | ICD-10-CM | POA: Diagnosis not present

## 2023-06-30 DIAGNOSIS — M65312 Trigger thumb, left thumb: Secondary | ICD-10-CM | POA: Diagnosis not present

## 2023-06-30 DIAGNOSIS — M25532 Pain in left wrist: Secondary | ICD-10-CM

## 2023-06-30 DIAGNOSIS — M19042 Primary osteoarthritis, left hand: Secondary | ICD-10-CM | POA: Diagnosis not present

## 2023-06-30 NOTE — Progress Notes (Signed)
 Chief Complaint: Left wrist pain     History of Present Illness:    Hayden Deleon is a 68 y.o. right-hand-dominant male presenting today for evaluation of left wrist pain.  He reports that 2 days ago he was reaching behind his back when he felt a pop at the base of his left thumb.  This did immediately cause some discomfort and reports that he has had some swelling at the base of the thumb extending into the radial aspect of the dorsal wrist.  States that the pain has been manageable which she describes as a dull burning sensation and has tried icing but is not taking pain medication.  He does retain good function and strength.   Surgical History:   None  PMH/PSH/Family History/Social History/Meds/Allergies:    Past Medical History:  Diagnosis Date   Allergy    Anxiety    Arthritis    right shoulder   CAD (coronary artery disease)    a. 2003 Cath: OM1 100%, good collats, RCA 60-70p-->med rx; b. 04/2020 MV: mild ischemia to sm area of mid inflat & apical lat segments. EF >65%; d. 10/2020 Cath: LM 85ost, LAD 30p/m, OM1 100, RCA 99p/m; e. 10/2020 CABG x 3: LIMA->LAD, VG->RPDA, VG->OM2.   Carotid artery disease (HCC)    a. 03/2020 Carotid U/S: RICA 1-39%, LICA 40-59%; b. 01/2021 U/S: 40-59% bilat ICA stenoses.   COPD (chronic obstructive pulmonary disease) (HCC)    Significant Dr. Delton Coombes   Diastolic dysfunction    a. 03/2018 Echo: EF 60-65%, no rwma, Gr1 DD. Mildly dil LA. Nl RV fxn.   Dilation of ascending aorta and aortic root (HCC)    a. 10/2020 Echo: Asc Ao 40mm.   Diverticulosis of colon    Emphysema of lung (HCC)    GERD (gastroesophageal reflux disease)    Gout    Hyperlipidemia    Hypertension    LVH (left ventricular hypertrophy)    a. 10/2020 Echo: EF 70-75%, no rwma, mod LVH. Nl RV size/fxn. Mildly dil RA. Triv MR. Asc Ao 40mm.   Prostatitis    Sleep apnea    uses cpap nightly   Past Surgical History:  Procedure Laterality Date    COLONOSCOPY  12/2003   patterson - polyp   CORONARY ARTERY BYPASS GRAFT N/A 11/05/2020   Procedure: CORONARY ARTERY BYPASS GRAFTING (CABG) TIMES 3, USING LEFT INTERNAL MAMMARY ARTERY AND GREATER SAPHEOUS VEIN HARVESTED ENDOSCOPICALLY LEFT AND RIGHT LEGS;  Surgeon: Corliss Skains, MD;  Location: [facility] OR;  Service: Open Heart Surgery;  Laterality: N/A;   deviated septum     KNEE ARTHROSCOPY     right   LEFT HEART CATH AND CORONARY ANGIOGRAPHY N/A 11/03/2020   Procedure: LEFT HEART CATH AND CORONARY ANGIOGRAPHY;  Surgeon: Iran Ouch, MD;  Location: [facility] INVASIVE CV LAB;  Service: Cardiovascular;  Laterality: N/A;   TOTAL SHOULDER ARTHROPLASTY Right 02/03/2018   Procedure: RIGHT REVERSE SHOULDER ARTHROPLASTY;  Surgeon: Cammy Copa, MD;  Location: [facility] OR;  Service: Orthopedics;  Laterality: Right;   WISDOM TOOTH EXTRACTION     Social History   Socioeconomic History   Marital status: Married    Spouse name: Not on file   Number of children: 0   Years of education: Not on file   Highest education level: Bachelor's degree (  e.g., BA, AB, BS)  Occupational History   Occupation: Production designer, 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    Employer: [employer]  Tobacco Use   Smoking status: Former    Current packs/day: 0.00    Average packs/day: 2.0 packs/day for 32.0 years (64.0 ttl pk-yrs)    Types: Cigarettes    Start date: 08/31/1967    Quit date: 08/31/1999    Years since quitting: 23.8   Smokeless tobacco: Former  Building services engineer status: Never Used  Substance and Sexual Activity   Alcohol use: No    Alcohol/week: 0.0 standard drinks of alcohol   Drug use: No   Sexual activity: Yes  Other Topics Concern   Not on file  Social History Narrative   Not on file   Social Drivers of Health   Financial Resource Strain: Low Risk  (03/14/2023)   Overall Financial Resource Strain (CARDIA)    Difficulty of Paying Living Expenses: Not hard at all  Food Insecurity: No Food Insecurity  (03/14/2023)   Hunger Vital Sign    Worried About Running Out of Food in the Last Year: Never true    Ran Out of Food in the Last Year: Never true  Transportation Needs: No Transportation Needs (03/14/2023)   PRAPARE - Administrator, Civil Service (Medical): No    Lack of Transportation (Non-Medical): No  Physical Activity: Sufficiently Active (03/14/2023)   Exercise Vital Sign    Days of Exercise per Week: 4 days    Minutes of Exercise per Session: 40 min  Stress: No Stress Concern Present (03/14/2023)   [employer] of Occupational Health - Occupational Stress Questionnaire    Feeling of Stress : Not at all  Social Connections: Socially Integrated (03/14/2023)   Social Connection and Isolation Panel [NHANES]    Frequency of Communication with Friends and Family: More than three times a week    Frequency of Social Gatherings with Friends and Family: More than three times a week    Attends Religious Services: More than 4 times per year    Active Member of [employer] or Organizations: Yes    Attends Engineer, structural: More than 4 times per year    Marital Status: Married   Family History  Problem Relation Age of Onset   Angina Mother        car accident   Asthma Father    Crohn's disease Sister    Colon cancer Neg Hx    Esophageal cancer Neg Hx    Rectal cancer Neg Hx    Stomach cancer Neg Hx    Allergies  Allergen Reactions   Avelox [Moxifloxacin Hcl In Nacl] Other (See Comments)    Facial swelling   Sulfamethoxazole-Trimethoprim Other (See Comments)    REACTION: sore mouth   Imdur [Isosorbide Nitrate] Other (See Comments)    Made pt feel groggy, loopy, bad dreams    Aspirin Other (See Comments)    REACTION: Nausea; fine with 81mg   01/06/22 patient states that he is tolerating 325 mg   Current Outpatient Medications  Medication Sig Dispense Refill   albuterol (VENTOLIN HFA) 108 (90 Base) MCG/ACT inhaler TAKE 2 PUFFS BY MOUTH EVERY 6 HOURS AS  NEEDED FOR WHEEZE OR SHORTNESS OF BREATH 6.7 each 0   aspirin EC 325 MG EC tablet Take 1 tablet (325 mg total) by mouth daily. 30 tablet 0   atorvastatin (LIPITOR) 80 MG tablet TAKE 1 TABLET BY MOUTH EVERY DAY 90 tablet 3  benzocaine-menthol (CHLORASEPTIC SORE THROAT) 6-10 MG lozenge Take 1 lozenge by mouth as needed for sore throat. (Patient not taking: Reported on 03/17/2023) 100 tablet 0   dextromethorphan-guaiFENesin (MUCINEX DM) 30-600 MG 12hr tablet Take 1 tablet by mouth 2 (two) times daily.     fluticasone (FLONASE) 50 MCG/ACT nasal spray Place 2 sprays into both nostrils daily. 16 g 6   Fluticasone-Umeclidin-Vilant (TRELEGY ELLIPTA) 100-62.5-25 MCG/ACT AEPB Inhale 1 puff into the lungs daily. 180 each 3   Multiple Vitamin (MULTIVITAMIN) capsule Take 1 capsule by mouth daily.     predniSONE (STERAPRED UNI-PAK 21 TAB) 10 MG (21) TBPK tablet Take as directed on packaging. 21 tablet 0   sertraline (ZOLOFT) 50 MG tablet TAKE 1 TABLET BY MOUTH EVERY DAY 90 tablet 1   No current facility-administered medications for this visit.   No results found.  Review of Systems:   A ROS was performed including pertinent positives and negatives as documented in the HPI.  Physical Exam :   Constitutional: NAD and appears stated age Neurological: Alert and oriented Psych: Appropriate affect and cooperative There were no vitals taken for this visit.   Comprehensive Musculoskeletal Exam:    Left wrist exam demonstrates tenderness over the first [facility] joint and dorsal radial wrist.  Full thumb ROM at the IP, MCP, and CMC.  No snuffbox tenderness.  Mild pain with Finkelstein's test.  Radial pulse 2+.  Grip strength 5/5 bilaterally.  Imaging:   Xray (left thumb 3 views): Mild to moderate osteoarthritis of the CMC, MCP, and IP joints without evidence of acute abnormality.   I personally reviewed and interpreted the radiographs.   Assessment:   68 y.o. male with acute pain of the left thumb and  dorsal radial wrist brought on by popping sensation 2 days ago.  X-rays show some diffuse arthritis, which show I discussed could be a factor in causing popping and swelling, particularly of the [facility] joint.  Difficult to determine if he may have some element of de Quervain's given that thumb pain travels into the wrist.  Regardless I do think he would benefit from some anti-inflammatories as well as trialing a brace until symptoms improve.  Should he not see improvement within the next 1 to 2 weeks, discussed we could consider injection versus further workup, but at this time he does not show any strength or range of motion deficits.  Patient is agreeable to plan.  Plan :    -Return to clinic as needed     I personally saw and evaluated the patient, and participated in the management and treatment plan.  Hazle Nordmann, PA-C Orthopedics

## 2023-07-01 ENCOUNTER — Ambulatory Visit (INDEPENDENT_AMBULATORY_CARE_PROVIDER_SITE_OTHER): Admitting: Internal Medicine

## 2023-07-01 ENCOUNTER — Encounter: Payer: Self-pay | Admitting: Internal Medicine

## 2023-07-01 VITALS — BP 130/70 | HR 68 | Temp 98.7°F | Ht 71.0 in | Wt 206.2 lb

## 2023-07-01 DIAGNOSIS — R051 Acute cough: Secondary | ICD-10-CM | POA: Diagnosis not present

## 2023-07-01 DIAGNOSIS — J441 Chronic obstructive pulmonary disease with (acute) exacerbation: Secondary | ICD-10-CM | POA: Diagnosis not present

## 2023-07-01 LAB — POC INFLUENZA A&B (BINAX/QUICKVUE)
Influenza A, POC: NEGATIVE
Influenza B, POC: NEGATIVE

## 2023-07-01 LAB — POC COVID19 BINAXNOW: SARS Coronavirus 2 Ag: NEGATIVE

## 2023-07-01 MED ORDER — AZITHROMYCIN 250 MG PO TABS: ORAL_TABLET | ORAL | 0 refills | Status: AC

## 2023-07-01 NOTE — Progress Notes (Signed)
 [facility] [address] [place] [address] Dept: [phone] Dept Fax: [phone]  Acute Care Office Visit  Subjective:   Hayden Deleon 1956-03-24 07/01/2023  Chief Complaint  Patient presents with   Cough    Started Sunday     HPI: Discussed the use of AI scribe software for clinical note transcription with the patient, who gave verbal consent to proceed.  History of Present Illness   The patient, with a history of COPD, presents with an acute cough x 3 days. He spent Saturday outside, and the pollen was reportedly high. The following day, he woke up with a 'nagging, terrible cough.' He also reports a fever of 101 degrees Fahrenheit yesterday, which has resolved today. He reports sinus and chest congestion. Denies bodyaches, N/V/D, worsening wheezing or SHOB.  His oxygen saturation, typically 92-94%, has been 89% at home per patient at times.  He reports no increased shortness of breath or wheezing beyond his baseline. He has been taking NyQuil at night to help with sleep and a combination of Advil and Tylenol during the day. He continues to take his daily aspirin and use his Trelegy inhaler. He has not been using Mucinex DM recently but plans to start again. He has been using his albuterol inhaler about twice a day, which is his usual frequency.      The following portions of the patient's history were reviewed and updated as appropriate: past medical history, past surgical history, family history, social history, allergies, medications, and problem list.   Patient Active Problem List   Diagnosis Date Noted   Post-nasal drip 05/31/2022   History of tobacco use 05/31/2022   Osteopenia of lumbar spine 05/31/2022   Right-sided low back pain without sciatica 05/15/2022   Lower respiratory tract infection 03/07/2022   Reactive airway disease 02/15/2022   Dysthymia 10/16/2021   Bilateral carotid artery stenosis  10/16/2021   S/P CABG (coronary artery bypass graft) 11/05/2020   S/P CABG x 3 11/05/2020   Unstable angina (HCC) 11/03/2020   History of gout 12/13/2019   Healthcare maintenance 12/13/2019   Coronary artery disease of native artery of native heart with stable angina pectoris (HCC) 12/21/2018   Shoulder arthritis 02/03/2018   Gout 01/21/2018   Low testosterone 01/21/2018   Lightheadedness 07/10/2017   BPV (benign positional vertigo), left 07/10/2017   Cough 06/06/2017   B12 deficiency 04/02/2017   COPD with acute exacerbation (HCC) 10/07/2014   Inguinal hernia of right side without obstruction or gangrene 07/31/2012   OSA (obstructive sleep apnea) 05/01/2012   CAD (coronary artery disease)    Carotid artery disease (HCC)    COPD (chronic obstructive pulmonary disease) with chronic bronchitis (HCC)    GERD (gastroesophageal reflux disease)    Ejection fraction    MIXED HYPERLIPIDEMIA 05/03/2010   BENIGN POSITIONAL VERTIGO 05/03/2010   Anxiety state 11/10/2008   Angioedema 08/18/2007   SYSTOLIC MURMUR 05/22/2007   Dysphagia 05/22/2007   PROSTATITIS, HX OF 05/22/2007   ADVEF, DRUG/MEDICINAL/BIOLOGICAL SUBST NOS 01/26/2007   COLONIC POLYPS 12/31/2003   GASTROESOPHAGEAL REFLUX DISEASE, CHRONIC 12/31/2003   Diaphragmatic hernia 12/31/2003   Diverticulosis of colon 12/31/2003   HLD (hyperlipidemia) 05/02/2001   HTN (hypertension) 05/02/2001   Past Medical History:  Diagnosis Date   Allergy    Anxiety    Arthritis    right shoulder   CAD (coronary artery disease)    a. 2003 Cath: OM1 100%, good collats, RCA 60-70p-->med rx; b. 04/2020  MV: mild ischemia to sm area of mid inflat & apical lat segments. EF >65%; d. 10/2020 Cath: LM 85ost, LAD 30p/m, OM1 100, RCA 99p/m; e. 10/2020 CABG x 3: LIMA->LAD, VG->RPDA, VG->OM2.   Carotid artery disease (HCC)    a. 03/2020 Carotid U/S: RICA 1-39%, LICA 40-59%; b. 01/2021 U/S: 40-59% bilat ICA stenoses.   COPD (chronic obstructive pulmonary  disease) (HCC)    Significant Dr. Delton Coombes   Diastolic dysfunction    a. 03/2018 Echo: EF 60-65%, no rwma, Gr1 DD. Mildly dil LA. Nl RV fxn.   Dilation of ascending aorta and aortic root (HCC)    a. 10/2020 Echo: Asc Ao 40mm.   Diverticulosis of colon    Emphysema of lung (HCC)    GERD (gastroesophageal reflux disease)    Gout    Hyperlipidemia    Hypertension    LVH (left ventricular hypertrophy)    a. 10/2020 Echo: EF 70-75%, no rwma, mod LVH. Nl RV size/fxn. Mildly dil RA. Triv MR. Asc Ao 40mm.   Prostatitis    Sleep apnea    uses cpap nightly   Past Surgical History:  Procedure Laterality Date   COLONOSCOPY  12/2003   patterson - polyp   CORONARY ARTERY BYPASS GRAFT N/A 11/05/2020   Procedure: CORONARY ARTERY BYPASS GRAFTING (CABG) TIMES 3, USING LEFT INTERNAL MAMMARY ARTERY AND GREATER SAPHEOUS VEIN HARVESTED ENDOSCOPICALLY LEFT AND RIGHT LEGS;  Surgeon: Corliss Skains, MD;  Location: [facility] OR;  Service: Open Heart Surgery;  Laterality: N/A;   deviated septum     KNEE ARTHROSCOPY     right   LEFT HEART CATH AND CORONARY ANGIOGRAPHY N/A 11/03/2020   Procedure: LEFT HEART CATH AND CORONARY ANGIOGRAPHY;  Surgeon: Iran Ouch, MD;  Location: [facility] INVASIVE CV LAB;  Service: Cardiovascular;  Laterality: N/A;   TOTAL SHOULDER ARTHROPLASTY Right 02/03/2018   Procedure: RIGHT REVERSE SHOULDER ARTHROPLASTY;  Surgeon: Cammy Copa, MD;  Location: [facility] OR;  Service: Orthopedics;  Laterality: Right;   WISDOM TOOTH EXTRACTION     Family History  Problem Relation Age of Onset   Angina Mother        car accident   Asthma Father    Crohn's disease Sister    Colon cancer Neg Hx    Esophageal cancer Neg Hx    Rectal cancer Neg Hx    Stomach cancer Neg Hx     Current Outpatient Medications:    albuterol (VENTOLIN HFA) 108 (90 Base) MCG/ACT inhaler, TAKE 2 PUFFS BY MOUTH EVERY 6 HOURS AS NEEDED FOR WHEEZE OR SHORTNESS OF BREATH, Disp: 6.7 each, Rfl: 0   aspirin EC 325 MG EC tablet,  Take 1 tablet (325 mg total) by mouth daily., Disp: 30 tablet, Rfl: 0   atorvastatin (LIPITOR) 80 MG tablet, TAKE 1 TABLET BY MOUTH EVERY DAY, Disp: 90 tablet, Rfl: 3   azithromycin (ZITHROMAX) 250 MG tablet, Take 2 tablets on day 1, then 1 tablet daily on days 2 through 5, Disp: 6 tablet, Rfl: 0   dextromethorphan-guaiFENesin (MUCINEX DM) 30-600 MG 12hr tablet, Take 1 tablet by mouth 2 (two) times daily., Disp: , Rfl:    fluticasone (FLONASE) 50 MCG/ACT nasal spray, Place 2 sprays into both nostrils daily., Disp: 16 g, Rfl: 6   Fluticasone-Umeclidin-Vilant (TRELEGY ELLIPTA) 100-62.5-25 MCG/ACT AEPB, Inhale 1 puff into the lungs daily., Disp: 180 each, Rfl: 3   Multiple Vitamin (MULTIVITAMIN) capsule, Take 1 capsule by mouth daily., Disp: , Rfl:    sertraline (ZOLOFT) 50  MG tablet, TAKE 1 TABLET BY MOUTH EVERY DAY, Disp: 90 tablet, Rfl: 1 Allergies  Allergen Reactions   Avelox [Moxifloxacin Hcl In Nacl] Other (See Comments)    Facial swelling   Sulfamethoxazole-Trimethoprim Other (See Comments)    REACTION: sore mouth   Imdur [Isosorbide Nitrate] Other (See Comments)    Made pt feel groggy, loopy, bad dreams      ROS: A complete ROS was performed with pertinent positives/negatives noted in the HPI. The remainder of the ROS are negative.    Objective:   Today's Vitals   07/01/23 1351  BP: 130/70  Pulse: 68  Temp: 98.7 F (37.1 C)  TempSrc: Temporal  SpO2: 95%  Weight: 206 lb 3.2 oz (93.5 kg)  Height: 5\' 11"  (1.803 m)    GENERAL: Well-appearing, in NAD. Well nourished.  SKIN: Pink, warm and dry. No rash.  HEENT:    HEAD: Normocephalic, non-traumatic.  EYES: Conjunctive pink without exudate. PERRL.  EARS: External ear w/o redness, swelling, masses, or lesions. EAC clear. TM's intact, translucent w/o bulging, appropriate landmarks visualized.  NOSE: Septum midline w/o deformity. Nares patent, mucosa pink and non-inflamed w/o drainage. No sinus tenderness.  THROAT: Uvula midline.  Oropharynx clear. Tonsils non-inflamed w/o exudate. Mucus membranes pink and moist.  NECK: Trachea midline. Full ROM w/o pain or tenderness. No lymphadenopathy.  RESPIRATORY: Chest wall symmetrical. Respirations even and non-labored. Breath sounds clear to auscultation bilaterally. (+) cough CARDIAC: S1, S2 present, regular rate and rhythm. Peripheral pulses 2+ bilaterally.  EXTREMITIES: Without clubbing, cyanosis, or edema.  NEUROLOGIC: Steady, even gait.  PSYCH/MENTAL STATUS: Alert, oriented x 3. Cooperative, appropriate mood and affect.    Results for orders placed or performed in visit on 07/01/23  POC COVID-19  Result Value Ref Range   SARS Coronavirus 2 Ag Negative Negative  POC Influenza A&B (Binax test)  Result Value Ref Range   Influenza A, POC Negative Negative   Influenza B, POC Negative Negative      Assessment & Plan:  Assessment and Plan  Assessment and Plan    Acute Cough with COPD Exacerbation COVID-19 and influenza tests negative, suggesting viral etiology or COPD exacerbation. Decision to treat with azithromycin to cover potential bacterial component in COPD exacerbation. Continued use of Mucinex DM, albuterol, and Trelegy inhalers recommended.  - Prescribe azithromycin for COPD exacerbation. - Continue Mucinex DM for cough and congestion. - Continue albuterol inhaler as needed. - Continue Trelegy inhaler as prescribed. - Advise follow-up if symptoms worsen or fail to improve.      Meds ordered this encounter  Medications   azithromycin (ZITHROMAX) 250 MG tablet    Sig: Take 2 tablets on day 1, then 1 tablet daily on days 2 through 5    Dispense:  6 tablet    Refill:  0    Supervising Provider:   Garnette Gunner [[id number]]   Orders Placed This Encounter  Procedures   POC COVID-19    Previously tested for COVID-19:   No    Resident in a congregate (group) care setting:   No    Employed in healthcare setting:   No   POC Influenza A&B (Binax test)    Lab Orders         POC COVID-19         POC Influenza A&B (Binax test)     No images are attached to the encounter or orders placed in the encounter.  Return if symptoms worsen or fail to improve.  Salvatore Decent, FNP

## 2023-07-01 NOTE — Patient Instructions (Signed)
 Continue albuterol and Trelegy inhaler  Use Mucinex DM  Can use tylenol if needed for fever  Continue flonase nasal spray

## 2023-07-28 ENCOUNTER — Encounter: Payer: Self-pay | Admitting: Orthopedic Surgery

## 2023-07-28 ENCOUNTER — Other Ambulatory Visit: Payer: Self-pay | Admitting: Orthopedic Surgery

## 2023-07-28 DIAGNOSIS — M5412 Radiculopathy, cervical region: Secondary | ICD-10-CM

## 2023-07-28 DIAGNOSIS — M542 Cervicalgia: Secondary | ICD-10-CM

## 2023-07-29 ENCOUNTER — Inpatient Hospital Stay: Admission: RE | Admit: 2023-07-29 | Source: Ambulatory Visit

## 2023-07-29 ENCOUNTER — Telehealth: Payer: Self-pay | Admitting: Student

## 2023-07-29 ENCOUNTER — Ambulatory Visit
Admission: RE | Admit: 2023-07-29 | Discharge: 2023-07-29 | Disposition: A | Discharge status: Home / Self Care | Source: Ambulatory Visit | Attending: Orthopedic Surgery | Admitting: Orthopedic Surgery

## 2023-07-29 DIAGNOSIS — M5412 Radiculopathy, cervical region: Secondary | ICD-10-CM

## 2023-07-29 DIAGNOSIS — M542 Cervicalgia: Secondary | ICD-10-CM

## 2023-07-29 NOTE — Telephone Encounter (Signed)
 Patient called. He was unable to do the MRI. He needs something to calm him before the MRI. He has not [facility] the MRI yet.

## 2023-07-30 ENCOUNTER — Other Ambulatory Visit: Payer: Self-pay | Admitting: Surgical

## 2023-07-30 MED ORDER — DIAZEPAM 5 MG PO TABS: 5.0000 mg | ORAL_TABLET | Freq: Once | ORAL | 0 refills | Status: AC

## 2023-07-30 NOTE — Telephone Encounter (Signed)
 Sent in prescription for Valium 

## 2023-07-30 NOTE — Telephone Encounter (Signed)
 Called and advised pt.

## 2023-07-31 ENCOUNTER — Encounter: Payer: Self-pay | Admitting: Orthopedic Surgery

## 2023-07-31 ENCOUNTER — Other Ambulatory Visit: Payer: Self-pay | Admitting: Orthopedic Surgery

## 2023-08-01 ENCOUNTER — Encounter: Admitting: Family Medicine

## 2023-08-02 ENCOUNTER — Ambulatory Visit
Admission: RE | Admit: 2023-08-02 | Discharge: 2023-08-02 | Disposition: A | Discharge status: Home / Self Care | Source: Ambulatory Visit | Attending: Orthopedic Surgery | Admitting: Orthopedic Surgery

## 2023-08-02 DIAGNOSIS — M542 Cervicalgia: Secondary | ICD-10-CM

## 2023-08-02 DIAGNOSIS — M5412 Radiculopathy, cervical region: Secondary | ICD-10-CM

## 2023-08-20 DIAGNOSIS — G4733 Obstructive sleep apnea (adult) (pediatric): Secondary | ICD-10-CM | POA: Diagnosis not present

## 2023-08-22 ENCOUNTER — Encounter: Payer: Self-pay | Admitting: Family Medicine

## 2023-08-22 ENCOUNTER — Ambulatory Visit (INDEPENDENT_AMBULATORY_CARE_PROVIDER_SITE_OTHER): Admitting: Family Medicine

## 2023-08-22 VITALS — BP 134/82 | HR 78 | Temp 97.6°F | Ht 71.0 in | Wt 208.8 lb

## 2023-08-22 DIAGNOSIS — Z131 Encounter for screening for diabetes mellitus: Secondary | ICD-10-CM

## 2023-08-22 DIAGNOSIS — Z0000 Encounter for general adult medical examination without abnormal findings: Secondary | ICD-10-CM | POA: Diagnosis not present

## 2023-08-22 DIAGNOSIS — F341 Dysthymic disorder: Secondary | ICD-10-CM

## 2023-08-22 DIAGNOSIS — Z125 Encounter for screening for malignant neoplasm of prostate: Secondary | ICD-10-CM | POA: Diagnosis not present

## 2023-08-22 DIAGNOSIS — Z23 Encounter for immunization: Secondary | ICD-10-CM

## 2023-08-22 DIAGNOSIS — I25118 Atherosclerotic heart disease of native coronary artery with other forms of angina pectoris: Secondary | ICD-10-CM

## 2023-08-22 DIAGNOSIS — J4489 Other specified chronic obstructive pulmonary disease: Secondary | ICD-10-CM

## 2023-08-22 LAB — CBC WITH DIFFERENTIAL/PLATELET
Basophils Absolute: 0 10*3/uL (ref 0.0–0.1)
Basophils Relative: 0.6 % (ref 0.0–3.0)
Eosinophils Absolute: 0.3 10*3/uL (ref 0.0–0.7)
Eosinophils Relative: 3.8 % (ref 0.0–5.0)
HCT: 48.4 % (ref 39.0–52.0)
Hemoglobin: 16.5 g/dL (ref 13.0–17.0)
Lymphocytes Relative: 26.6 % (ref 12.0–46.0)
Lymphs Abs: 2.2 10*3/uL (ref 0.7–4.0)
MCHC: 34.1 g/dL (ref 30.0–36.0)
MCV: 96 fl (ref 78.0–100.0)
Monocytes Absolute: 0.9 10*3/uL (ref 0.1–1.0)
Monocytes Relative: 10.8 % (ref 3.0–12.0)
Neutro Abs: 4.8 10*3/uL (ref 1.4–7.7)
Neutrophils Relative %: 58.2 % (ref 43.0–77.0)
Platelets: 163 10*3/uL (ref 150.0–400.0)
RBC: 5.05 Mil/uL (ref 4.22–5.81)
RDW: 13.4 % (ref 11.5–15.5)
WBC: 8.2 10*3/uL (ref 4.0–10.5)

## 2023-08-22 LAB — COMPREHENSIVE METABOLIC PANEL WITH GFR
ALT: 19 U/L (ref 0–53)
AST: 19 U/L (ref 0–37)
Albumin: 4.5 g/dL (ref 3.5–5.2)
Alkaline Phosphatase: 67 U/L (ref 39–117)
BUN: 14 mg/dL (ref 6–23)
CO2: 29 meq/L (ref 19–32)
Calcium: 9.7 mg/dL (ref 8.4–10.5)
Chloride: 103 meq/L (ref 96–112)
Creatinine, Ser: 0.93 mg/dL (ref 0.40–1.50)
GFR: 84.84 mL/min (ref >60.00)
Glucose, Bld: 78 mg/dL (ref 70–99)
Potassium: 4 meq/L (ref 3.5–5.1)
Sodium: 141 meq/L (ref 135–145)
Total Bilirubin: 1.1 mg/dL (ref 0.2–1.2)
Total Protein: 6.9 g/dL (ref 6.0–8.3)

## 2023-08-22 LAB — LIPID PANEL
Cholesterol: 136 mg/dL (ref 0–200)
HDL: 53 mg/dL (ref >39.00)
LDL Cholesterol: 58 mg/dL (ref 0–99)
NonHDL: 82.73
Total CHOL/HDL Ratio: 3
Triglycerides: 122 mg/dL (ref 0.0–149.0)
VLDL: 24.4 mg/dL (ref 0.0–40.0)

## 2023-08-22 LAB — URINALYSIS, ROUTINE W REFLEX MICROSCOPIC
Bilirubin Urine: NEGATIVE
Hgb urine dipstick: NEGATIVE
Ketones, ur: NEGATIVE
Leukocytes,Ua: NEGATIVE
Nitrite: NEGATIVE
RBC / HPF: NONE SEEN (ref 0–?)
Specific Gravity, Urine: 1.02 (ref 1.000–1.030)
Total Protein, Urine: NEGATIVE
Urine Glucose: NEGATIVE
Urobilinogen, UA: 0.2 (ref 0.0–1.0)
WBC, UA: NONE SEEN (ref 0–?)
pH: 6 (ref 5.0–8.0)

## 2023-08-22 LAB — HEMOGLOBIN A1C: Hgb A1c MFr Bld: 6 % (ref 4.6–6.5)

## 2023-08-22 LAB — PSA: PSA: 1.94 ng/mL (ref 0.10–4.00)

## 2023-08-22 MED ORDER — ALBUTEROL SULFATE HFA 108 (90 BASE) MCG/ACT IN AERS: INHALATION_SPRAY | RESPIRATORY_TRACT | 0 refills | Status: DC

## 2023-08-22 MED ORDER — SERTRALINE HCL 50 MG PO TABS: 50.0000 mg | ORAL_TABLET | Freq: Every day | ORAL | 1 refills | Status: AC

## 2023-08-22 NOTE — Progress Notes (Signed)
 Established Patient Office Visit   Subjective:  Patient ID: Hayden Deleon, male    DOB: [birth year]  Age: 68 y.o. MRN: [id number]  Chief Complaint  Patient presents with   Annual Exam    CPE pt is fasting. Tdap today Decline shingrix     HPI Encounter Diagnoses  Name Primary?   Healthcare maintenance Yes   Dysthymia    Coronary artery disease of native artery of native heart with stable angina pectoris (HCC)    Screening for diabetes mellitus    Screening for prostate cancer    Immunization due    COPD (chronic obstructive pulmonary disease) with chronic bronchitis (HCC)    Here for physical and follow-up of above.  Continues to work and is staying active physically.  Occasionally experiences some lightheadedness when standing.  Has difficulty staying hydrated.  He sometimes feels a momentary spinning sensation when he lies down or turns over in bed.   Review of Systems  Constitutional: Negative.   HENT: Negative.    Eyes:  Negative for blurred vision, discharge and redness.  Respiratory: Negative.    Cardiovascular: Negative.   Gastrointestinal:  Negative for abdominal pain.  Genitourinary: Negative.   Musculoskeletal: Negative.  Negative for myalgias.  Skin:  Negative for rash.  Neurological:  Negative for tingling, loss of consciousness and weakness.  Endo/Heme/Allergies:  Negative for polydipsia.      08/22/2023    9:44 AM 07/01/2023    1:51 PM 03/17/2023    8:04 AM  Depression screen PHQ 2/9  Decreased Interest 0 0 0  Down, Depressed, Hopeless 0 0 0  PHQ - 2 Score 0 0 0  Altered sleeping 0    Tired, decreased energy 1    Change in appetite 0    Feeling bad or failure about yourself  0    Trouble concentrating 0    Moving slowly or fidgety/restless 0    Suicidal thoughts 0    PHQ-9 Score 1    Difficult doing work/chores Not difficult at all         Current Outpatient Medications:    aspirin  EC 325 MG EC tablet, Take 1 tablet (325 mg total) by mouth  daily., Disp: 30 tablet, Rfl: 0   atorvastatin  (LIPITOR ) 80 MG tablet, TAKE 1 TABLET BY MOUTH EVERY DAY, Disp: 90 tablet, Rfl: 3   dextromethorphan-guaiFENesin  (MUCINEX  DM) 30-600 MG 12hr tablet, Take 1 tablet by mouth 2 (two) times daily., Disp: , Rfl:    fluticasone  (FLONASE ) 50 MCG/ACT nasal spray, Place 2 sprays into both nostrils daily., Disp: 16 g, Rfl: 6   Fluticasone -Umeclidin-Vilant (TRELEGY ELLIPTA ) 100-62.5-25 MCG/ACT AEPB, Inhale 1 puff into the lungs daily., Disp: 180 each, Rfl: 3   Multiple Vitamin (MULTIVITAMIN) capsule, Take 1 capsule by mouth daily., Disp: , Rfl:    albuterol  (VENTOLIN  HFA) 108 (90 Base) MCG/ACT inhaler, TAKE 2 PUFFS BY MOUTH EVERY 6 HOURS AS NEEDED FOR WHEEZE OR SHORTNESS OF BREATH, Disp: 6.7 each, Rfl: 0   sertraline  (ZOLOFT ) 50 MG tablet, Take 1 tablet (50 mg total) by mouth daily., Disp: 90 tablet, Rfl: 1   Objective:     BP 134/82 (Cuff Size: Normal)   Pulse 78   Temp 97.6 F (36.4 C) (Temporal)   Ht 5\' 11"  (1.803 m)   Wt 208 lb 12.8 oz (94.7 kg)   SpO2 94%   BMI 29.12 kg/m    Physical Exam Constitutional:      General: He is not in acute distress.  Appearance: Normal appearance. He is not ill-appearing, toxic-appearing or diaphoretic.  HENT:     Head: Normocephalic and atraumatic.     Right Ear: Tympanic membrane, ear canal and external ear normal.     Left Ear: Tympanic membrane, ear canal and external ear normal.     Mouth/Throat:     Mouth: Mucous membranes are moist.     Pharynx: Oropharynx is clear. No oropharyngeal exudate or posterior oropharyngeal erythema.  Eyes:     General: No scleral icterus.       Right eye: No discharge.        Left eye: No discharge.     Extraocular Movements: Extraocular movements intact.     Conjunctiva/sclera: Conjunctivae normal.     Pupils: Pupils are equal, round, and reactive to light.  Cardiovascular:     Rate and Rhythm: Normal rate and regular rhythm.  Pulmonary:     Effort: Pulmonary  effort is normal. No respiratory distress.     Breath sounds: Normal breath sounds. No wheezing or rales.  Abdominal:     General: Bowel sounds are normal.     Tenderness: There is no abdominal tenderness. There is no guarding or rebound.  Musculoskeletal:     Cervical back: No rigidity or tenderness.  Skin:    General: Skin is warm and dry.  Neurological:     Mental Status: He is alert and oriented to person, place, and time.  Psychiatric:        Mood and Affect: Mood normal.        Behavior: Behavior normal.      No results found for any visits on 08/22/23.    The ASCVD Risk score (Arnett DK, et al., 2019) failed to calculate for the following reasons:   The valid total cholesterol range is 130 to 320 mg/dL    Assessment & Plan:   Healthcare maintenance -     CBC with Differential/Platelet -     Urinalysis, Routine w reflex microscopic  Dysthymia -     Sertraline  HCl; Take 1 tablet (50 mg total) by mouth daily.  Dispense: 90 tablet; Refill: 1  Coronary artery disease of native artery of native heart with stable angina pectoris (HCC) -     Comprehensive metabolic panel with GFR -     Lipid panel  Screening for diabetes mellitus -     Comprehensive metabolic panel with GFR -     Hemoglobin A1c  Screening for prostate cancer -     PSA  Immunization due -     Tdap vaccine greater than or equal to 7yo IM  COPD (chronic obstructive pulmonary disease) with chronic bronchitis (HCC) -     Albuterol  Sulfate HFA; TAKE 2 PUFFS BY MOUTH EVERY 6 HOURS AS NEEDED FOR WHEEZE OR SHORTNESS OF BREATH  Dispense: 6.7 each; Refill: 0    Return in about 1 year (around 08/21/2024), or if symptoms worsen or fail to improve.  Continue physical activity as tolerated.  Please have the second dose of your Shingrix  vaccine.  Recommended COVID-vaccine.  Information given on health maintenance and disease prevention.  Tonna Frederic, MD

## 2023-08-25 ENCOUNTER — Ambulatory Visit: Payer: Self-pay | Admitting: Family Medicine

## 2023-09-11 ENCOUNTER — Ambulatory Visit: Payer: Self-pay | Admitting: Orthopedic Surgery

## 2023-09-11 ENCOUNTER — Encounter: Payer: Self-pay | Admitting: Internal Medicine

## 2023-09-11 NOTE — Progress Notes (Signed)
 Hi Lauren.  I left a message with Hayden Deleon about his fairly severe spinal stenosis.  This looks operative.  Please have him follow-up with Dr. Sulema Endo sooner rather than later.  Thanks

## 2023-09-12 ENCOUNTER — Other Ambulatory Visit (HOSPITAL_BASED_OUTPATIENT_CLINIC_OR_DEPARTMENT_OTHER): Payer: Self-pay

## 2023-09-12 DIAGNOSIS — M542 Cervicalgia: Secondary | ICD-10-CM

## 2023-09-12 NOTE — Progress Notes (Signed)
 Appt made 09/22/23 @ 415

## 2023-09-15 ENCOUNTER — Encounter: Payer: Self-pay | Admitting: Cardiovascular Disease

## 2023-09-17 ENCOUNTER — Encounter: Payer: Self-pay | Admitting: Internal Medicine

## 2023-09-17 ENCOUNTER — Ambulatory Visit (INDEPENDENT_AMBULATORY_CARE_PROVIDER_SITE_OTHER): Admitting: Internal Medicine

## 2023-09-17 VITALS — BP 120/80 | HR 65 | Temp 98.5°F | Ht 70.0 in | Wt 209.8 lb

## 2023-09-17 DIAGNOSIS — J449 Chronic obstructive pulmonary disease, unspecified: Secondary | ICD-10-CM | POA: Diagnosis not present

## 2023-09-17 DIAGNOSIS — G4733 Obstructive sleep apnea (adult) (pediatric): Secondary | ICD-10-CM | POA: Diagnosis not present

## 2023-09-17 MED ORDER — TRELEGY ELLIPTA 100-62.5-25 MCG/ACT IN AEPB: 1.0000 | INHALATION_SPRAY | Freq: Every day | RESPIRATORY_TRACT | Status: DC

## 2023-09-17 NOTE — Progress Notes (Signed)
 [facility] Pulmonary Medicine Consultation    Date: 09/17/2023  MRN# [id number] Hayden Deleon 24-Mar-1956  SYNOPSIS Established care for COPD and OSA **Pneumovax administered 12/30/16 **PFT 12/24/16; Spirometry: FVC was 62% of predicted, FEV1 was 33% of predicted, ratio is 43%. Flow volume loop is consistent with obstruction. There is significant improvement with bronchodilator therapy. Lung volumes: TLC is 78% of predicted, RV to TLC ratio was normal. DLCO is 49% of predicted. -Overall this test is consistent with severe COPD with reversibility, there may also be an element of mild restrictive lung disease.  **CBC 08/22/16; Eos=600 during an episode of AECOPD. At baseline he is at 300.   **Alpha-1 09/30/16>> normal.  **CT chest 09/06/16 shows severe diffuse panlobular emphysema, with some left basilar scarring, left pleural thickening, small effusion with leftward mediastinal shift.. **Sleep study 05/30/12>>AHI 35  CC Follow up Assessment of OSA Follow up assessment of COPD   HPI:   RE COPD Follow-up assessment of COPD Shortness of breath and dyspnea on exertion is stable Currently on Trelegy inhaler Gold stage D with previous COPD exacerbations Previous history of RSV pneumonia  No exacerbation at this time No evidence of heart failure at this time No evidence or signs of infection at this time No respiratory distress No fevers, chills, nausea, vomiting, diarrhea No evidence of lower extremity edema No evidence hemoptysis   Regarding OSA Discussed sleep data and reviewed with patient.  Encouraged proper weight management.  Discussed driving precautions and its relationship with hypersomnolence.  Discussed sleep hygiene, and benefits of a fixed sleep waked time.  The importance of getting eight or more hours of sleep discussed with patient.  Discussed limiting the use of the computer and television before bedtime.  Decrease naps during the day, so night time sleep will  become enhanced.  Limit caffeine, and sleep deprivation.   Patient uses and benefits from therapy Using CPAP nightly and with naps Pressure setting is comfortable and is sleeping well. excellent compliance with well-controlled sleep apnea AHI reduced to 2.2 ACPAP 7-20 Excellent compliance Uses and benefits from CPAP therapy Previous AHI is 35   s/p CABG  Doing well post op   BP 120/80 (BP Location: Right Arm, Patient Position: Sitting, Cuff Size: Normal)   Pulse 65   Temp 98.5 F (36.9 C) (Oral)   Ht 5' 10 (1.778 m)   Wt 209 lb 12.8 oz (95.2 kg)   SpO2 91%   BMI 30.10 kg/m       Review of Systems: Gen:  Denies  fever, sweats, chills weight loss  HEENT: Denies blurred vision, double vision, ear pain, eye pain, hearing loss, nose bleeds, sore throat Cardiac:  No dizziness, chest pain or heaviness, chest tightness,edema, No JVD Resp:   No cough, -sputum production, -shortness of breath,-wheezing, -hemoptysis,  Other:  All other systems negative   Physical Examination:   General Appearance: No distress  EYES PERRLA, EOM intact.   NECK Supple, No JVD Pulmonary: normal breath sounds, No wheezing.  CardiovascularNormal S1,S2.  No m/r/g.   Abdomen: Benign, Soft, non-tender. Neurology UE/LE 5/5 strength, no focal deficits Ext pulses intact, cap refill intact ALL OTHER ROS ARE NEGATIVE        Allergies:  Avelox  [moxifloxacin  hcl in nacl], Sulfamethoxazole-trimethoprim, and Imdur  [isosorbide  nitrate]   Assessment and Plan:  68 year old pleasant white male seen today for follow-up assessment for severe COPD based on pulmonary function testing with FEV1 31% predicted with underlying severe sleep apnea in setting of deconditioned state  COPD stable  Severe based on pulmonary function testing  Gold stage D FEV1 33% Alpha-1 levels normal  Continue inhaler as prescribed Continue Trelegy as prescribed Rinse mouth after use Avoid Allergens and Irritants Avoid  secondhand smoke Avoid SICK contacts Recommend  Masking  when appropriate Recommend Keep up-to-date with vaccinations  Assessment of OSA Underlying severe OSA with AHI of 35 Patient uses CPAP and his AHI is significant reduced I have encouraged patient to increase his usage nightly Will need close follow-up assessment in 3 months   S/p CABG Follow up cardiothoracic surgery  Obesity -recommend significant weight loss -recommend changing diet  Deconditioned state -Recommend increased daily activity and exercise   MEDICATION ADJUSTMENTS/LABS AND TESTS ORDERED: Continue Trelegy Ventolin  HFA as needed Continue weight loss Continue CPAP therapy   CURRENT MEDICATIONS REVIEWED AT LENGTH WITH PATIENT TODAY   Patient  satisfied with Plan of action and management. All questions answered   Follow up 3 months   I spent a total of 41 minutes reviewing chart data, face-to-face evaluation with the patient, counseling and coordination of care as detailed above.      Lady Pier, M.D.  [facility]  Medical Director [facility] Medical Director [facility] Cardio-Pulmonary Department

## 2023-09-17 NOTE — Patient Instructions (Signed)
 Excellent Job A+ GOLD STAR!!  Continue CPAP as prescribed  Patient Instructions Continue to use CPAP every night, minimum of 4-6 hours a night.  Change equipment every 30 days or as directed by DME.  Wash your tubing with warm soap and water daily, hang to dry. Wash humidifier portion weekly. Use bottled, distilled water and change daily   Be aware of reduced alertness and do not drive or operate heavy machinery if experiencing this or drowsiness.  Exercise encouraged, as tolerated. Encouraged proper weight management.  Important to get eight or more hours of sleep  Limiting the use of the computer and television before bedtime.  Decrease naps during the day, so night time sleep will become enhanced.  Limit caffeine, and sleep deprivation.    Avoid Allergens and Irritants Avoid secondhand smoke Avoid SICK contacts Recommend  Masking  when appropriate Recommend Keep up-to-date with vaccinations  Continue Trelegy as prescribed Please rinse mouth after every use  Recommend Weight Loss

## 2023-09-20 DIAGNOSIS — G4733 Obstructive sleep apnea (adult) (pediatric): Secondary | ICD-10-CM | POA: Diagnosis not present

## 2023-09-22 ENCOUNTER — Other Ambulatory Visit (INDEPENDENT_AMBULATORY_CARE_PROVIDER_SITE_OTHER): Payer: Self-pay

## 2023-09-22 ENCOUNTER — Ambulatory Visit (INDEPENDENT_AMBULATORY_CARE_PROVIDER_SITE_OTHER): Admitting: Orthopedic Surgery

## 2023-09-22 VITALS — BP 149/77 | Ht 70.0 in | Wt 210.0 lb

## 2023-09-22 DIAGNOSIS — M542 Cervicalgia: Secondary | ICD-10-CM | POA: Diagnosis not present

## 2023-09-22 NOTE — Progress Notes (Signed)
 Orthopedic Spine Surgery Office Note  Assessment: Patient is a 68 y.o. male with neck discomfort.  Possible etiologies include his positive cervical sagittal balance or his significant facet arthropathy on the right at C4/5   Plan: -Explained that initially conservative treatment is tried as a significant number of patients may experience relief with these treatment modalities. Discussed that the conservative treatments include:  -activity modification  -physical therapy  -over the counter pain medications  -medrol dosepak  -steroid injections -Patient has tried Tylenol -Advil -Recommended he continue with the Tylenol -Advil since that has been helping.  He could also use lidocaine  patches or gel over the affected area.  Talked about heating the affected area to warm it up and then doing some home exercises on a daily basis -Neck step would be a diagnostic/therapeutic injection for that C4/5 facet arthropathy -Did talk about his central stenosis.  Since he is having no symptoms and has no physical exam findings consistent with myelopathy, recommend no intervention at this time.  Explained to him the symptoms of myelopathy and told him to return to the office should he develop any of those -Patient should return to office on an as-needed basis   Patient expressed understanding of the plan and all questions were answered to the patient's satisfaction.   ___________________________________________________________________________   History:  Patient is a 68 y.o. male who presents today for cervical spine.  Patient has had discomfort in his neck for a while now.  There is no trauma or injury that preceded the onset of this discomfort.  He said is not really painful he just describes it as discomfort.  He said he feels in the back of his neck particular on the right side.  He notices worse at the end of the day.  He has no pain radiating into either upper extremity.   Weakness:  denies Difficulty with fine motor skills (e.g., buttoning shirts, handwriting): denies Symptoms of imbalance: denies Paresthesias and numbness: denies Bowel or bladder incontinence: denies Saddle anesthesia: denies  Treatments tried: tylenol  with advil  Review of systems: Denies fevers and chills, night sweats, unexplained weight loss, history of cancer, pain that wakes them at night  Past medical history: CAD HLD HTN COPD OSA GERD  Allergies: avelox , bactrim, imdur   Past surgical history:  CABG Right shoulder replacement  Right knee arthroscopy  Social history: Denies use of nicotine product (smoking, vaping, patches, smokeless) Alcohol use: denies Denies recreational drug use   Physical Exam:  BMI of 30.1  General: no acute distress, appears stated age Neurologic: alert, answering questions appropriately, following commands Respiratory: unlabored breathing on room air, symmetric chest rise Psychiatric: appropriate affect, normal cadence to speech   MSK (spine):  -Strength exam      Left  Right Grip strength                5/5  5/5 Interosseus   5/5   5/5 Wrist extension  5/5  5/5 Wrist flexion   5/5  5/5 Elbow flexion   5/5  5/5 Deltoid    5/5  5/5  -Sensory exam   Sensation intact to light touch in C5-T1 nerve distributions of bilateral upper extremities  -Brachioradialis DTR: 2/4 on the left, 2/4 on the right -Biceps DTR: 2/4 on the left, 2/4 on the right  -Spurling: negative bilaterally -Hoffman sign: negative bilaterally  -Clonus: no beats bilaterally -Interosseous wasting: none seen -Grip and release test: negative  -Romberg: negative  -Gait: normal  Imaging: XRs of the cervical  spine from 09/22/2023 were independently reviewed and interpreted, showing disc height loss with possible autofusion at C3/4. Disc height loss and significant anterior osteophyte formation seen at C5/6. Lower cervical levels not well visualized. No fracture or  dislocation seen. No evidence of instability on flexion/extension views.   MRI of the cervical spine from 08/02/2023 was independently reviewed and interpreted, showing central stenosis at C3/4 and C5/6. Bilateral foraminal stenosis at C3/4. Right sided foraminal stenosis at C4/5 and significant facet arthropathy on that side as well. Bilateral foraminal stenosis at C5/6. Left sided foraminal stenosis at C6/7. No T2 cord signal change seen. Appears to be autofused at C3/4.    Patient name: Hayden Deleon Patient MRN: [id number] Date of visit: 09/22/23

## 2023-10-20 DIAGNOSIS — G4733 Obstructive sleep apnea (adult) (pediatric): Secondary | ICD-10-CM | POA: Diagnosis not present

## 2023-11-06 ENCOUNTER — Ambulatory Visit (INDEPENDENT_AMBULATORY_CARE_PROVIDER_SITE_OTHER): Admitting: Family Medicine

## 2023-11-06 ENCOUNTER — Encounter: Payer: Self-pay | Admitting: Family Medicine

## 2023-11-06 VITALS — BP 146/80 | HR 59 | Temp 97.6°F | Ht 70.0 in | Wt 207.0 lb

## 2023-11-06 DIAGNOSIS — J441 Chronic obstructive pulmonary disease with (acute) exacerbation: Secondary | ICD-10-CM | POA: Diagnosis not present

## 2023-11-06 DIAGNOSIS — R030 Elevated blood-pressure reading, without diagnosis of hypertension: Secondary | ICD-10-CM | POA: Insufficient documentation

## 2023-11-06 MED ORDER — PREDNISONE 20 MG PO TABS
20.0000 mg | ORAL_TABLET | Freq: Two times a day (BID) | ORAL | 0 refills | Status: AC
Start: 2023-11-06 — End: 2023-11-13

## 2023-11-06 NOTE — Progress Notes (Signed)
 Established Patient Office Visit   Subjective:  Patient ID: Hayden Deleon, male    DOB: [birth year]  Age: 68 y.o. MRN: [id number]  Chief Complaint  Patient presents with   COPD    Increase in difficulty breathing, wheezing. Pt also complains of some chest congestion. Pt states Albuterol  does not work like it use to. X 2 week. Feel unbalance.     COPD He complains of shortness of breath. There is no cough, sputum production or wheezing. Pertinent negatives include no chest pain, fever or myalgias. His past medical history is significant for COPD.   Encounter Diagnoses  Name Primary?   COPD with acute exacerbation (HCC) Yes   Elevated BP without diagnosis of hypertension    2-week history of dyspnea with exertion.  He denies fevers chills, cough or sputum production.  Actually feels okay other than the dyspnea.  This was preceded by attending a convention in [place] .  There was a lot of walking.  Has noted increased albuterol  use with less effect.  At his last visit with pulmonology he was offered the higher dose of Trelegy but declined.   Review of Systems  Constitutional: Negative.  Negative for chills, diaphoresis and fever.  HENT: Negative.    Eyes:  Negative for blurred vision, discharge and redness.  Respiratory:  Positive for shortness of breath. Negative for cough, sputum production and wheezing.   Cardiovascular: Negative.  Negative for chest pain.  Gastrointestinal:  Negative for abdominal pain, nausea and vomiting.  Genitourinary: Negative.   Musculoskeletal: Negative.  Negative for myalgias.  Skin:  Negative for rash.  Neurological:  Negative for tingling, loss of consciousness and weakness.  Endo/Heme/Allergies:  Negative for polydipsia.     Current Outpatient Medications:    albuterol  (VENTOLIN  HFA) 108 (90 Base) MCG/ACT inhaler, TAKE 2 PUFFS BY MOUTH EVERY 6 HOURS AS NEEDED FOR WHEEZE OR SHORTNESS OF BREATH, Disp: 6.7 each, Rfl: 0   aspirin  EC 325 MG EC tablet,  Take 1 tablet (325 mg total) by mouth daily., Disp: 30 tablet, Rfl: 0   atorvastatin  (LIPITOR ) 80 MG tablet, TAKE 1 TABLET BY MOUTH EVERY DAY, Disp: 90 tablet, Rfl: 3   Fluticasone -Umeclidin-Vilant (TRELEGY ELLIPTA ) 100-62.5-25 MCG/ACT AEPB, Inhale 1 puff into the lungs daily., Disp: 180 each, Rfl: 3   Fluticasone -Umeclidin-Vilant (TRELEGY ELLIPTA ) 100-62.5-25 MCG/ACT AEPB, Inhale 1 puff into the lungs daily., Disp: , Rfl:    Multiple Vitamin (MULTIVITAMIN) capsule, Take 1 capsule by mouth daily., Disp: , Rfl:    predniSONE  (DELTASONE ) 20 MG tablet, Take 1 tablet (20 mg total) by mouth 2 (two) times daily with a meal for 7 days., Disp: 14 tablet, Rfl: 0   sertraline  (ZOLOFT ) 50 MG tablet, Take 1 tablet (50 mg total) by mouth daily., Disp: 90 tablet, Rfl: 1   dextromethorphan-guaiFENesin  (MUCINEX  DM) 30-600 MG 12hr tablet, Take 1 tablet by mouth 2 (two) times daily., Disp: , Rfl:    fluticasone  (FLONASE ) 50 MCG/ACT nasal spray, Place 2 sprays into both nostrils daily., Disp: 16 g, Rfl: 6   Objective:     BP (!) 146/80 (BP Location: Right Arm, Patient Position: Sitting, Cuff Size: Large)   Pulse (!) 59   Temp 97.6 F (36.4 C) (Temporal)   Ht 5' 10 (1.778 m)   Wt 207 lb (93.9 kg)   SpO2 93%   BMI 29.70 kg/m  BP Readings from Last 3 Encounters:  11/06/23 (!) 146/80  09/22/23 (!) 149/77  09/17/23 120/80   Wt Readings from  Last 3 Encounters:  11/06/23 207 lb (93.9 kg)  09/22/23 210 lb (95.3 kg)  09/17/23 209 lb 12.8 oz (95.2 kg)      Physical Exam Constitutional:      General: He is not in acute distress.    Appearance: Normal appearance. He is not ill-appearing, toxic-appearing or diaphoretic.  HENT:     Head: Normocephalic and atraumatic.     Right Ear: External ear normal.     Left Ear: External ear normal.     Mouth/Throat:     Mouth: Mucous membranes are moist.     Pharynx: Oropharynx is clear. No oropharyngeal exudate or posterior oropharyngeal erythema.  Eyes:      General: No scleral icterus.       Right eye: No discharge.        Left eye: No discharge.     Extraocular Movements: Extraocular movements intact.     Conjunctiva/sclera: Conjunctivae normal.     Pupils: Pupils are equal, round, and reactive to light.  Cardiovascular:     Rate and Rhythm: Normal rate and regular rhythm.  Pulmonary:     Effort: Pulmonary effort is normal. No respiratory distress.     Breath sounds: Normal breath sounds. No wheezing or rales.  Musculoskeletal:     Cervical back: No rigidity or tenderness.     Right lower leg: No edema.     Left lower leg: No edema.  Lymphadenopathy:     Cervical: No cervical adenopathy.  Skin:    General: Skin is warm and dry.  Neurological:     Mental Status: He is alert and oriented to person, place, and time.  Psychiatric:        Mood and Affect: Mood normal.        Behavior: Behavior normal.      No results found for any visits on 11/06/23.    The 10-year ASCVD risk score (Arnett DK, et al., 2019) is: 14%    Assessment & Plan:   COPD with acute exacerbation (HCC) -     predniSONE ; Take 1 tablet (20 mg total) by mouth 2 (two) times daily with a meal for 7 days.  Dispense: 14 tablet; Refill: 0  Elevated BP without diagnosis of hypertension    Return if symptoms worsen or fail to improve.  Steroid burst for exacerbation of COPD.  Recommended follow-up with pulmonology if not improving or RTC if needed.  Has follow-up with cardiology in a few weeks.  Information was given on COPD exacerbation as well as preventing hypertension.  Elsie Sim Lent, MD

## 2023-11-10 ENCOUNTER — Other Ambulatory Visit: Payer: Self-pay | Admitting: Nurse Practitioner

## 2023-11-10 DIAGNOSIS — J4489 Other specified chronic obstructive pulmonary disease: Secondary | ICD-10-CM

## 2023-11-10 NOTE — Telephone Encounter (Signed)
 Requesting: ALBUTEROL  HFA (PROVENTIL ) INH  Last Visit: 11/06/2023 Next Visit: Visit date not found Last Refill: 08/22/2023  Please Advise

## 2023-11-14 ENCOUNTER — Encounter: Payer: Self-pay | Admitting: Family Medicine

## 2023-11-20 DIAGNOSIS — G4733 Obstructive sleep apnea (adult) (pediatric): Secondary | ICD-10-CM | POA: Diagnosis not present

## 2023-11-27 ENCOUNTER — Encounter: Payer: Self-pay | Admitting: Cardiovascular Disease

## 2023-11-27 ENCOUNTER — Ambulatory Visit: Attending: Cardiovascular Disease | Admitting: Cardiovascular Disease

## 2023-11-27 VITALS — BP 106/64 | HR 69 | Ht 71.0 in | Wt 202.4 lb

## 2023-11-27 DIAGNOSIS — M79606 Pain in leg, unspecified: Secondary | ICD-10-CM

## 2023-11-27 DIAGNOSIS — E785 Hyperlipidemia, unspecified: Secondary | ICD-10-CM

## 2023-11-27 DIAGNOSIS — I25118 Atherosclerotic heart disease of native coronary artery with other forms of angina pectoris: Secondary | ICD-10-CM

## 2023-11-27 DIAGNOSIS — R079 Chest pain, unspecified: Secondary | ICD-10-CM

## 2023-11-27 DIAGNOSIS — Z136 Encounter for screening for cardiovascular disorders: Secondary | ICD-10-CM

## 2023-11-27 DIAGNOSIS — I10 Essential (primary) hypertension: Secondary | ICD-10-CM | POA: Diagnosis not present

## 2023-11-27 DIAGNOSIS — I6523 Occlusion and stenosis of bilateral carotid arteries: Secondary | ICD-10-CM

## 2023-11-27 NOTE — Progress Notes (Signed)
 Cardiology Office Note   Date:  11/27/2023   ID:  Hayden Deleon, Hayden Deleon 1955-10-25, MRN [id number]  PCP:  Berneta Elsie Sayre, MD  Cardiologist:   Deatrice Cage, MD   Chief Complaint  Patient presents with   Follow-up    OD 12 month f/u c/o chest discomfort in the last month. Meds reviewed verbally with pt.      History of Present Illness: Hayden Deleon is a 68 y.o. male who is here today for follow-up visit regarding coronary artery disease and stable angina.  He has known history of COPD and quit smoking in 2005.  He has hyperlipidemia, carotid disease and hypertension. He had worsening angina in 2022.  Lexiscan  Myoview  was abnormal with inferior lateral and apical ischemia.  Cardiac catheterization was done in August 2022 which showed severe ostial left main stenosis and subtotal occlusion of the mid right coronary artery with occluded OM1.  Ejection fraction was normal.  The patient underwent CABG at [facility] without complications.  Echocardiogram in November 2023 showed hyperdynamic LV systolic function with no significant valvular abnormalities.  He was diagnosed recently with pneumonia and was treated appropriately.  He recovered from that.  However, over the last few weeks, he noticed substernal chest tightness with exertion that resolves with rest.  He reports bilateral leg fatigue with exertion as well but no true pain.  Past Medical History:  Diagnosis Date   Allergy    Anxiety    Arthritis    right shoulder   CAD (coronary artery disease)    a. 2003 Cath: OM1 100%, good collats, RCA 60-70p-->med rx; b. 04/2020 MV: mild ischemia to sm area of mid inflat & apical lat segments. EF >65%; d. 10/2020 Cath: LM 85ost, LAD 30p/m, OM1 100, RCA 99p/m; e. 10/2020 CABG x 3: LIMA->LAD, VG->RPDA, VG->OM2.   Carotid artery disease (HCC)    a. 03/2020 Carotid U/S: RICA 1-39%, LICA 40-59%; b. 01/2021 U/S: 40-59% bilat ICA stenoses.   COPD (chronic obstructive pulmonary  disease) (HCC)    Significant Dr. Shelah   Diastolic dysfunction    a. 03/2018 Echo: EF 60-65%, no rwma, Gr1 DD. Mildly dil LA. Nl RV fxn.   Dilation of ascending aorta and aortic root (HCC)    a. 10/2020 Echo: Asc Ao 40mm.   Diverticulosis of colon    Emphysema of lung (HCC)    GERD (gastroesophageal reflux disease)    Gout    Hyperlipidemia    Hypertension    LVH (left ventricular hypertrophy)    a. 10/2020 Echo: EF 70-75%, no rwma, mod LVH. Nl RV size/fxn. Mildly dil RA. Triv MR. Asc Ao 40mm.   Prostatitis    Sleep apnea    uses cpap nightly    Past Surgical History:  Procedure Laterality Date   COLONOSCOPY  12/2003   patterson - polyp   CORONARY ARTERY BYPASS GRAFT N/A 11/05/2020   Procedure: CORONARY ARTERY BYPASS GRAFTING (CABG) TIMES 3, USING LEFT INTERNAL MAMMARY ARTERY AND GREATER SAPHEOUS VEIN HARVESTED ENDOSCOPICALLY LEFT AND RIGHT LEGS;  Surgeon: Shyrl Linnie KIDD, MD;  Location: [facility] OR;  Service: Open Heart Surgery;  Laterality: N/A;   deviated septum     JOINT REPLACEMENT     KNEE ARTHROSCOPY     right   LEFT HEART CATH AND CORONARY ANGIOGRAPHY N/A 11/03/2020   Procedure: LEFT HEART CATH AND CORONARY ANGIOGRAPHY;  Surgeon: Cage Deatrice LABOR, MD;  Location: [facility] INVASIVE CV LAB;  Service: Cardiovascular;  Laterality: N/A;  TOTAL SHOULDER ARTHROPLASTY Right 02/03/2018   Procedure: RIGHT REVERSE SHOULDER ARTHROPLASTY;  Surgeon: Addie Cordella Hamilton, MD;  Location: [facility] OR;  Service: Orthopedics;  Laterality: Right;   WISDOM TOOTH EXTRACTION       Current Outpatient Medications  Medication Sig Dispense Refill   sertraline  (ZOLOFT ) 50 MG tablet Take 1 tablet (50 mg total) by mouth daily. (Patient taking differently: Take 50 mg by mouth every other day.) 90 tablet 1   albuterol  (VENTOLIN  HFA) 108 (90 Base) MCG/ACT inhaler TAKE 2 PUFFS BY MOUTH EVERY 6 HOURS AS NEEDED FOR WHEEZE OR SHORTNESS OF BREATH 6.7 each 2   aspirin  EC 325 MG EC tablet Take 1 tablet (325 mg total)  by mouth daily. 30 tablet 0   atorvastatin  (LIPITOR ) 80 MG tablet TAKE 1 TABLET BY MOUTH EVERY DAY 90 tablet 3   dextromethorphan-guaiFENesin  (MUCINEX  DM) 30-600 MG 12hr tablet Take 1 tablet by mouth 2 (two) times daily.     fluticasone  (FLONASE ) 50 MCG/ACT nasal spray Place 2 sprays into both nostrils daily. 16 g 6   Fluticasone -Umeclidin-Vilant (TRELEGY ELLIPTA ) 100-62.5-25 MCG/ACT AEPB Inhale 1 puff into the lungs daily. 180 each 3   Fluticasone -Umeclidin-Vilant (TRELEGY ELLIPTA ) 100-62.5-25 MCG/ACT AEPB Inhale 1 puff into the lungs daily. (Patient not taking: Reported on 11/27/2023)     Multiple Vitamin (MULTIVITAMIN) capsule Take 1 capsule by mouth daily.     No current facility-administered medications for this visit.    Allergies:   Avelox  [moxifloxacin  hcl in nacl], Sulfamethoxazole-trimethoprim, and Imdur  [isosorbide  nitrate]    Social History:  The patient  reports that he quit smoking about 24 years ago. His smoking use included cigarettes. He started smoking about 56 years ago. He has a 64 pack-year smoking history. He has quit using smokeless tobacco. He reports that he does not drink alcohol and does not use drugs.   Family History:  The patient's family history includes Angina in his mother; Asthma in his father; Crohn's disease in his sister.    ROS:  Please see the history of present illness.   Otherwise, review of systems are positive for none.   All other systems are reviewed and negative.    PHYSICAL EXAM: VS:  BP 106/64 (BP Location: Left Arm, Patient Position: Sitting, Cuff Size: Large)   Pulse 69   Ht 5' 11 (1.803 m)   Wt 202 lb 6 oz (91.8 kg)   SpO2 93%   BMI 28.23 kg/m  , BMI Body mass index is 28.23 kg/m. GEN: Well nourished, well developed, in no acute distress  HEENT: normal  Neck: no JVD, carotid bruits, or masses Cardiac: RRR; no  rubs, or gallops,no edema .  No murmurs. Respiratory:  clear to auscultation bilaterally, normal work of breathing GI:  soft, nontender, nondistended, + BS MS: no deformity or atrophy  Skin: warm and dry, no rash Neuro:  Strength and sensation are intact Psych: euthymic mood, full affect Vascular: Posterior tibialis +2 bilaterally but dorsalis pedis is not palpable.   EKG:  EKG is ordered today. The ekg ordered today demonstrates: Normal sinus rhythm Normal ECG When compared with ECG of 06-Nov-2020 06:48, No significant change was found    Recent Labs: 08/22/2023: ALT 19; BUN 14; Creatinine, Ser 0.93; Hemoglobin 16.5; Platelets 163.0; Potassium 4.0; Sodium 141    Lipid Panel    Component Value Date/Time   CHOL 136 08/22/2023 0943   TRIG 122.0 08/22/2023 0943   HDL 53.00 08/22/2023 0943   CHOLHDL 3 08/22/2023 0943  VLDL 24.4 08/22/2023 0943   LDLCALC 58 08/22/2023 0943   LDLDIRECT 71.0 01/03/2016 1201      Wt Readings from Last 3 Encounters:  11/27/23 202 lb 6 oz (91.8 kg)  11/06/23 207 lb (93.9 kg)  09/22/23 210 lb (95.3 kg)          01/17/2016    3:51 PM  PAD Screen  Previous PAD dx? No  Previous surgical procedure? No  Pain with walking? Yes  Subsides with rest? Yes  Feet/toe relief with dangling? No  Painful, non-healing ulcers? No  Extremities discolored? No      ASSESSMENT AND PLAN:  1.  Coronary artery disease involving native coronary arteries with recent angina: Known coronary artery disease with previous CABG in 2022.  He is now having recurrent exertional chest pain consistent with his prior angina.  This started about 1 month ago.  Recommend evaluation with cardiac PET scan.  He is not able to exercise on a treadmill due to leg fatigue.    2.  Hyperlipidemia: Continue treatment with atorvastatin .  Most recent lipid profile in May showed an LDL of 58.  3.  Essential hypertension: He is no longer on antihypertensive medications and his blood pressure is normal.  4.  Bilateral carotid stenosis: Most recent carotid Doppler showed moderate carotid stenosis.  Repeat  study in December of this year.  5.  Bilateral leg fatigue: His dorsalis pedis is abnormal bilaterally.  I requested an ABI.  6.  Screening for abdominal aortic aneurysm: He is above 65 and previous smoker.  I requested ultrasound for screening.    Disposition:   FU in 6 weeks.  Signed,   Deatrice Cage, MD  11/27/2023 10:10 AM    [facility]

## 2023-11-27 NOTE — Patient Instructions (Signed)
 Medication Instructions:  No changes *If you need a refill on your cardiac medications before your next appointment, please call your pharmacy*  Lab Work: None ordered If you have labs (blood work) drawn today and your tests are completely normal, you will receive your results only by: MyChart Message (if you have MyChart) OR A paper copy in the mail If you have any lab test that is abnormal or we need to change your treatment, we will call you to review the results.  Testing/Procedures: CARDIAC PET- Your physician has requested that you have a Cardiac Pet Stress Test.   This testing is completed at [facility] ([address] [place], [place] [address]) or [facility] ([address], [place], [place]). Please arrive 30 minutes prior to your scheduled time.  The schedulers will call you to get this scheduled. Please follow further testing instructions below.  Your physician has requested that you have an ankle brachial index (ABI). During this test an ultrasound and blood pressure cuff are used to evaluate the arteries that supply the arms and legs with blood.  Allow thirty minutes for this exam.  There are no restrictions or special instructions.  This will take place at [address] ([facility] [address], [place] [address]  Your physician has requested that you have an abdominal aorta duplex. During this test, an ultrasound is used to evaluate the aorta. Allow 30 minutes for this exam. Do not eat after midnight the day before and avoid carbonated beverages. This will take place at [address] ([facility], [place] [address]    Follow-Up: At [facility], you and your health needs are our priority.  As part of our continuing mission to provide you with exceptional heart care, our providers are all part of one team.  This team includes your primary Cardiologist (physician) and Advanced  Practice Providers or APPs (Physician Assistants and Nurse Practitioners) who all work together to provide you with the care you need, when you need it.  Your next appointment:   6 week(s)  Provider:   You may see Deatrice Cage, MD or one of the following Advanced Practice Providers on your designated Care Team:   Lonni Meager, NP Lesley Maffucci, PA-C Bernardino Bring, PA-C Cadence Darien, PA-C Tylene Lunch, NP Barnie Hila, NP    We recommend signing up for the patient portal called MyChart.  Sign up information is provided on this After Visit Summary.  MyChart is used to connect with patients for Virtual Visits (Telemedicine).  Patients are able to view lab/test results, encounter notes, upcoming appointments, etc.  Non-urgent messages can be sent to your provider as well.   To learn more about what you can do with MyChart, go to [email or web address].   Other Instructions    Please report to Radiology at the [facility] Main Entrance 30 minutes early for your test.  [address] [place], [place] [address]                         OR   Please report to Radiology at [facility] Main Entrance, medical mall, 30 mins prior to your test.  [address]  [place], [place]  How to Prepare for Your Cardiac PET/CT Stress Test:  Nothing to eat or drink, except water, 3 hours prior to arrival time.  NO caffeine/decaffeinated products, or chocolate 12 hours prior to arrival. (  Please note decaffeinated beverages (teas/coffees) still contain caffeine).  If you have caffeine within 12 hours prior, the test will need to be rescheduled.  Medication instructions: Nothing to hold  Diabetic Preparation: If able to eat breakfast prior to 3 hour fasting, you may take all medications, including your insulin . Do not worry if you miss your breakfast dose of insulin  - start at your next meal. If you do not eat prior to 3 hour fast-Hold all diabetes (oral  and insulin ) medications. Patients who wear a continuous glucose monitor MUST remove the device prior to scanning.  You may take your remaining medications with water.  NO perfume, cologne or lotion on chest or abdomen area. FEMALES - Please avoid wearing dresses to this appointment.  Total time is 1 to 2 hours; you may want to bring reading material for the waiting time.  IF YOU THINK YOU MAY BE PREGNANT, OR ARE NURSING PLEASE INFORM THE TECHNOLOGIST.  In preparation for your appointment, medication and supplies will be purchased.  Appointment availability is limited, so if you need to cancel or reschedule, please call the Radiology Department Scheduler at [phone] 24 hours in advance to avoid a cancellation fee of $100.00  What to Expect When you Arrive:  Once you arrive and check in for your appointment, you will be taken to a preparation room within the Radiology Department.  A technologist or Nurse will obtain your medical history, verify that you are correctly prepped for the exam, and explain the procedure.  Afterwards, an IV will be started in your arm and electrodes will be placed on your skin for EKG monitoring during the stress portion of the exam. Then you will be escorted to the PET/CT scanner.  There, staff will get you positioned on the scanner and obtain a blood pressure and EKG.  During the exam, you will continue to be connected to the EKG and blood pressure machines.  A small, safe amount of a radioactive tracer will be injected in your IV to obtain a series of pictures of your heart along with an injection of a stress agent.    After your Exam:  It is recommended that you eat a meal and drink a caffeinated beverage to counter act any effects of the stress agent.  Drink plenty of fluids for the remainder of the day and urinate frequently for the first couple of hours after the exam.  Your doctor will inform you of your test results within 7-10 business days.  For more  information and frequently asked questions, please visit our website: [email or web address]  For questions about your test or how to prepare for your test, please call: Cardiac Imaging Nurse Navigators Office: [phone]

## 2023-12-16 ENCOUNTER — Encounter (HOSPITAL_COMMUNITY): Payer: Self-pay

## 2023-12-18 ENCOUNTER — Ambulatory Visit
Admission: RE | Admit: 2023-12-18 | Discharge: 2023-12-18 | Disposition: A | Discharge status: Home / Self Care | Source: Ambulatory Visit | Attending: Cardiovascular Disease | Admitting: Cardiovascular Disease

## 2023-12-18 DIAGNOSIS — J849 Interstitial pulmonary disease, unspecified: Secondary | ICD-10-CM | POA: Insufficient documentation

## 2023-12-18 DIAGNOSIS — R079 Chest pain, unspecified: Secondary | ICD-10-CM | POA: Diagnosis not present

## 2023-12-18 DIAGNOSIS — J439 Emphysema, unspecified: Secondary | ICD-10-CM | POA: Diagnosis not present

## 2023-12-18 LAB — NM PET CT CARDIAC PERFUSION MULTI W/ABSOLUTE BLOODFLOW
LV dias vol: 100 mL (ref 62–150)
LV sys vol: 31 mL (ref 4.2–5.8)
Nuc Rest EF: 66 %
Nuc Stress EF: 69 %
Peak HR: 82 {beats}/min
Rest HR: 69 {beats}/min
Rest Nuclear Isotope Dose: 23.7 mCi
SRS: 0
SSS: 2
ST Depression (mm): 0 mm
Stress Nuclear Isotope Dose: 23.7 mCi
TID: 1.09

## 2023-12-18 MED ORDER — REGADENOSON 0.4 MG/5ML IV SOLN
INTRAVENOUS | Status: AC
Start: 2023-12-18 — End: 2023-12-18
  Filled 2023-12-18: qty 5

## 2023-12-18 MED ORDER — RUBIDIUM RB82 GENERATOR (RUBYFILL)
25.0000 | PACK | Freq: Once | INTRAVENOUS | Status: AC
Start: 2023-12-18 — End: 2023-12-18
  Administered 2023-12-18: 23.7 via INTRAVENOUS

## 2023-12-18 MED ORDER — REGADENOSON 0.4 MG/5ML IV SOLN
0.4000 mg | Freq: Once | INTRAVENOUS | Status: AC
  Administered 2023-12-18: 0.4 mg via INTRAVENOUS
  Filled 2023-12-18: qty 5

## 2023-12-18 NOTE — Progress Notes (Signed)
 Patient presents for a cardiac PET stress test and tolerated procedure without incident. Patient maintained acceptable vital signs throughout the test and was offered caffeine  after test.  Patient ambulated out of department with a steady gait.

## 2023-12-21 DIAGNOSIS — G4733 Obstructive sleep apnea (adult) (pediatric): Secondary | ICD-10-CM | POA: Diagnosis not present

## 2023-12-22 ENCOUNTER — Ambulatory Visit: Payer: Self-pay | Admitting: Cardiovascular Disease

## 2023-12-22 NOTE — Telephone Encounter (Signed)
 Patient made aware of results and verbalized understanding.  The patient stated that he would like to think about it for the next few days and will call back.

## 2023-12-22 NOTE — Telephone Encounter (Signed)
-----   Message from Storla sent at 12/22/2023  8:18 AM EDT ----- Inform patient that  stress test is abnormal with evidence of anterior wall ischemia.  If he is still having exertional chest pain, I recommend proceeding with left heart catheterization and possible  PCI.  This can be done at [facility]. ----- Message ----- From: Interface, Rad Results In Sent: 12/18/2023   3:49 PM EDT To: Deatrice DELENA Cage, MD

## 2023-12-24 ENCOUNTER — Encounter: Payer: Self-pay | Admitting: Cardiovascular Disease

## 2023-12-25 NOTE — Telephone Encounter (Signed)
 I tried calling him but there was no answer.  The stress test was abnormal and intermediate risk.  If he is still having exertional chest pain, I recommend that we go ahead and schedule the left heart catheterization.  If his symptoms are improved, we can monitor the situation for now.

## 2023-12-29 NOTE — Telephone Encounter (Signed)
 Pt returning Lisas call about heart cath. Please advise.

## 2023-12-31 NOTE — H&P (View-Only) (Signed)
 Cardiology Office Note:    Date:  01/01/2024   ID:  Hayden Deleon English, DOB Jan 31, 1956, MRN [id number]  PCP:  Berneta Elsie Sayre, MD   [facility] Providers Cardiologist:  Deatrice Cage, MD     Referring MD: Berneta Elsie Sayre,*   Chief complaint: Chest pain follow up  History of Present Illness:    Hayden Deleon is a 68 y.o. male with a hx of CAD s/p CABG X 3 LIMA-LAD, SVG-PDA, SVG-OM2, HLD, COPD, HTN, previous tobacco, use presenting to the office today for continued work up for CAD and stable angina.   Worsening angina in 2022 l/t abnormal lexiscan  myoview  with inferior lateral and apical ischemia. Cardiac catheterization in 10/2020 demonstrating severe ostial LM stenosis and subtotal occlusion of the mid RCA w/ occluded OM1. Normal EF. CABG was pursued.   Echo in 01/2022 showed hyperdynamic LV systolic function with no significant valvular abnormalities.   Patient reported chest tightness over the month of August that resolved with rest, consistent with previous anginal symptoms. Cardiac PET scan was ordered.   NM PET on 12/18/2023 demonstrated abnormal LV perfusion with evidence of ischemia. Defect 1: There is a medium defect with mild reduction in uptake present in the basal anterior, anterolateral and anteroseptal location(s) that is reversible. There is normal wall motion in the defect area. Consistent with ischemia.  Rest left ventricular function is normal. Rest EF: 66%. Stress left ventricular function is normal. Stress EF: 69%. End diastolic cavity size is normal. End systolic cavity size is normal.  Findings are consistent with ischemia. The study is intermediate risk  Patient presents to the office appearing stable from a cardiac standpoint, asymptomatic today. Continues to have chest pain/tightness with exertion similar to the symptoms he was having prior to his CABG surgery. Denies associated SOB, nausea, diaphoresis. Denies radiation of pain. Reports  SOB 2/2 COPD at baseline. States he frequently checks his O2 sat, and that his normal ranges from 85-91% at rest. Denies palpitations, N/V, dark/tarry/bloody stools/urine, weight gain/loss, leg swelling. 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   Past Medical History:  Diagnosis Date   Allergy    Anxiety    Arthritis    right shoulder   CAD (coronary artery disease)    a. 2003 Cath: OM1 100%, good collats, RCA 60-70p-->med rx; b. 04/2020 MV: mild ischemia to sm area of mid inflat & apical lat segments. EF >65%; d. 10/2020 Cath: LM 85ost, LAD 30p/m, OM1 100, RCA 99p/m; e. 10/2020 CABG x 3: LIMA->LAD, VG->RPDA, VG->OM2.   Carotid artery disease    a. 03/2020 Carotid U/S: RICA 1-39%, LICA 40-59%; b. 01/2021 U/S: 40-59% bilat ICA stenoses.   COPD (chronic obstructive pulmonary disease) (HCC)    Significant Dr. Shelah   Diastolic dysfunction    a. 03/2018 Echo: EF 60-65%, no rwma, Gr1 DD. Mildly dil LA. Nl RV fxn.   Dilation of ascending aorta and aortic root (HCC)    a. 10/2020 Echo: Asc Ao 40mm.   Diverticulosis of colon    Emphysema of lung (HCC)    GERD (gastroesophageal reflux disease)    Gout    Hyperlipidemia    Hypertension    LVH (left ventricular hypertrophy)    a. 10/2020 Echo: EF 70-75%, no rwma, mod LVH. Nl RV size/fxn. Mildly dil RA. Triv MR. Asc Ao 40mm.   Prostatitis    Sleep apnea    uses cpap  nightly    Past Surgical History:  Procedure Laterality Date   COLONOSCOPY  12/2003   patterson - polyp   CORONARY ARTERY BYPASS GRAFT N/A 11/05/2020   Procedure: CORONARY ARTERY BYPASS GRAFTING (CABG) TIMES 3, USING LEFT INTERNAL MAMMARY ARTERY AND GREATER SAPHEOUS VEIN HARVESTED ENDOSCOPICALLY LEFT AND RIGHT LEGS;  Surgeon: Shyrl Linnie KIDD, MD;  Location: [facility] OR;  Service: Open Heart Surgery;  Laterality: N/A;   deviated septum     JOINT REPLACEMENT     KNEE ARTHROSCOPY     right    LEFT HEART CATH AND CORONARY ANGIOGRAPHY N/A 11/03/2020   Procedure: LEFT HEART CATH AND CORONARY ANGIOGRAPHY;  Surgeon: Darron Deatrice LABOR, MD;  Location: [facility] INVASIVE CV LAB;  Service: Cardiovascular;  Laterality: N/A;   TOTAL SHOULDER ARTHROPLASTY Right 02/03/2018   Procedure: RIGHT REVERSE SHOULDER ARTHROPLASTY;  Surgeon: Addie Cordella Hamilton, MD;  Location: [facility] OR;  Service: Orthopedics;  Laterality: Right;   WISDOM TOOTH EXTRACTION      Current Medications: Current Meds  Medication Sig   albuterol  (VENTOLIN  HFA) 108 (90 Base) MCG/ACT inhaler TAKE 2 PUFFS BY MOUTH EVERY 6 HOURS AS NEEDED FOR WHEEZE OR SHORTNESS OF BREATH   aspirin  EC 325 MG EC tablet Take 1 tablet (325 mg total) by mouth daily.   atorvastatin  (LIPITOR ) 80 MG tablet TAKE 1 TABLET BY MOUTH EVERY DAY   dextromethorphan-guaiFENesin  (MUCINEX  DM) 30-600 MG 12hr tablet Take 1 tablet by mouth 2 (two) times daily.   fluticasone  (FLONASE ) 50 MCG/ACT nasal spray Place 2 sprays into both nostrils daily.   Fluticasone -Umeclidin-Vilant (TRELEGY ELLIPTA ) 100-62.5-25 MCG/ACT AEPB Inhale 1 puff into the lungs daily.   Multiple Vitamin (MULTIVITAMIN) capsule Take 1 capsule by mouth daily.   nitroGLYCERIN  (NITROSTAT ) 0.4 MG SL tablet Place 1 tablet (0.4 mg total) under the tongue every 5 (five) minutes as needed for chest pain.   sertraline  (ZOLOFT ) 50 MG tablet Take 1 tablet (50 mg total) by mouth daily.     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    Employer: [employer]  Tobacco Use   Smoking status: Former    Current packs/day: 0.00    Average packs/day: 2.0 packs/day  for 32.0 years (64.0 ttl pk-yrs)    Types: Cigarettes    Start date: 08/31/1967    Quit date: 08/31/1999    Years since quitting: 24.3   Smokeless tobacco: Former  Building services engineer status: Never Used  Substance and Sexual Activity   Alcohol use: No    Alcohol/week: 0.0 standard drinks of alcohol   Drug use: No   Sexual activity: Yes  Other Topics Concern   Not on file  Social History Narrative   Not on file   Social Drivers of Health   Financial Resource Strain: Low Risk  (11/04/2023)   Overall Financial Resource Strain (CARDIA)    Difficulty of Paying Living Expenses: Not hard at all  Food Insecurity: No Food Insecurity (11/04/2023)   Hunger Vital Sign    Worried About Running Out of Food in the Last Year: Never true    Ran Out of Food in the  Last Year: Never true  Transportation Needs: No Transportation Needs (11/04/2023)   PRAPARE - Administrator, Civil Service (Medical): No    Lack of Transportation (Non-Medical): No  Physical Activity: Insufficiently Active (11/04/2023)   Exercise Vital Sign    Days of Exercise per Week: 4 days    Minutes of Exercise per Session: 30 min  Stress: No Stress Concern Present (11/04/2023)   [employer] of Occupational Health - Occupational Stress Questionnaire    Feeling of Stress: Only a little  Social Connections: Unknown (11/04/2023)   Social Connection and Isolation Panel    Frequency of Communication with Friends and Family: Twice a week    Frequency of Social Gatherings with Friends and Family: Not on file    Attends Religious Services: More than 4 times per year    Active Member of [employer] or Organizations: Yes    Attends Engineer, structural: More than 4 times per year    Marital Status: Married     Family History: The patient's family history includes Angina in his mother; Asthma in his father; Crohn's disease in his sister. There is no history of Colon cancer, Esophageal cancer, Rectal cancer, or Stomach  cancer.  ROS:   Please see the history of present illness.     All other systems reviewed and are negative.  EKGs/Labs/Other Studies Reviewed:    The following studies were reviewed today:  EKG Interpretation Date/Time:  Thursday January 01 2024 10:58:01 EDT Ventricular Rate:  64 PR Interval:  184 QRS Duration:  96 QT Interval:  458 QTC Calculation: 472 R Axis:   68  Text Interpretation: Normal sinus rhythm Normal ECG When compared with ECG of 27-Nov-2023 09:37, Questionable change in QRS axis Confirmed by Demarques Pilz [phone]) on 01/01/2024 1:05:43 PM    Recent Labs: 08/22/2023: ALT 19; BUN 14; Creatinine, Ser 0.93; Hemoglobin 16.5; Platelets 163.0; Potassium 4.0; Sodium 141  Recent Lipid Panel    Component Value Date/Time   CHOL 136 08/22/2023 0943   TRIG 122.0 08/22/2023 0943   HDL 53.00 08/22/2023 0943   CHOLHDL 3 08/22/2023 0943   VLDL 24.4 08/22/2023 0943   LDLCALC 58 08/22/2023 0943   LDLDIRECT 71.0 01/03/2016 1201     Physical Exam:    VS:  BP 122/62 (BP Location: Left Arm, Patient Position: Sitting, Cuff Size: Normal)   Pulse 64   Ht 5' 11 (1.803 m)   Wt 203 lb 6 oz (92.3 kg)   SpO2 91%   BMI 28.37 kg/m     Wt Readings from Last 3 Encounters:  01/01/24 203 lb 6 oz (92.3 kg)  11/27/23 202 lb 6 oz (91.8 kg)  11/06/23 207 lb (93.9 kg)     GEN:  Well nourished, well developed in no acute distress HEENT: Normal NECK:  No carotid bruits CARDIAC: RRR, no murmurs, rubs, gallops RESPIRATORY:  Clear to auscultation without rales, wheezing or rhonchi  MUSCULOSKELETAL:  No edema; No deformity  SKIN: Warm and dry NEUROLOGIC:  Alert and oriented x 3 PSYCHIATRIC:  Normal affect   ASSESSMENT:    1. Coronary artery disease involving native coronary artery of native heart with unstable angina pectoris (HCC)   2. Primary hypertension   3. Hyperlipidemia LDL goal <55    PLAN:    In order of problems listed above:  CAD s/p CABG X 3 in 2022 Abnormal  stress test Chest pain Chest tightness w/ exertion similar to pre-cabg angina Given the patient's symptoms and  recent NM PET stress test with concerning findings for ischemia, will follow Dr. Renato recommendations of proceeding with cardiac catheterization. Discussed the risks versus benefits, as well as the alternatives to proceeding with cardiac catheterization with the patient today.  Patient verbalizes understanding and is agreeable to moving forward with cardiac catheterization Continue aspirin  EC 81 mg daily Continue atorvastatin  80 mg daily Will add nitroglycerin  0.4 mg SL tab PRN chest pain every five minutes  Hyperlipidemia, LDL goal <55 Lipid panel 08/22/2023: Cholesterol 136, HDL 53, LDL 58, triglycerides 122 Continue atorvastatin  80 mg daily Could consider adding Zetia 10 mg daily to achieve LDL goal of <55 at follow up visit  HTN Stable on current medications  Follow up 2 weeks after cardiac catheterization  Informed Consent   Shared Decision Making/Informed Consent The risks [stroke (1 in 1000), death (1 in 1000), kidney failure [usually temporary] (1 in 500), bleeding (1 in 200), allergic reaction [possibly serious] (1 in 200)], benefits (diagnostic support and management of coronary artery disease) and alternatives of a cardiac catheterization were discussed in detail with Mr. Raulston and he is willing to proceed.       Medication Adjustments/Labs and Tests Ordered: Current medicines are reviewed at length with the patient today.  Concerns regarding medicines are outlined above.  Orders Placed This Encounter  Procedures   CBC   Basic metabolic panel with GFR   EKG 87-Ozji   Meds ordered this encounter  Medications   nitroGLYCERIN  (NITROSTAT ) 0.4 MG SL tablet    Sig: Place 1 tablet (0.4 mg total) under the tongue every 5 (five) minutes as needed for chest pain.    Dispense:  25 tablet    Refill:  3    Patient Instructions  Medication Instructions:   Your  physician recommends the following medication changes.  START TAKING: Nitroglycerin  0.4 mg as needed for chest pain The proper use and anticipated side effects of nitroglycerine has been carefully explained.  If a single episode of chest pain is not relieved by one tablet, the patient will try another within 5 minutes; and if this doesn't relieve the pain, the patient is instructed to call 911 for transportation to an emergency department.   Your physician recommends that you continue on your current medications as directed. Please refer to the Current Medication list given to you today.   *If you need a refill on your cardiac medications before your next appointment, please call your pharmacy*  Lab Work: Your provider would like for you to have following labs drawn today BMP, CBC.   If you have labs (blood work) drawn today and your tests are completely normal, you will receive your results only by: MyChart Message (if you have MyChart) OR A paper copy in the mail If you have any lab test that is abnormal or we need to change your treatment, we will call you to review the results.  Testing/Procedures:  [facility] DEPT OF [facility] [place] [address] [place] [address] Dept: [phone] Loc: [phone]  Deejay Koppelman  01/01/2024  You are scheduled for a Cardiac Catheterization on Monday, October 6 with Dr. Deatrice Cage.  1. Please arrive at the [facility] Entrance of [facility], [address], [place] [address] at 8:30 AM (This is 1 hour hour(s) prior to your procedure time).  Proceed to the Check-In Desk directly inside the entrance.  Procedure Parking: Use the entrance  off of the [address] side of the hospital. Turn right upon entering and follow the driveway to parking that is directly in front of the [facility] Center. There is no valet parking available at  this entrance, however there is an awning directly in front of the Heart & Vascular Center for drop off/ pick up for patients.  Special note: Every effort is made to have your procedure done on time. Please understand that emergencies sometimes delay scheduled procedures.  2. Diet: NPO: Nothing to eat OR drink after midnight. (For TEE and Cath the same day)   3. Hydration: You need to be well hydrated before your procedure. On October 6, you may drink approved liquids (see below) until 2 hours before the procedure, with 16 oz of water as your last intake.   List of approved liquids water, clear juice, clear tea, black coffee, fruit juices, non-citric and without pulp, carbonated beverages, Gatorade, Kool -Aid, plain Jello-O and plain ice popsicles.  4. Labs: You will need to have blood drawn on Today at [facility] Entrance, Go to 1st desk on your right to register.  Address: [address] [place], [place] [address]  Open: 8am - 5pm  Phone: [phone]. You do not need to be fasting.  5. Medication instructions in preparation for your procedure:   Contrast Allergy: No    Current Outpatient Medications (Cardiovascular):    atorvastatin  (LIPITOR ) 80 MG tablet, TAKE 1 TABLET BY MOUTH EVERY DAY  Current Outpatient Medications (Respiratory):    albuterol  (VENTOLIN  HFA) 108 (90 Base) MCG/ACT inhaler, TAKE 2 PUFFS BY MOUTH EVERY 6 HOURS AS NEEDED FOR WHEEZE OR SHORTNESS OF BREATH   dextromethorphan-guaiFENesin  (MUCINEX  DM) 30-600 MG 12hr tablet, Take 1 tablet by mouth 2 (two) times daily.   fluticasone  (FLONASE ) 50 MCG/ACT nasal spray, Place 2 sprays into both nostrils daily.   Fluticasone -Umeclidin-Vilant (TRELEGY ELLIPTA ) 100-62.5-25 MCG/ACT AEPB, Inhale 1 puff into the lungs daily.   Fluticasone -Umeclidin-Vilant (TRELEGY ELLIPTA ) 100-62.5-25 MCG/ACT AEPB, Inhale 1 puff into the lungs daily. (Patient not taking: Reported on 01/01/2024)  Current Outpatient Medications (Analgesics):     aspirin  EC 325 MG EC tablet, Take 1 tablet (325 mg total) by mouth daily.   Current Outpatient Medications (Other):    Multiple Vitamin (MULTIVITAMIN) capsule, Take 1 capsule by mouth daily.   sertraline  (ZOLOFT ) 50 MG tablet, Take 1 tablet (50 mg total) by mouth daily. *For reference purposes while preparing patient instructions.   Delete this med list prior to printing instructions for patient.*  There are no medications on your list that you need to hold.  On the morning of your procedure, take your Aspirin  325 mg and any morning medicines NOT listed above.  You may use sips of water.  6. Plan to go home the same day, you will only stay overnight if medically necessary. 7. Bring a current list of your medications and current insurance cards. 8. You MUST have a responsible person to drive you home. 9. Someone MUST be with you the first 24 hours after you arrive home or your discharge will be delayed. 10. Please wear clothes that are easy to get on and off and wear slip-on shoes.  Thank you for allowing us  to care for you!   -- [facility]   Follow-Up: At [facility], you and your health needs are our priority.  As part of our continuing mission to provide you with exceptional heart care, our providers are  all part of one team.  This team includes your primary Cardiologist (physician) and Advanced Practice Providers or APPs (Physician Assistants and Nurse Practitioners) who all work together to provide you with the care you need, when you need it.  Your next appointment:   2 week(s) After the procedure  Provider:   Deatrice Cage, MD or Lonni Meager, NP    We recommend signing up for the patient portal called MyChart.  Sign up information is provided on this After Visit Summary.  MyChart is used to connect with patients for Virtual Visits (Telemedicine).  Patients are able to view lab/test results, encounter notes, upcoming  appointments, etc.  Non-urgent messages can be sent to your provider as well.   To learn more about what you can do with MyChart, go to [email or web address].    Signed, Miriam FORBES Shams, NP  01/01/2024 5:08 PM    [facility]

## 2023-12-31 NOTE — Progress Notes (Unsigned)
 Cardiology Office Note:    Date:  01/01/2024   ID:  Hayden Deleon English, DOB Jan 31, 1956, MRN [id number]  PCP:  Berneta Elsie Sayre, MD   [facility] Providers Cardiologist:  Deatrice Cage, MD     Referring MD: Berneta Elsie Sayre,*   Chief complaint: Chest pain follow up  History of Present Illness:    Hayden Deleon is a 68 y.o. male with a hx of CAD s/p CABG X 3 LIMA-LAD, SVG-PDA, SVG-OM2, HLD, COPD, HTN, previous tobacco, use presenting to the office today for continued work up for CAD and stable angina.   Worsening angina in 2022 l/t abnormal lexiscan  myoview  with inferior lateral and apical ischemia. Cardiac catheterization in 10/2020 demonstrating severe ostial LM stenosis and subtotal occlusion of the mid RCA w/ occluded OM1. Normal EF. CABG was pursued.   Echo in 01/2022 showed hyperdynamic LV systolic function with no significant valvular abnormalities.   Patient reported chest tightness over the month of August that resolved with rest, consistent with previous anginal symptoms. Cardiac PET scan was ordered.   NM PET on 12/18/2023 demonstrated abnormal LV perfusion with evidence of ischemia. Defect 1: There is a medium defect with mild reduction in uptake present in the basal anterior, anterolateral and anteroseptal location(s) that is reversible. There is normal wall motion in the defect area. Consistent with ischemia.  Rest left ventricular function is normal. Rest EF: 66%. Stress left ventricular function is normal. Stress EF: 69%. End diastolic cavity size is normal. End systolic cavity size is normal.  Findings are consistent with ischemia. The study is intermediate risk  Patient presents to the office appearing stable from a cardiac standpoint, asymptomatic today. Continues to have chest pain/tightness with exertion similar to the symptoms he was having prior to his CABG surgery. Denies associated SOB, nausea, diaphoresis. Denies radiation of pain. Reports  SOB 2/2 COPD at baseline. States he frequently checks his O2 sat, and that his normal ranges from 85-91% at rest. Denies palpitations, N/V, dark/tarry/bloody stools/urine, weight gain/loss, leg swelling. 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   Past Medical History:  Diagnosis Date   Allergy    Anxiety    Arthritis    right shoulder   CAD (coronary artery disease)    a. 2003 Cath: OM1 100%, good collats, RCA 60-70p-->med rx; b. 04/2020 MV: mild ischemia to sm area of mid inflat & apical lat segments. EF >65%; d. 10/2020 Cath: LM 85ost, LAD 30p/m, OM1 100, RCA 99p/m; e. 10/2020 CABG x 3: LIMA->LAD, VG->RPDA, VG->OM2.   Carotid artery disease    a. 03/2020 Carotid U/S: RICA 1-39%, LICA 40-59%; b. 01/2021 U/S: 40-59% bilat ICA stenoses.   COPD (chronic obstructive pulmonary disease) (HCC)    Significant Dr. Shelah   Diastolic dysfunction    a. 03/2018 Echo: EF 60-65%, no rwma, Gr1 DD. Mildly dil LA. Nl RV fxn.   Dilation of ascending aorta and aortic root (HCC)    a. 10/2020 Echo: Asc Ao 40mm.   Diverticulosis of colon    Emphysema of lung (HCC)    GERD (gastroesophageal reflux disease)    Gout    Hyperlipidemia    Hypertension    LVH (left ventricular hypertrophy)    a. 10/2020 Echo: EF 70-75%, no rwma, mod LVH. Nl RV size/fxn. Mildly dil RA. Triv MR. Asc Ao 40mm.   Prostatitis    Sleep apnea    uses cpap  nightly    Past Surgical History:  Procedure Laterality Date   COLONOSCOPY  12/2003   patterson - polyp   CORONARY ARTERY BYPASS GRAFT N/A 11/05/2020   Procedure: CORONARY ARTERY BYPASS GRAFTING (CABG) TIMES 3, USING LEFT INTERNAL MAMMARY ARTERY AND GREATER SAPHEOUS VEIN HARVESTED ENDOSCOPICALLY LEFT AND RIGHT LEGS;  Surgeon: Shyrl Linnie KIDD, MD;  Location: [facility] OR;  Service: Open Heart Surgery;  Laterality: N/A;   deviated septum     JOINT REPLACEMENT     KNEE ARTHROSCOPY     right    LEFT HEART CATH AND CORONARY ANGIOGRAPHY N/A 11/03/2020   Procedure: LEFT HEART CATH AND CORONARY ANGIOGRAPHY;  Surgeon: Darron Deatrice LABOR, MD;  Location: [facility] INVASIVE CV LAB;  Service: Cardiovascular;  Laterality: N/A;   TOTAL SHOULDER ARTHROPLASTY Right 02/03/2018   Procedure: RIGHT REVERSE SHOULDER ARTHROPLASTY;  Surgeon: Addie Cordella Hamilton, MD;  Location: [facility] OR;  Service: Orthopedics;  Laterality: Right;   WISDOM TOOTH EXTRACTION      Current Medications: Current Meds  Medication Sig   albuterol  (VENTOLIN  HFA) 108 (90 Base) MCG/ACT inhaler TAKE 2 PUFFS BY MOUTH EVERY 6 HOURS AS NEEDED FOR WHEEZE OR SHORTNESS OF BREATH   aspirin  EC 325 MG EC tablet Take 1 tablet (325 mg total) by mouth daily.   atorvastatin  (LIPITOR ) 80 MG tablet TAKE 1 TABLET BY MOUTH EVERY DAY   dextromethorphan-guaiFENesin  (MUCINEX  DM) 30-600 MG 12hr tablet Take 1 tablet by mouth 2 (two) times daily.   fluticasone  (FLONASE ) 50 MCG/ACT nasal spray Place 2 sprays into both nostrils daily.   Fluticasone -Umeclidin-Vilant (TRELEGY ELLIPTA ) 100-62.5-25 MCG/ACT AEPB Inhale 1 puff into the lungs daily.   Multiple Vitamin (MULTIVITAMIN) capsule Take 1 capsule by mouth daily.   nitroGLYCERIN  (NITROSTAT ) 0.4 MG SL tablet Place 1 tablet (0.4 mg total) under the tongue every 5 (five) minutes as needed for chest pain.   sertraline  (ZOLOFT ) 50 MG tablet Take 1 tablet (50 mg total) by mouth daily.     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    Employer: [employer]  Tobacco Use   Smoking status: Former    Current packs/day: 0.00    Average packs/day: 2.0 packs/day  for 32.0 years (64.0 ttl pk-yrs)    Types: Cigarettes    Start date: 08/31/1967    Quit date: 08/31/1999    Years since quitting: 24.3   Smokeless tobacco: Former  Building services engineer status: Never Used  Substance and Sexual Activity   Alcohol use: No    Alcohol/week: 0.0 standard drinks of alcohol   Drug use: No   Sexual activity: Yes  Other Topics Concern   Not on file  Social History Narrative   Not on file   Social Drivers of Health   Financial Resource Strain: Low Risk  (11/04/2023)   Overall Financial Resource Strain (CARDIA)    Difficulty of Paying Living Expenses: Not hard at all  Food Insecurity: No Food Insecurity (11/04/2023)   Hunger Vital Sign    Worried About Running Out of Food in the Last Year: Never true    Ran Out of Food in the  Last Year: Never true  Transportation Needs: No Transportation Needs (11/04/2023)   PRAPARE - Administrator, Civil Service (Medical): No    Lack of Transportation (Non-Medical): No  Physical Activity: Insufficiently Active (11/04/2023)   Exercise Vital Sign    Days of Exercise per Week: 4 days    Minutes of Exercise per Session: 30 min  Stress: No Stress Concern Present (11/04/2023)   [employer] of Occupational Health - Occupational Stress Questionnaire    Feeling of Stress: Only a little  Social Connections: Unknown (11/04/2023)   Social Connection and Isolation Panel    Frequency of Communication with Friends and Family: Twice a week    Frequency of Social Gatherings with Friends and Family: Not on file    Attends Religious Services: More than 4 times per year    Active Member of [employer] or Organizations: Yes    Attends Engineer, structural: More than 4 times per year    Marital Status: Married     Family History: The patient's family history includes Angina in his mother; Asthma in his father; Crohn's disease in his sister. There is no history of Colon cancer, Esophageal cancer, Rectal cancer, or Stomach  cancer.  ROS:   Please see the history of present illness.     All other systems reviewed and are negative.  EKGs/Labs/Other Studies Reviewed:    The following studies were reviewed today:  EKG Interpretation Date/Time:  Thursday January 01 2024 10:58:01 EDT Ventricular Rate:  64 PR Interval:  184 QRS Duration:  96 QT Interval:  458 QTC Calculation: 472 R Axis:   68  Text Interpretation: Normal sinus rhythm Normal ECG When compared with ECG of 27-Nov-2023 09:37, Questionable change in QRS axis Confirmed by Demarques Pilz [phone]) on 01/01/2024 1:05:43 PM    Recent Labs: 08/22/2023: ALT 19; BUN 14; Creatinine, Ser 0.93; Hemoglobin 16.5; Platelets 163.0; Potassium 4.0; Sodium 141  Recent Lipid Panel    Component Value Date/Time   CHOL 136 08/22/2023 0943   TRIG 122.0 08/22/2023 0943   HDL 53.00 08/22/2023 0943   CHOLHDL 3 08/22/2023 0943   VLDL 24.4 08/22/2023 0943   LDLCALC 58 08/22/2023 0943   LDLDIRECT 71.0 01/03/2016 1201     Physical Exam:    VS:  BP 122/62 (BP Location: Left Arm, Patient Position: Sitting, Cuff Size: Normal)   Pulse 64   Ht 5' 11 (1.803 m)   Wt 203 lb 6 oz (92.3 kg)   SpO2 91%   BMI 28.37 kg/m     Wt Readings from Last 3 Encounters:  01/01/24 203 lb 6 oz (92.3 kg)  11/27/23 202 lb 6 oz (91.8 kg)  11/06/23 207 lb (93.9 kg)     GEN:  Well nourished, well developed in no acute distress HEENT: Normal NECK:  No carotid bruits CARDIAC: RRR, no murmurs, rubs, gallops RESPIRATORY:  Clear to auscultation without rales, wheezing or rhonchi  MUSCULOSKELETAL:  No edema; No deformity  SKIN: Warm and dry NEUROLOGIC:  Alert and oriented x 3 PSYCHIATRIC:  Normal affect   ASSESSMENT:    1. Coronary artery disease involving native coronary artery of native heart with unstable angina pectoris (HCC)   2. Primary hypertension   3. Hyperlipidemia LDL goal <55    PLAN:    In order of problems listed above:  CAD s/p CABG X 3 in 2022 Abnormal  stress test Chest pain Chest tightness w/ exertion similar to pre-cabg angina Given the patient's symptoms and  recent NM PET stress test with concerning findings for ischemia, will follow Dr. Renato recommendations of proceeding with cardiac catheterization. Discussed the risks versus benefits, as well as the alternatives to proceeding with cardiac catheterization with the patient today.  Patient verbalizes understanding and is agreeable to moving forward with cardiac catheterization Continue aspirin  EC 81 mg daily Continue atorvastatin  80 mg daily Will add nitroglycerin  0.4 mg SL tab PRN chest pain every five minutes  Hyperlipidemia, LDL goal <55 Lipid panel 08/22/2023: Cholesterol 136, HDL 53, LDL 58, triglycerides 122 Continue atorvastatin  80 mg daily Could consider adding Zetia 10 mg daily to achieve LDL goal of <55 at follow up visit  HTN Stable on current medications  Follow up 2 weeks after cardiac catheterization  Informed Consent   Shared Decision Making/Informed Consent The risks [stroke (1 in 1000), death (1 in 1000), kidney failure [usually temporary] (1 in 500), bleeding (1 in 200), allergic reaction [possibly serious] (1 in 200)], benefits (diagnostic support and management of coronary artery disease) and alternatives of a cardiac catheterization were discussed in detail with Mr. Raulston and he is willing to proceed.       Medication Adjustments/Labs and Tests Ordered: Current medicines are reviewed at length with the patient today.  Concerns regarding medicines are outlined above.  Orders Placed This Encounter  Procedures   CBC   Basic metabolic panel with GFR   EKG 87-Ozji   Meds ordered this encounter  Medications   nitroGLYCERIN  (NITROSTAT ) 0.4 MG SL tablet    Sig: Place 1 tablet (0.4 mg total) under the tongue every 5 (five) minutes as needed for chest pain.    Dispense:  25 tablet    Refill:  3    Patient Instructions  Medication Instructions:   Your  physician recommends the following medication changes.  START TAKING: Nitroglycerin  0.4 mg as needed for chest pain The proper use and anticipated side effects of nitroglycerine has been carefully explained.  If a single episode of chest pain is not relieved by one tablet, the patient will try another within 5 minutes; and if this doesn't relieve the pain, the patient is instructed to call 911 for transportation to an emergency department.   Your physician recommends that you continue on your current medications as directed. Please refer to the Current Medication list given to you today.   *If you need a refill on your cardiac medications before your next appointment, please call your pharmacy*  Lab Work: Your provider would like for you to have following labs drawn today BMP, CBC.   If you have labs (blood work) drawn today and your tests are completely normal, you will receive your results only by: MyChart Message (if you have MyChart) OR A paper copy in the mail If you have any lab test that is abnormal or we need to change your treatment, we will call you to review the results.  Testing/Procedures:  [place] A DEPT OF [facility] [place] [place] [address] [place] [address] Dept: [phone] Loc: [phone]  Deejay Koppelman  01/01/2024  You are scheduled for a Cardiac Catheterization on Monday, October 6 with Dr. Deatrice Cage.  1. Please arrive at the [facility] Entrance of [facility], [address], [place] [address] at 8:30 AM (This is 1 hour hour(s) prior to your procedure time).  Proceed to the Check-In Desk directly inside the entrance.  Procedure Parking: Use the entrance  off of the [address] side of the hospital. Turn right upon entering and follow the driveway to parking that is directly in front of the Heart & Vascular Center. There is no valet parking available at  this entrance, however there is an awning directly in front of the Heart & Vascular Center for drop off/ pick up for patients.  Special note: Every effort is made to have your procedure done on time. Please understand that emergencies sometimes delay scheduled procedures.  2. Diet: NPO: Nothing to eat OR drink after midnight. (For TEE and Cath the same day)   3. Hydration: You need to be well hydrated before your procedure. On October 6, you may drink approved liquids (see below) until 2 hours before the procedure, with 16 oz of water as your last intake.   List of approved liquids water, clear juice, clear tea, black coffee, fruit juices, non-citric and without pulp, carbonated beverages, Gatorade, Kool -Aid, plain Jello-O and plain ice popsicles.  4. Labs: You will need to have blood drawn on Today at [facility] Entrance, Go to 1st desk on your right to register.  Address: [address] [place], [place] [address]  Open: 8am - 5pm  Phone: [phone]. You do not need to be fasting.  5. Medication instructions in preparation for your procedure:   Contrast Allergy: No    Current Outpatient Medications (Cardiovascular):    atorvastatin  (LIPITOR ) 80 MG tablet, TAKE 1 TABLET BY MOUTH EVERY DAY  Current Outpatient Medications (Respiratory):    albuterol  (VENTOLIN  HFA) 108 (90 Base) MCG/ACT inhaler, TAKE 2 PUFFS BY MOUTH EVERY 6 HOURS AS NEEDED FOR WHEEZE OR SHORTNESS OF BREATH   dextromethorphan-guaiFENesin  (MUCINEX  DM) 30-600 MG 12hr tablet, Take 1 tablet by mouth 2 (two) times daily.   fluticasone  (FLONASE ) 50 MCG/ACT nasal spray, Place 2 sprays into both nostrils daily.   Fluticasone -Umeclidin-Vilant (TRELEGY ELLIPTA ) 100-62.5-25 MCG/ACT AEPB, Inhale 1 puff into the lungs daily.   Fluticasone -Umeclidin-Vilant (TRELEGY ELLIPTA ) 100-62.5-25 MCG/ACT AEPB, Inhale 1 puff into the lungs daily. (Patient not taking: Reported on 01/01/2024)  Current Outpatient Medications (Analgesics):     aspirin  EC 325 MG EC tablet, Take 1 tablet (325 mg total) by mouth daily.   Current Outpatient Medications (Other):    Multiple Vitamin (MULTIVITAMIN) capsule, Take 1 capsule by mouth daily.   sertraline  (ZOLOFT ) 50 MG tablet, Take 1 tablet (50 mg total) by mouth daily. *For reference purposes while preparing patient instructions.   Delete this med list prior to printing instructions for patient.*  There are no medications on your list that you need to hold.  On the morning of your procedure, take your Aspirin  325 mg and any morning medicines NOT listed above.  You may use sips of water.  6. Plan to go home the same day, you will only stay overnight if medically necessary. 7. Bring a current list of your medications and current insurance cards. 8. You MUST have a responsible person to drive you home. 9. Someone MUST be with you the first 24 hours after you arrive home or your discharge will be delayed. 10. Please wear clothes that are easy to get on and off and wear slip-on shoes.  Thank you for allowing us  to care for you!   -- [facility]   Follow-Up: At [facility], you and your health needs are our priority.  As part of our continuing mission to provide you with exceptional heart care, our providers are  all part of one team.  This team includes your primary Cardiologist (physician) and Advanced Practice Providers or APPs (Physician Assistants and Nurse Practitioners) who all work together to provide you with the care you need, when you need it.  Your next appointment:   2 week(s) After the procedure  Provider:   Deatrice Cage, MD or Lonni Meager, NP    We recommend signing up for the patient portal called MyChart.  Sign up information is provided on this After Visit Summary.  MyChart is used to connect with patients for Virtual Visits (Telemedicine).  Patients are able to view lab/test results, encounter notes, upcoming  appointments, etc.  Non-urgent messages can be sent to your provider as well.   To learn more about what you can do with MyChart, go to [email or web address].    Signed, Miriam FORBES Shams, NP  01/01/2024 5:08 PM    [facility]

## 2024-01-01 ENCOUNTER — Encounter: Payer: Self-pay | Admitting: Nurse Practitioner

## 2024-01-01 ENCOUNTER — Ambulatory Visit: Attending: Nurse Practitioner | Admitting: Emergency Medicine

## 2024-01-01 VITALS — BP 122/62 | HR 64 | Ht 71.0 in | Wt 203.4 lb

## 2024-01-01 DIAGNOSIS — Z79899 Other long term (current) drug therapy: Secondary | ICD-10-CM | POA: Diagnosis not present

## 2024-01-01 DIAGNOSIS — I25118 Atherosclerotic heart disease of native coronary artery with other forms of angina pectoris: Secondary | ICD-10-CM | POA: Diagnosis not present

## 2024-01-01 DIAGNOSIS — I25110 Atherosclerotic heart disease of native coronary artery with unstable angina pectoris: Secondary | ICD-10-CM | POA: Diagnosis not present

## 2024-01-01 DIAGNOSIS — Z951 Presence of aortocoronary bypass graft: Secondary | ICD-10-CM

## 2024-01-01 DIAGNOSIS — E785 Hyperlipidemia, unspecified: Secondary | ICD-10-CM | POA: Diagnosis not present

## 2024-01-01 DIAGNOSIS — R0602 Shortness of breath: Secondary | ICD-10-CM | POA: Diagnosis not present

## 2024-01-01 DIAGNOSIS — R0789 Other chest pain: Secondary | ICD-10-CM | POA: Diagnosis not present

## 2024-01-01 DIAGNOSIS — I10 Essential (primary) hypertension: Secondary | ICD-10-CM

## 2024-01-01 MED ORDER — NITROGLYCERIN 0.4 MG SL SUBL: 0.4000 mg | SUBLINGUAL_TABLET | SUBLINGUAL | 3 refills | Status: AC | PRN

## 2024-01-01 NOTE — Addendum Note (Signed)
 Addended by: Zigmond Trela E on: 01/01/2024 05:21 PM   Modules accepted: Orders

## 2024-01-01 NOTE — Patient Instructions (Addendum)
 Medication Instructions:   Your physician recommends the following medication changes.  START TAKING: Nitroglycerin  0.4 mg as needed for chest pain The proper use and anticipated side effects of nitroglycerine has been carefully explained.  If a single episode of chest pain is not relieved by one tablet, the patient will try another within 5 minutes; and if this doesn't relieve the pain, the patient is instructed to call 911 for transportation to an emergency department.   Your physician recommends that you continue on your current medications as directed. Please refer to the Current Medication list given to you today.   *If you need a refill on your cardiac medications before your next appointment, please call your pharmacy*  Lab Work: Your provider would like for you to have following labs drawn today BMP, CBC.   If you have labs (blood work) drawn today and your tests are completely normal, you will receive your results only by: MyChart Message (if you have MyChart) OR A paper copy in the mail If you have any lab test that is abnormal or we need to change your treatment, we will call you to review the results.  Testing/Procedures:  [place] A DEPT OF [place] AT [place] [address] [place] [address] Dept: [phone] Loc: [phone]  [place] Cu  01/01/2024  You are scheduled for a Cardiac Catheterization on Monday, October 6 with Dr. Deatrice Cage.  1. Please arrive at the [facility] Entrance of [facility], [address] [place], [place] [address] at 8:30 AM (This is 1 hour hour(s) prior to your procedure time).  Proceed to the Check-In Desk directly inside the entrance.  Procedure Parking: Use the entrance off of the [address] side of the hospital. Turn right upon entering and follow the driveway to parking that is directly in front of the Heart & Vascular Center. There  is no valet parking available at this entrance, however there is an awning directly in front of the Heart & Vascular Center for drop off/ pick up for patients.  Special note: Every effort is made to have your procedure done on time. Please understand that emergencies sometimes delay scheduled procedures.  2. Diet: NPO: Nothing to eat OR drink after midnight. (For TEE and Cath the same day)   3. Hydration: You need to be well hydrated before your procedure. On October 6, you may drink approved liquids (see below) until 2 hours before the procedure, with 16 oz of water as your last intake.   List of approved liquids water, clear juice, clear tea, black coffee, fruit juices, non-citric and without pulp, carbonated beverages, Gatorade, Kool -Aid, plain Jello-O and plain ice popsicles.  4. Labs: You will need to have blood drawn on Today at [facility] Entrance, Go to 1st desk on your right to register.  Address: [address] [place], [place] [address]  Open: 8am - 5pm  Phone: [phone]. You do not need to be fasting.  5. Medication instructions in preparation for your procedure:   Contrast Allergy: No    Current Outpatient Medications (Cardiovascular):    atorvastatin  (LIPITOR ) 80 MG tablet, TAKE 1 TABLET BY MOUTH EVERY DAY  Current Outpatient Medications (Respiratory):    albuterol  (VENTOLIN  HFA) 108 (90 Base) MCG/ACT inhaler, TAKE 2 PUFFS BY MOUTH EVERY 6 HOURS AS NEEDED FOR WHEEZE OR SHORTNESS OF BREATH   dextromethorphan-guaiFENesin  (MUCINEX  DM) 30-600 MG 12hr tablet, Take 1 tablet  by mouth 2 (two) times daily.   fluticasone  (FLONASE ) 50 MCG/ACT nasal spray, Place 2 sprays into both nostrils daily.   Fluticasone -Umeclidin-Vilant (TRELEGY ELLIPTA ) 100-62.5-25 MCG/ACT AEPB, Inhale 1 puff into the lungs daily.   Fluticasone -Umeclidin-Vilant (TRELEGY ELLIPTA ) 100-62.5-25 MCG/ACT AEPB, Inhale 1 puff into the lungs daily. (Patient not taking: Reported on 01/01/2024)  Current  Outpatient Medications (Analgesics):    aspirin  EC 325 MG EC tablet, Take 1 tablet (325 mg total) by mouth daily.   Current Outpatient Medications (Other):    Multiple Vitamin (MULTIVITAMIN) capsule, Take 1 capsule by mouth daily.   sertraline  (ZOLOFT ) 50 MG tablet, Take 1 tablet (50 mg total) by mouth daily. *For reference purposes while preparing patient instructions.   Delete this med list prior to printing instructions for patient.*  There are no medications on your list that you need to hold.  On the morning of your procedure, take your Aspirin  325 mg and any morning medicines NOT listed above.  You may use sips of water.  6. Plan to go home the same day, you will only stay overnight if medically necessary. 7. Bring a current list of your medications and current insurance cards. 8. You MUST have a responsible person to drive you home. 9. Someone MUST be with you the first 24 hours after you arrive home or your discharge will be delayed. 10. Please wear clothes that are easy to get on and off and wear slip-on shoes.  Thank you for allowing us  to care for you!   -- [place] [facility]   Follow-Up: At [facility], you and your health needs are our priority.  As part of our continuing mission to provide you with exceptional heart care, our providers are all part of one team.  This team includes your primary Cardiologist (physician) and Advanced Practice Providers or APPs (Physician Assistants and Nurse Practitioners) who all work together to provide you with the care you need, when you need it.  Your next appointment:   2 week(s) After the procedure  Provider:   Deatrice Cage, MD or Lonni Meager, NP    We recommend signing up for the patient portal called MyChart.  Sign up information is provided on this After Visit Summary.  MyChart is used to connect with patients for Virtual Visits (Telemedicine).  Patients are able to view lab/test  results, encounter notes, upcoming appointments, etc.  Non-urgent messages can be sent to your provider as well.   To learn more about what you can do with MyChart, go to [email or web address].

## 2024-01-02 ENCOUNTER — Ambulatory Visit: Payer: Self-pay | Admitting: Emergency Medicine

## 2024-01-02 LAB — CBC
Hematocrit: 48.9 % (ref 37.5–51.0)
Hemoglobin: 16.5 g/dL (ref 13.0–17.7)
MCH: 33.1 pg — ABNORMAL HIGH (ref 26.6–33.0)
MCHC: 33.7 g/dL (ref 31.5–35.7)
MCV: 98 fL — ABNORMAL HIGH (ref 79–97)
Platelets: 209 x10E3/uL (ref 150–450)
RBC: 4.98 x10E6/uL (ref 4.14–5.80)
RDW: 12.5 % (ref 11.6–15.4)
WBC: 10.3 x10E3/uL (ref 3.4–10.8)

## 2024-01-02 LAB — BASIC METABOLIC PANEL WITH GFR
BUN/Creatinine Ratio: 13 (ref 10–24)
BUN: 13 mg/dL (ref 8–27)
CO2: 23 mmol/L (ref 20–29)
Calcium: 9.7 mg/dL (ref 8.6–10.2)
Chloride: 102 mmol/L (ref 96–106)
Creatinine, Ser: 0.98 mg/dL (ref 0.76–1.27)
Glucose: 81 mg/dL (ref 70–99)
Potassium: 4.9 mmol/L (ref 3.5–5.2)
Sodium: 139 mmol/L (ref 134–144)
eGFR: 84 mL/min/1.73 (ref >59)

## 2024-01-05 ENCOUNTER — Other Ambulatory Visit: Payer: Self-pay

## 2024-01-05 ENCOUNTER — Encounter: Payer: Self-pay | Admitting: Cardiovascular Disease

## 2024-01-05 ENCOUNTER — Encounter
Admission: RE | Disposition: A | Discharge status: Home / Self Care | Payer: Self-pay | Source: Home / Self Care | Attending: Cardiovascular Disease

## 2024-01-05 ENCOUNTER — Ambulatory Visit
Admission: RE | Admit: 2024-01-05 | Discharge: 2024-01-05 | Disposition: A | Discharge status: Home / Self Care | Attending: Cardiovascular Disease | Admitting: Cardiovascular Disease

## 2024-01-05 DIAGNOSIS — I10 Essential (primary) hypertension: Secondary | ICD-10-CM | POA: Diagnosis not present

## 2024-01-05 DIAGNOSIS — R9439 Abnormal result of other cardiovascular function study: Secondary | ICD-10-CM | POA: Diagnosis not present

## 2024-01-05 DIAGNOSIS — Z7982 Long term (current) use of aspirin: Secondary | ICD-10-CM | POA: Insufficient documentation

## 2024-01-05 DIAGNOSIS — I2510 Atherosclerotic heart disease of native coronary artery without angina pectoris: Secondary | ICD-10-CM

## 2024-01-05 DIAGNOSIS — Z79899 Other long term (current) drug therapy: Secondary | ICD-10-CM | POA: Diagnosis not present

## 2024-01-05 DIAGNOSIS — E785 Hyperlipidemia, unspecified: Secondary | ICD-10-CM | POA: Insufficient documentation

## 2024-01-05 DIAGNOSIS — I25118 Atherosclerotic heart disease of native coronary artery with other forms of angina pectoris: Secondary | ICD-10-CM | POA: Insufficient documentation

## 2024-01-05 DIAGNOSIS — I25110 Atherosclerotic heart disease of native coronary artery with unstable angina pectoris: Secondary | ICD-10-CM

## 2024-01-05 DIAGNOSIS — Z87891 Personal history of nicotine dependence: Secondary | ICD-10-CM | POA: Diagnosis not present

## 2024-01-05 DIAGNOSIS — Z951 Presence of aortocoronary bypass graft: Secondary | ICD-10-CM | POA: Insufficient documentation

## 2024-01-05 DIAGNOSIS — R079 Chest pain, unspecified: Secondary | ICD-10-CM

## 2024-01-05 HISTORY — PX: LEFT HEART CATH AND CORS/GRAFTS ANGIOGRAPHY: CATH118250

## 2024-01-05 SURGERY — LEFT HEART CATH AND CORS/GRAFTS ANGIOGRAPHY
Anesthesia: Moderate Sedation | Laterality: Left

## 2024-01-05 MED ORDER — IOHEXOL 300 MG/ML  SOLN
INTRAMUSCULAR | Status: DC | PRN
  Administered 2024-01-05: 115 mL

## 2024-01-05 MED ORDER — ACETAMINOPHEN 325 MG PO TABS: 650.0000 mg | ORAL_TABLET | ORAL | Status: DC | PRN

## 2024-01-05 MED ORDER — ASPIRIN 81 MG PO CHEW
CHEWABLE_TABLET | ORAL | Status: AC
  Filled 2024-01-05: qty 1

## 2024-01-05 MED ORDER — SODIUM CHLORIDE 0.9% FLUSH: 3.0000 mL | Freq: Two times a day (BID) | INTRAVENOUS | Status: DC

## 2024-01-05 MED ORDER — SODIUM CHLORIDE 0.9 % IV SOLN: 250.0000 mL | INTRAVENOUS | Status: DC | PRN

## 2024-01-05 MED ORDER — HEPARIN SODIUM (PORCINE) 1000 UNIT/ML IJ SOLN
INTRAMUSCULAR | Status: AC
  Filled 2024-01-05: qty 10

## 2024-01-05 MED ORDER — VERAPAMIL HCL 2.5 MG/ML IV SOLN
INTRAVENOUS | Status: DC | PRN
  Administered 2024-01-05: 2.5 mg via INTRA_ARTERIAL

## 2024-01-05 MED ORDER — FENTANYL CITRATE (PF) 100 MCG/2ML IJ SOLN
INTRAMUSCULAR | Status: DC | PRN
  Administered 2024-01-05: 25 ug via INTRAVENOUS

## 2024-01-05 MED ORDER — FREE WATER
500.0000 mL | Freq: Once | Status: AC
  Administered 2024-01-05: 500 mL via ORAL

## 2024-01-05 MED ORDER — ASPIRIN 81 MG PO CHEW
81.0000 mg | CHEWABLE_TABLET | ORAL | Status: AC
  Administered 2024-01-05: 81 mg via ORAL

## 2024-01-05 MED ORDER — SODIUM CHLORIDE 0.9% FLUSH: 3.0000 mL | INTRAVENOUS | Status: DC | PRN

## 2024-01-05 MED ORDER — HEPARIN SODIUM (PORCINE) 1000 UNIT/ML IJ SOLN
INTRAMUSCULAR | Status: DC | PRN
  Administered 2024-01-05: 5000 [IU] via INTRAVENOUS

## 2024-01-05 MED ORDER — MIDAZOLAM HCL 2 MG/2ML IJ SOLN
INTRAMUSCULAR | Status: DC | PRN
  Administered 2024-01-05: 1 mg via INTRAVENOUS

## 2024-01-05 MED ORDER — FENTANYL CITRATE (PF) 100 MCG/2ML IJ SOLN
INTRAMUSCULAR | Status: AC
  Filled 2024-01-05: qty 2

## 2024-01-05 MED ORDER — HEPARIN (PORCINE) IN NACL 1000-0.9 UT/500ML-% IV SOLN
INTRAVENOUS | Status: DC | PRN
  Administered 2024-01-05 (×2): 500 mL

## 2024-01-05 MED ORDER — VERAPAMIL HCL 2.5 MG/ML IV SOLN
INTRAVENOUS | Status: AC
  Filled 2024-01-05: qty 2

## 2024-01-05 MED ORDER — ONDANSETRON HCL 4 MG/2ML IJ SOLN: 4.0000 mg | Freq: Four times a day (QID) | INTRAMUSCULAR | Status: DC | PRN

## 2024-01-05 MED ORDER — MIDAZOLAM HCL 2 MG/2ML IJ SOLN
INTRAMUSCULAR | Status: AC
  Filled 2024-01-05: qty 2

## 2024-01-05 MED ORDER — LIDOCAINE HCL 1 % IJ SOLN
INTRAMUSCULAR | Status: AC
  Filled 2024-01-05: qty 20

## 2024-01-05 SURGICAL SUPPLY — 10 items
CATH 5F 110X4 TIG (CATHETERS) IMPLANT
CATH INFINITI JR4 5F (CATHETERS) IMPLANT
DEVICE RAD TR BAND REGULAR (VASCULAR PRODUCTS) IMPLANT
DRAPE BRACHIAL (DRAPES) IMPLANT
GLIDESHEATH SLEND SS 6F .021 (SHEATH) IMPLANT
GUIDEWIRE INQWIRE 1.5J.035X260 (WIRE) IMPLANT
PACK CARDIAC CATH (CUSTOM PROCEDURE TRAY) ×1 IMPLANT
PAD ELECT DEFIB RADIOL ZOLL (MISCELLANEOUS) IMPLANT
SET ATX-X65L (MISCELLANEOUS) IMPLANT
STATION PROTECTION PRESSURIZED (MISCELLANEOUS) IMPLANT

## 2024-01-05 NOTE — Interval H&P Note (Signed)
 History and Physical Interval Note:  01/05/2024 9:38 AM  Hayden Deleon  has presented today for surgery, with the diagnosis of L Cath w graft    Chest pain.  The various methods of treatment have been discussed with the patient and family. After consideration of risks, benefits and other options for treatment, the patient has consented to  Procedure(s): LEFT HEART CATH AND CORS/GRAFTS ANGIOGRAPHY (Left) as a surgical intervention.  The patient's history has been reviewed, patient examined, no change in status, stable for surgery.  I have reviewed the patient's chart and labs.  Questions were answered to the patient's satisfaction.     Sabella Traore

## 2024-01-13 ENCOUNTER — Other Ambulatory Visit

## 2024-01-13 ENCOUNTER — Encounter

## 2024-01-22 ENCOUNTER — Ambulatory Visit: Attending: Nurse Practitioner | Admitting: Nurse Practitioner

## 2024-01-22 ENCOUNTER — Encounter: Payer: Self-pay | Admitting: Nurse Practitioner

## 2024-01-22 VITALS — BP 160/82 | HR 61 | Ht 71.0 in | Wt 204.4 lb

## 2024-01-22 DIAGNOSIS — I25118 Atherosclerotic heart disease of native coronary artery with other forms of angina pectoris: Secondary | ICD-10-CM

## 2024-01-22 DIAGNOSIS — I6523 Occlusion and stenosis of bilateral carotid arteries: Secondary | ICD-10-CM

## 2024-01-22 DIAGNOSIS — I10 Essential (primary) hypertension: Secondary | ICD-10-CM

## 2024-01-22 DIAGNOSIS — E785 Hyperlipidemia, unspecified: Secondary | ICD-10-CM

## 2024-01-22 MED ORDER — AMLODIPINE BESYLATE 2.5 MG PO TABS: 2.5000 mg | ORAL_TABLET | Freq: Every day | ORAL | 1 refills | Status: AC

## 2024-01-22 NOTE — Patient Instructions (Signed)
 Medication Instructions:  START Amlodipine  2.5 mg once daily  *If you need a refill on your cardiac medications before your next appointment, please call your pharmacy*  Lab Work: None ordered If you have labs (blood work) drawn today and your tests are completely normal, you will receive your results only by: MyChart Message (if you have MyChart) OR A paper copy in the mail If you have any lab test that is abnormal or we need to change your treatment, we will call you to review the results.  Testing/Procedures: Your physician has requested that you have a carotid duplex. This test is an ultrasound of the carotid arteries in your neck. It looks at blood flow through these arteries that supply the brain with blood.   Allow one hour for this exam.  There are no restrictions or special instructions.  This will take place at [address] [facility]) [address], [place] [address]  Please note: We ask at that you not bring children with you during ultrasound (echo/ vascular) testing. Due to room size and safety concerns, children are not allowed in the ultrasound rooms during exams. Our front office staff cannot provide observation of children in our lobby area while testing is being conducted. An adult accompanying a patient to their appointment will only be allowed in the ultrasound room at the discretion of the ultrasound technician under special circumstances. We apologize for any inconvenience.   Follow-Up: At [facility], you and your health needs are our priority.  As part of our continuing mission to provide you with exceptional heart care, our providers are all part of one team.  This team includes your primary Cardiologist (physician) and Advanced Practice Providers or APPs (Physician Assistants and Nurse Practitioners) who all work together to provide you with the care you need, when you need it.  Your next appointment:   3 month(s)  Provider:   Deatrice Cage, MD or Lonni Meager, NP    We recommend signing up for the patient portal called MyChart.  Sign up information is provided on this After Visit Summary.  MyChart is used to connect with patients for Virtual Visits (Telemedicine).  Patients are able to view lab/test results, encounter notes, upcoming appointments, etc.  Non-urgent messages can be sent to your provider as well.   To learn more about what you can do with MyChart, go to [email or web address].

## 2024-01-22 NOTE — Progress Notes (Signed)
 Office Visit    Patient Name: Hayden Deleon Date of Encounter: 01/22/2024  Primary Care Provider:  Berneta Elsie Sayre, MD Primary Cardiologist:  Deatrice Cage, MD    Chief Complaint    68 y.o. male with a history of CAD status post CABG x3 in August 2022, hypertension, hyperlipidemia, COPD, sleep apnea, aortic dilatation, and carotid arterial disease, who presents for follow-up after recent diagnostic catheterization.  Past Medical History   Subjective   Past Medical History:  Diagnosis Date   Allergy    Anxiety    Arthritis    right shoulder   CAD (coronary artery disease)    a. 2003 Cath: OM1 100%, good collats, RCA 60-70p-->med rx; b. 04/2020 MV: mild ischemia to sm area of mid inflat & apical lat segments. EF >65%; d. 10/2020 Cath: Sev LM/OM1/RCA dzs; e. 10/2020 CABG x 3: LIMA->LAD, VG->RPDA, VG->OM2; f. 12/2023 Cath: LM 85ost, LAD 30p/m, OM1 100, RCA 100, VG->OM3 nl, LIMA->LAD nl, VG->RPDA nl-->Med rx.   Carotid artery disease    a. 03/2020 Carotid U/S: RICA 1-39%, LICA 40-59%; b. 01/2021 U/S: 40-59% bilat ICA stenoses.   COPD (chronic obstructive pulmonary disease) (HCC)    Significant Dr. Shelah   Diastolic dysfunction    a. 03/2018 Echo: EF 60-65%, no rwma, Gr1 DD. Mildly dil LA. Nl RV fxn.   Diastolic dysfunction    a. 01/2022 Echo: EF 70-75%, GrI DD, nl RV size/fxn, mildly dil RA, triv MR.   Dilation of ascending aorta and aortic root (HCC)    a. 10/2020 Echo: Asc Ao 40mm; b. 01/2022 Echo: Nl Ao size.   Diverticulosis of colon    Emphysema of lung (HCC)    GERD (gastroesophageal reflux disease)    Gout    Hyperlipidemia    Hypertension    LVH (left ventricular hypertrophy)    a. 10/2020 Echo: EF 70-75%, no rwma, mod LVH. Nl RV size/fxn. Mildly dil RA. Triv MR. Asc Ao 40mm.   Prostatitis    Sleep apnea    uses cpap nightly   Past Surgical History:  Procedure Laterality Date   COLONOSCOPY  12/2003   patterson - polyp   CORONARY ARTERY BYPASS GRAFT  N/A 11/05/2020   Procedure: CORONARY ARTERY BYPASS GRAFTING (CABG) TIMES 3, USING LEFT INTERNAL MAMMARY ARTERY AND GREATER SAPHEOUS VEIN HARVESTED ENDOSCOPICALLY LEFT AND RIGHT LEGS;  Surgeon: Shyrl Linnie KIDD, MD;  Location: [facility] OR;  Service: Open Heart Surgery;  Laterality: N/A;   deviated septum     JOINT REPLACEMENT     KNEE ARTHROSCOPY     right   LEFT HEART CATH AND CORONARY ANGIOGRAPHY N/A 11/03/2020   Procedure: LEFT HEART CATH AND CORONARY ANGIOGRAPHY;  Surgeon: Cage Deatrice LABOR, MD;  Location: [facility] INVASIVE CV LAB;  Service: Cardiovascular;  Laterality: N/A;   LEFT HEART CATH AND CORS/GRAFTS ANGIOGRAPHY Left 01/05/2024   Procedure: LEFT HEART CATH AND CORS/GRAFTS ANGIOGRAPHY;  Surgeon: Cage Deatrice LABOR, MD;  Location: [facility] INVASIVE CV LAB;  Service: Cardiovascular;  Laterality: Left;   TOTAL SHOULDER ARTHROPLASTY Right 02/03/2018   Procedure: RIGHT REVERSE SHOULDER ARTHROPLASTY;  Surgeon: Addie Cordella Hamilton, MD;  Location: [facility] OR;  Service: Orthopedics;  Laterality: Right;   WISDOM TOOTH EXTRACTION      Allergies  Allergies  Allergen Reactions   Avelox  [Moxifloxacin  Hcl In Nacl] Other (See Comments)    Facial swelling   Sulfamethoxazole-Trimethoprim Other (See Comments)    REACTION: sore mouth   Imdur  [Isosorbide  Nitrate] Other (See Comments)  Made pt feel groggy, loopy, bad dreams        History of Present Illness      68 y.o. y/o male with a history of CAD status post CABG x3 in August 2022, hypertension, hyperlipidemia, COPD, sleep apnea, aortic dilatation, and carotid arterial disease.  Diagnostic catheterization 2003 showed an occluded OM1 with collaterals, as well as a 60 to 70% proximal RCA stenosis.  EF was normal.  He was medically managed.  He had a prolonged history of stable angina with mild chest pain with overexertion.  Stress testing in January 2022 showed a small area of mild ischemia in the inferolateral and apical lateral segments.  EF was 65%.  CT  imaging showed coronary calcifications and aortic atherosclerosis.  He was also noted to have bilateral ground glass opacities more focal consolidation versus scarring in the left lower lobe.  Due to progressive symptoms, he underwent diagnostic catheterization in August 2022 showing an 85% ostial left main stenosis, occluded OM1, 99% proximal and mid RCA stenoses.  He was transferred to [facility] underwent CABG x 3 with a LIMA to the LAD, vein graft to the PDA, and vein graft to the OM 2.  In the setting of unstable angina, he underwent Lexiscan  PET stress testing in September 2025 showing baseline anterior, anterolateral, and anteroseptal reversible ischemia with normal LV function.  He was subsequently seen back in clinic in underwent diagnostic catheterization in early October 2025 showing severe native multivessel coronary artery disease with 3 of 3 patent grafts, and medical therapy was advised.      Since his catheterization, he has felt generally better.  He has increased his activity, now exercising 2 to 3 days/week for about 45 minutes on a treadmill.  He has had some exertional chest discomfort, usually about once a week or so, that causes him to either slow his pace or briefly stop walking.  Symptoms last about a min and resolve with rest.  He has not had any resting symptoms.  He denies palpitations, dyspnea, pnd, orthopnea, n, v, dizziness, syncope, edema, weight gain, or early satiety.  Objective   Home Medications    Current Outpatient Medications  Medication Sig Dispense Refill   albuterol  (VENTOLIN  HFA) 108 (90 Base) MCG/ACT inhaler TAKE 2 PUFFS BY MOUTH EVERY 6 HOURS AS NEEDED FOR WHEEZE OR SHORTNESS OF BREATH 6.7 each 2   aspirin  EC 325 MG EC tablet Take 1 tablet (325 mg total) by mouth daily. 30 tablet 0   atorvastatin  (LIPITOR ) 80 MG tablet TAKE 1 TABLET BY MOUTH EVERY DAY 90 tablet 3   dextromethorphan-guaiFENesin  (MUCINEX  DM) 30-600 MG 12hr tablet Take 1 tablet by mouth 2  (two) times daily.     fluticasone  (FLONASE ) 50 MCG/ACT nasal spray Place 2 sprays into both nostrils daily. 16 g 6   Fluticasone -Umeclidin-Vilant (TRELEGY ELLIPTA ) 100-62.5-25 MCG/ACT AEPB Inhale 1 puff into the lungs daily. 180 each 3   Multiple Vitamin (MULTIVITAMIN) capsule Take 1 capsule by mouth daily.     nitroGLYCERIN  (NITROSTAT ) 0.4 MG SL tablet Place 1 tablet (0.4 mg total) under the tongue every 5 (five) minutes as needed for chest pain. 25 tablet 3   sertraline  (ZOLOFT ) 50 MG tablet Take 1 tablet (50 mg total) by mouth daily. 90 tablet 1   No current facility-administered medications for this visit.     Physical Exam    VS:  BP (!) 154/70 (BP Location: Left Arm, Patient Position: Sitting, Cuff Size: Large)   Pulse 61  Ht 5' 11 (1.803 m)   Wt 204 lb 6 oz (92.7 kg)   SpO2 96%   BMI 28.50 kg/m  , BMI Body mass index is 28.5 kg/m.    Vitals:   01/22/24 1500 01/22/24 1635  BP: (!) 154/70 (!) 160/82  Pulse: 61   SpO2: 96%           GEN: Well nourished, well developed, in no acute distress. HEENT: normal. Neck: Supple, no JVD, carotid bruits, or masses. Cardiac: RRR, no murmurs, rubs, or gallops. No clubbing, cyanosis, edema.  Radials 2+/PT 2+ and equal bilaterally.  L radial cath site w/o bleeding/bruit/hematoma. Respiratory:  Respirations regular and unlabored, clear to auscultation bilaterally. GI: Soft, nontender, nondistended, BS + x 4. MS: no deformity or atrophy. Skin: warm and dry, no rash. Neuro:  Strength and sensation are intact. Psych: Normal affect.  Accessory Clinical Findings    ECG personally reviewed by me today - EKG Interpretation Date/Time:  Thursday January 22 2024 15:03:47 EDT Ventricular Rate:  61 PR Interval:  190 QRS Duration:  96 QT Interval:  476 QTC Calculation: 479 R Axis:   -13  Text Interpretation: Normal sinus rhythm Normal ECG Baseline wander Confirmed by Vivienne Bruckner [id number]) on 01/22/2024 3:11:57 PM  - no acute  changes.  Lab Results  Component Value Date   WBC 10.3 01/01/2024   HGB 16.5 01/01/2024   HCT 48.9 01/01/2024   MCV 98 (H) 01/01/2024   PLT 209 01/01/2024   Lab Results  Component Value Date   CREATININE 0.98 01/01/2024   BUN 13 01/01/2024   NA 139 01/01/2024   K 4.9 01/01/2024   CL 102 01/01/2024   CO2 23 01/01/2024   Lab Results  Component Value Date   ALT 19 08/22/2023   AST 19 08/22/2023   ALKPHOS 67 08/22/2023   BILITOT 1.1 08/22/2023   Lab Results  Component Value Date   CHOL 136 08/22/2023   HDL 53.00 08/22/2023   LDLCALC 58 08/22/2023   LDLDIRECT 71.0 01/03/2016   TRIG 122.0 08/22/2023   CHOLHDL 3 08/22/2023    Lab Results  Component Value Date   HGBA1C 6.0 08/22/2023   Lab Results  Component Value Date   TSH 1.36 12/16/2019       Assessment & Plan    1.  CAD: s/p prior CABG x 3 in 2022 w/ recent abnl stress test followed by diagnostic catheterization on October 6 showed 3 of 3 patent grafts with known multivessel disease.  Medical therapy was recommended.  Since his cath, he has noted occas exertional c/p when exercising but overall, his exercise tolerance has improved.  He is hypertensive today.  I'm adding amlodipine  2.5 mg daily.  Continue aspirin  and statin therapy.  Pending response to amlodipine , could consider further titration versus addition of ranolazine.  Patient does not tolerate long-acting nitrates.  2.  Primary hypertension: Blood pressure elevated today.  Adding amlodipine  2.5 mg daily.  Patient will follow blood pressure at home.  3.  Hyperlipidemia: LDL of 58 in May 2025 with normal LFTs at that time.  Continue high potency statin therapy.  4.  Carotid arterial disease: Most recent carotid ultrasound and December 2024 showed stable, 40 to 59% bilateral internal carotid artery stenoses with greater than 50% bilaterally external carotid artery stenoses.  Order is already in place for repeat carotid ultrasound and we will get this scheduled  for him for December or January.  5.  Aortic dilatation: 40 mm ascending aorta  on August 2022 echocardiogram.  Aorta was felt to be normal in size on November 2023 echo.  6.  Obstructive sleep apnea: Compliant with CPAP.  7.  Disposition: Follow-up carotid ultrasound later this year.  Follow-up in clinic in approximately 3 months or sooner if necessary.  Lonni Meager, NP 01/22/2024, 3:12 PM

## 2024-01-27 ENCOUNTER — Ambulatory Visit: Admitting: Cardiovascular Disease

## 2024-01-29 ENCOUNTER — Ambulatory Visit: Admitting: Nurse Practitioner

## 2024-02-02 ENCOUNTER — Encounter: Payer: Self-pay | Admitting: Radiology

## 2024-02-03 ENCOUNTER — Encounter

## 2024-02-03 ENCOUNTER — Other Ambulatory Visit

## 2024-02-05 ENCOUNTER — Ambulatory Visit (INDEPENDENT_AMBULATORY_CARE_PROVIDER_SITE_OTHER)

## 2024-02-05 ENCOUNTER — Ambulatory Visit: Attending: Cardiovascular Disease

## 2024-02-05 DIAGNOSIS — I10 Essential (primary) hypertension: Secondary | ICD-10-CM | POA: Diagnosis not present

## 2024-02-05 DIAGNOSIS — I2510 Atherosclerotic heart disease of native coronary artery without angina pectoris: Secondary | ICD-10-CM

## 2024-02-05 DIAGNOSIS — Z136 Encounter for screening for cardiovascular disorders: Secondary | ICD-10-CM

## 2024-02-05 DIAGNOSIS — E785 Hyperlipidemia, unspecified: Secondary | ICD-10-CM

## 2024-02-05 DIAGNOSIS — M79606 Pain in leg, unspecified: Secondary | ICD-10-CM

## 2024-02-05 LAB — VAS US ABI WITH/WO TBI
Left ABI: 1.28
Right ABI: 1.18

## 2024-03-01 ENCOUNTER — Telehealth: Admitting: Emergency Medicine

## 2024-03-01 ENCOUNTER — Encounter: Payer: Self-pay | Admitting: Emergency Medicine

## 2024-03-01 ENCOUNTER — Ambulatory Visit (INDEPENDENT_AMBULATORY_CARE_PROVIDER_SITE_OTHER)

## 2024-03-01 ENCOUNTER — Ambulatory Visit: Admitting: Emergency Medicine

## 2024-03-01 VITALS — BP 140/78 | HR 75 | Temp 98.6°F | Resp 18 | Ht 71.0 in | Wt 199.0 lb

## 2024-03-01 DIAGNOSIS — R509 Fever, unspecified: Secondary | ICD-10-CM

## 2024-03-01 DIAGNOSIS — R918 Other nonspecific abnormal finding of lung field: Secondary | ICD-10-CM | POA: Diagnosis not present

## 2024-03-01 DIAGNOSIS — R059 Cough, unspecified: Secondary | ICD-10-CM

## 2024-03-01 DIAGNOSIS — J189 Pneumonia, unspecified organism: Secondary | ICD-10-CM | POA: Diagnosis not present

## 2024-03-01 DIAGNOSIS — J449 Chronic obstructive pulmonary disease, unspecified: Secondary | ICD-10-CM | POA: Diagnosis not present

## 2024-03-01 DIAGNOSIS — Z96611 Presence of right artificial shoulder joint: Secondary | ICD-10-CM | POA: Diagnosis not present

## 2024-03-01 DIAGNOSIS — R0602 Shortness of breath: Secondary | ICD-10-CM | POA: Diagnosis not present

## 2024-03-01 LAB — POC COVID19 BINAXNOW: SARS Coronavirus 2 Ag: NEGATIVE

## 2024-03-01 LAB — POCT INFLUENZA A/B
Influenza A, POC: NEGATIVE
Influenza B, POC: NEGATIVE

## 2024-03-01 LAB — POCT RAPID STREP A (OFFICE): Rapid Strep A Screen: NEGATIVE

## 2024-03-01 MED ORDER — AZITHROMYCIN 250 MG PO TABS: ORAL_TABLET | ORAL | 0 refills | Status: AC

## 2024-03-01 MED ORDER — PREDNISONE 20 MG PO TABS: 40.0000 mg | ORAL_TABLET | Freq: Every day | ORAL | 0 refills | Status: AC

## 2024-03-01 MED ORDER — AMOXICILLIN-POT CLAVULANATE 875-125 MG PO TABS: 1.0000 | ORAL_TABLET | Freq: Two times a day (BID) | ORAL | 0 refills | Status: AC

## 2024-03-01 NOTE — Progress Notes (Signed)
 Assessment & Plan:   Assessment & Plan Community acquired pneumonia of left lung, unspecified part of lung Hazy left opacity as compared to prior chest x-ray, suggestive of PNA which fits clinical picture. Pending rad red will call if differs. No resp distress. Baseline O2 in clinic currently. Double coverage given PMH.  ED if sudden breathing worsening.  Orders:   azithromycin  (ZITHROMAX ) 250 MG tablet; Take 2 tablets on day 1, then 1 tablet daily on days 2 through 5   amoxicillin -clavulanate (AUGMENTIN) 875-125 MG tablet; Take 1 tablet by mouth 2 (two) times daily for 7 days.   predniSONE  (DELTASONE ) 20 MG tablet; Take 2 tablets (40 mg total) by mouth daily for 5 days.  Fever, unspecified fever cause Neg strep, flu, COVID testing Orders:   POCT rapid strep A   POC COVID-19 BinaxNow   POCT Influenza A/B   DG Chest 2 View; Future       Results for orders placed or performed in visit on 03/01/24  POCT rapid strep A  Result Value Ref Range   Rapid Strep A Screen Negative Negative  POC COVID-19 BinaxNow  Result Value Ref Range   SARS Coronavirus 2 Ag Negative Negative  POCT Influenza A/B  Result Value Ref Range   Influenza A, POC Negative Negative   Influenza B, POC Negative Negative    Patient Instructions  Start both antibiotics today Add prednisone  if you get more wheezing or any increased difficulty breathing For uncontrolled symptoms, especially severe shortness of breath- go to the ER.    Corean Geralds, MSPAS, PA-C   Subjective:  HPI: Hayden Deleon is a 68 y.o. male with history of  has a past medical history of Allergy, Anxiety, Arthritis, CAD (coronary artery disease), Carotid artery disease, COPD (chronic obstructive pulmonary disease) (HCC), Diastolic dysfunction, Diastolic dysfunction, Dilation of ascending aorta and aortic root (HCC), Diverticulosis of colon, Emphysema of lung (HCC), GERD (gastroesophageal reflux disease), Gout, Hyperlipidemia,  Hypertension, LVH (left ventricular hypertrophy), Prostatitis, and Sleep apnea. presenting on 03/01/2024 for Fever (Pt had fever of 101 yesterday and the night before. Wife had strep throat last week. Pt is requesting a strep test since being near his wife. Denies sore throat. )  2 days 101 fever at night, with increased cough and WOB when this happens. No real increase in baseline mucous. O2 dropping into upper 70s, with his normal being in the 80s with his COPD baseline. Mild runny nose, but this is common for him. Denies myalgias, SOB, severe HA, neck stiffness, rash, abdominal pain, dysuria, joint swelling or pain.  Wife had strep last week.   ROS: Negative unless specifically indicated above in HPI.   Relevant past medical history reviewed and updated as indicated.   Allergies and medications reviewed and updated.   Current Outpatient Medications:    albuterol  (VENTOLIN  HFA) 108 (90 Base) MCG/ACT inhaler, TAKE 2 PUFFS BY MOUTH EVERY 6 HOURS AS NEEDED FOR WHEEZE OR SHORTNESS OF BREATH, Disp: 6.7 each, Rfl: 2   amLODipine  (NORVASC ) 2.5 MG tablet, Take 1 tablet (2.5 mg total) by mouth daily., Disp: 90 tablet, Rfl: 1   amoxicillin -clavulanate (AUGMENTIN) 875-125 MG tablet, Take 1 tablet by mouth 2 (two) times daily for 7 days., Disp: 14 tablet, Rfl: 0   aspirin  EC 325 MG EC tablet, Take 1 tablet (325 mg total) by mouth daily., Disp: 30 tablet, Rfl: 0   atorvastatin  (LIPITOR ) 80 MG tablet, TAKE 1 TABLET BY MOUTH EVERY DAY, Disp: 90 tablet, Rfl: 3  azithromycin  (ZITHROMAX ) 250 MG tablet, Take 2 tablets on day 1, then 1 tablet daily on days 2 through 5, Disp: 6 tablet, Rfl: 0   Fluticasone -Umeclidin-Vilant (TRELEGY ELLIPTA ) 100-62.5-25 MCG/ACT AEPB, Inhale 1 puff into the lungs daily., Disp: 180 each, Rfl: 3   Multiple Vitamin (MULTIVITAMIN) capsule, Take 1 capsule by mouth daily., Disp: , Rfl:    nitroGLYCERIN  (NITROSTAT ) 0.4 MG SL tablet, Place 1 tablet (0.4 mg total) under the tongue every 5  (five) minutes as needed for chest pain., Disp: 25 tablet, Rfl: 3   predniSONE  (DELTASONE ) 20 MG tablet, Take 2 tablets (40 mg total) by mouth daily for 5 days., Disp: 10 tablet, Rfl: 0   sertraline  (ZOLOFT ) 50 MG tablet, Take 1 tablet (50 mg total) by mouth daily., Disp: 90 tablet, Rfl: 1   dextromethorphan-guaiFENesin  (MUCINEX  DM) 30-600 MG 12hr tablet, Take 1 tablet by mouth 2 (two) times daily. (Patient not taking: Reported on 03/01/2024), Disp: , Rfl:    fluticasone  (FLONASE ) 50 MCG/ACT nasal spray, Place 2 sprays into both nostrils daily. (Patient not taking: Reported on 03/01/2024), Disp: 16 g, Rfl: 6  Allergies  Allergen Reactions   Avelox  [Moxifloxacin  Hcl In Nacl] Other (See Comments)    Facial swelling   Sulfamethoxazole-Trimethoprim Other (See Comments)    REACTION: sore mouth   Imdur  [Isosorbide  Nitrate] Other (See Comments)    Made pt feel groggy, loopy, bad dreams       Objective:   Vitals:   03/01/24 1118 03/01/24 1137  BP: (!) 142/84 (!) 140/78  Pulse: 75   Temp: 98.6 F (37 C)   Resp: 18   Height: 5' 11 (1.803 m)   Weight: 199 lb (90.3 kg)   SpO2: 92%   BMI (Calculated): 27.77       Gen: appears well, alert, INAD Ears: Right canal normal. Right TM normal landmarks and mobility. Left canal normal. Left TM normal landmarks and mobility.  Nose: normal and patent, no erythema, discharge or polyps Mouth: Oral mucosa moist. Throat: cobblestoned  Neck: supple and no adenopathy Heart RRR Lungs: Respiratory effort: normal. O2 at baseline/above baseline per patient report.   Coarse left sided mid and lower breath sounds. Speaking in sentences without difficulty Skin: Warm and dry without acute rash to exposed areas.

## 2024-03-01 NOTE — Patient Instructions (Signed)
 Start both antibiotics today Add prednisone  if you get more wheezing or any increased difficulty breathing For uncontrolled symptoms, especially severe shortness of breath- go to the ER.

## 2024-03-01 NOTE — Progress Notes (Signed)
 Audio on pt's side not working. He was not able to connect during the 15 min appt slot.   Jon Belt, PhD, FNP-BC  [employer] Phone: [phone] 03/01/2024 8:59 AM

## 2024-03-02 ENCOUNTER — Ambulatory Visit: Payer: Self-pay | Admitting: Emergency Medicine

## 2024-03-21 DIAGNOSIS — G4733 Obstructive sleep apnea (adult) (pediatric): Secondary | ICD-10-CM | POA: Diagnosis not present

## 2024-04-08 ENCOUNTER — Other Ambulatory Visit: Payer: Self-pay | Admitting: Internal Medicine

## 2024-04-08 DIAGNOSIS — J4489 Other specified chronic obstructive pulmonary disease: Secondary | ICD-10-CM

## 2024-04-12 ENCOUNTER — Ambulatory Visit (HOSPITAL_COMMUNITY)
Admission: RE | Admit: 2024-04-12 | Discharge: 2024-04-12 | Disposition: A | Discharge status: Home / Self Care | Source: Ambulatory Visit | Attending: Cardiovascular Disease | Admitting: Cardiovascular Disease

## 2024-04-12 DIAGNOSIS — I6523 Occlusion and stenosis of bilateral carotid arteries: Secondary | ICD-10-CM | POA: Insufficient documentation

## 2024-04-16 ENCOUNTER — Ambulatory Visit: Payer: Self-pay | Admitting: Cardiovascular Disease

## 2024-04-16 DIAGNOSIS — I6523 Occlusion and stenosis of bilateral carotid arteries: Secondary | ICD-10-CM

## 2024-04-19 NOTE — Telephone Encounter (Signed)
 Pt returning call. Please advise.

## 2024-04-23 ENCOUNTER — Other Ambulatory Visit: Payer: Self-pay | Admitting: Emergency Medicine

## 2024-04-23 ENCOUNTER — Ambulatory Visit: Admitting: Nurse Practitioner

## 2024-04-23 DIAGNOSIS — J189 Pneumonia, unspecified organism: Secondary | ICD-10-CM

## 2024-04-23 NOTE — Telephone Encounter (Signed)
 SABRA

## 2024-04-23 NOTE — Telephone Encounter (Signed)
 I called patient and no answer and left voicemail for patient to call the office to schedule an appointment.

## 2024-04-26 ENCOUNTER — Encounter (INDEPENDENT_AMBULATORY_CARE_PROVIDER_SITE_OTHER): Payer: Self-pay | Admitting: Vascular Surgery

## 2024-04-29 ENCOUNTER — Ambulatory Visit: Admitting: Nurse Practitioner

## 2024-04-29 ENCOUNTER — Encounter: Payer: Self-pay | Admitting: Nurse Practitioner

## 2024-04-29 VITALS — BP 136/70 | HR 73 | Ht 71.0 in | Wt 199.6 lb

## 2024-04-29 DIAGNOSIS — I25118 Atherosclerotic heart disease of native coronary artery with other forms of angina pectoris: Secondary | ICD-10-CM

## 2024-04-29 DIAGNOSIS — E785 Hyperlipidemia, unspecified: Secondary | ICD-10-CM | POA: Diagnosis not present

## 2024-04-29 DIAGNOSIS — I10 Essential (primary) hypertension: Secondary | ICD-10-CM | POA: Diagnosis not present

## 2024-04-29 DIAGNOSIS — G4733 Obstructive sleep apnea (adult) (pediatric): Secondary | ICD-10-CM | POA: Diagnosis not present

## 2024-04-29 DIAGNOSIS — I6523 Occlusion and stenosis of bilateral carotid arteries: Secondary | ICD-10-CM | POA: Diagnosis not present

## 2024-04-29 NOTE — Progress Notes (Signed)
 "    Office Visit    Patient Name: Hayden Deleon Date of Encounter: 04/29/2024  Primary Care Provider:  Berneta Elsie Sayre, MD Primary Cardiologist:  Hayden Cage, MD    Chief Complaint    69 y.o. male with a history of CAD status post CABG x3 in August 2022, hypertension, hyperlipidemia, COPD, sleep apnea, aortic dilatation, and carotid arterial disease, who presents for follow-up related to CAD and progressive carotid disease.  Past Medical History   Subjective   Past Medical History:  Diagnosis Date   Allergy    Anxiety    Arthritis    right shoulder   CAD (coronary artery disease)    a. 2003 Cath: OM1 100%, good collats, RCA 60-70p-->med rx; b. 04/2020 MV: mild ischemia to sm area of mid inflat & apical lat segments. EF >65%; d. 10/2020 Cath: Sev LM/OM1/RCA dzs; e. 10/2020 CABG x 3: LIMA->LAD, VG->RPDA, VG->OM2; f. 12/2023 Cath: LM 85ost, LAD 30p/m, OM1 100, RCA 100, VG->OM3 nl, LIMA->LAD nl, VG->RPDA nl-->Med rx.   Carotid artery disease    a. 03/2020 Carotid U/S: RICA 1-39%, LICA 40-59%; b. 01/2021 U/S: 40-59% bilat ICA stenoses; c. 04/2024 U/S: RICA 80-99%, LICA 40-59%.   COPD (chronic obstructive pulmonary disease) (HCC)    Significant Dr. Shelah   Diastolic dysfunction    a. 03/2018 Echo: EF 60-65%, no rwma, Gr1 DD. Mildly dil LA. Nl RV fxn.   Diastolic dysfunction    a. 01/2022 Echo: EF 70-75%, GrI DD, nl RV size/fxn, mildly dil RA, triv MR.   Dilation of ascending aorta and aortic root (HCC)    a. 10/2020 Echo: Asc Ao 40mm; b. 01/2022 Echo: Nl Ao size.   Diverticulosis of colon    Emphysema of lung (HCC)    GERD (gastroesophageal reflux disease)    Gout    Hyperlipidemia    Hypertension    LVH (left ventricular hypertrophy)    a. 10/2020 Echo: EF 70-75%, no rwma, mod LVH. Nl RV size/fxn. Mildly dil RA. Triv MR. Asc Ao 40mm.   Prostatitis    Sleep apnea    uses cpap nightly   Past Surgical History:  Procedure Laterality Date   COLONOSCOPY  12/2003    [facility] - polyp   CORONARY ARTERY BYPASS GRAFT N/A 11/05/2020   Procedure: CORONARY ARTERY BYPASS GRAFTING (CABG) TIMES 3, USING LEFT INTERNAL MAMMARY ARTERY AND GREATER SAPHEOUS VEIN HARVESTED ENDOSCOPICALLY LEFT AND RIGHT LEGS;  Surgeon: Hayden Linnie KIDD, MD;  Location: [facility] OR;  Service: Open Heart Surgery;  Laterality: N/A;   deviated septum     JOINT REPLACEMENT     KNEE ARTHROSCOPY     right   LEFT HEART CATH AND CORONARY ANGIOGRAPHY N/A 11/03/2020   Procedure: LEFT HEART CATH AND CORONARY ANGIOGRAPHY;  Surgeon: Deleon Hayden LABOR, MD;  Location: [facility] IN[facility];  Service: Cardiovascular;  Laterality: N/A;   LEFT HEART CATH AND CORS/GRAFTS ANGIOGRAPHY Left 01/05/2024   Procedure: LEFT HEART CATH AND CORS/GRAFTS ANGIOGRAPHY;  Surgeon: Deleon Hayden LABOR, MD;  Location: [facility] CV [facility];  Service: Cardiovascular;  Laterality: Left;   TOTAL SHOULDER ARTHROPLASTY Right 02/03/2018   Procedure: RIGHT REVERSE SHOULDER ARTHROPLASTY;  Surgeon: Hayden Cordella Hamilton, MD;  Location: [facility] OR;  Service: Orthopedics;  Laterality: Right;   WISDOM TOOTH EXTRACTION      Allergies  Allergies[1]     History of Present Illness      69 y.o. y/o male with a history of CAD status post CABG x3  in August 2022, hypertension, hyperlipidemia, COPD, sleep apnea, aortic dilatation, and carotid arterial disease.  Diagnostic catheterization 2003 showed an occluded OM1 with collaterals, as well as a 60 to 70% proximal RCA stenosis.  EF was normal.  He was medically managed.  He has a prolonged history of stable angina with mild chest pain with overexertion.  Stress testing in January 2022 showed a small area of mild ischemia in the inferolateral and apical lateral segments.  EF was 65%.  CT imaging showed coronary calcifications and aortic atherosclerosis.  He was also noted to have bilateral ground glass opacities more focal consolidation versus scarring in the left lower lobe.  Due to progressive symptoms, he  underwent diagnostic catheterization in August 2022 showing an 85% ostial left main stenosis, occluded OM1, 99% proximal and mid RCA stenoses.  He was transferred to [facility] underwent CABG x 3 with a LIMA to the LAD, vein graft to the PDA, and vein graft to the OM 2.  In the setting of unstable angina, he underwent Lexiscan  PET stress testing in September 2025 showing baseline anterior, anterolateral, and anteroseptal reversible ischemia with normal LV function.  He was subsequently seen back in clinic in underwent diagnostic catheterization in early October 2025 showing severe native multivessel coronary artery disease with 3 of 3 patent grafts, and medical therapy was advised.    [name] was doing well at cardiology follow-up in October 2025.  Earlier this month he had routine follow-up for carotid arterial disease with ultrasound showing progression of right internal carotid artery disease, now measured 80 to 99% stenosed.  He will see vascular surgery in early next week, weather permitting.  He notes that he has been doing well over the past 3 months.  He remains active, walking on his treadmill for 20 minutes several days a week and also riding his exercise bicycle for 15 to 20 minutes after that.  He has also been doing some balance exercises and tai chi.  He notes chronic, stable mild chest discomfort that occurs with higher levels of exertion such as walking up an incline or when he adds an incline to his treadmill.  When this occurs, he will step off of his treadmill and symptoms abate within seconds and then he is able to continue exercising.  He has not required sublingual nitroglycerin .  He denies dyspnea, palpitations, PND, orthopnea, dizziness, syncope, edema, or early satiety.  Further, he denies any change in upper or lower extremity strength, sensation, use of facial muscles, speech, or thought processes. Objective   Home Medications    Current Outpatient Medications  Medication Sig  Dispense Refill   albuterol  (VENTOLIN  HFA) 108 (90 Base) MCG/ACT inhaler TAKE 2 PUFFS BY MOUTH EVERY 6 HOURS AS NEEDED FOR WHEEZE OR SHORTNESS OF BREATH 6.7 each 2   amLODipine  (NORVASC ) 2.5 MG tablet Take 1 tablet (2.5 mg total) by mouth daily. 90 tablet 1   aspirin  EC 325 MG EC tablet Take 1 tablet (325 mg total) by mouth daily. 30 tablet 0   atorvastatin  (LIPITOR ) 80 MG tablet TAKE 1 TABLET BY MOUTH EVERY DAY 90 tablet 3   Multiple Vitamin (MULTIVITAMIN) capsule Take 1 capsule by mouth daily.     sertraline  (ZOLOFT ) 50 MG tablet Take 1 tablet (50 mg total) by mouth daily. 90 tablet 1   TRELEGY ELLIPTA  100-62.5-25 MCG/ACT AEPB INHALE 1 PUFF BY MOUTH EVERY DAY 180 each 3   nitroGLYCERIN  (NITROSTAT ) 0.4 MG SL tablet Place 1 tablet (0.4 mg total)  under the tongue every 5 (five) minutes as needed for chest pain. 25 tablet 3   No current facility-administered medications for this visit.     Physical Exam    VS:  BP 136/70   Pulse 73   Ht 5' 11 (1.803 m)   Wt 199 lb 9.6 oz (90.5 kg)   SpO2 (!) 88%   BMI 27.84 kg/m  , BMI Body mass index is 27.84 kg/m.          GEN: Well nourished, well developed, in no acute distress. HEENT: normal. Neck: Supple, no JVD, soft right carotid bruits, or masses. Cardiac: RRR, no murmurs, rubs, or gallops. No clubbing, cyanosis, edema.  Radials 2+/PT 2+ and equal bilaterally.  Respiratory:  Respirations regular and unlabored, clear to auscultation bilaterally. GI: Soft, nontender, nondistended, BS + x 4. MS: no deformity or atrophy. Skin: warm and dry, no rash. Neuro:  Strength and sensation are intact. Psych: Normal affect.  Accessory Clinical Findings     Lab Results  Component Value Date   WBC 10.3 01/01/2024   HGB 16.5 01/01/2024   HCT 48.9 01/01/2024   MCV 98 (H) 01/01/2024   PLT 209 01/01/2024   Lab Results  Component Value Date   CREATININE 0.98 01/01/2024   BUN 13 01/01/2024   NA 139 01/01/2024   K 4.9 01/01/2024   CL 102  01/01/2024   CO2 23 01/01/2024   Lab Results  Component Value Date   ALT 19 08/22/2023   AST 19 08/22/2023   ALKPHOS 67 08/22/2023   BILITOT 1.1 08/22/2023   Lab Results  Component Value Date   CHOL 136 08/22/2023   HDL 53.00 08/22/2023   LDLCALC 58 08/22/2023   LDLDIRECT 71.0 01/03/2016   TRIG 122.0 08/22/2023   CHOLHDL 3 08/22/2023    Lab Results  Component Value Date   HGBA1C 6.0 08/22/2023   Lab Results  Component Value Date   TSH 1.36 12/16/2019       Assessment & Plan    1.  CAD: Status post prior CABG x 3 in 2022 with diagnostic catheterization October 2025 showing 3 of 3 patent grafts with known multivessel disease.  He has been managed medically.  He has chronic, stable mild chest pressure with higher levels of activity such as walking on an incline on his treadmill or up hills, which he notes has been present for several years, and resolves almost immediately with rest.  He does not use as needed nitrates and did not previously tolerate long-acting nitroglycerin .  Overall, he feels that symptoms are stable and not particularly lifestyle-limiting.  He remains on aspirin , statin, and calcium  channel blocker therapy.  2.  Primary hypertension: Blood pressure 136/70 on low-dose amlodipine  therapy.  In setting of severe right internal carotid artery disease, I will not be more aggressive with management at this time.  3.  Hyperlipidemia: LDL of 58 in May 2025 with normal LFTs at that time.  He remains on high potency statin therapy.  4.  Carotid arterial disease: Recent ultrasound in early January with progression of right internal carotid artery stenosis to 80-99% with persistent 40 to 59% left internal carotid artery stenosis.  Soft right-sided bruit on examination.  Denies any neurologic symptoms/deficits.  He is scheduled to see vascular surgery next Monday.  He remains on aspirin  and high potency statin therapy.  5.  Aortic dilatation: 40 mm ascending aorta on August  2022 echo.  Aorta was normal in size on November 2023  echo.  6.  Obstructive sleep apnea: Compliant with CPAP.  7.  Disposition: Follow-up in 6 months or sooner if necessary.  Lonni Meager, NP 04/29/2024, 10:17 AM     [1]  Allergies Allergen Reactions   Avelox  [Moxifloxacin  Hcl In Nacl] Other (See Comments)    Facial swelling   Sulfamethoxazole-Trimethoprim Other (See Comments)    REACTION: sore mouth   Imdur  [Isosorbide  Nitrate] Other (See Comments)    Made pt feel groggy, loopy, bad dreams    "

## 2024-04-29 NOTE — Patient Instructions (Signed)
 Medication Instructions:  Your physician recommends that you continue on your current medications as directed. Please refer to the Current Medication list given to you today.  *If you need a refill on your cardiac medications before your next appointment, please call your pharmacy*  Lab Work: No labs ordered today  If you have labs (blood work) drawn today and your tests are completely normal, you will receive your results only by: MyChart Message (if you have MyChart) OR A paper copy in the mail If you have any lab test that is abnormal or we need to change your treatment, we will call you to review the results.  Testing/Procedures: No test ordered today   Follow-Up: At [facility], you and your health needs are our priority.  As part of our continuing mission to provide you with exceptional heart care, our providers are all part of one team.  This team includes your primary Cardiologist (physician) and Advanced Practice Providers or APPs (Physician Assistants and Nurse Practitioners) who all work together to provide you with the care you need, when you need it.  Your next appointment:  6 month(s)  Provider:  Deatrice Cage, MD  We recommend signing up for the patient portal called MyChart.  Sign up information is provided on this After Visit Summary.  MyChart is used to connect with patients for Virtual Visits (Telemedicine).  Patients are able to view lab/test results, encounter notes, upcoming appointments, etc.  Non-urgent messages can be sent to your provider as well.   To learn more about what you can do with MyChart, go to [email or web address].

## 2024-04-30 NOTE — Progress Notes (Unsigned)
 "                         MRN : [id number]  Hayden Deleon is a 69 y.o. ([birth year]) male who presents with chief complaint of check carotid arteries.  History of Present Illness:   The patient is seen for evaluation of carotid stenosis. The carotid stenosis was identified remotely and has been followed with serial ultrasounds.  This began years ago when he had open heart surgery.  The most recent study is dated April 12, 2024 and shows RICA 80-99% and LICA 40-59%.  The patient denies amaurosis fugax. There is no recent history of TIA symptoms or focal motor deficits. There is no prior documented CVA.  There is no history of migraine headaches. There is no history of seizures.  The patient is taking enteric-coated aspirin  81 mg daily.  No recent shortening of the patient's walking distance or new symptoms consistent with claudication.  No history of rest pain symptoms. No new ulcers or wounds of the lower extremities have occurred.  There is no history of DVT, PE or superficial thrombophlebitis. No recent episodes of angina or shortness of breath documented.   Active Medications[1]  Past Medical History:  Diagnosis Date   Allergy    Anxiety    Arthritis    right shoulder   CAD (coronary artery disease)    a. 2003 Cath: OM1 100%, good collats, RCA 60-70p-->med rx; b. 04/2020 MV: mild ischemia to sm area of mid inflat & apical lat segments. EF >65%; d. 10/2020 Cath: Sev LM/OM1/RCA dzs; e. 10/2020 CABG x 3: LIMA->LAD, VG->RPDA, VG->OM2; f. 12/2023 Cath: LM 85ost, LAD 30p/m, OM1 100, RCA 100, VG->OM3 nl, LIMA->LAD nl, VG->RPDA nl-->Med rx.   Carotid artery disease    a. 03/2020 Carotid U/S: RICA 1-39%, LICA 40-59%; b. 01/2021 U/S: 40-59% bilat ICA stenoses; c. 04/2024 U/S: RICA 80-99%, LICA 40-59%.   COPD (chronic obstructive pulmonary disease) (HCC)    Significant Dr. Shelah   Diastolic dysfunction    a. 03/2018 Echo: EF 60-65%, no rwma, Gr1 DD. Mildly dil LA. Nl RV fxn.   Diastolic  dysfunction    a. 01/2022 Echo: EF 70-75%, GrI DD, nl RV size/fxn, mildly dil RA, triv MR.   Dilation of ascending aorta and aortic root (HCC)    a. 10/2020 Echo: Asc Ao 40mm; b. 01/2022 Echo: Nl Ao size.   Diverticulosis of colon    Emphysema of lung (HCC)    GERD (gastroesophageal reflux disease)    Gout    Hyperlipidemia    Hypertension    LVH (left ventricular hypertrophy)    a. 10/2020 Echo: EF 70-75%, no rwma, mod LVH. Nl RV size/fxn. Mildly dil RA. Triv MR. Asc Ao 40mm.   Prostatitis    Sleep apnea    uses cpap nightly    Past Surgical History:  Procedure Laterality Date   COLONOSCOPY  12/2003   [facility] - polyp   CORONARY ARTERY BYPASS GRAFT N/A 11/05/2020   Procedure: CORONARY ARTERY BYPASS GRAFTING (CABG) TIMES 3, USING LEFT INTERNAL MAMMARY ARTERY AND GREATER SAPHEOUS VEIN HARVESTED ENDOSCOPICALLY LEFT AND RIGHT LEGS;  Surgeon: Shyrl Linnie KIDD, MD;  Location: [facility] OR;  Service: Open Heart Surgery;  Laterality: N/A;   deviated septum     JOINT REPLACEMENT     KNEE ARTHROSCOPY     right   LEFT HEART CATH AND CORONARY ANGIOGRAPHY N/A 11/03/2020   Procedure: LEFT HEART CATH AND CORONARY  ANGIOGRAPHY;  Surgeon: Darron Deatrice LABOR, MD;  Location: [facility];  Service: Cardiovascular;  Laterality: N/A;   LEFT HEART CATH AND CORS/GRAFTS ANGIOGRAPHY Left 01/05/2024   Procedure: LEFT HEART CATH AND CORS/GRAFTS ANGIOGRAPHY;  Surgeon: Darron Deatrice LABOR, MD;  Location: [facility];  Service: Cardiovascular;  Laterality: Left;   TOTAL SHOULDER ARTHROPLASTY Right 02/03/2018   Procedure: RIGHT REVERSE SHOULDER ARTHROPLASTY;  Surgeon: Addie Cordella Hamilton, MD;  Location[facility];  Service: Orthopedics;  Laterality: Right;   WISDOM TOOTH EXTRACTION      Social History Social History[2]  Family History Family History  Problem Relation Age of Onset   Angina Mother        car accident   Asthma Father    Crohn's disease Sister    Colon cancer Neg Hx    Esophageal  cancer Neg Hx    Rectal cancer Neg Hx    Stomach cancer Neg Hx     Allergies[3]   REVIEW OF SYSTEMS (Negative unless checked)  Constitutional: [] Weight loss  [] Fever  [] Chills Cardiac: [] Chest pain   [] Chest pressure   [] Palpitations   [] Shortness of breath when laying flat   [] Shortness of breath with exertion. Vascular:  [x] Pain in legs with walking   [] Pain in legs at rest  [] History of DVT   [] Phlebitis   [] Swelling in legs   [] Varicose veins   [] Non-healing ulcers Pulmonary:   [] Uses home oxygen    [] Productive cough   [] Hemoptysis   [] Wheeze  [] COPD   [] Asthma Neurologic:  [] Dizziness   [] Seizures   [] History of stroke   [] History of TIA  [] Aphasia   [] Vissual changes   [] Weakness or numbness in arm   [] Weakness or numbness in leg Musculoskeletal:   [] Joint swelling   [] Joint pain   [] Low back pain Hematologic:  [] Easy bruising  [] Easy bleeding   [] Hypercoagulable state   [] Anemic Gastrointestinal:  [] Diarrhea   [] Vomiting  [] Gastroesophageal reflux/heartburn   [] Difficulty swallowing. Genitourinary:  [] Chronic kidney disease   [] Difficult urination  [] Frequent urination   [] Blood in urine Skin:  [] Rashes   [] Ulcers  Psychological:  [] History of anxiety   []  History of major depression.  Physical Examination  There were no vitals filed for this visit. There is no height or weight on file to calculate BMI. Gen: WD/WN, NAD Head: [place]/AT, No temporalis wasting.  Ear/Nose/Throat: Hearing grossly intact, nares w/o erythema or drainage Eyes: PER, EOMI, sclera nonicteric.  Neck: Supple, no masses.  No bruit or JVD.  Pulmonary:  Good air movement, no audible wheezing, no use of accessory muscles.  Cardiac: RRR, normal S1, S2, no Murmurs. Vascular:  carotid bruit noted Vessel Right Left  Radial Palpable Palpable  Carotid  Palpable  Palpable  Gastrointestinal: soft, non-distended. No guarding/no peritoneal signs.  Musculoskeletal: M/S 5/5 throughout.  No visible deformity.   Neurologic: CN 2-12 intact. Pain and light touch intact in extremities.  Symmetrical.  Speech is fluent. Motor exam as listed above. Psychiatric: Judgment intact, Mood & affect appropriate for pt's clinical situation. Dermatologic: No rashes or ulcers noted.  No changes consistent with cellulitis.   CBC Lab Results  Component Value Date   WBC 10.3 01/01/2024   HGB 16.5 01/01/2024   HCT 48.9 01/01/2024   MCV 98 (H) 01/01/2024   PLT 209 01/01/2024    BMET    Component Value Date/Time   NA 139 01/01/2024 1215   K 4.9 01/01/2024 1215   CL 102 01/01/2024 1215  CO2 23 01/01/2024 1215   GLUCOSE 81 01/01/2024 1215   GLUCOSE 78 08/22/2023 0943   BUN 13 01/01/2024 1215   CREATININE 0.98 01/01/2024 1215   CALCIUM  9.7 01/01/2024 1215   GFRNONAA >60 11/10/2020 0105   GFRAA >60 01/26/2018 0845   CrCl cannot be calculated (Patient's most recent lab result is older than the maximum 21 days allowed.).  COAG Lab Results  Component Value Date   INR 1.4 (H) 11/05/2020   INR 1.0 11/04/2020    Radiology VAS US  CAROTID Result Date: 04/13/2024 Carotid Arterial Duplex Study Patient Name:  Kingdom Vanzanten  Date of Exam:   04/12/2024 Medical Rec #: [id number]          Accession #:    [id number] Date of Birth: [birth year]           Patient Gender: M Patient Age:   14 years Exam Location:  [address] Procedure:      VAS US  CAROTID Referring Phys: DEATRICE ARIDA --------------------------------------------------------------------------------  Indications:       Bilateral carotid artery stenosis. Risk Factors:      Hypertension, hyperlipidemia, past history of smoking,                    coronary artery disease. Comparison Study:  03/28/23                    Right Carotid: Velocities in the right ICA are consistent                    with a 40-59%                    stenosis.                    The ECA appears >50% stenosed.                     Left Carotid: Velocities in the left ICA are consistent  with                    a 40-59%                    stenosis.                     The ECA appears >50% stenosed.                     Vertebrals: Bilateral vertebral arteries demonstrate                    antegrade flow.                    Subclavians: Bilateral subclavian artery flow was disturbed. Performing Technologist: Dena Pane  Examination Guidelines: A complete evaluation includes B-mode imaging, spectral Doppler, color Doppler, and power Doppler as needed of all accessible portions of each vessel. Bilateral testing is considered an integral part of a complete examination. Limited examinations for reoccurring indications may be performed as noted.  Right Carotid Findings: +----------+--------+--------+--------+-------------------------+--------+           PSV cm/sEDV cm/sStenosisPlaque Description       Comments +----------+--------+--------+--------+-------------------------+--------+ CCA Prox  58      14      <50%    heterogenous                      +----------+--------+--------+--------+-------------------------+--------+  CCA Distal110     26      <50%    heterogenous                      +----------+--------+--------+--------+-------------------------+--------+ ICA Prox  617     133     80-99%  calcific and heterogenous         +----------+--------+--------+--------+-------------------------+--------+ ICA Mid   105     20                                                +----------+--------+--------+--------+-------------------------+--------+ ICA Distal51      17                                                +----------+--------+--------+--------+-------------------------+--------+ ECA       170     22              calcific and heterogenous         +----------+--------+--------+--------+-------------------------+--------+ +----------+--------+-------+---------+-------------------+           PSV cm/sEDV cmsDescribe Arm Pressure (mmHG)  +----------+--------+-------+---------+-------------------+ Subclavian229     0      Turbulent147                 +----------+--------+-------+---------+-------------------+ +---------+--------+--+--------+--+---------+ VertebralPSV cm/s90EDV cm/s18Antegrade +---------+--------+--+--------+--+---------+ Technically difficut to differentiate ICA from the ECA due to calcification. Left Carotid Findings: +----------+--------+--------+--------+-------------------------+--------+           PSV cm/sEDV cm/sStenosisPlaque Description       Comments +----------+--------+--------+--------+-------------------------+--------+ CCA Prox  41      10      <50%    heterogenous                      +----------+--------+--------+--------+-------------------------+--------+ CCA Distal85      18      <50%    heterogenous                      +----------+--------+--------+--------+-------------------------+--------+ ICA Prox  358     49      40-59%  heterogenous and calcific         +----------+--------+--------+--------+-------------------------+--------+ ICA Mid   68      14                                                +----------+--------+--------+--------+-------------------------+--------+ ICA Distal49      11                                                +----------+--------+--------+--------+-------------------------+--------+ ECA       168     23              heterogenous and calcific         +----------+--------+--------+--------+-------------------------+--------+ +----------+--------+--------+---------+-------------------+           PSV cm/sEDV cm/sDescribe Arm Pressure (mmHG) +----------+--------+--------+---------+-------------------+ [id number]     0  Turbulent131                 +----------+--------+--------+---------+-------------------+ +---------+--------+--------+------+ VertebralPSV cm/sEDV cm/sAbsent  +---------+--------+--------+------+ Technically difficut to differentiate ICA from the ECA due to calcification.  Summary: Right Carotid: Velocities in the right ICA are consistent with a 80-99%                stenosis. Non-hemodynamically significant plaque <50% noted in                the CCA. Technically difficut to differentiate ICA from the ECA                due to calcification. Unable to duplicate previous ECA velocity. Left Carotid: Velocities in the left ICA are consistent with a 40-59% stenosis.               Non-hemodynamically significant plaque <50% noted in the CCA.               Technically difficut to differentiate ICA from the ECA due to               calcification. Unable to duplicate previous ECA velocity. Vertebrals:  Right vertebral artery demonstrates antegrade flow. The left              vertebral artery is not well visualized. Subclavians: Bilateral subclavian artery flow was disturbed. *See table(s) above for measurements and observations.  Electronically signed by Dorn Lesches MD on 04/13/2024 at 4:02:06 PM.    Final      Assessment/Plan 1. Bilateral carotid artery stenosis (Primary) Recommend:  The patient remains asymptomatic with respect to the carotid stenosis.  However, the patient has now progressed and has a lesion the is >75%.  Patient should undergo CT angiography of the carotid arteries to define the degree of stenosis of the internal carotid arteries bilaterally and the anatomic suitability for surgery.  If the patient does indeed need surgery cardiac clearance will be required, once cleared the patient will be scheduled for surgery.  The risks, benefits and alternative therapies were reviewed in detail with the patient.  All questions were answered.  The patient agrees to proceed with imaging.  Continue antiplatelet therapy as prescribed. Continue management of CAD, HTN and Hyperlipidemia. Healthy heart diet, encouraged exercise at least 4 times per week.   - CT ANGIO NECK W OR WO CONTRAST; Future  2. Coronary artery disease of native artery of native heart with stable angina pectoris Continue cardiac and antihypertensive medications as already ordered and reviewed, no changes at this time.  Continue statin as ordered and reviewed, no changes at this time  Nitrates PRN for chest pain  3. Primary hypertension Continue antihypertensive medications as already ordered, these medications have been reviewed and there are no changes at this time.  4. COPD (chronic obstructive pulmonary disease) with chronic bronchitis (HCC) Continue pulmonary medications and aerosols as already ordered, these medications have been reviewed and there are no changes at this time.   5. Hyperlipidemia, unspecified hyperlipidemia type Continue statin as ordered and reviewed, no changes at this time    Cordella Shawl, MD  04/30/2024 10:51 AM      [1]  No outpatient medications have been marked as taking for the 05/03/24 encounter (Appointment) with Shawl, Cordella MATSU, MD.  [2]  Social History Tobacco Use   Smoking status: Former    Current packs/day: 0.00    Average packs/day: 2.0 packs/day for 32.0 years (64.0 ttl pk-yrs)    Types:  Cigarettes    Start date: 08/31/1967    Quit date: 08/31/1999    Years since quitting: 24.6   Smokeless tobacco: Former  Building Services Engineer status: Never Used  Substance Use Topics   Alcohol use: No    Alcohol/week: 0.0 standard drinks of alcohol   Drug use: No  [3]  Allergies Allergen Reactions   Avelox  [Moxifloxacin  Hcl In Nacl] Other (See Comments)    Facial swelling   Sulfamethoxazole-Trimethoprim Other (See Comments)    REACTION: sore mouth   Imdur  [Isosorbide  Nitrate] Other (See Comments)    Made pt feel groggy, loopy, bad dreams    "

## 2024-05-03 ENCOUNTER — Ambulatory Visit (INDEPENDENT_AMBULATORY_CARE_PROVIDER_SITE_OTHER): Payer: Self-pay | Admitting: Vascular Surgery

## 2024-05-03 ENCOUNTER — Encounter (INDEPENDENT_AMBULATORY_CARE_PROVIDER_SITE_OTHER): Payer: Self-pay | Admitting: Vascular Surgery

## 2024-05-03 VITALS — BP 141/68 | HR 74 | Resp 18 | Ht 71.0 in | Wt 199.0 lb

## 2024-05-03 DIAGNOSIS — I10 Essential (primary) hypertension: Secondary | ICD-10-CM | POA: Diagnosis not present

## 2024-05-03 DIAGNOSIS — I25118 Atherosclerotic heart disease of native coronary artery with other forms of angina pectoris: Secondary | ICD-10-CM

## 2024-05-03 DIAGNOSIS — I6523 Occlusion and stenosis of bilateral carotid arteries: Secondary | ICD-10-CM | POA: Diagnosis not present

## 2024-05-03 DIAGNOSIS — E785 Hyperlipidemia, unspecified: Secondary | ICD-10-CM | POA: Diagnosis not present

## 2024-05-03 DIAGNOSIS — J4489 Other specified chronic obstructive pulmonary disease: Secondary | ICD-10-CM

## 2024-05-10 ENCOUNTER — Ambulatory Visit
